# Patient Record
Sex: Female | Born: 1935 | Race: White | Hispanic: No | Smoking: Never smoker
Health system: Southern US, Community
[De-identification: ages and names within clinical notes are randomized; demographics above are authoritative.]

## PROBLEM LIST (undated history)

## (undated) DIAGNOSIS — R3915 Urgency of urination: Secondary | ICD-10-CM

## (undated) DIAGNOSIS — E079 Disorder of thyroid, unspecified: Secondary | ICD-10-CM

## (undated) DIAGNOSIS — M810 Age-related osteoporosis without current pathological fracture: Secondary | ICD-10-CM

## (undated) DIAGNOSIS — C801 Malignant (primary) neoplasm, unspecified: Secondary | ICD-10-CM

---

## 1995-11-29 HISTORY — PX: APPENDECTOMY: SHX54

## 1998-07-15 ENCOUNTER — Other Ambulatory Visit: Admission: RE | Admit: 1998-07-15 | Discharge: 1998-07-15 | Payer: Self-pay | Admitting: Obstetrics and Gynecology

## 1998-09-23 ENCOUNTER — Other Ambulatory Visit: Admission: RE | Admit: 1998-09-23 | Discharge: 1998-09-23 | Payer: Self-pay | Admitting: Obstetrics and Gynecology

## 1998-11-03 ENCOUNTER — Other Ambulatory Visit: Admission: RE | Admit: 1998-11-03 | Discharge: 1998-11-03 | Payer: Self-pay | Admitting: *Deleted

## 1998-11-28 DIAGNOSIS — C801 Malignant (primary) neoplasm, unspecified: Secondary | ICD-10-CM

## 1998-11-28 HISTORY — PX: TOTAL THYROIDECTOMY: SHX2547

## 1998-11-28 HISTORY — DX: Malignant (primary) neoplasm, unspecified: C80.1

## 1998-12-03 ENCOUNTER — Other Ambulatory Visit: Admission: RE | Admit: 1998-12-03 | Discharge: 1998-12-03 | Payer: Self-pay | Admitting: Endocrinology

## 1998-12-31 ENCOUNTER — Inpatient Hospital Stay (HOSPITAL_COMMUNITY): Admission: RE | Admit: 1998-12-31 | Discharge: 1999-01-02 | Payer: Self-pay | Admitting: Obstetrics and Gynecology

## 1999-03-01 ENCOUNTER — Ambulatory Visit (HOSPITAL_COMMUNITY): Admission: RE | Admit: 1999-03-01 | Discharge: 1999-03-01 | Payer: Self-pay | Admitting: Endocrinology

## 1999-03-05 ENCOUNTER — Encounter: Payer: Self-pay | Admitting: Endocrinology

## 1999-03-05 ENCOUNTER — Ambulatory Visit (HOSPITAL_COMMUNITY): Admission: RE | Admit: 1999-03-05 | Discharge: 1999-03-05 | Payer: Self-pay | Admitting: Endocrinology

## 1999-04-13 ENCOUNTER — Ambulatory Visit (HOSPITAL_COMMUNITY): Admission: RE | Admit: 1999-04-13 | Discharge: 1999-04-13 | Payer: Self-pay | Admitting: Endocrinology

## 1999-04-13 ENCOUNTER — Encounter: Payer: Self-pay | Admitting: Endocrinology

## 2001-11-07 ENCOUNTER — Other Ambulatory Visit: Admission: RE | Admit: 2001-11-07 | Discharge: 2001-11-07 | Payer: Self-pay | Admitting: Obstetrics and Gynecology

## 2002-11-14 ENCOUNTER — Other Ambulatory Visit: Admission: RE | Admit: 2002-11-14 | Discharge: 2002-11-14 | Payer: Self-pay | Admitting: Obstetrics and Gynecology

## 2003-12-04 ENCOUNTER — Other Ambulatory Visit: Admission: RE | Admit: 2003-12-04 | Discharge: 2003-12-04 | Payer: Self-pay | Admitting: Obstetrics and Gynecology

## 2004-05-11 ENCOUNTER — Ambulatory Visit (HOSPITAL_COMMUNITY): Admission: RE | Admit: 2004-05-11 | Discharge: 2004-05-11 | Payer: Self-pay | Admitting: Internal Medicine

## 2004-12-15 ENCOUNTER — Ambulatory Visit: Payer: Self-pay | Admitting: Internal Medicine

## 2005-01-27 ENCOUNTER — Ambulatory Visit: Payer: Self-pay | Admitting: Internal Medicine

## 2005-09-23 ENCOUNTER — Ambulatory Visit: Payer: Self-pay | Admitting: Internal Medicine

## 2006-01-16 ENCOUNTER — Other Ambulatory Visit: Admission: RE | Admit: 2006-01-16 | Discharge: 2006-01-16 | Payer: Self-pay | Admitting: Obstetrics and Gynecology

## 2006-08-24 ENCOUNTER — Ambulatory Visit: Payer: Self-pay | Admitting: Internal Medicine

## 2008-10-11 ENCOUNTER — Ambulatory Visit (HOSPITAL_COMMUNITY): Admission: RE | Admit: 2008-10-11 | Discharge: 2008-10-11 | Payer: Self-pay | Admitting: Orthopedic Surgery

## 2008-10-11 ENCOUNTER — Ambulatory Visit: Payer: Self-pay | Admitting: *Deleted

## 2008-10-11 ENCOUNTER — Encounter (INDEPENDENT_AMBULATORY_CARE_PROVIDER_SITE_OTHER): Payer: Self-pay | Admitting: Orthopedic Surgery

## 2009-07-21 ENCOUNTER — Ambulatory Visit: Payer: Self-pay | Admitting: Vascular Surgery

## 2009-12-30 DIAGNOSIS — E785 Hyperlipidemia, unspecified: Secondary | ICD-10-CM | POA: Diagnosis present

## 2009-12-30 DIAGNOSIS — C73 Malignant neoplasm of thyroid gland: Secondary | ICD-10-CM | POA: Insufficient documentation

## 2009-12-30 DIAGNOSIS — M810 Age-related osteoporosis without current pathological fracture: Secondary | ICD-10-CM | POA: Insufficient documentation

## 2009-12-30 DIAGNOSIS — E539 Vitamin B deficiency, unspecified: Secondary | ICD-10-CM | POA: Insufficient documentation

## 2010-01-19 DIAGNOSIS — E559 Vitamin D deficiency, unspecified: Secondary | ICD-10-CM | POA: Insufficient documentation

## 2010-07-26 DIAGNOSIS — G4700 Insomnia, unspecified: Secondary | ICD-10-CM | POA: Insufficient documentation

## 2011-04-12 NOTE — Procedures (Signed)
LOWER EXTREMITY VENOUS REFLUX EXAM   INDICATION:  Right lower extremity varicose vein with pain and swelling.   EXAM:  Using color-flow imaging and pulse Doppler spectral analysis, the  right common femoral vein, superficial femoral, popliteal, posterior  tibial, greater and lesser saphenous veins are evaluated.  There is  evidence suggesting deep venous insufficiency in the right lower  extremity.   The right  saphenofemoral junction is not competent.  The right  GSV is  not competent with the caliber as described below.   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   GSV Diameter (used if found to be incompetent only)                                            Right    Left  Proximal Greater Saphenous Vein           0.45. cm cm  Proximal-to-mid-thigh                     0.45 cm  cm  Mid thigh                                 0.48 cm  cm  Mid-distal thigh                          0.48 cm  cm  Distal thigh                              0.44 cm  cm  Knee                                      0.61 cm  cm   IMPRESSION:  1. Right  greater saphenous vein reflux is identified with the caliber      ranging from 0.61 cm to 0.66 cm knee to groin.  2. The right  greater saphenous vein is not aneurysmal.  3. The right/left greater saphenous vein is not tortuous.  4. The deep venous system is not competent.  5. 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  6. No evidence of deep venous thrombosis noted in the right leg.       ___________________________________________  Quita Skye. Hart Rochester, M.D.   MG/MEDQ  D:  07/21/2009  T:  07/21/2009  Job:  [id number]

## 2011-04-12 NOTE — Consult Note (Signed)
NEW PATIENT CONSULTATION   Jackie [name]  DOB:  Jul 20, 1932                                       07/21/2009  ZOXWR#:[id number]   The patient is a 77 year old female patient referred by Dr. Merla Riches  for painful varicosities in the right leg.  This lady has had prominent  varicosities for many years and has been having increasing  symptomatology in her right leg beginning in the hip and extending down  into the thigh and calf over the last several months.  She has been  treated for sciatica by Dr. Darrelyn Hillock with prednisone with some  improvement but continues to have a tight, throbbing, hot sensation in  the thigh and calf which worsens as the day progresses.  She has no  history of stasis ulcers, bleeding, thrombophlebitis or deep venous  thrombosis.  She does not wear elastic compression stockings nor elevate  the legs nor take pain medicine for this problem.   PAST MEDICAL HISTORY:  1. Hypertension.  2. Previous thyroid cancer treated by thyroidectomy.  3. Negative for diabetes, coronary artery disease, COPD,      hyperlipidemia or stroke.   PAST SURGICAL HISTORY:  1. Thyroidectomy.  2. Hysterectomy.  3. Bladder tack.   FAMILY HISTORY:  Positive for coronary artery disease and diabetes in a  brother.  Negative for stroke.   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  Does  not use tobacco or alcohol.   REVIEW OF SYSTEMS:  Please see health history form.   ALLERGY:  To erythromycin which causes nausea.   MEDICATIONS:  Please see health history form.   PHYSICAL EXAMINATION:  Vital signs:  Blood pressure is 130/80, heart  rate 70, respirations 14.  General:  She is a healthy-appearing female  in no apparent distress, alert and oriented x3.  Neck:  Is supple, 3+  carotid pulses palpable.  No bruits are audible.  Neurological:  Normal.  No palpable adenopathy in the neck.  Chest:  Clear to auscultation.  Cardiovascular:   Regular rhythm.  No murmurs.  Abdomen:  Soft, nontender  with no mass.  She has 3+ femoral, popliteal and posterior tibial pulses  bilaterally.  Right leg has severe varicosities beginning in the medial  thigh extending down into the medial calf and down into the ankle  medially and laterally with some thickening of the scan.  No edema is  noted.  No active ulcers are noted.  Left leg has some varicosities in  the lateral thigh which are much less severe extending down to the  knees.   Venous duplex exam reveals gross reflux of the right saphenofemoral  junction extending to the knee in the great saphenous vein with the deep  system being normal.   I think she is having painful varicosities from venous hypertension  secondary to valvular reflux in the right great saphenous vein.  We will  treat her with 3 months of long leg elastic compression (20 - 30 mm)  plus elevation of the legs as much as possible and ibuprofen on a daily  basis.  If she has had no improvement in 3 months I think she would  benefit from laser ablation of her right great saphenous vein and  multiple stab phlebectomies.  She will return in 3 months for her  followup.  Jackie [name], [name].D.  Electronically Signed   JDL/MEDQ  D:  07/21/2009  T:  07/22/2009  Job:  [id number]   cc:   Harrel Lemon. Merla Riches, [name].D.  Tera Mater. Evlyn Kanner, [name].D.

## 2011-04-15 NOTE — Assessment & Plan Note (Signed)
[facility]                               PULMONARY OFFICE NOTE   NAME:Borton, KARISSA MEENAN             MRN:          [id number]  DATE:01/27/2005                            DOB:          Mar 28, 1932    PROBLEM:  1. Allergic rhinitis.  2. Bronchiectasis.  3. Positive ANA.  4. Thyroid cancer surgery.   HISTORY:  One year followup for this never smoker who says she had 1 episode  of bronchitis during the past winter.  Dr. Evlyn Kanner did a chest x-ray in July  and mentioned a stable nodule.  We reviewed the film report here from  09/23/2005 which describes stable diffuse peribronchial thickening with  particular scarring in the region of the left lingula and right middle lobe.  There was no specific mention of a nodule.  She is going to check with Dr.  Evlyn Kanner.  She takes TUMS before bed but usually feels no reflux.  She did have  1 episode of bad heartburn 3 weeks ago and I discussed reflux precautions in  the role of reflux and lung disease.  She walks regularly and denies routine  productive cough, chest pain or fever.   MEDICATION:  1. Synthroid 125.  2. Vytorin 10/20.  3. Fosamax 70 mg.  4. Caltrate with vitamin D.   Drug intolerance to ERYTHROMYCIN.   OBJECTIVE:  Weight 148 pounds.  Blood pressure 128/82.  Pulse regular 64.  Room air saturation 98%.  I find no adenopathy. No neck vein distention or peripheral edema.  No  clubbing or cyanosis.  Her chest is quiet.  I specifically do not hear rales, rhonchi, or any  congestion today.  HEART:  Sounds are regular without murmur.   IMPRESSION:  There is some chronic bronchitis and we have felt clinically in  the past that she had bronchiectasis, but she seems stable now.  I am  concerned if she is having enough reflux to recognize and I explained how  this might aggravate her lung disease.   PLAN:  1. Reflux precautions.  2. I have asked her to see if we can compare Dr. Rinaldo Cloud chest x-ray  with      the one done here      last year.  3. Schedule return in 1 year at her request, earlier as needed.       Clinton D. Maple Hudson, MD, FCCP, FACP      CDY/MedQ  DD:  08/27/2006  DT:  08/28/2006  Job #:  [id number]   cc:   Tera Mater. Evlyn Kanner, M.D.

## 2014-03-09 ENCOUNTER — Emergency Department (HOSPITAL_BASED_OUTPATIENT_CLINIC_OR_DEPARTMENT_OTHER): Payer: Medicare Other

## 2014-03-09 ENCOUNTER — Encounter (HOSPITAL_BASED_OUTPATIENT_CLINIC_OR_DEPARTMENT_OTHER): Payer: Self-pay | Admitting: Emergency Medicine

## 2014-03-09 ENCOUNTER — Emergency Department (HOSPITAL_BASED_OUTPATIENT_CLINIC_OR_DEPARTMENT_OTHER)
Admission: EM | Admit: 2014-03-09 | Discharge: 2014-03-10 | Disposition: A | Discharge status: Home / Self Care | Payer: Medicare Other | Attending: Emergency Medicine | Admitting: Emergency Medicine

## 2014-03-09 DIAGNOSIS — Y92009 Unspecified place in unspecified non-institutional (private) residence as the place of occurrence of the external cause: Secondary | ICD-10-CM | POA: Insufficient documentation

## 2014-03-09 DIAGNOSIS — Z859 Personal history of malignant neoplasm, unspecified: Secondary | ICD-10-CM | POA: Insufficient documentation

## 2014-03-09 DIAGNOSIS — S22009A Unspecified fracture of unspecified thoracic vertebra, initial encounter for closed fracture: Secondary | ICD-10-CM | POA: Insufficient documentation

## 2014-03-09 DIAGNOSIS — Y93E5 Activity, floor mopping and cleaning: Secondary | ICD-10-CM | POA: Insufficient documentation

## 2014-03-09 DIAGNOSIS — E079 Disorder of thyroid, unspecified: Secondary | ICD-10-CM | POA: Insufficient documentation

## 2014-03-09 DIAGNOSIS — W010XXA Fall on same level from slipping, tripping and stumbling without subsequent striking against object, initial encounter: Secondary | ICD-10-CM | POA: Insufficient documentation

## 2014-03-09 DIAGNOSIS — G319 Degenerative disease of nervous system, unspecified: Secondary | ICD-10-CM | POA: Insufficient documentation

## 2014-03-09 DIAGNOSIS — Z79899 Other long term (current) drug therapy: Secondary | ICD-10-CM | POA: Insufficient documentation

## 2014-03-09 DIAGNOSIS — Z7982 Long term (current) use of aspirin: Secondary | ICD-10-CM | POA: Insufficient documentation

## 2014-03-09 HISTORY — DX: Disorder of thyroid, unspecified: E07.9

## 2014-03-09 HISTORY — DX: Malignant (primary) neoplasm, unspecified: C80.1

## 2014-03-09 NOTE — ED Notes (Addendum)
Pt reports she tripped and fell while sweeping floor pt c/o left shoulder and rib area pain

## 2014-03-09 NOTE — ED Provider Notes (Signed)
CSN: [id number]     Arrival date & time 03/09/14  2242 History  This chart was scribed for Mattea Seger Alfonso Patten, MD by Marcha Dutton, ED Scribe. This patient was seen in room MH04/MH04 and the patient's care was started at 12:13 AM.    Chief Complaint  Patient presents with  . Fall      Patient is a 80 y.o. female presenting with fall. The history is provided by the patient. No language interpreter was used.  Fall This is a new problem. The current episode started 3 to 5 hours ago. The problem occurs constantly. The problem has not changed since onset.Pertinent negatives include no chest pain, no abdominal pain, no headaches and no shortness of breath. Nothing aggravates the symptoms. Nothing relieves the symptoms. Treatments tried: ibuprofen. The treatment provided mild relief.   HPI Comments: Jackie [name] is a 80 y.o. female who presents to the Emergency Department complaining of left shoulder pain after a fall while she was cleaning her floor this evening 5 hours ago. She states she impacted her right  Back lateral to her spine at. Pt reports she took two advil to relieve her pain which is mildly improving.  Past Medical History  Diagnosis Date  . Thyroid disease   . Cancer    Past Surgical History  Procedure Laterality Date  . Total thyroidectomy     History reviewed. No pertinent family history. History  Substance Use Topics  . Smoking status: Never Smoker   . Smokeless tobacco: Not on file  . Alcohol Use: No   OB History   Grav Para Term Preterm Abortions TAB SAB Ect Mult Living                 Review of Systems  Respiratory: Negative for shortness of breath.   Cardiovascular: Negative for chest pain.  Gastrointestinal: Negative for abdominal pain.  Musculoskeletal: Positive for arthralgias (right shoulder).  Neurological: Negative for headaches.  All other systems reviewed and are negative.     Allergies  Zithromax  Home Medications   Current  Outpatient Rx  Name  Route  Sig  Dispense  Refill  . aspirin 81 MG tablet   Oral   Take 81 mg by mouth daily.         Marland Kitchen levothyroxine (SYNTHROID, LEVOTHROID) 112 MCG tablet   Oral   Take 112 mcg by mouth daily before breakfast.         . Multiple Vitamins-Minerals (MULTIVITAMIN WITH MINERALS) tablet   Oral   Take 1 tablet by mouth daily.         . rosuvastatin (CRESTOR) 5 MG tablet   Oral   Take 5 mg by mouth daily.          Triage Vitals: BP 193/86  Pulse 80  Temp(Src) 97.8 F (36.6 C) (Oral)  Resp 20  Ht 5' 3.25" (1.607 m)  Wt 125 lb (56.7 kg)  BMI 21.96 kg/m2  SpO2 98%  Physical Exam  Nursing note and vitals reviewed. Constitutional: She is oriented to person, place, and time. She appears well-developed and well-nourished. No distress.  HENT:  Head: Normocephalic and atraumatic. Head is without raccoon's eyes and without Battle's sign.  Right Ear: Hearing, tympanic membrane and external ear normal. No hemotympanum.  Left Ear: Hearing, tympanic membrane and external ear normal. No hemotympanum.  Mouth/Throat: Uvula is midline, oropharynx is clear and moist and mucous membranes are normal. Mucous membranes are not dry.  Eyes: Conjunctivae are  normal. Pupils are equal, round, and reactive to light. Right eye exhibits no discharge. Left eye exhibits no discharge. No scleral icterus.  Neck: Normal range of motion. Neck supple. No tracheal deviation present. No thyromegaly present.  Cardiovascular: Normal rate, regular rhythm and intact distal pulses.   Pulmonary/Chest: Effort normal and breath sounds normal. No stridor. No respiratory distress. She has no wheezes. She has no rales. She exhibits no crepitus.  Abdominal: Soft. Bowel sounds are normal. She exhibits no distension. There is no tenderness. There is no rebound and no guarding.  Musculoskeletal: Normal range of motion. She exhibits no edema and no tenderness.  Hematoma by the right scapula, no winging of the  scapula.  no step offs, no crepitance, or tenderness of the c t or lpsine.  shoulders are seated in the glenoids.  Intact sensation to all extremities.  Lymphadenopathy:    She has no cervical adenopathy.  Neurological: She is alert and oriented to person, place, and time. She has normal strength and normal reflexes. No cranial nerve deficit (no facial droop, extraocular movements intact, no slurred speech) or sensory deficit. She exhibits normal muscle tone. She displays no seizure activity. Coordination normal.  No pint tenderness of the CTor L spine.  5/5 x all 4 extremities  Skin: Skin is warm and dry. No rash noted.  Psychiatric: She has a normal mood and affect.    ED Course  Procedures (including critical care time)  DIAGNOSTIC STUDIES: Oxygen Saturation is 98% on RA, normal by my interpretation.    COORDINATION OF CARE: 12:18 AM- Pt advised of plan for treatment and pt agrees.    Labs Review Labs Reviewed - No data to display Imaging Review No results found.   EKG Interpretation None      MDM   Final diagnoses:  None   130 am case d/w Dr. Vertell Limber via phone who reviewed all films.  Please place in vista collar and refer to office to be seen early this week.  No additional films necessary at this time  Collar placed.  Neuro intact pre and post placement.  No driving.  No removing collar. No lifting, no bending.  Take pain medication return to the closest ED immediately for weakness numbness or any concerns. All to be seen by Dr. Vertell Limber.  > 15 minutes spent answering questions for patient and family.  Patient and children verbalize understanding of all instructions and agree to follow up  I personally performed the services described in this documentation, which was scribed in my presence. The recorded information has been reviewed and is accurate.    Carlisle Beers, MD 03/10/14 [id number]

## 2014-03-10 ENCOUNTER — Encounter (HOSPITAL_BASED_OUTPATIENT_CLINIC_OR_DEPARTMENT_OTHER): Payer: Self-pay | Admitting: Emergency Medicine

## 2014-03-10 MED ORDER — TRAMADOL HCL 50 MG PO TABS: 50.0000 mg | ORAL_TABLET | Freq: Four times a day (QID) | ORAL | Status: DC | PRN

## 2014-03-10 MED ORDER — ACETAMINOPHEN 500 MG PO TABS
1000.0000 mg | ORAL_TABLET | Freq: Once | ORAL | Status: AC
  Administered 2014-03-10: 1000 mg via ORAL
  Filled 2014-03-10: qty 2

## 2014-03-10 NOTE — Discharge Instructions (Signed)
Transverse Process Fracture A fracture of a bone is the same as a break in the bone. A fracture of a transverse process is a break of a part of one of the bones in the spine. This part extends out from the side of the main body of the bone (called the vertebral body). A transverse process is shaped like a wing. They extend from both the left and right sides of the vertebral body. Many of these injuries occur in the thoracic spine (the upper and middle parts of the back) and the lumbar spine (the low back area). In the elderly, these injuries can also occur in the lower neck area and are affected by age-related arthritis problems and osteoporosis (thinning of bone). It takes a lot of force to cause this type of fracture. Because other organs and so many other parts of the spine are close to the transverse processes, these fractures usually occur at the same time as injuries to:  Other bones.  Organs.  Possibly the spinal cord. Even if there is only a break to one transverse process, your caregiver will take steps to make sure that a nearby organ has not also been injured.  CAUSES  Most of these injuries occur as a result of a variety of accidents such as:  Falls.  Motor vehicle accidents.  Recreational activities.  A smaller number occur due to:  Industrial, Engineer, maintenance, and aviation accidents.  Gunshot wounds and direct blows to the back.  Parachuting incidents. SYMPTOMS  Patients with transverse process fractures have severe pain even if the actual break is small or limited and there is no injury to nearby bones, organs, or the spinal cord. More severe or complex injuries involving other bones and/or organs may include:  Deformity of the back bones.  Swelling/bruising over the injured area.  Limited ability to move the affected area.  There may be injury to a nearby:  Lung.  Kidney.  Spleen.  Liver. Injury to nearby nerves can cause partial or complete loss of  function of the bladder and/or bowels. More severe injuries can also cause:  Loss of sensation and/or strength in the arms/hands (if the break is in the lower part of the neck), legs, feet, and toes.  Spinal cord injury that leads to paralysis. DIAGNOSIS In most cases, a broken bone will be suspected by what happened just prior to the onset of back and/or neck pain. X-rays and special imaging (CT scan and MRI imaging) are used to confirm the diagnosis as well as finding out the type and severity of the break or breaks. These tests are important for guiding and planning treatment. There are times when special imaging cannot be done. MRI cannot be done if there is an implanted metallic device (such as a pacemaker). In these cases, other tests and imaging are done. Other tests may be done if your caregiver is concerned about injuries to internal organs near the break of the transverse process. For example, an ultrasound may be ordered to examine the liver, spleen, or kidneys. If there has been nerve damage near the break, additional tests may be ordered in order to find out:  Exactly how much damage has occurred.  To plan for what can be done to help. These include:  Tests of nerve function through muscles (nerve conduction studies and electromyography).  Tests of bladder function (urodynamics).  Tests that focus on defining specific nerve problems before surgery and what improvement has come about after surgery (evoked potentials). TREATMENT  If the injury is limited to a break of a transverse process with no other bone or organ injury, hospital care may not be necessary. Medication for pain control, special back bracing, and limitations in activity are done first followed by physical therapy later. Complex breaks, multiple fractures of the spine, or unstable injuries can damage the spinal cord and may require an operation to remove pressure from the nerves and/or spinal cord and to stabilize the  broken pieces of bone. Specialty care may be needed if there has been injury to an internal organ near a broken transverse process.Each individual set of injuries is unique, and your caregiver will review many things when planning the best approach that will give the highest likelihood of a good outcome.  HOME CARE INSTRUCTIONS There is pain and stiffness in the back for weeks after a transverse process fracture. Bed rest, pain medicine, and a slow return to activity are generally recommended. Neck and back braces may be helpful in reducing pain and increasing mobility. When your pain allows, simple walking will help to begin the process of returning to normal activities. Exercises to improve motion and to strengthen the back may also be useful after the initial pain goes away. This will be guided by your caregiver and the team (nurses, physical therapists, occupational therapists, etc.) involved with your ongoing care. For the elderly, treatment for osteoporosis may be essential to help reduce your risk of fractures in the future. Arrange for follow-up care as recommended to assure proper long-term care and prevention of further spine injury. It is important that you participate in your return to good health. The failure to follow up as recommended with your caregiver, orthopedic referral or other provider may result in the bone not healing properly with possible chronic disability or pain. SEEK MEDICAL CARE IF:  Pain is not effectively controlled with medication.  You feel unable to decrease pain medication over time as planned.  Activity level is not improving as planned and/or expected. SEEK IMMEDIATE MEDICAL CARE IF:  You have increasing pain, vomiting, or are unable to move around at all.  You have numbness, tingling, weakness, or paralysis of any part of your body.  You have loss of normal bowel or bladder control.  You have difficulty breathing, cough, fever, chest or abdominal pain. MAKE  SURE YOU:   Understand these instructions.  Will watch your condition.  Will get help right away if you are not doing well or get worse. Document Released: 03/01/2007 Document Revised: 02/06/2012 Document Reviewed: 10/30/2007 [facility] Patient Information 2014 [place], [place].

## 2014-03-10 NOTE — ED Notes (Signed)
Report given to B. Brooks, RN. 

## 2014-04-13 ENCOUNTER — Encounter (HOSPITAL_BASED_OUTPATIENT_CLINIC_OR_DEPARTMENT_OTHER): Payer: Self-pay | Admitting: Emergency Medicine

## 2014-04-13 ENCOUNTER — Emergency Department (HOSPITAL_BASED_OUTPATIENT_CLINIC_OR_DEPARTMENT_OTHER): Payer: Medicare Other

## 2014-04-13 ENCOUNTER — Emergency Department (HOSPITAL_BASED_OUTPATIENT_CLINIC_OR_DEPARTMENT_OTHER)
Admission: EM | Admit: 2014-04-13 | Discharge: 2014-04-13 | Disposition: A | Discharge status: Home / Self Care | Payer: Medicare Other | Attending: Emergency Medicine | Admitting: Emergency Medicine

## 2014-04-13 DIAGNOSIS — Z7982 Long term (current) use of aspirin: Secondary | ICD-10-CM | POA: Insufficient documentation

## 2014-04-13 DIAGNOSIS — Z859 Personal history of malignant neoplasm, unspecified: Secondary | ICD-10-CM | POA: Insufficient documentation

## 2014-04-13 DIAGNOSIS — E079 Disorder of thyroid, unspecified: Secondary | ICD-10-CM | POA: Insufficient documentation

## 2014-04-13 DIAGNOSIS — Z8739 Personal history of other diseases of the musculoskeletal system and connective tissue: Secondary | ICD-10-CM | POA: Insufficient documentation

## 2014-04-13 DIAGNOSIS — R1031 Right lower quadrant pain: Secondary | ICD-10-CM

## 2014-04-13 DIAGNOSIS — Z79899 Other long term (current) drug therapy: Secondary | ICD-10-CM | POA: Insufficient documentation

## 2014-04-13 DIAGNOSIS — R109 Unspecified abdominal pain: Secondary | ICD-10-CM | POA: Insufficient documentation

## 2014-04-13 NOTE — ED Notes (Signed)
Patient c/o right hip pain. States she was getting out of a chair Wednesday and thinks she pulled a muscle.

## 2014-04-13 NOTE — Discharge Instructions (Signed)
Hip Pain  The hips join the upper legs to the lower pelvis. The bones, cartilage, tendons, and muscles of the hip joint perform a lot of work each day holding your body weight and allowing you to move around.  Hip pain is a common symptom. It can range from a minor ache to severe pain on 1 or both hips. Pain may be felt on the inside of the hip joint near the groin, or the outside near the buttocks and upper thigh. There may be swelling or stiffness as well. It occurs more often when a person walks or performs activity. There are many reasons hip pain can develop.  CAUSES   It is important to work with your caregiver to identify the cause since many conditions can impact the bones, cartilage, muscles, and tendons of the hips. Causes for hip pain include:   Broken (fractured) bones.   Separation of the thighbone from the hip socket (dislocation).   Torn cartilage of the hip joint.   Swelling (inflammation) of a tendon (tendonitis), the sac within the hip joint (bursitis), or a joint.   A weakening in the abdominal wall (hernia), affecting the nerves to the hip.   Arthritis in the hip joint or lining of the hip joint.   Pinched nerves in the back, hip, or upper thigh.   A bulging disc in the spine (herniated disc).   Rarely, bone infection or cancer.  DIAGNOSIS   The location of your hip pain will help your caregiver understand what may be causing the pain. A diagnosis is based on your medical history, your symptoms, results from your physical exam, and results from diagnostic tests. Diagnostic tests may include X-Glynna Failla exams, a computerized magnetic scan (magnetic resonance imaging, MRI), or bone scan.  TREATMENT   Treatment will depend on the cause of your hip pain. Treatment may include:   Limiting activities and resting until symptoms improve.   Crutches or other walking supports (a cane or brace).   Ice, elevation, and compression.   Physical therapy or home exercises.   Shoe inserts or special  shoes.   Losing weight.   Medications to reduce pain.   Undergoing surgery.  HOME CARE INSTRUCTIONS    Only take over-the-counter or prescription medicines for pain, discomfort, or fever as directed by your caregiver.   Put ice on the injured area:   Put ice in a plastic bag.   Place a towel between your skin and the bag.   Leave the ice on for 15-20 minutes at a time, 03-04 times a day.   Keep your leg raised (elevated) when possible to lessen swelling.   Avoid activities that cause pain.   Follow specific exercises as directed by your caregiver.   Sleep with a pillow between your legs on your most comfortable side.   Record how often you have hip pain, the location of the pain, and what it feels like. This information may be helpful to you and your caregiver.   Ask your caregiver about returning to work or sports and whether you should drive.   Follow up with your caregiver for further exams, therapy, or testing as directed.  SEEK MEDICAL CARE IF:    Your pain or swelling continues or worsens after 1 week.   You are feeling unwell or have chills.   You have increasing difficulty with walking.   You have a loss of sensation or other new symptoms.   You have questions or concerns.  SEEK   IMMEDIATE MEDICAL CARE IF:    You cannot put weight on the affected hip.   You have fallen.   You have a sudden increase in pain and swelling in your hip.   You have a fever.  MAKE SURE YOU:    Understand these instructions.   Will watch your condition.   Will get help right away if you are not doing well or get worse.  Document Released: 05/04/2010 Document Revised: 02/06/2012 Document Reviewed: 05/04/2010  [employer] Patient Information 2014 [employer]

## 2014-04-14 ENCOUNTER — Other Ambulatory Visit: Payer: Self-pay | Admitting: *Deleted

## 2014-04-14 DIAGNOSIS — R1030 Lower abdominal pain, unspecified: Secondary | ICD-10-CM

## 2014-04-14 DIAGNOSIS — M25559 Pain in unspecified hip: Secondary | ICD-10-CM

## 2014-04-14 NOTE — ED Provider Notes (Signed)
CSN: [id number]     Arrival date & time 04/13/14  1124 History   First MD Initiated Contact with Patient 04/13/14 1131     Chief Complaint  Patient presents with  . Hip Pain     (Consider location/radiation/quality/duration/timing/severity/associated sxs/prior Treatment) HPI 80 y.o old female with right groin pain that began two days ago with getting out of a recliner.  She did not fall or step hard on it.  She has pain which increases with weight bearing ,but she has a walker and has been able to walk with this.  She has spoken with her physician's on call provider and has been using nsaids and acetaminophen with some relief.  She wishes to be assessed for fracture as she has some history of osteopenia with a previous thoracic lumbar fracture.  Past Medical History  Diagnosis Date  . Thyroid disease   . Cancer    Past Surgical History  Procedure Laterality Date  . Total thyroidectomy     No family history on file. History  Substance Use Topics  . Smoking status: Never Smoker   . Smokeless tobacco: Not on file  . Alcohol Use: No   OB History   Grav Para Term Preterm Abortions TAB SAB Ect Mult Living                 Review of Systems  All other systems reviewed and are negative.     Allergies  Zithromax  Home Medications   Prior to Admission medications   Medication Sig Start Date End Date Taking? Authorizing Provider  aspirin 81 MG tablet Take 81 mg by mouth daily.    Historical Provider, MD  levothyroxine (SYNTHROID, LEVOTHROID) 112 MCG tablet Take 112 mcg by mouth daily before breakfast.    Historical Provider, MD  Multiple Vitamins-Minerals (MULTIVITAMIN WITH MINERALS) tablet Take 1 tablet by mouth daily.    Historical Provider, MD  rosuvastatin (CRESTOR) 5 MG tablet Take 5 mg by mouth daily.    Historical Provider, MD  traMADol (ULTRAM) 50 MG tablet Take 1 tablet (50 mg total) by mouth every 6 (six) hours as needed. 03/10/14   April K Palumbo-Rasch, MD   BP  177/87  Pulse 64  Temp(Src) 97.7 F (36.5 C) (Oral)  Resp 18  SpO2 99% Physical Exam  Nursing note and vitals reviewed. Constitutional: She is oriented to person, place, and time. She appears well-developed and well-nourished.  HENT:  Head: Normocephalic and atraumatic.  Right Ear: Tympanic membrane and external ear normal.  Left Ear: Tympanic membrane and external ear normal.  Nose: Nose normal. Right sinus exhibits no maxillary sinus tenderness and no frontal sinus tenderness. Left sinus exhibits no maxillary sinus tenderness and no frontal sinus tenderness.  Eyes: Conjunctivae and EOM are normal. Pupils are equal, round, and reactive to light. Right eye exhibits no nystagmus. Left eye exhibits no nystagmus.  Neck: Normal range of motion. Neck supple.  Cardiovascular: Normal rate, regular rhythm, normal heart sounds and intact distal pulses.   Pulmonary/Chest: Effort normal and breath sounds normal. No respiratory distress. She exhibits no tenderness.  Abdominal: Soft. Bowel sounds are normal. She exhibits no distension and no mass. There is no tenderness.  Musculoskeletal: Normal range of motion. She exhibits no edema and no tenderness.       Legs: No lateral hip ttp, hip actively ranged without pain.   Neurological: She is alert and oriented to person, place, and time. She has normal strength and normal reflexes. No sensory  deficit. She displays a negative Romberg sign. GCS eye subscore is 4. GCS verbal subscore is 5. GCS motor subscore is 6.  Reflex Scores:      Tricep reflexes are 2+ on the right side and 2+ on the left side.      Bicep reflexes are 2+ on the right side and 2+ on the left side.      Brachioradialis reflexes are 2+ on the right side and 2+ on the left side.      Patellar reflexes are 2+ on the right side and 2+ on the left side.      Achilles reflexes are 2+ on the right side and 2+ on the left side. Speech is normal without dysarthria, dysphasia, or  aphasia. Muscle strength is 5/5 in bilateral shoulders, elbow flexor and extensors, wrist flexor and extensors, and intrinsic hand muscles. 5/5 bilateral lower extremity hip flexors, extensors, knee flexors and extensors, and ankle dorsi and plantar flexors.    Skin: Skin is warm and dry. No rash noted.  Psychiatric: She has a normal mood and affect. Her behavior is normal. Judgment and thought content normal.    ED Course  Procedures (including critical care time) Labs Review Labs Reviewed - No data to display  Imaging Review Dg Hip Complete Right  04/13/2014   CLINICAL DATA:  Right hip pain  EXAM: RIGHT HIP - COMPLETE 2+ VIEW  COMPARISON:  None.  FINDINGS: No acute fracture.  No dislocation.  Unremarkable soft tissues.  IMPRESSION: No acute bony pathology.   Electronically Signed   By: Maryclare Bean M.D.   On: 04/13/2014 12:20     EKG Interpretation None      MDM   Final diagnoses:  Right groin pain   Patient without evidence of acute fracture.  I have discussed the possibility of a missed fracture given osteopenia and need for close follow up and possible mri.  She is walking and bearing weight , although painful, and is discharge home to use home walker/ wheelchair, continue conservative treatment, and follow up with her pmd.  She and daughter voice understanding of plan and return precautions.     Shaune Pollack, MD 04/14/14 1120

## 2014-04-16 ENCOUNTER — Ambulatory Visit
Admission: RE | Admit: 2014-04-16 | Discharge: 2014-04-16 | Disposition: A | Discharge status: Home / Self Care | Payer: Medicare Other | Source: Ambulatory Visit | Attending: Endocrinology | Admitting: Endocrinology

## 2014-04-16 DIAGNOSIS — R1030 Lower abdominal pain, unspecified: Secondary | ICD-10-CM

## 2014-04-16 DIAGNOSIS — M25559 Pain in unspecified hip: Secondary | ICD-10-CM

## 2014-04-16 MED ORDER — IOHEXOL 300 MG/ML  SOLN
100.0000 mL | Freq: Once | INTRAMUSCULAR | Status: AC | PRN
  Administered 2014-04-16: 100 mL via INTRAVENOUS

## 2014-05-14 DIAGNOSIS — R7301 Impaired fasting glucose: Secondary | ICD-10-CM | POA: Insufficient documentation

## 2014-09-23 ENCOUNTER — Encounter (HOSPITAL_COMMUNITY): Payer: Medicare Other

## 2014-09-25 ENCOUNTER — Ambulatory Visit (HOSPITAL_COMMUNITY): Payer: Medicare Other

## 2014-10-11 ENCOUNTER — Emergency Department (HOSPITAL_COMMUNITY): Payer: Medicare Other

## 2014-10-11 ENCOUNTER — Inpatient Hospital Stay (HOSPITAL_COMMUNITY)
Admission: EM | Admit: 2014-10-11 | Discharge: 2014-10-15 | DRG: 470 | Disposition: A | Discharge status: Skilled Nursing Facility | Payer: Medicare Other | Attending: Internal Medicine | Admitting: Internal Medicine

## 2014-10-11 ENCOUNTER — Encounter (HOSPITAL_COMMUNITY): Payer: Self-pay | Admitting: Nurse Practitioner

## 2014-10-11 DIAGNOSIS — E890 Postprocedural hypothyroidism: Secondary | ICD-10-CM | POA: Diagnosis present

## 2014-10-11 DIAGNOSIS — S72002A Fracture of unspecified part of neck of left femur, initial encounter for closed fracture: Secondary | ICD-10-CM | POA: Diagnosis present

## 2014-10-11 DIAGNOSIS — D62 Acute posthemorrhagic anemia: Secondary | ICD-10-CM | POA: Diagnosis not present

## 2014-10-11 DIAGNOSIS — Z7982 Long term (current) use of aspirin: Secondary | ICD-10-CM | POA: Diagnosis not present

## 2014-10-11 DIAGNOSIS — Z791 Long term (current) use of non-steroidal anti-inflammatories (NSAID): Secondary | ICD-10-CM | POA: Diagnosis not present

## 2014-10-11 DIAGNOSIS — Z85850 Personal history of malignant neoplasm of thyroid: Secondary | ICD-10-CM | POA: Diagnosis not present

## 2014-10-11 DIAGNOSIS — K225 Diverticulum of esophagus, acquired: Secondary | ICD-10-CM | POA: Diagnosis present

## 2014-10-11 DIAGNOSIS — Y92019 Unspecified place in single-family (private) house as the place of occurrence of the external cause: Secondary | ICD-10-CM

## 2014-10-11 DIAGNOSIS — Z79899 Other long term (current) drug therapy: Secondary | ICD-10-CM | POA: Diagnosis not present

## 2014-10-11 DIAGNOSIS — W010XXA Fall on same level from slipping, tripping and stumbling without subsequent striking against object, initial encounter: Secondary | ICD-10-CM | POA: Diagnosis present

## 2014-10-11 DIAGNOSIS — R1310 Dysphagia, unspecified: Secondary | ICD-10-CM

## 2014-10-11 DIAGNOSIS — R1319 Other dysphagia: Secondary | ICD-10-CM | POA: Diagnosis present

## 2014-10-11 DIAGNOSIS — S72009A Fracture of unspecified part of neck of unspecified femur, initial encounter for closed fracture: Secondary | ICD-10-CM | POA: Diagnosis present

## 2014-10-11 DIAGNOSIS — M25552 Pain in left hip: Secondary | ICD-10-CM | POA: Diagnosis present

## 2014-10-11 DIAGNOSIS — Y836 Removal of other organ (partial) (total) as the cause of abnormal reaction of the patient, or of later complication, without mention of misadventure at the time of the procedure: Secondary | ICD-10-CM | POA: Diagnosis present

## 2014-10-11 LAB — CBC WITH DIFFERENTIAL/PLATELET
Basophils Absolute: 0 10*3/uL (ref 0.0–0.1)
Basophils Relative: 0 % (ref 0–1)
Eosinophils Absolute: 0 10*3/uL (ref 0.0–0.7)
Eosinophils Relative: 0 % (ref 0–5)
HCT: 38 % (ref 36.0–46.0)
Hemoglobin: 12.4 g/dL (ref 12.0–15.0)
Lymphocytes Relative: 3 % — ABNORMAL LOW (ref 12–46)
Lymphs Abs: 0.3 10*3/uL — ABNORMAL LOW (ref 0.7–4.0)
MCH: 29.5 pg (ref 26.0–34.0)
MCHC: 32.6 g/dL (ref 30.0–36.0)
MCV: 90.3 fL (ref 78.0–100.0)
Monocytes Absolute: 0.7 10*3/uL (ref 0.1–1.0)
Monocytes Relative: 5 % (ref 3–12)
Neutro Abs: 11.4 10*3/uL — ABNORMAL HIGH (ref 1.7–7.7)
Neutrophils Relative %: 92 % — ABNORMAL HIGH (ref 43–77)
Platelets: 274 10*3/uL (ref 150–400)
RBC: 4.21 MIL/uL (ref 3.87–5.11)
RDW: 14.1 % (ref 11.5–15.5)
WBC: 12.4 10*3/uL — ABNORMAL HIGH (ref 4.0–10.5)

## 2014-10-11 LAB — BASIC METABOLIC PANEL
Anion gap: 14 (ref 5–15)
BUN: 21 mg/dL (ref 6–23)
CO2: 22 mEq/L (ref 19–32)
Calcium: 9.5 mg/dL (ref 8.4–10.5)
Chloride: 96 mEq/L (ref 96–112)
Creatinine, Ser: 0.69 mg/dL (ref 0.50–1.10)
GFR calc Af Amer: >90 mL/min (ref >90)
GFR calc non Af Amer: 80 mL/min — ABNORMAL LOW (ref >90)
Glucose, Bld: 160 mg/dL — ABNORMAL HIGH (ref 70–99)
Potassium: 3.9 mEq/L (ref 3.7–5.3)
Sodium: 132 mEq/L — ABNORMAL LOW (ref 137–147)

## 2014-10-11 LAB — PROTIME-INR
INR: 1.04 (ref 0.00–1.49)
Prothrombin Time: 13.7 seconds (ref 11.6–15.2)

## 2014-10-11 LAB — TSH: TSH: 0.063 u[IU]/mL — ABNORMAL LOW (ref 0.350–4.500)

## 2014-10-11 LAB — CBC
HCT: 34.3 % — ABNORMAL LOW (ref 36.0–46.0)
Hemoglobin: 11.5 g/dL — ABNORMAL LOW (ref 12.0–15.0)
MCH: 29.8 pg (ref 26.0–34.0)
MCHC: 33.5 g/dL (ref 30.0–36.0)
MCV: 88.9 fL (ref 78.0–100.0)
Platelets: 250 10*3/uL (ref 150–400)
RBC: 3.86 MIL/uL — ABNORMAL LOW (ref 3.87–5.11)
RDW: 14 % (ref 11.5–15.5)
WBC: 9.5 10*3/uL (ref 4.0–10.5)

## 2014-10-11 LAB — CREATININE, SERUM
Creatinine, Ser: 0.65 mg/dL (ref 0.50–1.10)
GFR calc Af Amer: >90 mL/min (ref >90)
GFR calc non Af Amer: 82 mL/min — ABNORMAL LOW (ref >90)

## 2014-10-11 LAB — TYPE AND SCREEN
ABO/RH(D): A POS
Antibody Screen: NEGATIVE

## 2014-10-11 LAB — TROPONIN I
Troponin I: <0.3 ng/mL (ref <0.30)
Troponin I: <0.3 ng/mL (ref <0.30)

## 2014-10-11 LAB — ABO/RH: ABO/RH(D): A POS

## 2014-10-11 MED ORDER — SODIUM CHLORIDE 0.9 % IJ SOLN
3.0000 mL | INTRAMUSCULAR | Status: DC | PRN
  Filled 2014-10-11: qty 3

## 2014-10-11 MED ORDER — BISACODYL 10 MG RE SUPP: 10.0000 mg | Freq: Every day | RECTAL | Status: DC | PRN

## 2014-10-11 MED ORDER — POLYETHYLENE GLYCOL 3350 17 G PO PACK
17.0000 g | PACK | Freq: Every day | ORAL | Status: DC | PRN
  Administered 2014-10-15: 17 g via ORAL
  Filled 2014-10-11 (×3): qty 1

## 2014-10-11 MED ORDER — FENTANYL CITRATE 0.05 MG/ML IJ SOLN
50.0000 ug | INTRAMUSCULAR | Status: AC | PRN
  Administered 2014-10-11 (×2): 50 ug via INTRAVENOUS
  Filled 2014-10-11 (×2): qty 2

## 2014-10-11 MED ORDER — ONDANSETRON HCL 4 MG PO TABS
4.0000 mg | ORAL_TABLET | Freq: Four times a day (QID) | ORAL | Status: DC | PRN
  Filled 2014-10-11: qty 1

## 2014-10-11 MED ORDER — SODIUM CHLORIDE 0.9 % IJ SOLN
3.0000 mL | Freq: Two times a day (BID) | INTRAMUSCULAR | Status: DC
  Administered 2014-10-11 – 2014-10-13 (×3): 3 mL via INTRAVENOUS

## 2014-10-11 MED ORDER — ACETAMINOPHEN 325 MG PO TABS
650.0000 mg | ORAL_TABLET | Freq: Four times a day (QID) | ORAL | Status: DC | PRN
Start: 2014-10-11 — End: 2014-10-15
  Administered 2014-10-11 – 2014-10-14 (×5): 650 mg via ORAL
  Filled 2014-10-11 (×4): qty 2

## 2014-10-11 MED ORDER — ACETAMINOPHEN 650 MG RE SUPP: 650.0000 mg | Freq: Four times a day (QID) | RECTAL | Status: DC | PRN

## 2014-10-11 MED ORDER — SODIUM CHLORIDE 0.9 % IV SOLN
250.0000 mL | INTRAVENOUS | Status: DC | PRN
Start: 2014-10-11 — End: 2014-10-15

## 2014-10-11 MED ORDER — OXYCODONE HCL 5 MG PO TABS
5.0000 mg | ORAL_TABLET | ORAL | Status: DC | PRN
  Administered 2014-10-11 – 2014-10-15 (×9): 5 mg via ORAL
  Filled 2014-10-11 (×10): qty 1

## 2014-10-11 MED ORDER — ENOXAPARIN SODIUM 40 MG/0.4ML ~~LOC~~ SOLN
40.0000 mg | SUBCUTANEOUS | Status: DC
  Administered 2014-10-11 – 2014-10-12 (×2): 40 mg via SUBCUTANEOUS
  Filled 2014-10-11 (×3): qty 0.4

## 2014-10-11 MED ORDER — ONDANSETRON HCL 4 MG/2ML IJ SOLN
4.0000 mg | Freq: Four times a day (QID) | INTRAMUSCULAR | Status: DC | PRN
  Administered 2014-10-11 – 2014-10-12 (×2): 4 mg via INTRAVENOUS
  Filled 2014-10-11: qty 2

## 2014-10-11 MED ORDER — LEVALBUTEROL HCL 0.63 MG/3ML IN NEBU
0.6300 mg | INHALATION_SOLUTION | Freq: Four times a day (QID) | RESPIRATORY_TRACT | Status: DC | PRN
  Filled 2014-10-11: qty 3

## 2014-10-11 MED ORDER — SODIUM CHLORIDE 0.9 % IJ SOLN
3.0000 mL | Freq: Two times a day (BID) | INTRAMUSCULAR | Status: DC
  Administered 2014-10-11 – 2014-10-13 (×2): 3 mL via INTRAVENOUS

## 2014-10-11 NOTE — ED Notes (Signed)
Attempted report 

## 2014-10-11 NOTE — ED Notes (Signed)
Per EMS pt was at home using walker, walker got stuck and patient fell to the left side, pt got self up and tried to "tough it out" but pain became to bad. Pt denies LOC, dizziness or lightheadedness. Pt has left leg shortening, and left hip bruising and deformity palpated. Pt has had a total of 150 mcg of fentanyl IV. Pt SpO2 93%/RA and 100%/2L [place]

## 2014-10-11 NOTE — H&P (Addendum)
[facility] History and Physical  Jackie [name] YWV:[id number] DOB: 1932-07-02 DOA: 10/11/2014  Referring physician: PCP: Sheela Stack, MD   Chief Complaint: fall.  HPI:  80 year old female presents after falling at home this morning. She was using her walker and the walker got stuck when going from carpet to regular floor causing her to fall. She fell on her  left hip. She states she's been able to put minimal weight on it since but the pain is progressively worsened so she came in to the ER. She took 2 Tylenol without any relief. EMS gave her 150 g fentanyl with improvement of pain from a 10 to an 8. She complains of  Numbness in her left ankle since the fall. Patient is developed a bruise to her left lateral thigh. Denies hitting her head or LOC.She had pelvic and T spine fractures from a fall in May and has been home since       Review of Systems: negative for the following  Constitutional: Denies fever, chills, diaphoresis, appetite change and fatigue.  HEENT: Denies photophobia, eye pain, redness, hearing loss, ear pain, congestion, sore throat, rhinorrhea, sneezing, mouth sores, trouble swallowing, neck pain, neck stiffness and tinnitus.  Respiratory: Denies SOB, DOE, cough, chest tightness, and wheezing.  Cardiovascular: Denies chest pain, palpitations and leg swelling.  Gastrointestinal: Denies nausea, vomiting, abdominal pain, diarrhea, constipation, blood in stool and abdominal distention.  Genitourinary: Denies dysuria, urgency, frequency, hematuria, flank pain and difficulty urinating.  Musculoskeletal: Musculoskeletal: Positive for arthralgias, back pain, joint swelling, arthralgias  And painful; gait problem.  Skin: Denies pallor, rash and wound.  Neurological: Denies dizziness, seizures, syncope, weakness, light-headedness, numbness and headaches.  Hematological: Denies adenopathy. Easy bruising, personal or family bleeding history  Psychiatric/Behavioral:  Denies suicidal ideation, mood changes, confusion, nervousness, sleep disturbance and agitation       Past Medical History  Diagnosis Date  . Thyroid disease   . Cancer      Past Surgical History  Procedure Laterality Date  . Total thyroidectomy        Social History:  reports that she has never smoked. She does not have any smokeless tobacco history on file. She reports that she does not drink alcohol or use illicit drugs.    Allergies  Allergen Reactions  . Codeine Camsylate [Codeine] Other (See Comments)    Caused severe headaches, dizziness and confusion  . Zithromax [Azithromycin] Nausea And Vomiting    No family history on file.   Prior to Admission medications   Medication Sig Start Date End Date Taking? Authorizing Provider  acetaminophen (TYLENOL) 325 MG tablet Take 650 mg by mouth every 6 (six) hours as needed for moderate pain.   Yes Historical Provider, MD  aspirin 81 MG tablet Take 81 mg by mouth daily with breakfast.    Yes Historical Provider, MD  Cholecalciferol (VITAMIN D-3 PO) Take 1 tablet by mouth daily.   Yes Historical Provider, MD  ibuprofen (ADVIL,MOTRIN) 200 MG tablet Take 200 mg by mouth every 6 (six) hours as needed for moderate pain.   Yes Historical Provider, MD  levothyroxine (SYNTHROID, LEVOTHROID) 137 MCG tablet Take 137 mcg by mouth daily before breakfast.   Yes Historical Provider, MD  Multiple Minerals-Vitamins (CALCIUM & VIT D3 BONE HEALTH PO) Take 1 tablet by mouth daily.   Yes Historical Provider, MD  Multiple Vitamins-Minerals (MULTIVITAMIN WITH MINERALS) tablet Take 1 tablet by mouth daily.   Yes Historical Provider, MD     Physical Exam: Danley Danker  Vitals:   10/11/14 1730 10/11/14 1745 10/11/14 1800 10/11/14 1831  BP: 164/82 160/76 171/86   Pulse: 77 80 85 79  Temp:      TempSrc:      Resp: 15 13 28 15   Height:      Weight:      SpO2: 97% 97% 98% 97%     Constitutional: Vital signs reviewed. Patient is a well-developed and  well-nourished in no acute distress and cooperative with exam. Alert and oriented x3.  Head: Normocephalic and atraumatic  Ear: TM normal bilaterally  Mouth: no erythema or exudates, MMM  Eyes: PERRL, EOMI, conjunctivae normal, No scleral icterus.  Neck: Supple, Trachea midline normal ROM, No JVD, mass, thyromegaly, or carotid bruit present.  Cardiovascular: RRR, S1 normal, S2 normal, no MRG, pulses symmetric and intact bilaterally  Pulmonary/Chest: CTAB, no wheezes, rales, or rhonchi  Abdominal: Soft. Non-tender, non-distended, bowel sounds are normal, no masses, organomegaly, or guarding present.  GU: no CVA tenderness Musculoskeletal:She exhibits decreased range of motion, tenderness, bony tenderness and swelling. Ext: no edema and no cyanosis, pulses palpable bilaterally (DP and PT)  Hematology: no cervical, inginal, or axillary adenopathy.  Neurological: A&O x3, Strenght is normal and symmetric bilaterally, cranial nerve II-XII are grossly intact, no focal motor deficit, sensory intact to light touch bilaterally.  Skin: Warm, dry and intact. No rash, cyanosis, or clubbing.  Psychiatric: Normal mood and affect. speech and behavior is normal. Judgment and thought content normal. Cognition and memory are normal.       Labs on Admission:    Basic Metabolic Panel:  Recent Labs Lab 10/11/14 1559  NA 132*  K 3.9  CL 96  CO2 22  GLUCOSE 160*  BUN 21  CREATININE 0.69  CALCIUM 9.5   Liver Function Tests: No results for input(s): AST, ALT, ALKPHOS, BILITOT, PROT, ALBUMIN in the last 168 hours. No results for input(s): LIPASE, AMYLASE in the last 168 hours. No results for input(s): AMMONIA in the last 168 hours. CBC:  Recent Labs Lab 10/11/14 1559  WBC 12.4*  NEUTROABS 11.4*  HGB 12.4  HCT 38.0  MCV 90.3  PLT 274   Cardiac Enzymes: No results for input(s): CKTOTAL, CKMB, CKMBINDEX, TROPONINI in the last 168 hours.  BNP (last 3 results) No results for input(s): PROBNP  in the last 8760 hours.    CBG: No results for input(s): GLUCAP in the last 168 hours.  Radiological Exams on Admission: Dg Hip Complete Left  10/11/2014   CLINICAL DATA:  Walking to the bathroom and fell when the wheel of her walker got caught on the carpet. Pain in the left hip.  EXAM: LEFT HIP - COMPLETE 2+ VIEW  COMPARISON:  06/28/2009.  CT pelvis 04/16/2014  FINDINGS: There is an acute femoral neck fracture on the left, displaced almost 2 cm. No intertrochanteric component. There is sclerosis of the sacrum related to healing sacral fractures. There is widening and fragmentation of the symphysis pubis region related to subacute symphyseal region fractures with apparent nonunion and fragmentation.  IMPRESSION: Acute left femoral neck fracture displaced 2 cm.  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  Healing sacral fractures with sclerotic change.   Electronically Signed   By: Nelson Chimes M.D.   On: 10/11/2014 17:19    EKG: Independently reviewed.   Assessment/Plan Active Problems:   Femoral neck fracture   Hip fracture   Hip Fracture  Per Dr Regenia Skeeter , Dr Lorin Mercy to see the patient Mechanical  fall No CV risk factors in med hx  Patient denies hx of CHF, a fib , CAD  Low to moderate risk for surgery given age Family agrees to proceed preop 2 d ECHO   Hx of thryroid cancer  Will check TSH      Code Status:   full Family Communication: bedside Disposition Plan: admit   Time spent: 46 mins   [facility] Pager [phone]  If 7PM-7AM, please contact night-coverage [email or web address] Password [facility] 10/11/2014, 6:52 PM

## 2014-10-11 NOTE — ED Notes (Signed)
Admitting at bedside. Requesting to insert foley

## 2014-10-11 NOTE — Consult Note (Signed)
Reason for Consult:  Left Displaced femoral neck fracture Referring Physician: Allyson Sabal MD  Jackie [name] is an 80 y.o. female.  HPI: fall at home with Gilford Rile when walker caught on edge of rug. Left closed displaced femoral neck fracture.   Past Medical History  Diagnosis Date  . Thyroid disease   . Cancer     Past Surgical History  Procedure Laterality Date  . Total thyroidectomy      No family history on file.  Social History:  reports that she has never smoked. She does not have any smokeless tobacco history on file. She reports that she does not drink alcohol or use illicit drugs.  Allergies:  Allergies  Allergen Reactions  . Codeine Camsylate [Codeine] Other (See Comments)    Caused severe headaches, dizziness and confusion  . Zithromax [Azithromycin] Nausea And Vomiting    Medications: I have reviewed the patient's current medications.  Results for orders placed or performed during the hospital encounter of 10/11/14 (from the past 48 hour(s))  Basic metabolic panel     Status: Abnormal   Collection Time: 10/11/14  3:59 PM  Result Value Ref Range   Sodium 132 (L) 137 - 147 mEq/L   Potassium 3.9 3.7 - 5.3 mEq/L   Chloride 96 96 - 112 mEq/L   CO2 22 19 - 32 mEq/L   Glucose, Bld 160 (H) 70 - 99 mg/dL   BUN 21 6 - 23 mg/dL   Creatinine, Ser 0.69 0.50 - 1.10 mg/dL   Calcium 9.5 8.4 - 10.5 mg/dL   GFR calc non Af Amer 80 (L) >90 mL/min   GFR calc Af Amer >90 >90 mL/min    Comment: (NOTE) The eGFR has been calculated using the CKD EPI equation. This calculation has not been validated in all clinical situations. eGFR's persistently <90 mL/min signify possible Chronic Kidney Disease.    Anion gap 14 5 - 15  CBC WITH DIFFERENTIAL     Status: Abnormal   Collection Time: 10/11/14  3:59 PM  Result Value Ref Range   WBC 12.4 (H) 4.0 - 10.5 K/uL   RBC 4.21 3.87 - 5.11 MIL/uL   Hemoglobin 12.4 12.0 - 15.0 g/dL   HCT 38.0 36.0 - 46.0 %   MCV 90.3 78.0 - 100.0 fL   MCH  29.5 26.0 - 34.0 pg   MCHC 32.6 30.0 - 36.0 g/dL   RDW 14.1 11.5 - 15.5 %   Platelets 274 150 - 400 K/uL   Neutrophils Relative % 92 (H) 43 - 77 %   Neutro Abs 11.4 (H) 1.7 - 7.7 K/uL   Lymphocytes Relative 3 (L) 12 - 46 %   Lymphs Abs 0.3 (L) 0.7 - 4.0 K/uL   Monocytes Relative 5 3 - 12 %   Monocytes Absolute 0.7 0.1 - 1.0 K/uL   Eosinophils Relative 0 0 - 5 %   Eosinophils Absolute 0.0 0.0 - 0.7 K/uL   Basophils Relative 0 0 - 1 %   Basophils Absolute 0.0 0.0 - 0.1 K/uL  Protime-INR     Status: None   Collection Time: 10/11/14  3:59 PM  Result Value Ref Range   Prothrombin Time 13.7 11.6 - 15.2 seconds   INR 1.04 0.00 - 1.49  Type and screen     Status: None   Collection Time: 10/11/14  3:59 PM  Result Value Ref Range   ABO/RH(D) A POS    Antibody Screen NEG    Sample Expiration 10/14/2014  ABO/Rh     Status: None   Collection Time: 10/11/14  3:59 PM  Result Value Ref Range   ABO/RH(D) A POS   CBC     Status: Abnormal   Collection Time: 10/11/14  7:29 PM  Result Value Ref Range   WBC 9.5 4.0 - 10.5 K/uL   RBC 3.86 (L) 3.87 - 5.11 MIL/uL   Hemoglobin 11.5 (L) 12.0 - 15.0 g/dL   HCT 34.3 (L) 36.0 - 46.0 %   MCV 88.9 78.0 - 100.0 fL   MCH 29.8 26.0 - 34.0 pg   MCHC 33.5 30.0 - 36.0 g/dL   RDW 14.0 11.5 - 15.5 %   Platelets 250 150 - 400 K/uL  Creatinine, serum     Status: Abnormal   Collection Time: 10/11/14  7:29 PM  Result Value Ref Range   Creatinine, Ser 0.65 0.50 - 1.10 mg/dL   GFR calc non Af Amer 82 (L) >90 mL/min   GFR calc Af Amer >90 >90 mL/min    Comment: (NOTE) The eGFR has been calculated using the CKD EPI equation. This calculation has not been validated in all clinical situations. eGFR's persistently <90 mL/min signify possible Chronic Kidney Disease.   Troponin I     Status: None   Collection Time: 10/11/14  7:29 PM  Result Value Ref Range   Troponin I <0.30 <0.30 ng/mL    Comment:        Due to the release kinetics of cTnI, a negative result  within the first hours of the onset of symptoms does not rule out myocardial infarction with certainty. If myocardial infarction is still suspected, repeat the test at appropriate intervals.   TSH     Status: Abnormal   Collection Time: 10/11/14  8:27 PM  Result Value Ref Range   TSH 0.063 (L) 0.350 - 4.500 uIU/mL  Troponin I     Status: None   Collection Time: 10/11/14  8:27 PM  Result Value Ref Range   Troponin I <0.30 <0.30 ng/mL    Comment:        Due to the release kinetics of cTnI, a negative result within the first hours of the onset of symptoms does not rule out myocardial infarction with certainty. If myocardial infarction is still suspected, repeat the test at appropriate intervals.     Dg Hip Complete Left  10/11/2014   CLINICAL DATA:  Walking to the bathroom and fell when the wheel of her walker got caught on the carpet. Pain in the left hip.  EXAM: LEFT HIP - COMPLETE 2+ VIEW  COMPARISON:  06/28/2009.  CT pelvis 04/16/2014  FINDINGS: There is an acute femoral neck fracture on the left, displaced almost 2 cm. No intertrochanteric component. There is sclerosis of the sacrum related to healing sacral fractures. There is widening and fragmentation of the symphysis pubis region related to subacute symphyseal region fractures with apparent nonunion and fragmentation.  IMPRESSION: Acute left femoral neck fracture displaced 2 cm.  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  Healing sacral fractures with sclerotic change.   Electronically Signed   By: Nelson Chimes M.D.   On: 10/11/2014 17:19    Review of Systems  Constitutional: Negative for weight loss.  Eyes: Negative for double vision.  Respiratory: Negative for hemoptysis.   Cardiovascular: Negative.   Gastrointestinal: Negative for nausea.  Musculoskeletal: Positive for falls.  Neurological: Positive for weakness. Negative for seizures and loss of consciousness.   Blood pressure 171/89,  pulse 86, temperature 98.3 F (36.8 C), temperature source Oral, resp. rate 18, height 5' 2"  (1.575 m), weight 60.102 kg (132 lb 8 oz), SpO2 90 %. Physical Exam  Constitutional: She is oriented to person, place, and time. She appears well-developed.  HENT:  Glasses.   Eyes: EOM are normal.  Neck: Normal range of motion.  Cardiovascular: Normal rate.   Respiratory: Effort normal.  GI: Soft.  Musculoskeletal:  Pulses normal left hip short and ER.   Neurological: She is alert and oriented to person, place, and time.  Skin: Skin is warm and dry.  Psychiatric: She has a normal mood and affect. Her behavior is normal.    Assessment/Plan: Displaced femoral neck fracture for hemiarthroplasty tomorrow.  NPO after MN, bucks traction  [place] Maiorana C 10/11/2014, 10:52 PM

## 2014-10-11 NOTE — ED Notes (Signed)
Transporting patient to new room assignment. 

## 2014-10-11 NOTE — ED Notes (Signed)
Pt is back from xray and on the monitor

## 2014-10-11 NOTE — ED Provider Notes (Signed)
CSN: [id number]     Arrival date & time 10/11/14  1539 History   First MD Initiated Contact with Patient 10/11/14 1541     Chief Complaint  Patient presents with  . Fall     (Consider location/radiation/quality/duration/timing/severity/associated sxs/prior Treatment) HPI  80 year old female presents after falling at home this morning. She was using her walker and the walker got stuck when going from carpet to regular floor causing her to fall. She injured her left hip. She states she's been able to put minimal weight on it since but the pain is progressively worsened so she came in to the ER. She took 2 Tylenol without any relief. EMS gave her 150 g fentanyl with improvement of pain from a 10 to an 8. She declines pain medicine at this time. No weakness or numbness. Patient is developed a bruise to her left lateral thigh. Denies hitting her head or neck.  Past Medical History  Diagnosis Date  . Thyroid disease   . Cancer    Past Surgical History  Procedure Laterality Date  . Total thyroidectomy     No family history on file. History  Substance Use Topics  . Smoking status: Never Smoker   . Smokeless tobacco: Not on file  . Alcohol Use: No   OB History    No data available     Review of Systems  Gastrointestinal: Negative for vomiting.  Musculoskeletal: Positive for arthralgias.  Skin: Positive for color change (bruising).  Neurological: Negative for weakness and headaches.  All other systems reviewed and are negative.     Allergies  Zithromax  Home Medications   Prior to Admission medications   Medication Sig Start Date End Date Taking? Authorizing Provider  aspirin 81 MG tablet Take 81 mg by mouth daily.    Historical Provider, MD  levothyroxine (SYNTHROID, LEVOTHROID) 112 MCG tablet Take 112 mcg by mouth daily before breakfast.    Historical Provider, MD  Multiple Vitamins-Minerals (MULTIVITAMIN WITH MINERALS) tablet Take 1 tablet by mouth daily.    Historical  Provider, MD  rosuvastatin (CRESTOR) 5 MG tablet Take 5 mg by mouth daily.    Historical Provider, MD  traMADol (ULTRAM) 50 MG tablet Take 1 tablet (50 mg total) by mouth every 6 (six) hours as needed. 03/10/14   April K Palumbo-Rasch, MD   SpO2 100% Physical Exam  Constitutional: She is oriented to person, place, and time. She appears well-developed and well-nourished.  HENT:  Head: Normocephalic and atraumatic.  Right Ear: External ear normal.  Left Ear: External ear normal.  Nose: Nose normal.  Eyes: Right eye exhibits no discharge. Left eye exhibits no discharge.  Cardiovascular: Normal rate.   Pulses:      Dorsalis pedis pulses are 2+ on the right side, and 2+ on the left side.  Pulmonary/Chest: Effort normal.  Abdominal: Soft. There is no tenderness.  Musculoskeletal:       Left hip: She exhibits decreased range of motion, tenderness, bony tenderness and swelling.       Legs: Neurological: She is alert and oriented to person, place, and time.  Skin: Skin is warm and dry.  Nursing note and vitals reviewed.   ED Course  Procedures (including critical care time) Labs Review Labs Reviewed  BASIC METABOLIC PANEL - Abnormal; Notable for the following:    Sodium 132 (*)    Glucose, Bld 160 (*)    GFR calc non Af Amer 80 (*)    All other components within normal limits  CBC WITH DIFFERENTIAL - Abnormal; Notable for the following:    WBC 12.4 (*)    Neutrophils Relative % 92 (*)    Neutro Abs 11.4 (*)    Lymphocytes Relative 3 (*)    Lymphs Abs 0.3 (*)    All other components within normal limits  CBC - Abnormal; Notable for the following:    RBC 3.86 (*)    Hemoglobin 11.5 (*)    HCT 34.3 (*)    All other components within normal limits  CREATININE, SERUM - Abnormal; Notable for the following:    GFR calc non Af Amer 82 (*)    All other components within normal limits  TSH - Abnormal; Notable for the following:    TSH 0.063 (*)    All other components within normal  limits  URINE CULTURE  PROTIME-INR  TROPONIN I  TROPONIN I  PROTIME-INR  URINALYSIS, ROUTINE W REFLEX MICROSCOPIC  TROPONIN I  HEMOGLOBIN A1C  COMPREHENSIVE METABOLIC PANEL  CBC  TYPE AND SCREEN  ABO/RH    Imaging Review Dg Hip Complete Left  10/11/2014   CLINICAL DATA:  Walking to the bathroom and fell when the wheel of her walker got caught on the carpet. Pain in the left hip.  EXAM: LEFT HIP - COMPLETE 2+ VIEW  COMPARISON:  06/28/2009.  CT pelvis 04/16/2014  FINDINGS: There is an acute femoral neck fracture on the left, displaced almost 2 cm. No intertrochanteric component. There is sclerosis of the sacrum related to healing sacral fractures. There is widening and fragmentation of the symphysis pubis region related to subacute symphyseal region fractures with apparent nonunion and fragmentation.  IMPRESSION: Acute left femoral neck fracture displaced 2 cm.  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  Healing sacral fractures with sclerotic change.   Electronically Signed   By: Nelson Chimes M.D.   On: 10/11/2014 17:19     EKG Interpretation None      MDM   Final diagnoses:  Femoral neck fracture, left, closed, initial encounter    Patient with a left femoral neck fracture after a mechanical fall. Patient is neurovascularly intact. Pain is controlled in the ED. Discussed with Dr. Lorin Mercy of orthopedics, he will consult on the patient and decide on when to take her to the operating room. Hospitalist has been consulted for admission.    Ephraim Hamburger, MD 10/11/14 [phone]

## 2014-10-11 NOTE — ED Notes (Signed)
Pt still at scans

## 2014-10-12 ENCOUNTER — Inpatient Hospital Stay (HOSPITAL_COMMUNITY): Payer: Medicare Other | Admitting: Anesthesiology

## 2014-10-12 ENCOUNTER — Encounter (HOSPITAL_COMMUNITY)
Admission: EM | Disposition: A | Discharge status: Skilled Nursing Facility | Payer: Self-pay | Source: Home / Self Care | Attending: Internal Medicine

## 2014-10-12 DIAGNOSIS — S72002A Fracture of unspecified part of neck of left femur, initial encounter for closed fracture: Principal | ICD-10-CM

## 2014-10-12 DIAGNOSIS — I369 Nonrheumatic tricuspid valve disorder, unspecified: Secondary | ICD-10-CM

## 2014-10-12 HISTORY — PX: HIP ARTHROPLASTY: SHX981

## 2014-10-12 LAB — COMPREHENSIVE METABOLIC PANEL
ALT: 13 U/L (ref 0–35)
AST: 22 U/L (ref 0–37)
Albumin: 3.2 g/dL — ABNORMAL LOW (ref 3.5–5.2)
Alkaline Phosphatase: 78 U/L (ref 39–117)
Anion gap: 11 (ref 5–15)
BUN: 14 mg/dL (ref 6–23)
CO2: 24 mEq/L (ref 19–32)
Calcium: 8.8 mg/dL (ref 8.4–10.5)
Chloride: 97 mEq/L (ref 96–112)
Creatinine, Ser: 0.65 mg/dL (ref 0.50–1.10)
GFR calc Af Amer: >90 mL/min (ref >90)
GFR calc non Af Amer: 82 mL/min — ABNORMAL LOW (ref >90)
Glucose, Bld: 112 mg/dL — ABNORMAL HIGH (ref 70–99)
Potassium: 3.9 mEq/L (ref 3.7–5.3)
Sodium: 132 mEq/L — ABNORMAL LOW (ref 137–147)
Total Bilirubin: 0.8 mg/dL (ref 0.3–1.2)
Total Protein: 6.4 g/dL (ref 6.0–8.3)

## 2014-10-12 LAB — CBC
HCT: 34.7 % — ABNORMAL LOW (ref 36.0–46.0)
HCT: 33.1 % — ABNORMAL LOW (ref 36.0–46.0)
Hemoglobin: 11.5 g/dL — ABNORMAL LOW (ref 12.0–15.0)
Hemoglobin: 11 g/dL — ABNORMAL LOW (ref 12.0–15.0)
MCH: 29.5 pg (ref 26.0–34.0)
MCH: 29.6 pg (ref 26.0–34.0)
MCHC: 33.1 g/dL (ref 30.0–36.0)
MCHC: 33.2 g/dL (ref 30.0–36.0)
MCV: 89 fL (ref 78.0–100.0)
MCV: 89 fL (ref 78.0–100.0)
Platelets: 233 10*3/uL (ref 150–400)
Platelets: 184 10*3/uL (ref 150–400)
RBC: 3.9 MIL/uL (ref 3.87–5.11)
RBC: 3.72 MIL/uL — ABNORMAL LOW (ref 3.87–5.11)
RDW: 14.2 % (ref 11.5–15.5)
RDW: 14.4 % (ref 11.5–15.5)
WBC: 7.4 10*3/uL (ref 4.0–10.5)
WBC: 9.4 10*3/uL (ref 4.0–10.5)

## 2014-10-12 LAB — URINALYSIS, ROUTINE W REFLEX MICROSCOPIC
Bilirubin Urine: NEGATIVE
Glucose, UA: NEGATIVE mg/dL
Hgb urine dipstick: NEGATIVE
Ketones, ur: NEGATIVE mg/dL
Leukocytes, UA: NEGATIVE
Nitrite: NEGATIVE
Protein, ur: NEGATIVE mg/dL
Specific Gravity, Urine: 1.02 (ref 1.005–1.030)
Urobilinogen, UA: 0.2 mg/dL (ref 0.0–1.0)
pH: 6.5 (ref 5.0–8.0)

## 2014-10-12 LAB — TROPONIN I: Troponin I: <0.3 ng/mL (ref <0.30)

## 2014-10-12 LAB — PROTIME-INR
INR: 1.14 (ref 0.00–1.49)
Prothrombin Time: 14.7 seconds (ref 11.6–15.2)

## 2014-10-12 LAB — HEMOGLOBIN A1C
Hgb A1c MFr Bld: 5.7 % — ABNORMAL HIGH (ref <5.7)
Mean Plasma Glucose: 117 mg/dL — ABNORMAL HIGH (ref <117)

## 2014-10-12 LAB — SURGICAL PCR SCREEN
MRSA, PCR: NEGATIVE
Staphylococcus aureus: POSITIVE — AB

## 2014-10-12 SURGERY — HEMIARTHROPLASTY, HIP, DIRECT ANTERIOR APPROACH, FOR FRACTURE
Anesthesia: Monitor Anesthesia Care | Site: Hip | Laterality: Left

## 2014-10-12 MED ORDER — EPHEDRINE SULFATE 50 MG/ML IJ SOLN
INTRAMUSCULAR | Status: AC
  Filled 2014-10-12: qty 1

## 2014-10-12 MED ORDER — MORPHINE SULFATE 2 MG/ML IJ SOLN
1.0000 mg | INTRAMUSCULAR | Status: DC | PRN
Start: 2014-10-12 — End: 2014-10-12
  Administered 2014-10-12: 1 mg via INTRAVENOUS
  Filled 2014-10-12: qty 1

## 2014-10-12 MED ORDER — MULTI-VITAMIN/MINERALS PO TABS: 1.0000 | ORAL_TABLET | Freq: Every day | ORAL | Status: DC

## 2014-10-12 MED ORDER — EPHEDRINE SULFATE 50 MG/ML IJ SOLN
INTRAMUSCULAR | Status: DC | PRN
  Administered 2014-10-12 (×2): 10 mg via INTRAVENOUS

## 2014-10-12 MED ORDER — ONDANSETRON HCL 4 MG/2ML IJ SOLN
INTRAMUSCULAR | Status: DC | PRN
  Administered 2014-10-12: 4 mg via INTRAVENOUS

## 2014-10-12 MED ORDER — CALCIUM & VIT D3 BONE HEALTH PO LIQD: Freq: Every day | ORAL | Status: DC

## 2014-10-12 MED ORDER — FENTANYL CITRATE 0.05 MG/ML IJ SOLN
INTRAMUSCULAR | Status: DC | PRN
  Administered 2014-10-12: 50 ug via INTRAVENOUS
  Administered 2014-10-12: 100 ug via INTRAVENOUS
  Administered 2014-10-12 (×2): 50 ug via INTRAVENOUS

## 2014-10-12 MED ORDER — VITAMIN D 1000 UNITS PO TABS
1000.0000 [IU] | ORAL_TABLET | Freq: Every day | ORAL | Status: DC
  Administered 2014-10-13 – 2014-10-15 (×3): 1000 [IU] via ORAL
  Filled 2014-10-12 (×3): qty 1

## 2014-10-12 MED ORDER — POLYETHYLENE GLYCOL 3350 17 G PO PACK: 17.0000 g | PACK | Freq: Every day | ORAL | Status: DC | PRN

## 2014-10-12 MED ORDER — OXYCODONE HCL 5 MG/5ML PO SOLN: 5.0000 mg | Freq: Once | ORAL | Status: AC | PRN

## 2014-10-12 MED ORDER — ACETAMINOPHEN 325 MG PO TABS
325.0000 mg | ORAL_TABLET | ORAL | Status: DC | PRN
  Administered 2014-10-12: 650 mg via ORAL

## 2014-10-12 MED ORDER — BISACODYL 10 MG RE SUPP: 10.0000 mg | Freq: Every day | RECTAL | Status: DC | PRN

## 2014-10-12 MED ORDER — LIDOCAINE HCL (CARDIAC) 20 MG/ML IV SOLN
INTRAVENOUS | Status: AC
  Filled 2014-10-12: qty 5

## 2014-10-12 MED ORDER — PROPOFOL 10 MG/ML IV BOLUS
INTRAVENOUS | Status: AC
  Filled 2014-10-12: qty 20

## 2014-10-12 MED ORDER — ACETAMINOPHEN 325 MG PO TABS: 650.0000 mg | ORAL_TABLET | Freq: Four times a day (QID) | ORAL | Status: DC | PRN

## 2014-10-12 MED ORDER — MORPHINE SULFATE 2 MG/ML IJ SOLN: 0.5000 mg | INTRAMUSCULAR | Status: DC | PRN

## 2014-10-12 MED ORDER — OXYCODONE HCL 5 MG PO TABS
ORAL_TABLET | ORAL | Status: AC
  Administered 2014-10-12: 5 mg via ORAL
  Filled 2014-10-12: qty 1

## 2014-10-12 MED ORDER — OXYCODONE HCL 5 MG PO TABS
5.0000 mg | ORAL_TABLET | Freq: Once | ORAL | Status: AC | PRN
  Administered 2014-10-12: 5 mg via ORAL

## 2014-10-12 MED ORDER — LACTATED RINGERS IV SOLN
INTRAVENOUS | Status: DC | PRN
  Administered 2014-10-12 (×2): via INTRAVENOUS

## 2014-10-12 MED ORDER — BUPIVACAINE HCL (PF) 0.25 % IJ SOLN
INTRAMUSCULAR | Status: DC | PRN
  Administered 2014-10-12: 12 mL

## 2014-10-12 MED ORDER — BUPIVACAINE HCL (PF) 0.25 % IJ SOLN
INTRAMUSCULAR | Status: AC
  Filled 2014-10-12: qty 30

## 2014-10-12 MED ORDER — PROPOFOL 10 MG/ML IV BOLUS
INTRAVENOUS | Status: DC | PRN
  Administered 2014-10-12: 30 mg via INTRAVENOUS
  Administered 2014-10-12: 20 mg via INTRAVENOUS
  Administered 2014-10-12: 90 mg via INTRAVENOUS
  Administered 2014-10-12 (×2): 30 mg via INTRAVENOUS

## 2014-10-12 MED ORDER — ACETAMINOPHEN 325 MG PO TABS
ORAL_TABLET | ORAL | Status: AC
Start: 2014-10-12 — End: 2014-10-12
  Administered 2014-10-12: 650 mg via ORAL
  Filled 2014-10-12: qty 2

## 2014-10-12 MED ORDER — DEXAMETHASONE SODIUM PHOSPHATE 4 MG/ML IJ SOLN
INTRAMUSCULAR | Status: AC
  Filled 2014-10-12: qty 2

## 2014-10-12 MED ORDER — HYDROMORPHONE HCL 1 MG/ML IJ SOLN
INTRAMUSCULAR | Status: AC
  Administered 2014-10-12: 0.25 mg via INTRAVENOUS
  Filled 2014-10-12: qty 1

## 2014-10-12 MED ORDER — LEVOTHYROXINE SODIUM 137 MCG PO TABS
137.0000 ug | ORAL_TABLET | Freq: Every day | ORAL | Status: DC
  Filled 2014-10-12: qty 1

## 2014-10-12 MED ORDER — SUCCINYLCHOLINE CHLORIDE 20 MG/ML IJ SOLN
INTRAMUSCULAR | Status: AC
  Filled 2014-10-12: qty 1

## 2014-10-12 MED ORDER — HYDROMORPHONE HCL 1 MG/ML IJ SOLN
0.2500 mg | INTRAMUSCULAR | Status: DC | PRN
  Administered 2014-10-12: 0.25 mg via INTRAVENOUS

## 2014-10-12 MED ORDER — SUCCINYLCHOLINE CHLORIDE 20 MG/ML IJ SOLN
INTRAMUSCULAR | Status: DC | PRN
  Administered 2014-10-12: 40 mg via INTRAVENOUS

## 2014-10-12 MED ORDER — SODIUM CHLORIDE 0.9 % IR SOLN
Status: DC | PRN
  Administered 2014-10-12: 1000 mL

## 2014-10-12 MED ORDER — CEFAZOLIN SODIUM-DEXTROSE 2-3 GM-% IV SOLR
2.0000 g | Freq: Once | INTRAVENOUS | Status: AC
  Administered 2014-10-12: 2 g via INTRAVENOUS
  Filled 2014-10-12: qty 50

## 2014-10-12 MED ORDER — HYDROCODONE-ACETAMINOPHEN 5-325 MG PO TABS
1.0000 | ORAL_TABLET | Freq: Four times a day (QID) | ORAL | Status: DC | PRN
  Administered 2014-10-12 – 2014-10-15 (×4): 2 via ORAL
  Filled 2014-10-12 (×4): qty 2

## 2014-10-12 MED ORDER — FENTANYL CITRATE 0.05 MG/ML IJ SOLN
INTRAMUSCULAR | Status: AC
  Filled 2014-10-12: qty 5

## 2014-10-12 MED ORDER — ONDANSETRON HCL 4 MG/2ML IJ SOLN
INTRAMUSCULAR | Status: AC
  Filled 2014-10-12: qty 2

## 2014-10-12 MED ORDER — ACETAMINOPHEN 160 MG/5ML PO SOLN
325.0000 mg | ORAL | Status: DC | PRN
  Filled 2014-10-12: qty 20.3

## 2014-10-12 MED ORDER — CALCIUM CARBONATE-VITAMIN D 500-200 MG-UNIT PO TABS
1.0000 | ORAL_TABLET | Freq: Every day | ORAL | Status: DC
  Administered 2014-10-13 – 2014-10-14 (×2): 1 via ORAL
  Filled 2014-10-12 (×4): qty 1

## 2014-10-12 MED ORDER — DOCUSATE SODIUM 100 MG PO CAPS
100.0000 mg | ORAL_CAPSULE | Freq: Two times a day (BID) | ORAL | Status: DC
  Administered 2014-10-12 – 2014-10-15 (×6): 100 mg via ORAL
  Filled 2014-10-12 (×7): qty 1

## 2014-10-12 MED ORDER — ADULT MULTIVITAMIN W/MINERALS CH
1.0000 | ORAL_TABLET | Freq: Every day | ORAL | Status: DC
  Administered 2014-10-14: 1 via ORAL
  Filled 2014-10-12 (×3): qty 1

## 2014-10-12 SURGICAL SUPPLY — 68 items
APL SKNCLS STERI-STRIP NONHPOA (GAUZE/BANDAGES/DRESSINGS) ×1
BENZOIN TINCTURE PRP APPL 2/3 (GAUZE/BANDAGES/DRESSINGS) ×2 IMPLANT
BLADE SAW SAG 73X25 THK (BLADE) ×1
BLADE SAW SGTL 73X25 THK (BLADE) ×1 IMPLANT
BLADE SURG ROTATE 9660 (MISCELLANEOUS) IMPLANT
BRUSH FEMORAL CANAL (MISCELLANEOUS) IMPLANT
CAPT HIP FX BIPOLAR/UNIPOLAR ×2 IMPLANT
COVER BACK TABLE 24X17X13 BIG (DRAPES) IMPLANT
DRAPE IMP U-DRAPE 54X76 (DRAPES) ×2 IMPLANT
DRAPE INCISE IOBAN 66X45 STRL (DRAPES) IMPLANT
DRAPE ORTHO SPLIT 77X108 STRL (DRAPES) ×4
DRAPE STERI IOBAN 125X83 (DRAPES) ×2 IMPLANT
DRAPE SURG ORHT 6 SPLT 77X108 (DRAPES) ×2 IMPLANT
DRAPE U-SHAPE 47X51 STRL (DRAPES) ×2 IMPLANT
DRSG MEPILEX BORDER 4X8 (GAUZE/BANDAGES/DRESSINGS) ×2 IMPLANT
DRSG PAD ABDOMINAL 8X10 ST (GAUZE/BANDAGES/DRESSINGS) ×2 IMPLANT
DURAPREP 26ML APPLICATOR (WOUND CARE) ×2 IMPLANT
ELECT CAUTERY BLADE 6.4 (BLADE) ×2 IMPLANT
ELECT REM PT RETURN 9FT ADLT (ELECTROSURGICAL) ×2
ELECTRODE REM PT RTRN 9FT ADLT (ELECTROSURGICAL) ×1 IMPLANT
EVACUATOR 1/8 PVC DRAIN (DRAIN) IMPLANT
FACESHIELD WRAPAROUND (MASK) ×4 IMPLANT
GAUZE SPONGE 4X4 12PLY STRL (GAUZE/BANDAGES/DRESSINGS) ×2 IMPLANT
GAUZE XEROFORM 1X8 LF (GAUZE/BANDAGES/DRESSINGS) ×2 IMPLANT
GAUZE XEROFORM 5X9 LF (GAUZE/BANDAGES/DRESSINGS) ×2 IMPLANT
GLOVE BIOGEL PI IND STRL 7.5 (GLOVE) ×1 IMPLANT
GLOVE BIOGEL PI IND STRL 8 (GLOVE) ×1 IMPLANT
GLOVE BIOGEL PI INDICATOR 7.5 (GLOVE) ×1
GLOVE BIOGEL PI INDICATOR 8 (GLOVE) ×1
GLOVE ECLIPSE 7.0 STRL STRAW (GLOVE) ×2 IMPLANT
GLOVE ORTHO TXT STRL SZ7.5 (GLOVE) ×2 IMPLANT
GLOVE SKINSENSE NS SZ6.5 (GLOVE) ×1
GLOVE SKINSENSE STRL SZ6.5 (GLOVE) ×1 IMPLANT
GOWN STRL REUS W/ TWL LRG LVL3 (GOWN DISPOSABLE) ×1 IMPLANT
GOWN STRL REUS W/ TWL XL LVL3 (GOWN DISPOSABLE) ×1 IMPLANT
GOWN STRL REUS W/TWL LRG LVL3 (GOWN DISPOSABLE) ×2
GOWN STRL REUS W/TWL XL LVL3 (GOWN DISPOSABLE) ×2
HANDPIECE INTERPULSE COAX TIP (DISPOSABLE)
IMMOBILIZER KNEE 22 UNIV (SOFTGOODS) IMPLANT
KIT BASIN OR (CUSTOM PROCEDURE TRAY) ×2 IMPLANT
KIT ROOM TURNOVER OR (KITS) ×2 IMPLANT
MANIFOLD NEPTUNE II (INSTRUMENTS) ×2 IMPLANT
NDL SUT 2 .5 CRC MAYO 1.732X (NEEDLE) ×1 IMPLANT
NEEDLE HYPO 25GX1X1/2 BEV (NEEDLE) ×2 IMPLANT
NEEDLE MAYO TAPER (NEEDLE) ×2
NS IRRIG 1000ML POUR BTL (IV SOLUTION) ×2 IMPLANT
PACK TOTAL JOINT (CUSTOM PROCEDURE TRAY) ×2 IMPLANT
PACK UNIVERSAL I (CUSTOM PROCEDURE TRAY) ×2 IMPLANT
PAD ARMBOARD 7.5X6 YLW CONV (MISCELLANEOUS) ×4 IMPLANT
PIN STEINMAN 3/16 (PIN) ×2 IMPLANT
SET HNDPC FAN SPRY TIP SCT (DISPOSABLE) IMPLANT
SPONGE GAUZE 4X4 12PLY STER LF (GAUZE/BANDAGES/DRESSINGS) ×2 IMPLANT
SPONGE LAP 4X18 X RAY DECT (DISPOSABLE) ×4 IMPLANT
STAPLER VISISTAT 35W (STAPLE) ×2 IMPLANT
STRIP CLOSURE SKIN 1/2X4 (GAUZE/BANDAGES/DRESSINGS) ×4 IMPLANT
SUCTION FRAZIER TIP 10 FR DISP (SUCTIONS) ×2 IMPLANT
SUT ETHIBOND NAB CT1 #1 30IN (SUTURE) ×4 IMPLANT
SUT TICRON (SUTURE) ×4 IMPLANT
SUT VIC AB 2-0 CT1 27 (SUTURE) ×2
SUT VIC AB 2-0 CT1 TAPERPNT 27 (SUTURE) ×1 IMPLANT
SUT VICRYL 0 TIES 12 18 (SUTURE) ×2 IMPLANT
SYR CONTROL 10ML LL (SYRINGE) ×2 IMPLANT
TAPE CLOTH SURG 4X10 WHT LF (GAUZE/BANDAGES/DRESSINGS) ×2 IMPLANT
TOWEL OR 17X24 6PK STRL BLUE (TOWEL DISPOSABLE) ×2 IMPLANT
TOWEL OR 17X26 10 PK STRL BLUE (TOWEL DISPOSABLE) ×2 IMPLANT
TOWER CARTRIDGE SMART MIX (DISPOSABLE) IMPLANT
TRAY FOLEY CATH 16FRSI W/METER (SET/KITS/TRAYS/PACK) IMPLANT
WATER STERILE IRR 1000ML POUR (IV SOLUTION) ×8 IMPLANT

## 2014-10-12 NOTE — Progress Notes (Signed)
  Echocardiogram 2D Echocardiogram has been performed.  Donata Clay 10/12/2014, 4:15 PM

## 2014-10-12 NOTE — Progress Notes (Signed)
Orthopedic Tech Progress Note Patient Details:  Jackie [name] Jan 15, 1932 [id number]  Patient ID: Jackie [name], female   DOB: [birth year], 80 y.o.   MRN: [id number] Pt unable to use trapeze bar patient helper  Hildred Priest 10/12/2014, 2:52 PM

## 2014-10-12 NOTE — Progress Notes (Signed)
Attempted to place foley several times during night shift with no success. Notified Dr. Lorin Mercy. Foley will be placed in OR.

## 2014-10-12 NOTE — Brief Op Note (Signed)
10/11/2014 - 10/12/2014  12:29 PM  PATIENT:  Jackie [name]  80 y.o. female  PRE-OPERATIVE DIAGNOSIS:  Left Hip Fracture  POST-OPERATIVE DIAGNOSIS:  Left Hip Fracture  PROCEDURE:  Left hip monopolar Hemiarthroplasty  SURGEON:  Surgeon(s) and Role:    * Marybelle Killings, MD - Primary  PHYSICIAN ASSISTANT:   ASSISTANTS: none   ANESTHESIA:   general  EBL:  Total I/O In: 1700 [I.V.:1700] Out: 350 [Urine:250; Blood:100]  BLOOD ADMINISTERED:none  DRAINS: none   LOCAL MEDICATIONS USED:  MARCAINE     SPECIMEN:  No Specimen  DISPOSITION OF SPECIMEN:  N/A  COUNTS:  YES  TOURNIQUET:  * No tourniquets in log *  DICTATION: .Other Dictation: Dictation Number [id number]  PLAN OF CARE: already inpt  PATIENT DISPOSITION:  PACU - hemodynamically stable.   Delay start of Pharmacological VTE agent (>24hrs) due to surgical blood loss or risk of bleeding: not applicable

## 2014-10-12 NOTE — Anesthesia Postprocedure Evaluation (Signed)
  Anesthesia Post-op Note  Patient: Jackie [name]  Procedure(s) Performed: Procedure(s): ARTHROPLASTY BIPOLAR HIP (Left)  Patient Location: PACU  Anesthesia Type:General  Level of Consciousness: awake  Airway and Oxygen Therapy: Patient Spontanous Breathing and Patient connected to nasal cannula oxygen  Post-op Pain: mild  Post-op Assessment: Post-op Vital signs reviewed, Patient's Cardiovascular Status Stable, Respiratory Function Stable, Patent Airway, No signs of Nausea or vomiting and Pain level controlled  Post-op Vital Signs: Reviewed and stable  Last Vitals:  Filed Vitals:   10/12/14 1445  BP: 143/57  Pulse:   Temp: 36.6 C  Resp: 18    Complications: No apparent anesthesia complications

## 2014-10-12 NOTE — Anesthesia Preprocedure Evaluation (Addendum)
Anesthesia Evaluation  Patient identified by MRN, date of birth, ID band Patient awake    Reviewed: Allergy & Precautions, H&P , NPO status , Patient's Chart, lab work & pertinent test results  History of Anesthesia Complications Negative for: history of anesthetic complications  Airway Mallampati: II  TM Distance: >3 FB Neck ROM: Full    Dental  (+) Teeth Intact, Dental Advisory Given   Pulmonary neg pulmonary ROS,  breath sounds clear to auscultation        Cardiovascular negative cardio ROS  Rhythm:Regular     Neuro/Psych negative neurological ROS  negative psych ROS   GI/Hepatic negative GI ROS, Neg liver ROS,   Endo/Other  Hypothyroidism   Renal/GU negative Renal ROS     Musculoskeletal   Abdominal   Peds  Hematology  (+) anemia ,   Anesthesia Other Findings   Reproductive/Obstetrics                            Anesthesia Physical Anesthesia Plan  ASA: III  Anesthesia Plan: General   Post-op Pain Management:    Induction: Intravenous  Airway Management Planned: Oral ETT  Additional Equipment: None  Intra-op Plan:   Post-operative Plan: Extubation in OR  Informed Consent: I have reviewed the patients History and Physical, chart, labs and discussed the procedure including the risks, benefits and alternatives for the proposed anesthesia with the patient or authorized representative who has indicated his/her understanding and acceptance.   Dental advisory given  Plan Discussed with: CRNA and Surgeon  Anesthesia Plan Comments:        Anesthesia Quick Evaluation

## 2014-10-12 NOTE — Progress Notes (Signed)
Patient Demographics  Jackie [name], is a 80 y.o. female, DOB - Feb 10, 1932, TKW:[id number]  Admit date - 10/11/2014   Admitting Physician Reyne Dumas, MD  Outpatient Primary MD for the patient is Sheela Stack, MD  LOS - 1   Chief Complaint  Patient presents with  . Fall      Admission history of present illness/brief narrative: 80 year old female presents after falling at home this morning. She was using her walker and the walker got stuck when going from carpet to regular floor causing her to fall. She fell on her left hip. She states she's been able to put minimal weight on it since but the pain is progressively worsened so she came in to the ER. She took 2 Tylenol without any relief. EMS gave her 150 g fentanyl with improvement of pain from a 10 to an 8. She complains of Numbness in her left ankle since the fall. Patient is developed a bruise to her left lateral thigh. Denies hitting her head or LOC.She had pelvic and T spine fractures from a fall in May and has been home since . Patient underwent left hip surgical repair 10/12/14 by Dr.Yates Subjective:   Jackie [name] today has, No headache, No chest pain, No abdominal pain - No Nausea, No new weakness tingling or numbness, No Cough - SOB. Complaint of left hip pain.  Assessment & Plan    Active Problems:   Femoral neck fracture   Hip fracture  Hip fracture -Status post surgical repair 10/12/14 -Continue with when necessary pain and nausea medicine  History of thyroid cancer with iatrogenic hypothyroidism -TSH level is extremely low, so we'll hold Synthroid supplement currently, and resume later at a lower dose  Code Status: full  Family Communication: family at bedside  Disposition Plan: remains inpatient, PT consult once stable   Procedures  Left hip surgical repair  10/12/14   Consults   orhtopedics   Medications  Scheduled Meds: . [START ON 10/13/2014] calcium-vitamin D  1 tablet Oral Q breakfast  . [START ON 10/13/2014] cholecalciferol  1,000 Units Oral Daily  . docusate sodium  100 mg Oral BID  . enoxaparin (LOVENOX) injection  40 mg Subcutaneous Q24H  . [START ON 10/13/2014] levothyroxine  137 mcg Oral QAC breakfast  . [START ON 10/13/2014] multivitamin with minerals  1 tablet Oral Daily  . sodium chloride  3 mL Intravenous Q12H  . sodium chloride  3 mL Intravenous Q12H   Continuous Infusions:  PRN Meds:.sodium chloride, [DISCONTINUED] acetaminophen **OR** acetaminophen (TYLENOL) oral liquid 160 mg/5 mL, acetaminophen **OR** acetaminophen, bisacodyl, HYDROcodone-acetaminophen, HYDROmorphone (DILAUDID) injection, levalbuterol, morphine injection, ondansetron **OR** ondansetron (ZOFRAN) IV, oxyCODONE, polyethylene glycol, sodium chloride  DVT Prophylaxis  Lovenox -  SCDs   Lab Results  Component Value Date   PLT 233 10/12/2014    Antibiotics   Anti-infectives    Start     Dose/Rate Route Frequency Ordered Stop   10/12/14 0600  ceFAZolin (ANCEF) IVPB 2 g/50 mL premix     2 g100 mL/hr over 30 Minutes Intravenous  Once 10/12/14 0044 10/12/14 1113          Objective:   Filed Vitals:   10/12/14 1345 10/12/14 1400 10/12/14 1415 10/12/14 1445  BP: 126/57 147/59  133/53 143/57  Pulse: 70 69 68   Temp:   98.7 F (37.1 C) 97.8 F (36.6 C)  TempSrc:    Oral  Resp: 14 13 13 18   Height:      Weight:      SpO2: 94% 96% 94% 99%    Wt Readings from Last 3 Encounters:  10/11/14 60.102 kg (132 lb 8 oz)  03/09/14 56.7 kg (125 lb)     Intake/Output Summary (Last 24 hours) at 10/12/14 1519 Last data filed at 10/12/14 1228  Gross per 24 hour  Intake   2060 ml  Output    350 ml  Net   1710 ml     Physical Exam  Awake Alert, Oriented X 3, No new F.N deficits, Normal affect [place].AT,PERRAL Supple Neck,No JVD, No cervical  lymphadenopathy appriciated.  Symmetrical Chest wall movement, Good air movement bilaterally, CTAB RRR,No Gallops,Rubs or new Murmurs, No Parasternal Heave +ve B.Sounds, Abd Soft, No tenderness, No organomegaly appriciated, No rebound - guarding or rigidity. No Cyanosis, Clubbing or edema, No new Rash or bruise  Good pulses bilaterally in lower extremities.   Data Review   Micro Results Recent Results (from the past 240 hour(s))  Surgical pcr screen     Status: Abnormal   Collection Time: 10/12/14  9:24 AM  Result Value Ref Range Status   MRSA, PCR NEGATIVE NEGATIVE Final   Staphylococcus aureus POSITIVE (A) NEGATIVE Final    Comment:        The Xpert SA Assay (FDA approved for NASAL specimens in patients over 46 years of age), is one component of a comprehensive surveillance program.  Test performance has been validated by [employer] for patients greater than or equal to 68 year old. It is not intended to diagnose infection nor to guide or monitor treatment.     Radiology Reports Dg Hip Complete Left  10/11/2014   CLINICAL DATA:  Walking to the bathroom and fell when the wheel of her walker got caught on the carpet. Pain in the left hip.  EXAM: LEFT HIP - COMPLETE 2+ VIEW  COMPARISON:  06/28/2009.  CT pelvis 04/16/2014  FINDINGS: There is an acute femoral neck fracture on the left, displaced almost 2 cm. No intertrochanteric component. There is sclerosis of the sacrum related to healing sacral fractures. There is widening and fragmentation of the symphysis pubis region related to subacute symphyseal region fractures with apparent nonunion and fragmentation.  IMPRESSION: Acute left femoral neck fracture displaced 2 cm.  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  Healing sacral fractures with sclerotic change.   Electronically Signed   By: Nelson Chimes M.D.   On: 10/11/2014 17:19    CBC  Recent Labs Lab 10/11/14 1559 10/11/14 1929  10/12/14 0610  WBC 12.4* 9.5 7.4  HGB 12.4 11.5* 11.5*  HCT 38.0 34.3* 34.7*  PLT 274 250 233  MCV 90.3 88.9 89.0  MCH 29.5 29.8 29.5  MCHC 32.6 33.5 33.1  RDW 14.1 14.0 14.2  LYMPHSABS 0.3*  --   --   MONOABS 0.7  --   --   EOSABS 0.0  --   --   BASOSABS 0.0  --   --     Chemistries   Recent Labs Lab 10/11/14 1559 10/11/14 1929 10/12/14 0610  NA 132*  --  132*  K 3.9  --  3.9  CL 96  --  97  CO2 22  --  24  GLUCOSE 160*  --  112*  BUN 21  --  14  CREATININE 0.69 0.65 0.65  CALCIUM 9.5  --  8.8  AST  --   --  22  ALT  --   --  13  ALKPHOS  --   --  78  BILITOT  --   --  0.8   ------------------------------------------------------------------------------------------------------------------ estimated creatinine clearance is 44.4 mL/min (by C-G formula based on Cr of 0.65). ------------------------------------------------------------------------------------------------------------------  Recent Labs  10/11/14 1929  HGBA1C 5.7*   ------------------------------------------------------------------------------------------------------------------ No results for input(s): CHOL, HDL, LDLCALC, TRIG, CHOLHDL, LDLDIRECT in the last 72 hours. ------------------------------------------------------------------------------------------------------------------  Recent Labs  10/11/14 2027  TSH 0.063*   ------------------------------------------------------------------------------------------------------------------ No results for input(s): VITAMINB12, FOLATE, FERRITIN, TIBC, IRON, RETICCTPCT in the last 72 hours.  Coagulation profile  Recent Labs Lab 10/11/14 1559 10/12/14 0610  INR 1.04 1.14    No results for input(s): DDIMER in the last 72 hours.  Cardiac Enzymes  Recent Labs Lab 10/11/14 1929 10/11/14 2027 10/12/14 0610  TROPONINI <0.30 <0.30 <0.30    ------------------------------------------------------------------------------------------------------------------ Invalid input(s): POCBNP     Time Spent in minutes  25 minutes   Daphnee Preiss M.D on 10/12/2014 at 3:19 PM  Between 7am to 7pm - Pager - [phone]  After 7pm go to [email or web address] - password [email or web address]  And look for the night coverage person covering for me after hours  [facility] Office  [phone]   **Disclaimer: This note may have been dictated with voice recognition software. Similar sounding words can inadvertently be transcribed and this note may contain transcription errors which may not have been corrected upon publication of note.**

## 2014-10-12 NOTE — Anesthesia Procedure Notes (Signed)
Procedure Name: Intubation Date/Time: 10/12/2014 11:15 AM Performed by: Marinda Elk A Pre-anesthesia Checklist: Patient identified, Timeout performed, Emergency Drugs available, Suction available and Patient being monitored Patient Re-evaluated:Patient Re-evaluated prior to inductionOxygen Delivery Method: Circle system utilized Preoxygenation: Pre-oxygenation with 100% oxygen Intubation Type: IV induction Ventilation: Mask ventilation without difficulty Laryngoscope Size: Mac and 3 Grade View: Grade I Tube type: Oral Tube size: 7.5 mm Number of attempts: 1 Airway Equipment and Method: Stylet Placement Confirmation: ETT inserted through vocal cords under direct vision,  breath sounds checked- equal and bilateral and positive ETCO2 Secured at: 21 cm Tube secured with: Tape Dental Injury: Teeth and Oropharynx as per pre-operative assessment

## 2014-10-12 NOTE — Transfer of Care (Signed)
Immediate Anesthesia Transfer of Care Note  Patient: Jackie [name]  Procedure(s) Performed: Procedure(s): ARTHROPLASTY BIPOLAR HIP (Left)  Patient Location: PACU  Anesthesia Type:General  Level of Consciousness: awake  Airway & Oxygen Therapy: Patient Spontanous Breathing and Patient connected to nasal cannula oxygen  Post-op Assessment: Report given to PACU RN and Post -op Vital signs reviewed and stable  Post vital signs: Reviewed and stable  Complications: No apparent anesthesia complications

## 2014-10-12 NOTE — Op Note (Signed)
Jackie [name], Jackie [name]               ACCOUNT NO.:  [id number]  MEDICAL RECORD NO.:  [id number]  LOCATION:  5N28C                        FACILITY:  [facility]  PHYSICIAN:  Hatcher Froning C. Lorin Mercy, M.D.    DATE OF BIRTH:  10-31-1932  DATE OF PROCEDURE:  10/12/2014 DATE OF DISCHARGE:                              OPERATIVE REPORT   PREOPERATIVE DIAGNOSIS:  Displaced femoral neck fracture left hip.  POSTOPERATIVE DIAGNOSIS:  Displaced femoral neck fracture left hip.  PROCEDURE:  Left monopolar press-fit hemiarthroplasty [place] Basic #4 stem +0 neck, 44 mm ball.  SURGEON:  Dusten Ellinwood C. Lorin Mercy, M.D.  ANESTHESIA:  General.  ESTIMATED BLOOD LOSS:  Less than 200 mL.  DRAINS:  None.  INDICATIONS:  An 80 year old female with history of falls with a pubic rami fracture in the past.  She may have had a stress fracture at the SI joint, insufficiency of fracture with sclerosis noted on x-rays, taken now lining and in comparison to previous films done in May.  She had a displaced femoral neck fracture and was brought in for operative fixation.  DESCRIPTION OF PROCEDURE:  After prepping and draping, the patient was placed in the lateral position.  Ancef prophylaxis, Foley catheter was placed.  Usual split sheets, drapes were applied.  Posterior approach was made after time-out procedure, piriformis was tagged.  Neck was cut 1 fingerbreadth above the lesser trochanter.  Sequential reaming broaching up to a #4 which gave a nice tight fit.  +0 trial head ball was measured at 44.  There was excellent suction fit.  Irrigation, no bone was left in the socket.  Permanent stem was inserted #4+ 0 ball 44 mm monopolar head.  Sciatic nerve was protected.  Bone hook was used to hook the neck and reduce the hip with identical leg lengths and good position and good stability.  Copious irrigation, piriformis repaired back to gluteus medius.  Tensor fascia and gluteus maximus repaired with running suture with 2-0 Vicryl and  subcutaneous tissue, skin staple closure and postop dressing.  Instrument count and needle count was correct.  The patient was transferred to recovery room in stable condition.    Ellese Julius C. Lorin Mercy, M.D.    MCY/MEDQ  D:  10/12/2014  T:  10/12/2014  Job:  [id number]

## 2014-10-13 ENCOUNTER — Encounter (HOSPITAL_COMMUNITY): Payer: Self-pay | Admitting: Orthopaedic Surgery

## 2014-10-13 LAB — BASIC METABOLIC PANEL
Anion gap: 11 (ref 5–15)
BUN: 13 mg/dL (ref 6–23)
CO2: 24 mEq/L (ref 19–32)
Calcium: 8.5 mg/dL (ref 8.4–10.5)
Chloride: 100 mEq/L (ref 96–112)
Creatinine, Ser: 0.73 mg/dL (ref 0.50–1.10)
GFR calc Af Amer: >90 mL/min (ref >90)
GFR calc non Af Amer: 79 mL/min — ABNORMAL LOW (ref >90)
Glucose, Bld: 88 mg/dL (ref 70–99)
Potassium: 4.4 mEq/L (ref 3.7–5.3)
Sodium: 135 mEq/L — ABNORMAL LOW (ref 137–147)

## 2014-10-13 LAB — CBC
HCT: 29.5 % — ABNORMAL LOW (ref 36.0–46.0)
Hemoglobin: 10 g/dL — ABNORMAL LOW (ref 12.0–15.0)
MCH: 30.6 pg (ref 26.0–34.0)
MCHC: 33.9 g/dL (ref 30.0–36.0)
MCV: 90.2 fL (ref 78.0–100.0)
Platelets: 203 10*3/uL (ref 150–400)
RBC: 3.27 MIL/uL — ABNORMAL LOW (ref 3.87–5.11)
RDW: 14.3 % (ref 11.5–15.5)
WBC: 6.8 10*3/uL (ref 4.0–10.5)

## 2014-10-13 MED ORDER — ASPIRIN EC 325 MG PO TBEC: 325.0000 mg | DELAYED_RELEASE_TABLET | Freq: Every day | ORAL | Status: DC

## 2014-10-13 MED ORDER — ENOXAPARIN SODIUM 30 MG/0.3ML ~~LOC~~ SOLN
30.0000 mg | Freq: Every day | SUBCUTANEOUS | Status: DC
  Administered 2014-10-13 – 2014-10-15 (×3): 30 mg via SUBCUTANEOUS
  Filled 2014-10-13 (×4): qty 0.3

## 2014-10-13 MED ORDER — ASPIRIN EC 81 MG PO TBEC
81.0000 mg | DELAYED_RELEASE_TABLET | Freq: Every day | ORAL | Status: DC
Start: 2014-10-14 — End: 2014-10-15
  Administered 2014-10-14 – 2014-10-15 (×2): 81 mg via ORAL
  Filled 2014-10-13 (×2): qty 1

## 2014-10-13 MED ORDER — LEVOTHYROXINE SODIUM 125 MCG PO TABS: 125.0000 ug | ORAL_TABLET | Freq: Every day | ORAL | Status: DC

## 2014-10-13 MED ORDER — MUPIROCIN 2 % EX OINT
1.0000 "application " | TOPICAL_OINTMENT | Freq: Two times a day (BID) | CUTANEOUS | Status: DC
  Administered 2014-10-13 – 2014-10-15 (×5): 1 via NASAL
  Filled 2014-10-13: qty 22

## 2014-10-13 MED ORDER — ASPIRIN EC 325 MG PO TBEC
325.0000 mg | DELAYED_RELEASE_TABLET | Freq: Every day | ORAL | Status: DC
  Administered 2014-10-13: 325 mg via ORAL
  Filled 2014-10-13: qty 1

## 2014-10-13 MED ORDER — LEVOTHYROXINE SODIUM 75 MCG PO TABS
112.5000 ug | ORAL_TABLET | Freq: Every day | ORAL | Status: DC
  Administered 2014-10-14 – 2014-10-15 (×2): 112.5 ug via ORAL
  Filled 2014-10-13 (×4): qty 1.5

## 2014-10-13 MED ORDER — HYDROCODONE-ACETAMINOPHEN 5-325 MG PO TABS: 1.0000 | ORAL_TABLET | ORAL | Status: DC | PRN

## 2014-10-13 MED ORDER — CHLORHEXIDINE GLUCONATE CLOTH 2 % EX PADS
6.0000 | MEDICATED_PAD | Freq: Every day | CUTANEOUS | Status: DC
  Administered 2014-10-13 – 2014-10-15 (×3): 6 via TOPICAL

## 2014-10-13 NOTE — Clinical Social Work Psychosocial (Signed)
Clinical Social Work Department BRIEF PSYCHOSOCIAL ASSESSMENT 10/13/2014  Patient:  Jackie [name], Jackie [name]     Account Number:  [id number]     Admit date:  10/11/2014  Clinical Social Worker:  Delrae Sawyers  Date/Time:  10/13/2014 12:48 PM  Referred by:  Physician  Date Referred:  10/13/2014 Referred for  SNF Placement   Other Referral:   none.   Interview type:  Patient Other interview type:   none.    PSYCHOSOCIAL DATA Living Status:  ALONE Admitted from facility:   Level of care:   Primary support name:  Jackie [name] Primary support relationship to patient:  CHILD, ADULT Degree of support available:   Strong support system. Pt's son lives in [place], [place]. Pt also has daughter, Jackie [name], who is a strong support system but lives in [place], [place].    CURRENT CONCERNS Current Concerns  Post-Acute Placement   Other Concerns:   none.    SOCIAL WORK ASSESSMENT / PLAN CSW received referral for possible SNF placement at time of discharge. CSW met with pt at bedside to discuss discharge disposition. Pt informed CSW pt would prefer placement at [facility] at time of discharge. Pt informed CSW pt has support from friends and her grandson in the [place], [place] area.    CSW to continue to follow and assist with discharge planning needs.   Assessment/plan status:  Psychosocial Support/Ongoing Assessment of Needs Other assessment/ plan:   none.   Information/referral to community resources:   [facility] bed offers. [facility] admissions liaison updated regarding pt's preference.    PATIENT'S/FAMILY'S RESPONSE TO PLAN OF CARE: Pt understanding and agreeable to CSW plan of care. Pt expressed concern for having to stay at SNF for a certain number of days (determined by pt's insurance). CSW requested [facility] admissions liaison discuss pt's concerns with pt. [facility] admissions liaison to meet with pt at bedside on either 11/16 evening or  11/17 morning to address concerns. No other concerns were noted.       Lubertha Sayres, [place] ([phone]) Licensed Clinical Social Worker Orthopedics [phone]) and Surgical [phone])

## 2014-10-13 NOTE — Evaluation (Signed)
Physical Therapy Evaluation Patient Details Name: Jackie [name] MRN: [id number] DOB: 08/01/1932 Today's Date: 10/13/2014   History of Present Illness  80 y/o female fell at home after bearing down on walker that was not locked and suffered L hip fracture.  Pt s/p L arthroplasty.  pt had pelvic and T spine fx in May.  Clinical Impression  Pt admitted with L hip fracture and is now s/p L  Bipolar hip arthroplasty. Pt currently with functional limitations due to the deficits listed below (see PT Problem List). Pt will benefit from skilled PT to increase their independence and safety with mobility to allow discharge to the venue listed below. Pt lives alone and can have family A only intermittently.  Pt would like to return home and family would like pt to go to short term SNF.  At this time recommend SNF based on living alone and hip precautions.  If pt progresses well enough to go home or if she refuses SNF, then will need HHPT.  Will continue to assess mobility.     Follow Up Recommendations SNF;Home health PT    Equipment Recommendations  None recommended by PT    Recommendations for Other Services       Precautions / Restrictions        Mobility  Bed Mobility Overal bed mobility: Needs Assistance;+ 2 for safety/equipment Bed Mobility: Supine to Sit     Supine to sit: Mod assist     General bed mobility comments: Difficulty getting hips turned, but with good effort.   Transfers Overall transfer level: Needs assistance Equipment used: Rolling walker (2 wheeled) Transfers: Sit to/from [facility] Sit to Stand: Min assist Stand pivot transfers: Mod assist       General transfer comment: cueing for hand placement  Ambulation/Gait Ambulation/Gait assistance: Min assist Ambulation Distance (Feet): 25 Feet Assistive device: Rolling walker (2 wheeled) Gait Pattern/deviations: Step-through pattern;Decreased stance time - left;Decreased step length -  right;Decreased step length - left     General Gait Details: Pt able to ambulate with step through pattern and needed cues for proper placement of RW.  Stairs            Wheelchair Mobility    Modified Rankin (Stroke Patients Only)       Balance Overall balance assessment: Needs assistance   Sitting balance-Leahy Scale: Good     Standing balance support: Bilateral upper extremity supported Standing balance-Leahy Scale: Poor                               Pertinent Vitals/Pain Pain Assessment: 0-10 Pain Score: 4     Home Living Family/patient expects to be discharged to:: Private residence Living Arrangements: Alone Available Help at Discharge: Available PRN/intermittently;Family Type of Home: House Home Access: Stairs to enter Entrance Stairs-Rails: Right Entrance Stairs-Number of Steps: 2 Home Layout: One level Home Equipment: Walker - 2 wheels;Cane - single point;Bedside commode;Grab bars - tub/shower      Prior Function Level of Independence: Independent with assistive device(s)         Comments: Amb with cane in community and at home cruises furniture     Hand Dominance        Extremity/Trunk Assessment   Upper Extremity Assessment: Defer to OT evaluation           Lower Extremity Assessment: LLE deficits/detail   LLE Deficits / Details: Able to tolerate some AAROM of L  hip  Cervical / Trunk Assessment: Normal  Communication      Cognition Arousal/Alertness: Awake/alert Behavior During Therapy: WFL for tasks assessed/performed Overall Cognitive Status: Within Functional Limits for tasks assessed                      General Comments General comments (skin integrity, edema, etc.): Pt is motivated to return to PLOF and daughter, from [place], present. Educated on hip precautions.    Exercises Total Joint Exercises Ankle Circles/Pumps: Both;Supine;10 reps Gluteal Sets: 5 reps;Supine Heel Slides: AAROM;Left;5  reps;Supine Hip ABduction/ADduction: AAROM;Left;5 reps;Supine      Assessment/Plan    PT Assessment Patient needs continued PT services  PT Diagnosis Difficulty walking;Acute pain   PT Problem List Decreased strength;Decreased range of motion;Decreased balance;Decreased mobility;Decreased activity tolerance  PT Treatment Interventions DME instruction;Gait training;Functional mobility training;Therapeutic activities;Therapeutic exercise;Balance training   PT Goals (Current goals can be found in the Care Plan section) Acute Rehab PT Goals Patient Stated Goal: Pt would like to return home and family would like pt to go to short term rehab before returning home. PT Goal Formulation: With patient/family Time For Goal Achievement: 10/20/14 Potential to Achieve Goals: Good    Frequency Min 5X/week   Barriers to discharge        Co-evaluation               End of Session Equipment Utilized During Treatment: Gait belt Activity Tolerance: Patient tolerated treatment well Patient left: in chair;with call bell/phone within reach;with family/visitor present Nurse Communication: Patient requests pain meds         Time: 7262-0355 PT Time Calculation (min) (ACUTE ONLY): 34 min   Charges:   PT Evaluation $Initial PT Evaluation Tier I: 1 Procedure PT Treatments $Gait Training: 8-22 mins $Therapeutic Exercise: 8-22 mins   PT G Codes:          Reesa Gotschall LUBECK 10/13/2014, 11:01 AM

## 2014-10-13 NOTE — Clinical Social Work Placement (Addendum)
Clinical Social Work Department CLINICAL SOCIAL WORK PLACEMENT NOTE 10/13/2014  Patient:  Jackie [name], Jackie [name]  Account Number:  [id number] Admit date:  10/11/2014  Clinical Social Worker:  Delrae Sawyers  Date/time:  10/13/2014 12:53 PM  Clinical Social Work is seeking post-discharge placement for this patient at the following level of care:   [place]   (*CSW will update this form in Epic as items are completed)   10/13/2014  Patient/family provided with [facility] Department of Clinical Social Work's list of facilities offering this level of care within the geographic area requested by the patient (or if unable, by the patient's family).  10/13/2014  Patient/family informed of their freedom to choose among providers that offer the needed level of care, that participate in Medicare, Medicaid or managed care program needed by the patient, have an available bed and are willing to accept the patient.  10/13/2014  Patient/family informed of [facility]' ownership interest in [facility], as well as of the fact that they are under no obligation to receive care at this facility.  PASARR submitted to EDS on 10/13/2014 PASARR number received on 10/13/2014  FL2 transmitted to all facilities in geographic area requested by pt/family on  10/13/2014 FL2 transmitted to all facilities within larger geographic area on   Patient informed that his/her managed care company has contracts with or will negotiate with  certain facilities, including the following:     Patient/family informed of bed offers received:  10/13/2014 Patient chooses bed at [facility] Physician recommends and patient chooses bed at    Patient to be transferred to  [facility] on  10/15/2014 Patient to be transferred to facility by [facility] Patient and family notified of transfer on 10/15/2014 Name of family member notified:  Pt, pt's daughter, and pt's son updated regarding discharge.  The  following physician request were entered in Epic:   Additional Comments:  [facility] [place] ([phone]) Licensed Clinical Social Worker Orthopedics [phone]) and Surgical [phone])

## 2014-10-13 NOTE — Progress Notes (Signed)
Subjective: 1 Day Post-Op Procedure(s) (LRB): ARTHROPLASTY BIPOLAR HIP (Left) Patient reports pain as mild.   She reports pain with swallowiing and thought calcium pill got stuck.  She has drank water and eaten toast without problems.  Likely sore from ET tube at surgery. . Objective: Vital signs in last 24 hours: Temp:  [97.7 F (36.5 C)-98.7 F (37.1 C)] 98.7 F (37.1 C) (11/16 0526) Pulse Rate:  [68-79] 75 (11/16 0526) Resp:  [13-18] 16 (11/16 0800) BP: (118-147)/(52-65) 124/53 mmHg (11/16 0526) SpO2:  [92 %-99 %] 95 % (11/16 0526)  Intake/Output from previous day: 11/15 0701 - 11/16 0700 In: 1940 [P.O.:240; I.V.:1700] Out: 890 [Urine:790; Blood:100] Intake/Output this shift:     Recent Labs  10/11/14 1559 10/11/14 1929 10/12/14 0610 10/12/14 1543 10/13/14 0506  HGB 12.4 11.5* 11.5* 11.0* 10.0*    Recent Labs  10/12/14 1543 10/13/14 0506  WBC 9.4 6.8  RBC 3.72* 3.27*  HCT 33.1* 29.5*  PLT 184 203    Recent Labs  10/12/14 0610 10/13/14 0506  NA 132* 135*  K 3.9 4.4  CL 97 100  CO2 24 24  BUN 14 13  CREATININE 0.65 0.73  GLUCOSE 112* 88  CALCIUM 8.8 8.5    Recent Labs  10/11/14 1559 10/12/14 0610  INR 1.04 1.14    Neurologically intact  Assessment/Plan: 1 Day Post-Op Procedure(s) (LRB): ARTHROPLASTY BIPOLAR HIP (Left) Up with therapy Likely SNF,  Possible Home Jackie [name] C 10/13/2014, 8:23 AM

## 2014-10-13 NOTE — Progress Notes (Signed)
Utilization review completed.  

## 2014-10-13 NOTE — Progress Notes (Signed)
Patient Demographics  Jackie [name], is a 80 y.o. female, DOB - Aug 11, 1932, [id number]  Admit date - 10/11/2014   Admitting Physician Reyne Dumas, MD  Outpatient Primary MD for the patient is Sheela Stack, MD  LOS - 2   Chief Complaint  Patient presents with  . Fall      Admission history of present illness/brief narrative: 80 year old female presents after falling at home this morning. She was using her walker and the walker got stuck when going from carpet to regular floor causing her to fall. She fell on her left hip. She states she's been able to put minimal weight on it since but the pain is progressively worsened so she came in to the ER. She took 2 Tylenol without any relief. EMS gave her 150 g fentanyl with improvement of pain from a 10 to an 8. She complains of Numbness in her left ankle since the fall. Patient is developed a bruise to her left lateral thigh. Denies hitting her head or LOC.She had pelvic and T spine fractures from a fall in May and has been home since . Patient underwent left hip surgical repair 10/12/14 by Dr.Yates Subjective:   Jackie [name] today has, No headache, No chest pain, No abdominal pain - No Nausea, No new weakness tingling or numbness, No Cough - SOB. Left hip pain much improved, did ambulate with physical therapy on hold today  Assessment & Plan    Active Problems:   Femoral neck fracture   Hip fracture  Hip fracture -Status post surgical repair 10/12/14 -Continue with when necessary pain and nausea medicine -PT/OT following  History of thyroid cancer with iatrogenic hypothyroidism -TSH level is low, discussed with her endocrinologist Dr. Forde Dandy, recommendation to resume back on a lower dose, will restart patient on 112 mcg oral daily.  Code Status: full  Family Communication: family at  bedside  Disposition Plan: remains inpatient, PT consult once stable   Procedures  Left hip surgical repair 10/12/14   Consults   orhtopedics   Medications  Scheduled Meds: . aspirin EC  325 mg Oral Daily  . calcium-vitamin D  1 tablet Oral Q breakfast  . Chlorhexidine Gluconate Cloth  6 each Topical Daily  . cholecalciferol  1,000 Units Oral Daily  . docusate sodium  100 mg Oral BID  . multivitamin with minerals  1 tablet Oral Daily  . mupirocin ointment  1 application Nasal BID  . sodium chloride  3 mL Intravenous Q12H  . sodium chloride  3 mL Intravenous Q12H   Continuous Infusions:  PRN Meds:.sodium chloride, [DISCONTINUED] acetaminophen **OR** acetaminophen (TYLENOL) oral liquid 160 mg/5 mL, acetaminophen **OR** acetaminophen, bisacodyl, HYDROcodone-acetaminophen, HYDROmorphone (DILAUDID) injection, levalbuterol, morphine injection, ondansetron **OR** ondansetron (ZOFRAN) IV, oxyCODONE, polyethylene glycol, sodium chloride  DVT Prophylaxis    SCDs ,Lovenox.  Lab Results  Component Value Date   PLT 203 10/13/2014    Antibiotics   Anti-infectives    Start     Dose/Rate Route Frequency Ordered Stop   10/12/14 0600  ceFAZolin (ANCEF) IVPB 2 g/50 mL premix     2 g100 mL/hr over 30 Minutes Intravenous  Once 10/12/14 0044 10/12/14 1113          Objective:   Filed Vitals:  10/13/14 0138 10/13/14 0400 10/13/14 0526 10/13/14 0800  BP: 127/59  124/53   Pulse: 70  75   Temp: 97.7 F (36.5 C)  98.7 F (37.1 C)   TempSrc: Oral  Oral   Resp: 18 18 18 16   Height:      Weight:      SpO2: 92% 92% 95%     Wt Readings from Last 3 Encounters:  10/11/14 60.102 kg (132 lb 8 oz)  03/09/14 56.7 kg (125 lb)     Intake/Output Summary (Last 24 hours) at 10/13/14 1206 Last data filed at 10/13/14 0800  Gross per 24 hour  Intake   1180 ml  Output    540 ml  Net    640 ml     Physical Exam  Awake Alert, Oriented X 3, No new F.N deficits, Normal  affect .AT,PERRAL Supple Neck,No JVD, No cervical lymphadenopathy appriciated.  Symmetrical Chest wall movement, Good air movement bilaterally, CTAB RRR,No Gallops,Rubs or new Murmurs, No Parasternal Heave +ve B.Sounds, Abd Soft, No tenderness, No organomegaly appriciated, No rebound - guarding or rigidity. No Cyanosis, Clubbing or edema, No new Rash or bruise  Good pulses bilaterally in lower extremities.   Data Review   Micro Results Recent Results (from the past 240 hour(s))  Surgical pcr screen     Status: Abnormal   Collection Time: 10/12/14  9:24 AM  Result Value Ref Range Status   MRSA, PCR NEGATIVE NEGATIVE Final   Staphylococcus aureus POSITIVE (A) NEGATIVE Final    Comment:        The Xpert SA Assay (FDA approved for NASAL specimens in patients over 45 years of age), is one component of a comprehensive surveillance program.  Test performance has been validated by [employer] for patients greater than or equal to 72 year old. It is not intended to diagnose infection nor to guide or monitor treatment.     Radiology Reports Dg Hip Complete Left  10/11/2014   CLINICAL DATA:  Walking to the bathroom and fell when the wheel of her walker got caught on the carpet. Pain in the left hip.  EXAM: LEFT HIP - COMPLETE 2+ VIEW  COMPARISON:  06/28/2009.  CT pelvis 04/16/2014  FINDINGS: There is an acute femoral neck fracture on the left, displaced almost 2 cm. No intertrochanteric component. There is sclerosis of the sacrum related to healing sacral fractures. There is widening and fragmentation of the symphysis pubis region related to subacute symphyseal region fractures with apparent nonunion and fragmentation.  IMPRESSION: Acute left femoral neck fracture displaced 2 cm.  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  Healing sacral fractures with sclerotic change.   Electronically Signed   By: Nelson Chimes M.D.   On: 10/11/2014 17:19     CBC  Recent Labs Lab 10/11/14 1559 10/11/14 1929 10/12/14 0610 10/12/14 1543 10/13/14 0506  WBC 12.4* 9.5 7.4 9.4 6.8  HGB 12.4 11.5* 11.5* 11.0* 10.0*  HCT 38.0 34.3* 34.7* 33.1* 29.5*  PLT 274 250 233 184 203  MCV 90.3 88.9 89.0 89.0 90.2  MCH 29.5 29.8 29.5 29.6 30.6  MCHC 32.6 33.5 33.1 33.2 33.9  RDW 14.1 14.0 14.2 14.4 14.3  LYMPHSABS 0.3*  --   --   --   --   MONOABS 0.7  --   --   --   --   EOSABS 0.0  --   --   --   --   BASOSABS 0.0  --   --   --   --  Chemistries   Recent Labs Lab 10/11/14 1559 10/11/14 1929 10/12/14 0610 10/13/14 0506  NA 132*  --  132* 135*  K 3.9  --  3.9 4.4  CL 96  --  97 100  CO2 22  --  24 24  GLUCOSE 160*  --  112* 88  BUN 21  --  14 13  CREATININE 0.69 0.65 0.65 0.73  CALCIUM 9.5  --  8.8 8.5  AST  --   --  22  --   ALT  --   --  13  --   ALKPHOS  --   --  78  --   BILITOT  --   --  0.8  --    ------------------------------------------------------------------------------------------------------------------ estimated creatinine clearance is 44.4 mL/min (by C-G formula based on Cr of 0.73). ------------------------------------------------------------------------------------------------------------------  Recent Labs  10/11/14 1929  HGBA1C 5.7*   ------------------------------------------------------------------------------------------------------------------ No results for input(s): CHOL, HDL, LDLCALC, TRIG, CHOLHDL, LDLDIRECT in the last 72 hours. ------------------------------------------------------------------------------------------------------------------  Recent Labs  10/11/14 2027  TSH 0.063*   ------------------------------------------------------------------------------------------------------------------ No results for input(s): VITAMINB12, FOLATE, FERRITIN, TIBC, IRON, RETICCTPCT in the last 72 hours.  Coagulation profile  Recent Labs Lab 10/11/14 1559 10/12/14 0610  INR 1.04 1.14    No  results for input(s): DDIMER in the last 72 hours.  Cardiac Enzymes  Recent Labs Lab 10/11/14 1929 10/11/14 2027 10/12/14 0610  TROPONINI <0.30 <0.30 <0.30   ------------------------------------------------------------------------------------------------------------------ Invalid input(s): POCBNP     Time Spent in minutes  25 minutes   ELGERGAWY, DAWOOD M.D on 10/13/2014 at 12:06 PM  Between 7am to 7pm - Pager - [phone]  After 7pm go to [email or web address] - password [email or web address]  And look for the night coverage person covering for me after hours  [facility] Office  [phone]   **Disclaimer: This note may have been dictated with voice recognition software. Similar sounding words can inadvertently be transcribed and this note may contain transcription errors which may not have been corrected upon publication of note.**

## 2014-10-13 NOTE — Discharge Instructions (Signed)
Keep hip incision dry for 7 days post op then may wet while bathing. If no wound drainage. Change dressing daily or as needed. Ice packs to hip as needed for pain and swelling. Therapy daily for ambulation and gait training weight bearing as tolerated with walker. Call if fever or chills or increased drainage. Go to ER if acutely short of breath or call for ambulance. Return for follow up in 2 weeks. In house walking for first 2 weeks.

## 2014-10-13 NOTE — Care Management Note (Addendum)
CARE MANAGEMENT NOTE 10/13/2014  Patient:  Jackie [name], Jackie [name]   Account Number:  [id number]  Date Initiated:  10/13/2014  Documentation initiated by:  Ricki Miller  Subjective/Objective Assessment:   80 yr old female admited with a left hip fracture. patient had a left hip hemiarthroplasty.     Action/Plan:   Patient will need shortterm rehab at [facility]. Social worker is aware.   Anticipated DC Date:  10/14/2014   Anticipated DC Plan:  SKILLED NURSING FACILITY  In-house referral  Clinical Social Worker      DC Planning Services  CM consult      [facility] Choice  NA   Choice offered to / List presented to:     DME arranged  NA        [place] arranged  NA      Status of service:  Completed, signed off Medicare Important Message given?   (If response is "NO", the following Medicare IM given date fields will be blank) Date Medicare IM given:   Medicare IM given by:   Date Additional Medicare IM given:   Additional Medicare IM given by:    Discharge Disposition:  [place]  Per UR Regulation:  Reviewed for med. necessity/level of care/duration of stay  If discussed at [place] of Stay Meetings, dates discussed:    Comments:

## 2014-10-14 ENCOUNTER — Inpatient Hospital Stay (HOSPITAL_COMMUNITY): Payer: Medicare Other

## 2014-10-14 LAB — CBC
HCT: 26.5 % — ABNORMAL LOW (ref 36.0–46.0)
Hemoglobin: 8.7 g/dL — ABNORMAL LOW (ref 12.0–15.0)
MCH: 28.9 pg (ref 26.0–34.0)
MCHC: 32.8 g/dL (ref 30.0–36.0)
MCV: 88 fL (ref 78.0–100.0)
Platelets: 209 10*3/uL (ref 150–400)
RBC: 3.01 MIL/uL — ABNORMAL LOW (ref 3.87–5.11)
RDW: 14.4 % (ref 11.5–15.5)
WBC: 6.6 10*3/uL (ref 4.0–10.5)

## 2014-10-14 LAB — BASIC METABOLIC PANEL
Anion gap: 10 (ref 5–15)
BUN: 11 mg/dL (ref 6–23)
CO2: 26 mEq/L (ref 19–32)
Calcium: 8.5 mg/dL (ref 8.4–10.5)
Chloride: 97 mEq/L (ref 96–112)
Creatinine, Ser: 0.67 mg/dL (ref 0.50–1.10)
GFR calc Af Amer: >90 mL/min (ref >90)
GFR calc non Af Amer: 81 mL/min — ABNORMAL LOW (ref >90)
Glucose, Bld: 110 mg/dL — ABNORMAL HIGH (ref 70–99)
Potassium: 4.3 mEq/L (ref 3.7–5.3)
Sodium: 133 mEq/L — ABNORMAL LOW (ref 137–147)

## 2014-10-14 LAB — URINE CULTURE: Colony Count: 50000

## 2014-10-14 MED ORDER — FERROUS SULFATE 220 (44 FE) MG/5ML PO ELIX
220.0000 mg | ORAL_SOLUTION | Freq: Three times a day (TID) | ORAL | Status: DC
  Administered 2014-10-14 – 2014-10-15 (×4): 220 mg via ORAL
  Filled 2014-10-14 (×6): qty 5

## 2014-10-14 NOTE — Progress Notes (Signed)
Physical Therapy Treatment Patient Details Name: Jackie [name] MRN: [id number] DOB: 16-May-1932 Today's Date: 10/14/2014    History of Present Illness 80 y/o female fell at home after bearing down on walker that was not locked and suffered L hip fracture.  Pt s/p L arthroplasty.  pt had pelvic and T spine fx in May.    PT Comments    Pt is agreeable to SNF now rather than home with HHPT.  Pt should continue to progress well with acute and SNF rehab to eventually return home.  Pt recalled 2/3 hip precautions with verbal cueing.  Follow Up Recommendations  SNF     Equipment Recommendations  None recommended by PT    Recommendations for Other Services       Precautions / Restrictions Precautions Precautions: Fall;Posterior Hip Precaution Comments: Reviewed precautions.  Recalled 2/3 forgetting no IR Restrictions Weight Bearing Restrictions: No LLE Weight Bearing: Weight bearing as tolerated    Mobility  Bed Mobility Overal bed mobility: Needs Assistance Bed Mobility: Supine to Sit     Supine to sit: Min assist;HOB elevated     General bed mobility comments: up in bathroom with PT upon arrival.  Transfers Overall transfer level: Needs assistance Equipment used: Rolling walker (2 wheeled) Transfers: Sit to/from Stand Sit to Stand: Min assist            Ambulation/Gait   Ambulation Distance (Feet): 75 Feet (x 2 reps) Assistive device: Rolling walker (2 wheeled) Gait Pattern/deviations: Step-through pattern;Decreased step length - right     General Gait Details: Keeps head down with gait and needs cues to look up.  Good cadence.   Stairs            Wheelchair Mobility    Modified Rankin (Stroke Patients Only)       Balance Overall balance assessment: Needs assistance   Sitting balance-Leahy Scale: Good       Standing balance-Leahy Scale: Poor                      Cognition Arousal/Alertness: Awake/alert Behavior During Therapy:  WFL for tasks assessed/performed Overall Cognitive Status: Within Functional Limits for tasks assessed                      Exercises Total Joint Exercises Ankle Circles/Pumps: AROM;Both;Seated Gluteal Sets: 10 reps;Strengthening Hip ABduction/ADduction: AAROM;Left;Seated;10 reps Long Arc Quad: Strengthening;Left;10 reps    General Comments        Pertinent Vitals/Pain Pain Assessment: 0-10 Pain Score: 4  Pain Location: L hip Pain Descriptors / Indicators: Aching Pain Intervention(s): Patient requesting pain meds-RN notified;Monitored during session    Home Living Family/patient expects to be discharged to:: Skilled nursing facility                    Prior Function            PT Goals (current goals can now be found in the care plan section) Acute Rehab PT Goals Patient Stated Goal: to rehab then home PT Goal Formulation: With patient/family Time For Goal Achievement: 10/20/14 Potential to Achieve Goals: Good Progress towards PT goals: Progressing toward goals    Frequency  Min 5X/week    PT Plan Discharge plan needs to be updated    Co-evaluation             End of Session Equipment Utilized During Treatment: Gait belt Activity Tolerance: Patient tolerated treatment well Patient left: in chair;with  call bell/phone within reach;with nursing/sitter in room;with family/visitor present     Time: 2263-3354 PT Time Calculation (min) (ACUTE ONLY): 18 min  Charges:  $Gait Training: 8-22 mins                    G Codes:      Amoura Ransier LUBECK 10/14/2014, 9:56 AM

## 2014-10-14 NOTE — Progress Notes (Signed)
Subjective: 2 Days Post-Op Procedure(s) (LRB): ARTHROPLASTY BIPOLAR HIP (Left) Patient reports pain as mild.    Objective: Vital signs in last 24 hours: Temp:  [97.8 F (36.6 C)-99.4 F (37.4 C)] 99.4 F (37.4 C) (11/17 0520) Pulse Rate:  [66-87] 87 (11/17 0520) Resp:  [16-18] 16 (11/17 0520) BP: (129-164)/(61-67) 164/67 mmHg (11/17 0520) SpO2:  [95 %-99 %] 97 % (11/17 0520)  Intake/Output from previous day: 11/16 0701 - 11/17 0700 In: 480 [P.O.:480] Out: 550 [Urine:550] Intake/Output this shift:     Recent Labs  10/11/14 1929 10/12/14 0610 10/12/14 1543 10/13/14 0506 10/14/14 0522  HGB 11.5* 11.5* 11.0* 10.0* 8.7*    Recent Labs  10/13/14 0506 10/14/14 0522  WBC 6.8 6.6  RBC 3.27* 3.01*  HCT 29.5* 26.5*  PLT 203 209    Recent Labs  10/13/14 0506 10/14/14 0522  NA 135* 133*  K 4.4 4.3  CL 100 97  CO2 24 26  BUN 13 11  CREATININE 0.73 0.67  GLUCOSE 88 110*  CALCIUM 8.5 8.5    Recent Labs  10/11/14 1559 10/12/14 0610  INR 1.04 1.14    Neurologically intact  Assessment/Plan: 2 Days Post-Op Procedure(s) (LRB): ARTHROPLASTY BIPOLAR HIP (Left) Up with therapy  Has voided in bed, sometimes she does not know when she was going to void.  Wants to go to [facility].   YATES,MARK C 10/14/2014, 8:04 AM

## 2014-10-14 NOTE — Plan of Care (Signed)
Problem: Phase I Progression Outcomes Goal: Pain controlled with appropriate interventions Outcome: Completed/Met Date Met:  10/14/14 Goal: OOB as tolerated unless otherwise ordered Outcome: Completed/Met Date Met:  10/14/14 Goal: Incision/dressings dry and intact Outcome: Completed/Met Date Met:  10/14/14 Goal: Sutures/staples intact Outcome: Not Applicable Date Met:  02/09/93 Goal: Tubes/drains patent Outcome: Not Applicable Date Met:  58/59/29 Goal: Initial discharge plan identified Outcome: Completed/Met Date Met:  10/14/14 Goal: Voiding-avoid urinary catheter unless indicated Outcome: Completed/Met Date Met:  10/14/14 Goal: Vital signs/hemodynamically stable Outcome: Completed/Met Date Met:  10/14/14 Goal: Other Phase I Outcomes/Goals Outcome: Not Applicable Date Met:  24/46/28

## 2014-10-14 NOTE — Plan of Care (Signed)
Problem: Phase I Progression Outcomes Goal: Pre op pain controlled with appropriate interventions Outcome: Progressing Goal: Pre op Medical MD consult, if indicated Outcome: Progressing Goal: Pre op labs/procedures/consults per MD order Outcome: Progressing Goal: Pre op Protime within normal limits Outcome: Progressing Goal: Pre op NPO per MD orders Outcome: Progressing Goal: Pre op-initial discharge plan identified Outcome: Progressing Goal: Pre op hemodynamically stable Outcome: Progressing

## 2014-10-14 NOTE — Evaluation (Signed)
Clinical/Bedside Swallow Evaluation Patient Details  Name: KAYLYNNE ANDRES MRN: [id number] Date of Birth: 07/09/1932  Today's Date: 10/14/2014 Time: 1130-1205 SLP Time Calculation (min) (ACUTE ONLY): 35 min  Past Medical History:  Past Medical History  Diagnosis Date  . Thyroid disease   . Cancer    Past Surgical History:  Past Surgical History  Procedure Laterality Date  . Total thyroidectomy    . Hip arthroplasty Left 10/12/2014    Procedure: ARTHROPLASTY BIPOLAR HIP;  Surgeon: Marybelle Killings, MD;  Location: [facility];  Service: Orthopedics;  Laterality: Left;   HPI:  80 y/o female fell at home after bearing down on walker that was not locked and suffered L hip fracture.  Patient with difficulty swallowing large pills, documented 11/16 and as a result order recieved for BSE.     Assessment / Plan / Recommendation Clinical Impression  Orders received; bedside swallow evaluation completed.  Oral motor exam revealed WFL range of motion, strength and sensation.  Pharyngeal phase of swallow had timely initiation with good hyolaryngeal elevation.  Trials of regular textures and thin liquids were unremarkable following oral expectoration of thick, yellow secretions.  She reports biggest hindrance being globus sensation with pills with eventual regurgitation as a result, SLP suspects symptoms are related to esophageal function.  Recommend to continue with current diet orders, MD please consider adding something to assist with mobilizing secretions as well as an objective esophageal assessment.  No further acute needs at this time, SLP signing off.         Aspiration Risk  Mild    Diet Recommendation Regular;Thin liquid   Liquid Administration via: Cup;Straw Medication Administration: Whole meds with puree Supervision: Patient able to self feed Postural Changes and/or Swallow Maneuvers: Seated upright 90 degrees;Upright 30-60 min after meal    Other  Recommendations Recommended Consults:  Consider esophageal assessment Oral Care Recommendations: Oral care BID   Follow Up Recommendations  None            Pertinent Vitals/Pain None     Swallow Study Prior Functional Status  Regular and thin with daughter reporting patient had difficulty swallowing pills prior to admission.      General HPI: 80 y/o female fell at home after bearing down on walker that was not locked and suffered L hip fracture.  Patient with difficulty swallowing large pills, documented 11/16 and as a result order recieved for BSE.   Type of Study: Bedside swallow evaluation Previous Swallow Assessment: none on record Diet Prior to this Study: Regular;Thin liquids Temperature Spikes Noted: No Respiratory Status: Nasal cannula History of Recent Intubation: Yes (for surgery ) Behavior/Cognition: Alert;Cooperative;Pleasant mood;Other (comment) (appeared and reported being anxious) Oral Cavity - Dentition: Adequate natural dentition Self-Feeding Abilities: Able to feed self Patient Positioning: Upright in bed Baseline Vocal Quality: Clear Volitional Cough: Strong Volitional Swallow: Able to elicit    Oral/Motor/Sensory Function Overall Oral Motor/Sensory Function: Appears within functional limits for tasks assessed   Ice Chips Ice chips: Not tested   Thin Liquid Thin Liquid: Within functional limits Presentation: Self Fed;Straw Other Comments: patient with initial cough response after sips but was able to orally expectorate lots of thick, yellow secreations and then no further cough response was observed    Nectar Thick Nectar Thick Liquid: Not tested   Honey Thick Honey Thick Liquid: Not tested   Puree Puree: Within functional limits Presentation: Self Fed   Solid   GO    Solid: Within functional limits Presentation: Self Fed  Gunnar Fusi, M.A., [place] 10/14/2014,2:13 PM

## 2014-10-14 NOTE — Evaluation (Signed)
Occupational Therapy Evaluation and Discharge Patient Details Name: Jackie [name] MRN: [id number] DOB: 21-Feb-1932 Today's Date: 10/14/2014    History of Present Illness 80 y/o female fell at home after bearing down on walker that was not locked and suffered L hip fracture.  Pt s/p L arthroplasty.  pt had pelvic and T spine fx in May.   Clinical Impression   This 80 yo female admitted and underwent above presents to acute OT with decreased balance, decreased mobility, increased pain, decreased memory for hip precautions, posterior hip precautions all affecting her ability to take care of herself at an independent level as she was PTA. She will benefit from continued OT at SNF, acute OT will sign off.    Follow Up Recommendations  SNF    Equipment Recommendations   (TBD at next venue)       Precautions / Restrictions Precautions Precautions: Fall;Posterior Hip Precaution Comments: Pt able to remember 2/3 hip precautions without cues, needed A for no bending past 90 degrees Restrictions Weight Bearing Restrictions: No LLE Weight Bearing: Weight bearing as tolerated      Mobility Bed Mobility Overal bed mobility: Needs Assistance Bed Mobility: Supine to Sit     Supine to sit: Min assist;HOB elevated        Transfers Overall transfer level: Needs assistance Equipment used: Rolling walker (2 wheeled) Transfers: Sit to/from Stand Sit to Stand: Min assist                   ADL Overall ADL's : Needs assistance/impaired Eating/Feeding: Independent;Sitting   Grooming: Wash/dry hands;Oral care;Min guard;Standing   Upper Body Bathing: Set up;Sitting   Lower Body Bathing: Moderate assistance (with min A sit<>stand)   Upper Body Dressing : Set up;Sitting   Lower Body Dressing: Maximal assistance (with Min A sit<>stand)   Toilet Transfer: Minimal assistance;Ambulation;RW;BSC (over toilet)   Toileting- Clothing Manipulation and Hygiene: Min guard (with min A  sit<>stand)                         Pertinent Vitals/Pain Pain Assessment: 0-10 Pain Score: 5  Pain Location: left hip Pain Descriptors / Indicators: Aching Pain Intervention(s): Monitored during session     Hand Dominance  right   Extremity/Trunk Assessment Upper Extremity Assessment Upper Extremity Assessment: Overall WFL for tasks assessed              Cognition Arousal/Alertness: Awake/alert Behavior During Therapy: WFL for tasks assessed/performed Overall Cognitive Status: Within Functional Limits for tasks assessed                                Home Living Family/patient expects to be discharged to:: Skilled nursing facility                                             OT Diagnosis: Generalized weakness;Acute pain   OT Problem List: Decreased strength;Decreased range of motion;Impaired balance (sitting and/or standing);Pain;Decreased knowledge of precautions;Decreased knowledge of use of DME or AE      OT Goals(Current goals can be found in the care plan section) Acute Rehab OT Goals Patient Stated Goal: to rehab then home  OT Frequency:                End of Session Equipment  Utilized During Treatment: Gait belt;Rolling walker Nurse Communication:  (no longer needs nausea meds, but would like them when she gets next pain meds; IV site is hurting her)  Activity Tolerance: Patient tolerated treatment well Patient left:  (walking with PT)   Time: 7680-8811 OT Time Calculation (min): 16 min Charges:  OT General Charges $OT Visit: 1 Procedure OT Evaluation $Initial OT Evaluation Tier I: 1 Procedure OT Treatments $Self Care/Home Management : 8-22 mins  Almon Register [phone] 10/14/2014, 9:14 AM

## 2014-10-14 NOTE — Progress Notes (Signed)
Patient Demographics  Jackie [name], is a 80 y.o. female, DOB - November 08, 1932, CWC:[id number]  Admit date - 10/11/2014   Admitting Physician Reyne Dumas, MD  Outpatient Primary MD for the patient is Sheela Stack, MD  LOS - 3   Chief Complaint  Patient presents with  . Fall      Admission history of present illness/brief narrative: 80 year old female presents after falling at home this morning. She was using her walker and the walker got stuck when going from carpet to regular floor causing her to fall. She fell on her left hip. She states she's been able to put minimal weight on it since but the pain is progressively worsened so she came in to the ER. She took 2 Tylenol without any relief. EMS gave her 150 g fentanyl with improvement of pain from a 10 to an 8. She complains of Numbness in her left ankle since the fall. Patient is developed a bruise to her left lateral thigh. Denies hitting her head or LOC.She had pelvic and T spine fractures from a fall in May and has been home since . Patient underwent left hip surgical repair 10/12/14 by Dr.Yates, she tolerated the surgery very well, she has been doing very well with physical therapy as well. Patient has been complaining of occasional dysphagia or swallowing large pills, so she evaluated by SLP, and scheduled for barium swallow study on 10/14/14. Subjective:   Jackie [name] today has, No headache, No chest pain, No abdominal pain - No Nausea, No new weakness tingling or numbness, No Cough - SOB. Left hip pain much improved, did ambulate with physical therapy today, she complains of occasional dysphagia 1 swallowing large pills.  Assessment & Plan    Active Problems:   Femoral neck fracture   Hip fracture  Hip fracture -Status post surgical repair 10/12/14 -Continue with when necessary pain and nausea  medicine -PT/OT following  History of thyroid cancer with iatrogenic hypothyroidism -TSH level is low, discussed with her endocrinologist Dr. Forde Dandy, recommendation to resume back on a lower dose, will restart patient on 112 mcg oral daily.  Dysphagia -SLP consulted. -We'll schedule for barium swallow for further evaluation today.  Anemia -Continue to monitor, will start on iron supplement.  Code Status: full  Family Communication: family at bedside  Disposition Plan: to SNF and 24 hours   Procedures  Left hip surgical repair 10/12/14   Consults   orhtopedics   Medications  Scheduled Meds: . aspirin EC  81 mg Oral Daily  . calcium-vitamin D  1 tablet Oral Q breakfast  . Chlorhexidine Gluconate Cloth  6 each Topical Daily  . cholecalciferol  1,000 Units Oral Daily  . docusate sodium  100 mg Oral BID  . enoxaparin (LOVENOX) injection  30 mg Subcutaneous Daily  . levothyroxine  112.5 mcg Oral QAC breakfast  . multivitamin with minerals  1 tablet Oral Daily  . mupirocin ointment  1 application Nasal BID  . sodium chloride  3 mL Intravenous Q12H  . sodium chloride  3 mL Intravenous Q12H   Continuous Infusions:  PRN Meds:.sodium chloride, [DISCONTINUED] acetaminophen **OR** acetaminophen (TYLENOL) oral liquid 160 mg/5 mL, acetaminophen **OR** acetaminophen, bisacodyl, HYDROcodone-acetaminophen, HYDROmorphone (DILAUDID) injection, levalbuterol, morphine injection, ondansetron **OR** ondansetron (ZOFRAN)  IV, oxyCODONE, polyethylene glycol, sodium chloride  DVT Prophylaxis    SCDs ,Lovenox.  Lab Results  Component Value Date   PLT 209 10/14/2014    Antibiotics   Anti-infectives    Start     Dose/Rate Route Frequency Ordered Stop   10/12/14 0600  ceFAZolin (ANCEF) IVPB 2 g/50 mL premix     2 g100 mL/hr over 30 Minutes Intravenous  Once 10/12/14 0044 10/12/14 1113          Objective:   Filed Vitals:   10/14/14 0000 10/14/14 0356 10/14/14 0520 10/14/14 1100    BP:   164/67 133/67  Pulse:   87 76  Temp:   99.4 F (37.4 C) 98.6 F (37 C)  TempSrc:      Resp: 16 16 16 18   Height:      Weight:      SpO2: 95% 95% 97% 98%    Wt Readings from Last 3 Encounters:  10/11/14 60.102 kg (132 lb 8 oz)  03/09/14 56.7 kg (125 lb)     Intake/Output Summary (Last 24 hours) at 10/14/14 1502 Last data filed at 10/14/14 1330  Gross per 24 hour  Intake    480 ml  Output      0 ml  Net    480 ml     Physical Exam  Awake Alert, Oriented X 3, No new F.N deficits, Normal affect [place].AT,PERRAL,mild lateral subconjunctival hemorrhage stable .Supple Neck,No JVD, No cervical lymphadenopathy appriciated.  Symmetrical Chest wall movement, Good air movement bilaterally, CTAB RRR,No Gallops,Rubs or new Murmurs, No Parasternal Heave +ve B.Sounds, Abd Soft, No tenderness, No organomegaly appriciated, No rebound - guarding or rigidity. No Cyanosis, Clubbing or edema, No new Rash or bruise  Good pulses bilaterally in lower extremities.   Data Review   Micro Results Recent Results (from the past 240 hour(s))  Urine culture     Status: None   Collection Time: 10/12/14 12:43 AM  Result Value Ref Range Status   Specimen Description URINE, RANDOM  Final   Special Requests NONE  Final   Culture  Setup Time   Final    10/12/2014 20:37 Performed at [facility]   Final    50,000 COLONIES/ML Performed at [facility]    Culture   Final    Multiple bacterial morphotypes present, none predominant. Suggest appropriate recollection if clinically indicated. Performed at [facility]    Report Status 10/14/2014 FINAL  Final  Surgical pcr screen     Status: Abnormal   Collection Time: 10/12/14  9:24 AM  Result Value Ref Range Status   MRSA, PCR NEGATIVE NEGATIVE Final   Staphylococcus aureus POSITIVE (A) NEGATIVE Final    Comment:        The Xpert SA Assay (FDA approved for NASAL specimens in patients over 21 years of  age), is one component of a comprehensive surveillance program.  Test performance has been validated by [employer] for patients greater than or equal to 23 year old. It is not intended to diagnose infection nor to guide or monitor treatment.     Radiology Reports No results found.  CBC  Recent Labs Lab 10/11/14 1559 10/11/14 1929 10/12/14 0610 10/12/14 1543 10/13/14 0506 10/14/14 0522  WBC 12.4* 9.5 7.4 9.4 6.8 6.6  HGB 12.4 11.5* 11.5* 11.0* 10.0* 8.7*  HCT 38.0 34.3* 34.7* 33.1* 29.5* 26.5*  PLT 274 250 233 184 203 209  MCV 90.3 88.9 89.0  89.0 90.2 88.0  MCH 29.5 29.8 29.5 29.6 30.6 28.9  MCHC 32.6 33.5 33.1 33.2 33.9 32.8  RDW 14.1 14.0 14.2 14.4 14.3 14.4  LYMPHSABS 0.3*  --   --   --   --   --   MONOABS 0.7  --   --   --   --   --   EOSABS 0.0  --   --   --   --   --   BASOSABS 0.0  --   --   --   --   --     Chemistries   Recent Labs Lab 10/11/14 1559 10/11/14 1929 10/12/14 0610 10/13/14 0506 10/14/14 0522  NA 132*  --  132* 135* 133*  K 3.9  --  3.9 4.4 4.3  CL 96  --  97 100 97  CO2 22  --  24 24 26   GLUCOSE 160*  --  112* 88 110*  BUN 21  --  14 13 11   CREATININE 0.69 0.65 0.65 0.73 0.67  CALCIUM 9.5  --  8.8 8.5 8.5  AST  --   --  22  --   --   ALT  --   --  13  --   --   ALKPHOS  --   --  78  --   --   BILITOT  --   --  0.8  --   --    ------------------------------------------------------------------------------------------------------------------ estimated creatinine clearance is 44.4 mL/min (by C-G formula based on Cr of 0.67). ------------------------------------------------------------------------------------------------------------------  Recent Labs  10/11/14 1929  HGBA1C 5.7*   ------------------------------------------------------------------------------------------------------------------ No results for input(s): CHOL, HDL, LDLCALC, TRIG, CHOLHDL, LDLDIRECT in the last 72  hours. ------------------------------------------------------------------------------------------------------------------  Recent Labs  10/11/14 2027  TSH 0.063*   ------------------------------------------------------------------------------------------------------------------ No results for input(s): VITAMINB12, FOLATE, FERRITIN, TIBC, IRON, RETICCTPCT in the last 72 hours.  Coagulation profile  Recent Labs Lab 10/11/14 1559 10/12/14 0610  INR 1.04 1.14    No results for input(s): DDIMER in the last 72 hours.  Cardiac Enzymes  Recent Labs Lab 10/11/14 1929 10/11/14 2027 10/12/14 0610  TROPONINI <0.30 <0.30 <0.30   ------------------------------------------------------------------------------------------------------------------ Invalid input(s): POCBNP     Time Spent in minutes  25 minutes   Ieasha Boerema M.D on 10/14/2014 at 3:02 PM  Between 7am to 7pm - Pager - [phone]  After 7pm go to [email or web address] - password [email or web address]  And look for the night coverage person covering for me after hours  [facility] Office  [phone]   **Disclaimer: This note may have been dictated with voice recognition software. Similar sounding words can inadvertently be transcribed and this note may contain transcription errors which may not have been corrected upon publication of note.**

## 2014-10-15 ENCOUNTER — Encounter: Payer: Self-pay | Admitting: Physician Assistant

## 2014-10-15 DIAGNOSIS — S72002D Fracture of unspecified part of neck of left femur, subsequent encounter for closed fracture with routine healing: Secondary | ICD-10-CM

## 2014-10-15 DIAGNOSIS — D62 Acute posthemorrhagic anemia: Secondary | ICD-10-CM

## 2014-10-15 DIAGNOSIS — E890 Postprocedural hypothyroidism: Secondary | ICD-10-CM

## 2014-10-15 DIAGNOSIS — R1314 Dysphagia, pharyngoesophageal phase: Secondary | ICD-10-CM

## 2014-10-15 LAB — BASIC METABOLIC PANEL
Anion gap: 10 (ref 5–15)
BUN: 9 mg/dL (ref 6–23)
CO2: 27 mEq/L (ref 19–32)
Calcium: 8.7 mg/dL (ref 8.4–10.5)
Chloride: 99 mEq/L (ref 96–112)
Creatinine, Ser: 0.67 mg/dL (ref 0.50–1.10)
GFR calc Af Amer: >90 mL/min (ref >90)
GFR calc non Af Amer: 81 mL/min — ABNORMAL LOW (ref >90)
Glucose, Bld: 103 mg/dL — ABNORMAL HIGH (ref 70–99)
Potassium: 4.1 mEq/L (ref 3.7–5.3)
Sodium: 136 mEq/L — ABNORMAL LOW (ref 137–147)

## 2014-10-15 LAB — CBC
HCT: 27.7 % — ABNORMAL LOW (ref 36.0–46.0)
Hemoglobin: 9.2 g/dL — ABNORMAL LOW (ref 12.0–15.0)
MCH: 30.2 pg (ref 26.0–34.0)
MCHC: 33.2 g/dL (ref 30.0–36.0)
MCV: 90.8 fL (ref 78.0–100.0)
Platelets: 257 10*3/uL (ref 150–400)
RBC: 3.05 MIL/uL — ABNORMAL LOW (ref 3.87–5.11)
RDW: 14.6 % (ref 11.5–15.5)
WBC: 5 10*3/uL (ref 4.0–10.5)

## 2014-10-15 MED ORDER — IBUPROFEN 200 MG PO TABS: 200.0000 mg | ORAL_TABLET | Freq: Four times a day (QID) | ORAL | Status: DC | PRN

## 2014-10-15 MED ORDER — POLYETHYLENE GLYCOL 3350 17 G PO PACK: 17.0000 g | PACK | Freq: Every day | ORAL | Status: DC | PRN

## 2014-10-15 MED ORDER — ACETAMINOPHEN 650 MG RE SUPP: 650.0000 mg | Freq: Four times a day (QID) | RECTAL | Status: DC | PRN

## 2014-10-15 MED ORDER — ACETAMINOPHEN 325 MG PO TABS: 650.0000 mg | ORAL_TABLET | Freq: Four times a day (QID) | ORAL | Status: DC | PRN

## 2014-10-15 MED ORDER — FERROUS SULFATE 220 (44 FE) MG/5ML PO ELIX: 220.0000 mg | ORAL_SOLUTION | Freq: Two times a day (BID) | ORAL | Status: DC

## 2014-10-15 MED ORDER — OMEPRAZOLE 20 MG PO CPDR: 20.0000 mg | DELAYED_RELEASE_CAPSULE | Freq: Every day | ORAL | Status: DC

## 2014-10-15 MED ORDER — DSS 100 MG PO CAPS: 100.0000 mg | ORAL_CAPSULE | Freq: Two times a day (BID) | ORAL | Status: DC

## 2014-10-15 MED ORDER — LEVOTHYROXINE SODIUM 75 MCG PO TABS: 112.5000 ug | ORAL_TABLET | Freq: Every day | ORAL | Status: DC

## 2014-10-15 MED ORDER — BISACODYL 10 MG RE SUPP: 10.0000 mg | Freq: Every day | RECTAL | Status: DC | PRN

## 2014-10-15 MED ORDER — ACETAMINOPHEN 325 MG PO TABS
650.0000 mg | ORAL_TABLET | Freq: Four times a day (QID) | ORAL | Status: DC | PRN
  Administered 2014-10-15: 650 mg via ORAL
  Filled 2014-10-15: qty 2

## 2014-10-15 NOTE — Plan of Care (Signed)
Problem: Discharge Progression Outcomes Goal: Barriers To Progression Addressed/Resolved Outcome: Completed/Met Date Met:  10/15/14 Goal: Discharge plan in place and appropriate Outcome: Completed/Met Date Met:  10/15/14 Goal: Pain controlled with appropriate interventions Outcome: Completed/Met Date Met:  10/15/14 Goal: Hemodynamically stable Outcome: Completed/Met Date Met:  64/38/37 Goal: Complications resolved/controlled Outcome: Completed/Met Date Met:  10/15/14 Goal: Tolerating diet Outcome: Completed/Met Date Met:  10/15/14 Goal: Activity appropriate for discharge plan Outcome: Completed/Met Date Met:  10/15/14 Goal: Tubes and drains discontinued if indicated Outcome: Not Applicable Date Met:  79/39/68 Goal: Staples/sutures removed Outcome: Not Applicable Date Met:  86/48/47 Goal: Steri-Strips applied Outcome: Not Applicable Date Met:  20/72/18 Goal: Other Discharge Outcomes/Goals Outcome: Not Applicable Date Met:  28/83/37

## 2014-10-15 NOTE — Plan of Care (Signed)
Problem: Phase II Progression Outcomes Goal: Sutures/staples intact Outcome: Completed/Met Date Met:  10/15/14 Goal: Return of bowel function (flatus, BM) IF ABDOMINAL SURGERY:  Outcome: Completed/Met Date Met:  10/15/14 Goal: Other Phase II Outcomes/Goals Outcome: Not Applicable Date Met:  62/82/41  Problem: Phase III Progression Outcomes Goal: Discharge plan remains appropriate-arrangements made Outcome: Completed/Met Date Met:  10/15/14 Goal: Demonstrates TCDB, IS independently Outcome: Completed/Met Date Met:  10/15/14 Goal: Other Phase III Outcomes/Goals Outcome: Not Applicable Date Met:  75/30/10

## 2014-10-15 NOTE — Clinical Social Work Note (Addendum)
Pt to be discharged to [facility]. Pt and pt's daughter updated at bedside.  [facility]: [phone] Transportation: EMS [phone]) scheduled for evening (5pm).  Lubertha Sayres, [place] [phone]) Licensed Clinical Social Worker Orthopedics [phone]) and Surgical [phone])

## 2014-10-15 NOTE — Progress Notes (Signed)
Subjective: 3 Days Post-Op monopolar arthroplasty Patient reports pain as mild.  Wants to be more active, but reports nausea and vomiting inhibiting her from it. Cant eat this am. No BM.   Objective: Vital signs in last 24 hours: Temp:  [98.2 F (36.8 C)-98.6 F (37 C)] 98.2 F (36.8 C) (11/18 0600) Pulse Rate:  [71-76] 71 (11/18 0600) Resp:  [16-18] 16 (11/18 0600) BP: (133-134)/(62-67) 134/62 mmHg (11/18 0600) SpO2:  [93 %-98 %] 93 % (11/18 0600)  Intake/Output from previous day: 11/17 0701 - 11/18 0700 In: 1420 [P.O.:1420] Out: 1 [Urine:1] Intake/Output this shift:     Recent Labs  10/12/14 1543 10/13/14 0506 10/14/14 0522 10/15/14 0620  HGB 11.0* 10.0* 8.7* 9.2*    Recent Labs  10/14/14 0522 10/15/14 0620  WBC 6.6 5.0  RBC 3.01* 3.05*  HCT 26.5* 27.7*  PLT 209 257    Recent Labs  10/14/14 0522 10/15/14 0620  NA 133* 136*  K 4.3 4.1  CL 97 99  CO2 26 27  BUN 11 9  CREATININE 0.67 0.67  GLUCOSE 110* 103*  CALCIUM 8.5 8.7   No results for input(s): LABPT, INR in the last 72 hours.  Neurovascular intact Incision: moderate drainage  Assessment/Plan: 3 Days Post-Op monopolar arthroplasty Up with therapy Discharge to SNF when medically stable Wound drainage is slowing. Needs daily drsg change until wound drainage stops.  Ice packs to wound.  VERNON,SHEILA M 10/15/2014, 8:44 AM

## 2014-10-15 NOTE — Plan of Care (Signed)
Problem: Phase II Progression Outcomes Goal: Pain controlled Outcome: Completed/Met Date Met:  10/15/14 Goal: Progress activity as tolerated unless otherwise ordered Outcome: Completed/Met Date Met:  10/15/14 Goal: Progressing with IS, TCDB Outcome: Completed/Met Date Met:  10/15/14 Goal: Vital signs stable Outcome: Completed/Met Date Met:  10/15/14 Goal: Surgical site without signs of infection Outcome: Completed/Met Date Met:  10/15/14 Goal: Dressings dry/intact Outcome: Completed/Met Date Met:  10/15/14 Goal: Sutures/staples intact Outcome: Not Met (add Reason) UTA Goal: Foley discontinued Outcome: Not Applicable Date Met:  94/17/40 Goal: Discharge plan established Outcome: Completed/Met Date Met:  10/15/14 Goal: Tolerating diet Outcome: Completed/Met Date Met:  10/15/14  Problem: Phase III Progression Outcomes Goal: Pain controlled on oral analgesia Outcome: Completed/Met Date Met:  10/15/14 Goal: Voiding independently Outcome: Completed/Met Date Met:  10/15/14 Goal: IV changed to normal saline lock Outcome: Completed/Met Date Met:  10/15/14 Goal: Nasogastric tube discontinued Outcome: Not Applicable Date Met:  81/44/81

## 2014-10-15 NOTE — Discharge Summary (Addendum)
Physician Discharge Summary  Jackie [name] JOA:[id number] DOB: Apr 11, 1932 DOA: 10/11/2014  PCP: Sheela Stack, MD  Admit date: 10/11/2014 Discharge date: 10/15/2014  Time spent: greater than 30 minutes  Recommendations for Outpatient Follow-up:  1. Dr. Rodell Perna, Orthopedics in 2 weeks. 2. PCP or MD at SNF in 5 days with repeat labs (CBC & BMP). 3. Recommend repeating TSH in 4-6 weeks. 4. Ms. Cecille Rubin Hvozdovic, [facility] on 10/28/2014 at 9:15 AM to evaluate swallowing issues.  Discharge Diagnoses:  Active Problems:   Femoral neck fracture   Hip fracture   Discharge Condition: Improved & Stable  Diet recommendation: dysphagia 3 diet and thin liquids.  Filed Weights   10/11/14 1552 10/11/14 2025  Weight: 56.7 kg (125 lb) 60.102 kg (132 lb 8 oz)    History of present illness:  80 year old female with history of thyroid cancer,hypothyroid post thyroidectomy,ambulates with the help of walker at home, presented with history of falling at home when her walker got stuck when going from carpet to regular floor. She landed on her left hip and noticed progressively worsening pain. Workup in the ED revealed left hip fracture.  Hospital Course:   1. Left hip fracture (displaced left femoral neck fracture), s/p hemiarthroplasty 10/12/14: Orthopedics was consulted and patient underwent left monopolar press-fit hemiarthroplasty on 10/12/14. Patient states that her pain has progressively improved. Discussed with Dr. Lorin Mercy who recommends-aspirin 325 MG daily for 4 weeks for postop DVT prophylaxis (following which she can revert to prior dose of 81 MG daily), full weightbearing on left lower extremity as tolerated, daily dressing change to surgical site while there is mild drainage and then as needed and follow-up with him in 2 weeks. Dr. Lorin Mercy has cleared her for discharge. Patient states that opioid medications cause her to feel depressed/down and requests that these medications be completely  discontinued and prefers to use acetaminophen and ibuprofen as needed for pain. 2. History of thyroid cancer, status post thyroidectomy, hypothyroidism: TSH suppressed at 0.063. Dr. Phillips Climes discussed with patient's PCP/endocrinologist recommended resuming Synthroid at lower than home dose. Recommend repeating TSH in 4-6 weeks and outpatient follow-up with her PCP. 3. Dysphagia/cervical web/Zenker's diverticulum: Patient had expressed complains of occasional difficulty swallowing large pills. Barium swallow was performed with findings as outlined below. Discussed with [facility] team on call who recommended outpatient follow-up and have arranged an appointment. They also recommended modified diet-dysphagia 3 diet and thin liquids. Speech therapy had seen him and recommended regular diet and thin liquids but this was prior to the barium swallow. This seems to be a chronic and nonacute problem which can be addressed outpatient. Will add low dose PPI while on full ASA. 4. Acute post hemorrhagic anemia: Secondary to surgery. Hemoglobin stable over the last 48 hours. Iron supplements started. Follow CBCs as outpatient.  Consultations:  Orthopedics  Speech therapy  Procedures:  On 10/12/14:Left monopolar press-fit hemiarthroplasty [place] Basic #4 stem +0 neck, 44 mm ball.    Discharge Exam:  Complaints:  appropriate for left hip pain which is progressively decreasing. States that narcotic pain medications cause her to feel depressed and down and requests that they be stopped. Denies nausea or vomiting. States that she ate a small piece of toast and drank some juice as this morning at breakfast and did not like her eggs.  Filed Vitals:   10/14/14 0520 10/14/14 1100 10/14/14 2103 10/15/14 0600  BP: 164/67 133/67 134/63 134/62  Pulse: 87 76 73 71  Temp: 99.4 F (37.4 C)  98.6 F (37 C) 98.5 F (36.9 C) 98.2 F (36.8 C)  TempSrc:      Resp: 16 18 16 16   Height:      Weight:      SpO2:  97% 98% 97% 93%    General exam: pleasant elderly female lying comfortably supine in bed. Appears slightly anxious but in no obvious distress. Respiratory system: Clear. No increased work of breathing. Cardiovascular system: S1 & S2 heard, RRR. No JVD, murmurs, gallops, clicks or pedal edema. Telemetry: Sinus rhythm. Gastrointestinal system: Abdomen is nondistended, soft and nontender. Normal bowel sounds heard. Central nervous system: Alert and oriented. No focal neurological deficits. Extremities: Symmetric 5 x 5 power. Left hip surgical site dressing, clean and dry.  Discharge Instructions      Discharge Instructions    Call MD for:  redness, tenderness, or signs of infection (pain, swelling, redness, odor or green/yellow discharge around incision site)    Complete by:  As directed      Call MD for:  severe uncontrolled pain    Complete by:  As directed      Call MD for:  temperature >100.4    Complete by:  As directed      Discharge instructions    Complete by:  As directed   DIET: Dysphagia 3 diet and thin liquids.     Discharge wound care:    Complete by:  As directed   Daily dry dressing change to left hip postop site until no drainage then change dressing as needed.     Full weight bearing    Complete by:  As directed   Laterality:  left  Extremity:  Lower     Increase activity slowly    Complete by:  As directed      Posterior total hip precautions    Complete by:  As directed             Medication List    STOP taking these medications        aspirin 81 MG tablet  Replaced by:  aspirin EC 325 MG tablet      TAKE these medications        acetaminophen 325 MG tablet  Commonly known as:  TYLENOL  Take 2 tablets (650 mg total) by mouth every 6 (six) hours as needed for mild pain, moderate pain, fever or headache.     aspirin EC 325 MG tablet  Take 1 tablet (325 mg total) by mouth daily.     bisacodyl 10 MG suppository  Commonly known as:  DULCOLAX   Place 1 suppository (10 mg total) rectally daily as needed for moderate constipation.     CALCIUM & VIT D3 BONE HEALTH PO  Take 1 tablet by mouth daily.     DSS 100 MG Caps  Take 100 mg by mouth 2 (two) times daily.     ferrous sulfate 220 (44 FE) MG/5ML solution  Take 5 mLs (220 mg total) by mouth 2 (two) times daily with a meal.     ibuprofen 200 MG tablet  Commonly known as:  ADVIL,MOTRIN  Take 1 tablet (200 mg total) by mouth every 6 (six) hours as needed for moderate pain (for pain not controlled with tylenol.).     levothyroxine 75 MCG tablet  Commonly known as:  SYNTHROID, LEVOTHROID  Take 1.5 tablets (112.5 mcg total) by mouth daily before breakfast.     multivitamin with minerals tablet  Take 1 tablet by mouth daily.  omeprazole 20 MG capsule  Commonly known as:  PRILOSEC  Take 1 capsule (20 mg total) by mouth daily.     polyethylene glycol packet  Commonly known as:  MIRALAX / GLYCOLAX  Take 17 g by mouth daily as needed for mild constipation.     VITAMIN D-3 PO  Take 1 tablet by mouth daily.       Follow-up Information    Follow up with Marybelle Killings, MD. Schedule an appointment as soon as possible for a visit in 2 weeks.   Specialty:  Orthopedic Surgery   Contact information:   [place] [address] [phone]       Follow up with Hvozdovic, Vita Barley, PA-C.   Specialty:  Physician Assistant   Why:  [place] office appt at 9:15 to discuss swallowing issues.  bring all your meds and insurance documents to this visit please.    Contact information:   [address] [place] [id number] [phone]       Follow up On 10/28/2014.       The results of significant diagnostics from this hospitalization (including imaging, microbiology, ancillary and laboratory) are listed below for reference.    Significant Diagnostic Studies: Dg Hip Complete Left  10/11/2014   CLINICAL DATA:  Walking to the bathroom and fell when the wheel of  her walker got caught on the carpet. Pain in the left hip.  EXAM: LEFT HIP - COMPLETE 2+ VIEW  COMPARISON:  06/28/2009.  CT pelvis 04/16/2014  FINDINGS: There is an acute femoral neck fracture on the left, displaced almost 2 cm. No intertrochanteric component. There is sclerosis of the sacrum related to healing sacral fractures. There is widening and fragmentation of the symphysis pubis region related to subacute symphyseal region fractures with apparent nonunion and fragmentation.  IMPRESSION: Acute left femoral neck fracture displaced 2 cm.  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  Healing sacral fractures with sclerotic change.   Electronically Signed   By: Nelson Chimes M.D.   On: 10/11/2014 17:19   Dg Esophagus  10/14/2014   CLINICAL DATA:  Pill dysphagia  EXAM: ESOPHOGRAM/BARIUM SWALLOW  TECHNIQUE: Single contrast examination was performed using  thin barium.  FLUOROSCOPY TIME:  2 min 7 seconds  COMPARISON:  None.  FINDINGS: There is a web within the cervical esophagus through which the pill was initially unable to pass. A small Zenker's diverticulum was identified.  The thoracic portions of the esophagus have a normal caliber without evidence for stricture or mass. The motility of the thoracic esophagus is unremarkable. No reflux identified.  IMPRESSION: 1. 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 2. Zenker's diverticulum. 3. Patent appearance of the thoracic esophagus.   Electronically Signed   By: Kerby Moors M.D.   On: 10/14/2014 17:48    Microbiology: Recent Results (from the past 240 hour(s))  Urine culture     Status: None   Collection Time: 10/12/14 12:43 AM  Result Value Ref Range Status   Specimen Description URINE, RANDOM  Final   Special Requests NONE  Final   Culture  Setup Time   Final    10/12/2014 20:37 Performed at [facility]   Final    50,000 COLONIES/ML Performed at [facility]    Culture   Final    Multiple bacterial morphotypes present, none predominant. Suggest appropriate recollection if clinically indicated. Performed at [facility]    Report Status 10/14/2014 FINAL  Final  Surgical pcr screen     Status: Abnormal   Collection Time: 10/12/14  9:24 AM  Result Value Ref Range Status   MRSA, PCR NEGATIVE NEGATIVE Final   Staphylococcus aureus POSITIVE (A) NEGATIVE Final    Comment:        The Xpert SA Assay (FDA approved for NASAL specimens in patients over 62 years of age), is one component of a comprehensive surveillance program.  Test performance has been validated by [employer] for patients greater than or equal to 18 year old. It is not intended to diagnose infection nor to guide or monitor treatment.      Labs: Basic Metabolic Panel:  Recent Labs Lab 10/11/14 1559 10/11/14 1929 10/12/14 0610 10/13/14 0506 10/14/14 0522 10/15/14 0620  NA 132*  --  132* 135* 133* 136*  K 3.9  --  3.9 4.4 4.3 4.1  CL 96  --  97 100 97 99  CO2 22  --  24 24 26 27   GLUCOSE 160*  --  112* 88 110* 103*  BUN 21  --  14 13 11 9   CREATININE 0.69 0.65 0.65 0.73 0.67 0.67  CALCIUM 9.5  --  8.8 8.5 8.5 8.7   Liver Function Tests:  Recent Labs Lab 10/12/14 0610  AST 22  ALT 13  ALKPHOS 78  BILITOT 0.8  PROT 6.4  ALBUMIN 3.2*   No results for input(s): LIPASE, AMYLASE in the last 168 hours. No results for input(s): AMMONIA in the last 168 hours. CBC:  Recent Labs Lab 10/11/14 1559  10/12/14 0610 10/12/14 1543 10/13/14 0506 10/14/14 0522 10/15/14 0620  WBC 12.4*  < > 7.4 9.4 6.8 6.6 5.0  NEUTROABS 11.4*  --   --   --   --   --   --   HGB 12.4  < > 11.5* 11.0* 10.0* 8.7* 9.2*  HCT 38.0  < > 34.7* 33.1* 29.5* 26.5* 27.7*  MCV 90.3  < > 89.0 89.0 90.2 88.0 90.8  PLT 274  < > 233 184 203 209 257  < > = values in this interval not displayed. Cardiac Enzymes:  Recent Labs Lab 10/11/14 1929 10/11/14 2027 10/12/14 0610   TROPONINI <0.30 <0.30 <0.30   BNP: BNP (last 3 results) No results for input(s): PROBNP in the last 8760 hours. CBG: No results for input(s): GLUCAP in the last 168 hours.   Additional labs: 1. TSH: 0.063 2. 2-D echo 10/12/14: Study Conclusions  - Left ventricle: The cavity size was normal. Systolic function was normal. The estimated ejection fraction was in the range of 60% to 65%. Wall motion was normal; there were no regional wall motion abnormalities. There was an increased relative contribution of atrial contraction to ventricular filling. Doppler parameters are consistent with abnormal left ventricular relaxation (grade 1 diastolic dysfunction). - Aortic valve: Poorly visualized. There was trivial regurgitation. - Mitral valve: Calcified annulus. - Right atrium: The atrium was moderately dilated. - Atrial septum: There was increased thickness of the septum, consistent with lipomatous hypertrophy. - Tricuspid valve: There was moderate regurgitation. - Pulmonic valve: There was trivial regurgitation. - Pulmonary arteries: PA peak pressure: 38 mm Hg (S).  Impressions:  - The right ventricular systolic pressure was increased consistent with mild pulmonary hypertension.  Discussed extensively with daughter Ms. Gordy Savers at bedside, updated care and answered all questions. She is agreeable to plan of care.  SignedVernell Leep, MD, Rosalita Chessman,  FHM. [facility] Pager [phone]  If 7PM-7AM, please contact night-coverage [email or web address] Password [email or web address] 10/15/2014, 12:41 PM

## 2014-10-15 NOTE — Plan of Care (Signed)
Problem: Phase III Progression Outcomes Goal: Activity at appropriate level-compared to baseline (UP IN CHAIR FOR HEMODIALYSIS)  Outcome: Completed/Met Date Met:  10/15/14

## 2014-10-15 NOTE — Progress Notes (Signed)
Physical Therapy Treatment Patient Details Name: Jackie [name] MRN: [id number] DOB: 1932/09/14 Today's Date: 10/15/2014    History of Present Illness 80 y/o female fell at home after bearing down on walker that was not locked and suffered L hip fracture.  Pt s/p L arthroplasty.  pt had pelvic and T spine fx in May.    PT Comments    Pt with increased pain today, but continues to make good progress. She cannot recall hip precautions without cueing. Continue to recommend SNF for rehab after acute care d/c.    Follow Up Recommendations  SNF     Equipment Recommendations  None recommended by PT    Recommendations for Other Services       Precautions / Restrictions Precautions Precautions: Fall;Posterior Hip Precaution Comments: Reviewed precautions and recalled 1/3 without cueing. Restrictions LLE Weight Bearing: Weight bearing as tolerated    Mobility  Bed Mobility Overal bed mobility: Needs Assistance Bed Mobility: Supine to Sit     Supine to sit: Min assist;HOB elevated        Transfers Overall transfer level: Needs assistance Equipment used: Rolling walker (2 wheeled) Transfers: Sit to/from Stand Sit to Stand: Min assist         General transfer comment: Cues for hand placement and to follow hip precautions with transitional movements.  Ambulation/Gait Ambulation/Gait assistance: Min guard Ambulation Distance (Feet): 100 Feet Assistive device: Rolling walker (2 wheeled) Gait Pattern/deviations: Step-through pattern Gait velocity: decreased   General Gait Details: Decreased speed with gait today due to increased pain.  Good technique and increased step through pattern.   Stairs            Wheelchair Mobility    Modified Rankin (Stroke Patients Only)       Balance     Sitting balance-Leahy Scale: Good     Standing balance support: During functional activity Standing balance-Leahy Scale: Poor                      Cognition  Arousal/Alertness: Awake/alert Behavior During Therapy: WFL for tasks assessed/performed Overall Cognitive Status: Within Functional Limits for tasks assessed                      Exercises Total Joint Exercises Ankle Circles/Pumps: AROM;Both;Seated Gluteal Sets: 10 reps;Strengthening Hip ABduction/ADduction: AROM;Left;10 reps;Seated Long Arc Quad: Strengthening;Both;10 reps;Seated    General Comments        Pertinent Vitals/Pain Pain Assessment: 0-10 Pain Score: 8  Pain Location: L hip Pain Descriptors / Indicators: Aching Pain Intervention(s): Patient requesting pain meds-RN notified;Repositioned    Home Living                      Prior Function            PT Goals (current goals can now be found in the care plan section) Acute Rehab PT Goals Patient Stated Goal: to rehab then home PT Goal Formulation: With patient/family Time For Goal Achievement: 10/20/14 Potential to Achieve Goals: Good Progress towards PT goals: Progressing toward goals    Frequency  Min 5X/week    PT Plan Current plan remains appropriate    Co-evaluation             End of Session   Activity Tolerance: Patient tolerated treatment well;Patient limited by pain Patient left: in chair;with call bell/phone within reach;with nursing/sitter in room;with family/visitor present     Time: 1517-6160 PT Time Calculation (min) (  ACUTE ONLY): 27 min  Charges:  $Gait Training: 8-22 mins $Therapeutic Exercise: 8-22 mins                    G Codes:      Jackie [name] 10/15/2014, 9:31 AM

## 2014-10-16 ENCOUNTER — Encounter: Payer: Self-pay | Admitting: Registered Nurse

## 2014-10-16 ENCOUNTER — Non-Acute Institutional Stay (SKILLED_NURSING_FACILITY): Payer: Medicare Other | Admitting: Registered Nurse

## 2014-10-16 DIAGNOSIS — R0989 Other specified symptoms and signs involving the circulatory and respiratory systems: Secondary | ICD-10-CM

## 2014-10-16 DIAGNOSIS — K5900 Constipation, unspecified: Secondary | ICD-10-CM

## 2014-10-16 DIAGNOSIS — H1131 Conjunctival hemorrhage, right eye: Secondary | ICD-10-CM

## 2014-10-16 DIAGNOSIS — E890 Postprocedural hypothyroidism: Secondary | ICD-10-CM

## 2014-10-16 DIAGNOSIS — R1310 Dysphagia, unspecified: Secondary | ICD-10-CM

## 2014-10-16 DIAGNOSIS — M6281 Muscle weakness (generalized): Secondary | ICD-10-CM

## 2014-10-16 DIAGNOSIS — K219 Gastro-esophageal reflux disease without esophagitis: Secondary | ICD-10-CM

## 2014-10-16 DIAGNOSIS — S72002D Fracture of unspecified part of neck of left femur, subsequent encounter for closed fracture with routine healing: Secondary | ICD-10-CM

## 2014-10-16 NOTE — Progress Notes (Signed)
Patient ID: Jackie [name], female   DOB: 1932-02-16, 80 y.o.   MRN: [id number]   Place of Service: [facility]  Allergies  Allergen Reactions  . Codeine Camsylate [Codeine] Other (See Comments)    Caused severe headaches, dizziness and confusion  . Zithromax [Azithromycin] Nausea And Vomiting    Code Status: Full Code  Goals of Care: Longevity/STR  Chief Complaint  Patient presents with  . Hospitalization Follow-up    HPI 80 y.o. female with PMH of thyroid cancer s/p hypothyroidism post thyroidectomy, GERD, and constipation is being seen for a post hospital follow-up. She is here for STR post hospital admission from 10/11/14 to 10/15/14 for left hip fx s/p left hemiarthroplasty after a fall at home when her walker got stuck when going from carpet to regular floor. Sitting in comfortably in chair. Family at bedside.   Review of Systems Constitutional: Negative for fever and chills, and fatigue. HENT: Negative for congestion, and sore throat Eyes: Negative for eye pain and visual disturbance. Positive for redness Cardiovascular: Negative for chest pain and palpitations.  Respiratory: Negative shortness of breath, and wheezing. Positive for cough Gastrointestinal: Negative for nausea and vomiting. Negative for abdominal pain, diarrhea and constipation.  Genitourinary: Negative for  dysuria Musculoskeletal: Positive for left leg pain  Neurological: Negative for dizziness and headache.   Skin: Negative for rash  Psychiatric: Negative for nervous/anxious, agitation, and depression.   Past Medical History  Diagnosis Date  . Thyroid disease   . Cancer     Past Surgical History  Procedure Laterality Date  . Total thyroidectomy    . Hip arthroplasty Left 10/12/2014    Procedure: ARTHROPLASTY BIPOLAR HIP;  Surgeon: Marybelle Killings, MD;  Location: [place];  Service: Orthopedics;  Laterality: Left;      Medication List       This list is accurate as of: 10/16/14  1:55  PM.  Always use your most recent med list.               acetaminophen 325 MG tablet  Commonly known as:  TYLENOL  Take 2 tablets (650 mg total) by mouth every 6 (six) hours as needed for mild pain, moderate pain, fever or headache.     aspirin EC 325 MG tablet  Take 1 tablet (325 mg total) by mouth daily.     bisacodyl 10 MG suppository  Commonly known as:  DULCOLAX  Place 1 suppository (10 mg total) rectally daily as needed for moderate constipation.     CALCIUM & VIT D3 BONE HEALTH PO  Take 1 tablet by mouth daily.     DSS 100 MG Caps  Take 100 mg by mouth 2 (two) times daily.     ferrous sulfate 220 (44 FE) MG/5ML solution  Take 5 mLs (220 mg total) by mouth 2 (two) times daily with a meal.     ibuprofen 200 MG tablet  Commonly known as:  ADVIL,MOTRIN  Take 1 tablet (200 mg total) by mouth every 6 (six) hours as needed for moderate pain (for pain not controlled with tylenol.).     levothyroxine 75 MCG tablet  Commonly known as:  SYNTHROID, LEVOTHROID  Take 1.5 tablets (112.5 mcg total) by mouth daily before breakfast.     multivitamin with minerals tablet  Take 1 tablet by mouth daily.     omeprazole 20 MG capsule  Commonly known as:  PRILOSEC  Take 1 capsule (20 mg total) by mouth daily.  polyethylene glycol packet  Commonly known as:  MIRALAX / GLYCOLAX  Take 17 g by mouth daily as needed for mild constipation.     VITAMIN D-3 PO  Take 1 tablet by mouth daily.        Physical Exam Filed Vitals:   10/16/14 1347  BP: 140/70  Pulse: 80  Temp: 98.6 F (37 C)  Resp: 20   Constitutional: WDWN elderly female in no acute distress. Very pleasant and conversant.   HEENT: Normocephalic and atraumatic. PERRL. EOM intact. No icterus. Right lateral conjunctival hemorrhage noted. Wears eyeglasses. No nasal discharge or sinus tenderness. Oral mucosa moist. Posterior pharynx clear of any exudate or lesions. Teeth and gingiva in good general condition.  Neck:  Supple and nontender. No lymphadenopathy, masses, or thyromegaly. No JVD or carotid bruits. Cardiac: Normal S1, S2. RRR without appreciable murmurs, rubs, or gallops. Distal pulses intact. Trace pitting edema of BLE  Lungs: No respiratory distress. Breath sounds coarse bilaterally without rales, rhonchi, or wheezes. Abdomen: Audible bowel sounds in all quadrants. Soft, nontender, nondistended.  Musculoskeletal: Normal range of motion. No joint erythema or tenderness. Spine and Back:  No tenderness over spines. No CVA tenderness.  Skin: Warm and dry. No rash noted. No erythema.  Neurological: Alert and oriented to person, place, and time. No focal deficits.  Psychiatric: Judgment and insight adequate. Appropriate mood and affect.   Labs Reviewed CBC Latest Ref Rng 10/15/2014 10/14/2014 10/13/2014  WBC 4.0 - 10.5 K/uL 5.0 6.6 6.8  Hemoglobin 12.0 - 15.0 g/dL 9.2(L) 8.7(L) 10.0(L)  Hematocrit 36.0 - 46.0 % 27.7(L) 26.5(L) 29.5(L)  Platelets 150 - 400 K/uL 257 209 203    CMP     Component Value Date/Time   NA 136* 10/15/2014 0620   K 4.1 10/15/2014 0620   CL 99 10/15/2014 0620   CO2 27 10/15/2014 0620   GLUCOSE 103* 10/15/2014 0620   BUN 9 10/15/2014 0620   CREATININE 0.67 10/15/2014 0620   CALCIUM 8.7 10/15/2014 0620   PROT 6.4 10/12/2014 0610   ALBUMIN 3.2* 10/12/2014 0610   AST 22 10/12/2014 0610   ALT 13 10/12/2014 0610   ALKPHOS 78 10/12/2014 0610   BILITOT 0.8 10/12/2014 0610   GFRNONAA 81* 10/15/2014 0620   GFRAA >90 10/15/2014 0620    Lab Results  Component Value Date   TSH 0.063* 10/11/2014    Diagnostic Studies Reviewed 10/11/14-DG Hip Complete Left Impression: acute left femoral fractures displaced 2cm. 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 Healing sacral fx with sclerotic changes  10/14/14-DG esophagus (ESOPHOGRAM/BARIUM SWALLOW) IMPRESSION: 1. 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 2.  Zenker's diverticulum. 3. Patent appearance of the thoracic esophagus.  10/12/14: 2-D Echo -Left ventricle: The cavity size was normal. Systolic function wasnormal. The estimated ejection fraction was in the range of 60% to 65%. Wall motion was normal; there were no regional wall motion abnormalities. There was an increased relative contribution of atrial contraction to ventricular filling. Doppler parameters are consistent with abnormal left ventricular relaxation (grade 1 diastolic dysfunction). - Aortic valve: Poorly visualized. There was trivial regurgitation. - Mitral valve: Calcified annulus. - Right atrium: The atrium was moderately dilated. - Atrial septum: There was increased thickness of the septum, consistent with lipomatous hypertrophy. - Tricuspid valve: There was moderate regurgitation. - Pulmonic valve: There was trivial regurgitation. - Pulmonary arteries: PA peak pressure: 38 mm Hg (S).  Impressions: The right ventricular systolic pressure was increased consistent with mild  pulmonary hypertension.  Assessment & Plan 1. Gastroesophageal reflux disease without esophagitis Stable. Continue omeprazole 20mg  daily and monitor.   2. Muscle weakness (generalized) Continue to work with PT/OT for gait/balance/strength training and ADLs. Continue fall risk precautions and monitor.   3. Postoperative hypothyroidism S/p thyroidectomy in the setting of thyroid cancer. Last tsh level 0.063. Will recheck thyroid panel in 4 weeks and adjust dosage accordingly.  Continue levothyroxine 141mcg daily for now and monitor  4. Constipation, unspecified constipation type Continue colace 100mg  twice daily, miralax 17g daily prn, and dulcolax 10mg  PR daily prn. Continue to monitor.   5. Dysphagia Chronic. Continued modified diet dysphagia 3 diet and thin liquids. Continue aspiration precautions. Has f/u appt with GI on 10/28/14 at 9:15 with Lori Hvozdovic to discuss swallowing issues.  Patient would like to have soft foods instead of pureed diet. Will have RD see patient. I don't think that should be an issue based on her barium swallow study.   6. Left displaced femoral neck fracture, closed, with routine healing, subsequent encounter Stable. S/p left monopolar press-fit hemiarthroplasty. Continue to work with PT/OT for gait/balance/strength training and ADLs. Continue tylenol 650mg  Q6H PRN and ibuprfen 200mg  Q6H PRN for pain. Continue DVT prophylaxis with asa 325mg  daily x 4 weeks. Continue full weight bearing as tolerated and daily dressing change daily prn. F/u with Dr. Lorin Mercy in about 2 weeks.   7. Right conjunctival hemorrhage  Asymptomatic. Reassure patient that this will resolve with time. Continue to monitor  8. Chest congestion Mucinex 600mg  BID x 5 days then BID PRN. Encourage increased fluid intake and using incentive spirometer. Continue to monitor   Labs Ordered: TSH in 4 weeks.  Family/Staff Communication Plan of care discuss with patient and nursing staff. Patient and nursing staff verbalize understanding and agree with plan of care. No additional questions or concerns reported.    Arthur Holms, MSN, AGNP-C [facility] [address] [place], [place] [address] [phone] [8am-5pm] After hours: [phone]

## 2014-10-17 ENCOUNTER — Non-Acute Institutional Stay (SKILLED_NURSING_FACILITY): Payer: Medicare Other | Admitting: Internal Medicine

## 2014-10-17 DIAGNOSIS — D509 Iron deficiency anemia, unspecified: Secondary | ICD-10-CM

## 2014-10-17 DIAGNOSIS — E039 Hypothyroidism, unspecified: Secondary | ICD-10-CM

## 2014-10-17 DIAGNOSIS — K5900 Constipation, unspecified: Secondary | ICD-10-CM

## 2014-10-17 DIAGNOSIS — K219 Gastro-esophageal reflux disease without esophagitis: Secondary | ICD-10-CM

## 2014-10-17 DIAGNOSIS — M62838 Other muscle spasm: Secondary | ICD-10-CM

## 2014-10-17 DIAGNOSIS — R1310 Dysphagia, unspecified: Secondary | ICD-10-CM

## 2014-10-17 DIAGNOSIS — S72002S Fracture of unspecified part of neck of left femur, sequela: Secondary | ICD-10-CM

## 2014-10-17 NOTE — Progress Notes (Signed)
Patient ID: Jackie [name], female   DOB: 1932/03/16, 80 y.o.   MRN: [id number]       PCP: Sheela Stack, MD  Code Status: full code  Allergies  Allergen Reactions  . Codeine Camsylate [Codeine] Other (See Comments)    Caused severe headaches, dizziness and confusion  . Zithromax [Azithromycin] Nausea And Vomiting    Chief Complaint  Patient presents with  . New Admit To SNF     HPI:  80 y/o pt is here for STR post hospital admission from 10/11/14 to 10/15/14 with left hip fracture s/p fall. She underwent left hemiarthroplasty.     She has PMH of thyroid cancer s/p thyroidectomy, GERD, constipation She is seen in her room today. She would like her diet upgraded from pureed to regular. Her pain is under control with current regimen. She has muscle spasms and constipation. She was taking miralax at home and was helpful. Last bowel movement this am  Review of Systems:  Constitutional: Negative for fever, chills HENT: Negative for congestion Respiratory: Negative for cough, sputum production, shortness of breath and wheezing.   Cardiovascular: Negative for chest pain, palpitations, orthopnea and leg swelling.  Gastrointestinal: Negative for heartburn, nausea, vomiting, abdominal pain Genitourinary: Negative for dysuria, urgency and flank pain.  Musculoskeletal: Negative for back pain, falls, joint pain  Skin: Negative for itching, rash.  Neurological: Negative for weakness,dizziness, focal weakness and headaches.  Psychiatric/Behavioral: Negative for depression  Past Medical History  Diagnosis Date  . Thyroid disease   . Cancer    Past Surgical History  Procedure Laterality Date  . Total thyroidectomy    . Hip arthroplasty Left 10/12/2014    Procedure: ARTHROPLASTY BIPOLAR HIP;  Surgeon: Marybelle Killings, MD;  Location: [place];  Service: Orthopedics;  Laterality: Left;   Social History:   reports that she has never smoked. She does not have any smokeless tobacco history  on file. She reports that she does not drink alcohol or use illicit drugs.  No family history on file.  Medications: Patient's Medications  New Prescriptions   No medications on file  Previous Medications   ACETAMINOPHEN (TYLENOL) 325 MG TABLET    Take 2 tablets (650 mg total) by mouth every 6 (six) hours as needed for mild pain, moderate pain, fever or headache.   ASPIRIN EC 325 MG TABLET    Take 1 tablet (325 mg total) by mouth daily.   BISACODYL (DULCOLAX) 10 MG SUPPOSITORY    Place 1 suppository (10 mg total) rectally daily as needed for moderate constipation.   CHOLECALCIFEROL (VITAMIN D-3 PO)    Take 1 tablet by mouth daily.   DOCUSATE SODIUM 100 MG CAPS    Take 100 mg by mouth 2 (two) times daily.   FERROUS SULFATE 220 (44 FE) MG/5ML SOLUTION    Take 5 mLs (220 mg total) by mouth 2 (two) times daily with a meal.   IBUPROFEN (ADVIL,MOTRIN) 200 MG TABLET    Take 1 tablet (200 mg total) by mouth every 6 (six) hours as needed for moderate pain (for pain not controlled with tylenol.).   LEVOTHYROXINE (SYNTHROID, LEVOTHROID) 75 MCG TABLET    Take 1.5 tablets (112.5 mcg total) by mouth daily before breakfast.   MULTIPLE MINERALS-VITAMINS (CALCIUM & VIT D3 BONE HEALTH PO)    Take 1 tablet by mouth daily.   MULTIPLE VITAMINS-MINERALS (MULTIVITAMIN WITH MINERALS) TABLET    Take 1 tablet by mouth daily.   OMEPRAZOLE (PRILOSEC) 20 MG CAPSULE  Take 1 capsule (20 mg total) by mouth daily.   POLYETHYLENE GLYCOL (MIRALAX / GLYCOLAX) PACKET    Take 17 g by mouth daily as needed for mild constipation.  Modified Medications   No medications on file  Discontinued Medications   No medications on file     Physical Exam:  Filed Vitals:   10/17/14 1514  BP: 150/69  Pulse: 78  Temp: 97.4 F (36.3 C)  Resp: 20  Weight: 132 lb (59.875 kg)    General- elderly female in no acute distress Head- atraumatic, normocephalic Eyes- PERRLA, EOMI, no pallor, no icterus, no discharge Neck- no cervical  lymphadenopathy Throat- moist mucus membrane Cardiovascular- normal s1,s2, no murmurs, distal pulses intact, trace left leg edema Respiratory- bilateral clear to auscultation, no wheeze, no rhonchi, no crackles, no use of accessory muscles Abdomen- bowel sounds present, soft, non tender Musculoskeletal- able to move all 4 extremities, left leg rom limited, using wheelchair and walker with assistance Neurological- no focal deficit Skin- warm and dry, dry dressing on left hip Psychiatry- alert and oriented to person, place and time, normal mood and affect    Labs reviewed: Basic Metabolic Panel:  Recent Labs  10/13/14 0506 10/14/14 0522 10/15/14 0620  NA 135* 133* 136*  K 4.4 4.3 4.1  CL 100 97 99  CO2 24 26 27   GLUCOSE 88 110* 103*  BUN 13 11 9   CREATININE 0.73 0.67 0.67  CALCIUM 8.5 8.5 8.7   Liver Function Tests:  Recent Labs  10/12/14 0610  AST 22  ALT 13  ALKPHOS 78  BILITOT 0.8  PROT 6.4  ALBUMIN 3.2*   No results for input(s): LIPASE, AMYLASE in the last 8760 hours. No results for input(s): AMMONIA in the last 8760 hours. CBC:  Recent Labs  10/11/14 1559  10/13/14 0506 10/14/14 0522 10/15/14 0620  WBC 12.4*  < > 6.8 6.6 5.0  NEUTROABS 11.4*  --   --   --   --   HGB 12.4  < > 10.0* 8.7* 9.2*  HCT 38.0  < > 29.5* 26.5* 27.7*  MCV 90.3  < > 90.2 88.0 90.8  PLT 274  < > 203 209 257  < > = values in this interval not displayed. Cardiac Enzymes:  Recent Labs  10/11/14 1929 10/11/14 2027 10/12/14 0610  TROPONINI <0.30 <0.30 <0.30     Assessment/Plan  Left hip fracture S/p repair. Will have her work with physical therapy and occupational therapy team to help with gait training and muscle strengthening exercises.fall precautions. Skin care. Encourage to be out of bed. WBAT. Has f/u with orthopedics. Pain under control with tylenol. Continue calcium and vit d supplement. Continue asa 325 daily for dvt prophylaxis. Check cbc and bmp  Muscle  spasm Add robaxin 500 mg bid prn and reassess  Dysphagia Seen by SLP, on mechanical soft diet with soft solid, ground meats and thin liquid for now. Aspiration precautions  gerd Continue prilosec daily  Constipation Change miralax to daily. Continue colace bid  Iron def anemia Monitor cbc, continue ferrous sulfate  Hypothyroidism Continue levothyroxine and monitor  Family/ staff Communication: reviewed care plan with patient and nursing supervisor   Goals of care: short term rehabilitation    Labs/tests ordered- cbc, bmp    Blanchie Serve, MD  [facility] [phone] (Monday-Friday 8 am - 5 pm) [phone] (afterhours)

## 2014-10-18 LAB — BASIC METABOLIC PANEL
BUN: 17 mg/dL (ref 4–21)
Creatinine: 0.6 mg/dL (ref 0.5–1.1)
Glucose: 103 mg/dL
Potassium: 4.4 mmol/L (ref 3.4–5.3)
Sodium: 129 mmol/L — AB (ref 137–147)

## 2014-10-18 LAB — CBC AND DIFFERENTIAL
HCT: 32 % — AB (ref 36–46)
Hemoglobin: 10.2 g/dL — AB (ref 12.0–16.0)
Platelets: 507 10*3/uL — AB (ref 150–399)
WBC: 8.4 10^3/mL

## 2014-10-20 ENCOUNTER — Non-Acute Institutional Stay (SKILLED_NURSING_FACILITY): Payer: Medicare Other | Admitting: Registered Nurse

## 2014-10-20 ENCOUNTER — Encounter: Payer: Self-pay | Admitting: Registered Nurse

## 2014-10-20 DIAGNOSIS — K5900 Constipation, unspecified: Secondary | ICD-10-CM

## 2014-10-20 DIAGNOSIS — R4589 Other symptoms and signs involving emotional state: Secondary | ICD-10-CM | POA: Insufficient documentation

## 2014-10-20 DIAGNOSIS — G4700 Insomnia, unspecified: Secondary | ICD-10-CM | POA: Insufficient documentation

## 2014-10-20 DIAGNOSIS — F329 Major depressive disorder, single episode, unspecified: Secondary | ICD-10-CM

## 2014-10-20 NOTE — Progress Notes (Signed)
Patient ID: RAND BOLLER, female   DOB: 1932/09/06, 80 y.o.   MRN: [id number]   Place of Service: [facility]  Allergies  Allergen Reactions  . Codeine Camsylate [Codeine] Other (See Comments)    Caused severe headaches, dizziness and confusion  . Zithromax [Azithromycin] Nausea And Vomiting    Code Status: Full Code  Goals of Care: Longevity/STR  Chief Complaint  Patient presents with  . Acute Visit    depression, constipation    HPI 80 y.o. female with PMH of Left hip fx s/p left hemiarthroplasty, thyroid cancer s/p hypothyroidism post thyroidectomy, GERD, and constipation is being seen for an acute visit at the request of nursing staff for the evaluation of constipation and depression. A KUB was ordered this morning, since then the patient has had 3 small BMs. She told me she felt much better now. Regarding her mood, she reported feeling depressed last night and this has been going intermittently over the past few months since her back surgery. She also reported problem with falling asleep at night. ROS otherwise unremarkable.   Review of Systems Constitutional: Negative for fever and chills Eyes: Negative for eye pain and visual disturbance Cardiovascular: Negative for chest pain and palpitations.  Respiratory: Negative shortness of breath, and wheezing. Gastrointestinal: Negative for nausea and vomiting. Negative for abdominal pain. Positive for constipation Neurological: Negative for dizziness and headache.   Skin: Negative for rash  Psychiatric: Negative for nervous/anxious. Positive for depression and insomnia  Past Medical History  Diagnosis Date  . Thyroid disease   . Cancer     Past Surgical History  Procedure Laterality Date  . Total thyroidectomy    . Hip arthroplasty Left 10/12/2014    Procedure: ARTHROPLASTY BIPOLAR HIP;  Surgeon: Marybelle Killings, MD;  Location: [place];  Service: Orthopedics;  Laterality: Left;      Medication List       This  list is accurate as of: 10/20/14  5:48 PM.  Always use your most recent med list.               acetaminophen 325 MG tablet  Commonly known as:  TYLENOL  Take 2 tablets (650 mg total) by mouth every 6 (six) hours as needed for mild pain, moderate pain, fever or headache.     aspirin EC 325 MG tablet  Take 1 tablet (325 mg total) by mouth daily.     bisacodyl 10 MG suppository  Commonly known as:  DULCOLAX  Place 1 suppository (10 mg total) rectally daily as needed for moderate constipation.     CALCIUM & VIT D3 BONE HEALTH PO  Take 1 tablet by mouth daily.     DSS 100 MG Caps  Take 100 mg by mouth 2 (two) times daily.     ferrous sulfate 220 (44 FE) MG/5ML solution  Take 5 mLs (220 mg total) by mouth 2 (two) times daily with a meal.     ibuprofen 200 MG tablet  Commonly known as:  ADVIL,MOTRIN  Take 1 tablet (200 mg total) by mouth every 6 (six) hours as needed for moderate pain (for pain not controlled with tylenol.).     levothyroxine 75 MCG tablet  Commonly known as:  SYNTHROID, LEVOTHROID  Take 1.5 tablets (112.5 mcg total) by mouth daily before breakfast.     methocarbamol 500 MG tablet  Commonly known as:  ROBAXIN  Take 500 mg by mouth every 12 (twelve) hours as needed for muscle spasms.  multivitamin with minerals tablet  Take 1 tablet by mouth daily.     omeprazole 20 MG capsule  Commonly known as:  PRILOSEC  Take 1 capsule (20 mg total) by mouth daily.     polyethylene glycol packet  Commonly known as:  MIRALAX / GLYCOLAX  Take 17 g by mouth daily as needed for mild constipation.     VITAMIN D-3 PO  Take 1 tablet by mouth daily.        Physical Exam Filed Vitals:   10/20/14 1732  BP: 144/81  Pulse: 72  Temp: 97.5 F (36.4 C)  Resp: 18   Constitutional: WDWN elderly female in no acute distress. Very pleasant and conversant.   HEENT: Normocephalic and atraumatic. PERRL. EOM intact. No icterus. Wears eyeglasses. Oral mucosa moist. Posterior  pharynx clear of any exudate or lesions. Teeth and gingiva in good general condition.  Neck: Supple and nontender. No lymphadenopathy, masses, or thyromegaly. No JVD or carotid bruits. Cardiac: Normal S1, S2. RRR without appreciable murmurs, rubs, or gallops. Distal pulses intact. Trace pitting edema of BLE  Lungs: No respiratory distress. Breath sounds clear bilaterally without rales, rhonchi, or wheezes. Abdomen: Audible bowel sounds in all quadrants. Soft, nontender, nondistended.  Musculoskeletal: Able to move all extremities. No joint erythema or tenderness. Neurological: Alert and oriented to person, place, and time. No focal deficits.  Psychiatric: Judgment and insight adequate. Appropriate mood and affect.   Labs Reviewed CBC Latest Ref Rng 10/18/2014 10/15/2014 10/14/2014  WBC - 8.4 5.0 6.6  Hemoglobin 12.0 - 16.0 g/dL 10.2(A) 9.2(L) 8.7(L)  Hematocrit 36 - 46 % 32(A) 27.7(L) 26.5(L)  Platelets 150 - 399 K/L 507(A) 257 209    CMP     Component Value Date/Time   NA 129* 10/18/2014   NA 136* 10/15/2014 0620   K 4.4 10/18/2014   CL 99 10/15/2014 0620   CO2 27 10/15/2014 0620   GLUCOSE 103* 10/15/2014 0620   BUN 17 10/18/2014   BUN 9 10/15/2014 0620   CREATININE 0.6 10/18/2014   CREATININE 0.67 10/15/2014 0620   CALCIUM 8.7 10/15/2014 0620   PROT 6.4 10/12/2014 0610   ALBUMIN 3.2* 10/12/2014 0610   AST 22 10/12/2014 0610   ALT 13 10/12/2014 0610   ALKPHOS 78 10/12/2014 0610   BILITOT 0.8 10/12/2014 0610   GFRNONAA 81* 10/15/2014 0620   GFRAA >90 10/15/2014 0620    Lab Results  Component Value Date   TSH 0.063* 10/11/2014    Diagnostic Studies Reviewed 10/11/14-DG Hip Complete Left Impression: acute left femoral fractures displaced 2cm. 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 Healing sacral fx with sclerotic changes  10/14/14-DG esophagus (ESOPHOGRAM/BARIUM SWALLOW) IMPRESSION: 1. Cervical web which inhibits passage of pill  into the thoracic esophagus. 2. Zenker's diverticulum. 3. Patent appearance of the thoracic esophagus.  10/12/14: 2-D Echo -Left ventricle: The cavity size was normal. Systolic function wasnormal. The estimated ejection fraction was in the range of 60% to 65%. Wall motion was normal; there were no regional wall motion abnormalities. There was an increased relative contribution of atrial contraction to ventricular filling. Doppler parameters are consistent with abnormal left ventricular relaxation (grade 1 diastolic dysfunction). - Aortic valve: Poorly visualized. There was trivial regurgitation. - Mitral valve: Calcified annulus. - Right atrium: The atrium was moderately dilated. - Atrial septum: There was increased thickness of the septum, consistent with lipomatous hypertrophy. - Tricuspid valve: There was moderate regurgitation. - Pulmonic valve: There was trivial regurgitation. - Pulmonary arteries: PA  peak pressure: 38 mm Hg (S).  Impressions: The right ventricular systolic pressure was increased consistent with mild pulmonary hypertension.  Assessment & Plan  1. Constipation, unspecified constipation type Pending KUB. BM x 3 today. ABD exam normal. Continue colace 100mg  BID, miralax 17g daily, and dulcolax 10mg  PR daily PRN. Continue to monitor.   2. Depressed mood Patient declined medications at this time. Will continue to monitor for change in mood.   3. Insomnia Start melatonin 1mg  daily at bedtime and monitor.    Family/Staff Communication Plan of care discuss with patient and nursing staff. Patient and nursing staff verbalize understanding and agree with plan of care. No additional questions or concerns reported.    Arthur Holms, MSN, AGNP-C [facility] [address] [place],  [address] [phone] [8am-5pm] After hours: [phone]

## 2014-10-28 ENCOUNTER — Ambulatory Visit: Payer: Medicare Other | Admitting: Physician Assistant

## 2014-10-30 ENCOUNTER — Non-Acute Institutional Stay (SKILLED_NURSING_FACILITY): Payer: Medicare Other | Admitting: Registered Nurse

## 2014-10-30 ENCOUNTER — Encounter: Payer: Self-pay | Admitting: Registered Nurse

## 2014-10-30 DIAGNOSIS — E890 Postprocedural hypothyroidism: Secondary | ICD-10-CM

## 2014-10-30 DIAGNOSIS — Z9660 Presence of unspecified orthopedic joint implant: Secondary | ICD-10-CM

## 2014-10-30 DIAGNOSIS — R1310 Dysphagia, unspecified: Secondary | ICD-10-CM

## 2014-10-30 DIAGNOSIS — Z96649 Presence of unspecified artificial hip joint: Secondary | ICD-10-CM

## 2014-10-30 DIAGNOSIS — K219 Gastro-esophageal reflux disease without esophagitis: Secondary | ICD-10-CM

## 2014-10-30 DIAGNOSIS — K5900 Constipation, unspecified: Secondary | ICD-10-CM

## 2014-10-30 DIAGNOSIS — G4700 Insomnia, unspecified: Secondary | ICD-10-CM

## 2014-10-30 NOTE — Progress Notes (Signed)
Patient ID: Jackie [name], female   DOB: 10-25-1932, 80 y.o.   MRN: [id number]   Place of Service: [facility]  Allergies  Allergen Reactions  . Codeine Camsylate [Codeine] Other (See Comments)    Caused severe headaches, dizziness and confusion  . Zithromax [Azithromycin] Nausea And Vomiting    Code Status: Full Code  Goals of Care: Longevity/STR  Chief Complaint  Patient presents with  . Discharge Note    HPI 80 y.o. female with PMH of thyroid cancer s/p hypothyroidism post thyroidectomy, GERD, dysphagia,, and constipation is being seen for a discharge visit. Patient was here for short-term rehabilitation post hospital admission from 10/11/14 to 10/15/14 for s/p left hip replacement. Patient has worked with therapy team and is ready to be discharged home with [facility] PT/OT/ST and DME  Review of Systems Constitutional: Negative for fever, chills, and fatigue. HENT: Negative for ear pain, congestion, and sore throat Eyes: Negative for eye pain, eye discharge, and visual disturbance  Cardiovascular: Negative for chest pain, palpitations, and leg swelling Respiratory: Negative cough, shortness of breath, and wheezing.  Gastrointestinal: Negative for nausea and vomiting. Positive for constipation but is doing much better Genitourinary: Negative for  dysuria, frequency, urgency, and hematuria Endocrine: Negative for polydipsia, polyphagia, and polyuria Musculoskeletal: Positive for back pain   Neurological: Negative for dizziness and headache.   Skin: Negative for rash. Psychiatric: Negative for depression.   Past Medical History  Diagnosis Date  . Thyroid disease   . Cancer     Past Surgical History  Procedure Laterality Date  . Total thyroidectomy    . Hip arthroplasty Left 10/12/2014    Procedure: ARTHROPLASTY BIPOLAR HIP;  Surgeon: Marybelle Killings, MD;  Location: [place];  Service: Orthopedics;  Laterality: Left;    History   Social History  . Marital Status:  Widowed    Spouse Name: N/A    Number of Children: N/A  . Years of Education: N/A   Occupational History  . Not on file.   Social History Main Topics  . Smoking status: Never Smoker   . Smokeless tobacco: Not on file  . Alcohol Use: No  . Drug Use: No  . Sexual Activity: Not on file   Other Topics Concern  . Not on file   Social History Narrative       Medication List       This list is accurate as of: 10/30/14 10:38 AM.  Always use your most recent med list.               acetaminophen 325 MG tablet  Commonly known as:  TYLENOL  Take 2 tablets (650 mg total) by mouth every 6 (six) hours as needed for mild pain, moderate pain, fever or headache.     aspirin EC 325 MG tablet  Take 1 tablet (325 mg total) by mouth daily.     bisacodyl 10 MG suppository  Commonly known as:  DULCOLAX  Place 1 suppository (10 mg total) rectally daily as needed for moderate constipation.     CALCIUM & VIT D3 BONE HEALTH PO  Take 1 tablet by mouth daily.     DSS 100 MG Caps  Take 100 mg by mouth 2 (two) times daily.     ibuprofen 200 MG tablet  Commonly known as:  ADVIL,MOTRIN  Take 1 tablet (200 mg total) by mouth every 6 (six) hours as needed for moderate pain (for pain not controlled with tylenol.).  levothyroxine 75 MCG tablet  Commonly known as:  SYNTHROID, LEVOTHROID  Take 1.5 tablets (112.5 mcg total) by mouth daily before breakfast.     methocarbamol 500 MG tablet  Commonly known as:  ROBAXIN  Take 500 mg by mouth every 12 (twelve) hours as needed for muscle spasms.     multivitamin with minerals tablet  Take 1 tablet by mouth daily.     omeprazole 20 MG capsule  Commonly known as:  PRILOSEC  Take 1 capsule (20 mg total) by mouth daily.     polyethylene glycol packet  Commonly known as:  MIRALAX / GLYCOLAX  Take 17 g by mouth daily as needed for mild constipation.     VITAMIN D-3 PO  Take 1 tablet by mouth daily.        Physical Exam Filed Vitals:    10/30/14 0932  BP: 130/61  Pulse: 73  Temp: 97.8 F (36.6 C)  Resp: 20   Constitutional: WDWN adult/elderly female in no acute distress.  HEENT: Normocephalic and atraumatic. PERRL. EOM intact. No icterus. External auditory canals patent, auricles without lesions. No nasal discharge or sinus tenderness. Oral mucosa moist. Posterior pharynx clear of any exudate or lesions.  Neck: Supple and nontender. No lymphadenopathy, masses, or thyromegaly. No JVD or carotid bruits. Cardiac: Normal S1, S2. RRR without appreciable murmurs, rubs, or gallops. Distal pulses intact. No dependent edema.  Lungs: No respiratory distress. Breath sounds clear bilaterally without rales, rhonchi, or wheezes. Abdomen: Audible bowel sounds in all quadrants. Soft, nontender, nondistended. No palpable mass.  Musculoskeletal: able to move all extremities. No joint erythema or tenderness. Skin: Warm and dry. No rash noted. No erythema. Surgical incision w/o signs of infection Neurological: Alert and oriented to person, place, and time.  Psychiatric: Judgment and insight adequate. Appropriate mood and affect.   Labs Reviewed  CBC Latest Ref Rng 10/18/2014 10/15/2014 10/14/2014  WBC - 8.4 5.0 6.6  Hemoglobin 12.0 - 16.0 g/dL 10.2(A) 9.2(L) 8.7(L)  Hematocrit 36 - 46 % 32(A) 27.7(L) 26.5(L)  Platelets 150 - 399 K/L 507(A) 257 209    CMP Latest Ref Rng 10/18/2014 10/15/2014 10/14/2014  Glucose 70 - 99 mg/dL - 103(H) 110(H)  BUN 4 - 21 mg/dL 17 9 11   Creatinine 0.5 - 1.1 mg/dL 0.6 0.67 0.67  Sodium 137 - 147 mmol/L 129(A) 136(L) 133(L)  Potassium 3.4 - 5.3 mmol/L 4.4 4.1 4.3  Chloride 96 - 112 mEq/L - 99 97  CO2 19 - 32 mEq/L - 27 26  Calcium 8.4 - 10.5 mg/dL - 8.7 8.5  Total Protein 6.0 - 8.3 g/dL - - -  Total Bilirubin 0.3 - 1.2 mg/dL - - -  Alkaline Phos 39 - 117 U/L - - -  AST 0 - 37 U/L - - -  ALT 0 - 35 U/L - - -   Lab Results  Component Value Date   TSH 0.063* 10/11/2014    Assessment & Plan 1.  S/P hip replacement Continue to work with [facility] PT/OT for gait/strength/balance training. Pain is well controlled on OTC regimen. Continue tylenol 650mg  every six hours as needed and ibuprofen 200mg  every six hours as needed with robaxin 500mg  every 12 hours as needed for muscle spasms  2. Constipation, unspecified constipation type Continue miralax daily  3. Postoperative hypothyroidism Continue levothyroxine 19mcg daily. Please have PCP check TSH level and adjust dosage as needed.   4. Insomnia Continue melatonin 1mg  daily. Can increase to 3mg  if needed.   5. Gastroesophageal  reflux disease without esophagitis Continue omeprazole 20mg  daily.  6. Dysphagia Tolerated regular/thin liquid well. HH ST was recommended. F/u with GI.   Home health services: PT/OT/ST DME required: [facility], 3-1 PCP follow-up: 11/17/14 @ 2:30 pm with Dr. [name] 30-day supply of prescription medications provided.   Family/Staff Communication Plan of care discussed with patient and nursing staff. Patient and nursing staff verbalized understanding and agree with plan of care. No additional questions or concerns reported.    Arthur Holms, MSN, AGNP-C [facility] [address] [place], [place] [address] [phone] [8am-5pm] After hours: [phone]

## 2014-11-17 DIAGNOSIS — D500 Iron deficiency anemia secondary to blood loss (chronic): Secondary | ICD-10-CM | POA: Insufficient documentation

## 2014-11-17 DIAGNOSIS — E871 Hypo-osmolality and hyponatremia: Secondary | ICD-10-CM | POA: Insufficient documentation

## 2015-02-12 DIAGNOSIS — R32 Unspecified urinary incontinence: Secondary | ICD-10-CM | POA: Insufficient documentation

## 2015-02-13 ENCOUNTER — Ambulatory Visit (INDEPENDENT_AMBULATORY_CARE_PROVIDER_SITE_OTHER): Payer: Medicare Other | Admitting: Podiatrist

## 2015-02-13 ENCOUNTER — Encounter: Payer: Self-pay | Admitting: Podiatrist

## 2015-02-13 VITALS — BP 145/77 | HR 74 | Resp 12

## 2015-02-13 DIAGNOSIS — Q828 Other specified congenital malformations of skin: Secondary | ICD-10-CM | POA: Diagnosis not present

## 2015-02-13 DIAGNOSIS — M2580 Other specified joint disorders, unspecified joint: Secondary | ICD-10-CM

## 2015-02-13 DIAGNOSIS — M216X9 Other acquired deformities of unspecified foot: Secondary | ICD-10-CM

## 2015-02-13 NOTE — Progress Notes (Signed)
   Subjective:    Patient ID: Jackie [name], female    DOB: 02/25/1932, 81 y.o.   MRN: [id number]  HPI  PT STATED B/L FOOT HAVE HARD SKIN AND PAINFUL ESPECIALLY LT FOOT FOR 6 WEEKS. FEET ARE GETTING WORSE ESPECIALLY WHEN PUTTING PRESSURE ON IT. TRIED NO TREATMENT.(AP)  Patient relates she recently had a hip surgery on the left hip in that she's been doing a lot of walking for rehabilitation. She is unable to bend down to care for her left foot due to the inability to bend because of the new hip. She has significant pain submetatarsal one of the left foot and wonders if she might have a broken bone.  Review of Systems  Musculoskeletal: Positive for gait problem.  Skin: Positive for color change.  All other systems reviewed and are negative.      Objective:   Physical Exam  GENERAL APPEARANCE: Alert, conversant. Appropriately groomed. No acute distress.  VASCULAR: Pedal pulses palpable at 2/4 DP and PT bilateral.  Capillary refill time is immediate to all digits,  Proximal to distal cooling it warm to warm.  Digital hair growth is present bilateral  NEUROLOGIC: sensation is intact epicritically and protectively to 5.07 monofilament at 5/5 sites bilateral.  Light touch is intact bilateral, vibratory sensation intact bilateral, achilles tendon reflex is intact bilateral.  MUSCULOSKELETAL: acceptable muscle strength, tone and stability bilateral. Prominent first metatarsal head is seen left greater than right with pain along the fibular sesamoid left greater than right.  Slight swelling is noted in this area. Hallux is dorsiflexed in contracture causing more direct weightbearing on the sesamoids left greater than right  DERMATOLOGIC: Fat pad atrophy is noted on the plantar aspect of the left greater than right foot. Hyperkeratotic lesion is located directly over the fibular sesamoid left and right. Patient's digital toenails are elongated, thickened and appear clinically mycotic left hallux being  the most symptomatic.      Assessment & Plan:  Sesamoiditis, prominent metatarsals, dorsiflexed contracture of the hallux left, calluses 2  Plan: Recommended an injection of steroid to try and calm down the inflammation and improve pain on the left foot and this was carried out around the fibular sesamoid today with dexamethasone and Marcaine mixture without complication. Also debrided down the calluses on both feet and applied padding to her shoes to offload the area. If this is not beneficial she will call otherwise she'll be seen back as needed for follow-up.

## 2015-02-13 NOTE — Patient Instructions (Signed)
Sesamoiditis Sesamoid bones are bones that are completely enclosed by a tendon. The most recognizable sesamoid bone is the kneecap (patella). Your body also has sesamoid bones in the hands and feet. Sesamoid bones of the feet are more commonly injured than those of the hand. Sesamoid bones in the feet may be injured because of the force placed on them while standing, walking, running, or jumping. Sesamoid injuries include:   Inflammation of the sesamoid (sesamoiditis).  Fracture.  Stress fracture. The sesamoid bone on the base of the big toe is especially susceptible. SYMPTOMS   Pain with weight bearing on the foot, such as with standing, walking, running, jumping, or dancing.  Pain with trying to lift the big toe.  Tenderness and swelling under the base of the big toe. CAUSES  A sesamoid injury is typically caused by acute trauma or overuse trauma to the foot. This may include jumping and landing on the ball of the foot or jumping or dancing on the balls of the feet. Other causes include:  Interrupted blood supply (avascular necrosis).  Infection. RISK INCREASES WITH:  Sports that require jumping from a great height or repeated jumping or standing on the balls of the feet. These include:  Basketball.  Ballet.  Jogging.  Long-distance running.  Shoes that are too small or have very high heels.  Large or poorly shaped sesamoid bone.  Bunions. PREVENTION  Warm up and stretch properly before activity.  Maintain appropriate conditioning:  Ankle and leg flexibility.  Muscle strength and endurance.  Learn and use proper technique and have a coach correct improper technique.  Wear taping, protective strapping, bracing, or padding.  Wear shoes that are the proper size and ensure correct fit. PROGNOSIS If detected early and treated properly, sesamoid injuries are usually curable within 4 to 6 months.  RELATED COMPLICATIONS   Prolonged healing time if not appropriately  treated or if not given enough time to heal.  Fracture does not heal (nonunion).  Prolonged disability.  Frequent recurrence of symptoms. Appropriately addressing the problem with rehabilitation decreases frequency of recurrence and optimizes healing time.  Arthritis of the joint between the sesamoid and the rest of the big toe.  Complications of surgery, including infection, bleeding, injury to nerves, continued pain, bunion or reverse bunion formation, toe weakness, and toe hyperextension. TREATMENT Treatment initially involves the use of ice and medication to reduce pain and inflammation. It may be recommended for you to modify your activities, so they do not cause an increase in the severity of symptoms. Depending on the severity of the injury, you may be required to use crutches in order to keep weight off of the injury. Padding, bracing, or taping the area may help reduce pain. Casting of the leg and foot, a walking boot, or a stiff-soled shoe (with or without an arch support) may also be helpful. For cases of chronic sesamoid symptoms, the use of physical therapy may be recommended. On occasion, corticosteroid injections are given to reduce inflammation. It is uncommon, but possible, for surgery to be necessary to remove the sesamoid bone. MEDICATION   If pain medication is necessary, nonsteroidal anti-inflammatory medications such as aspirin and ibuprofen or other minor pain relievers such as acetaminophen are often recommended.  Do not take pain medication for 7 days before surgery.  Prescription pain relievers are usually only prescribed after surgery. Use only as directed and only as much as you need.  Corticosteroid injections may be given to reduce inflammation. However, these injections may  only be given a certain number of times. HEAT AND COLD Cold treatment (icing) relieves pain and reduces inflammation. Cold treatment should be applied for 10 to 15 minutes every 2 to 3 hours  for inflammation and pain and immediately after any activity that aggravates your symptoms. Use ice packs or massage the area with a piece of ice (ice massage). SEEK MEDICAL CARE IF:   Symptoms get worse or do not improve in 6 weeks despite treatment.  Any signs of infection develop, including fever, headaches, muscular aches and weakness, fatigue, redness, warmth, or increased swelling or pain.  Any of the following occur after surgery:  You experience pain, numbness, or coldness in the foot and ankle.  Blue, gray, or dark color appears in the toenails.  Signs of infection develop, including fever, increased pain, swelling, redness, drainage, or bleeding in the surgical area.  New, unexplained symptoms develop (drugs used in treatment may produce side effects including bleeding, stomach upset, and allergic reactions). Document Released: 11/14/2005 Document Revised: 02/06/2012 Document Reviewed: 02/26/2009 [facility] Patient Information 2015 [place], [place]. This information is not intended to replace advice given to you by your health care provider. Make sure you discuss any questions you have with your health care provider.

## 2015-06-25 DIAGNOSIS — E890 Postprocedural hypothyroidism: Secondary | ICD-10-CM | POA: Diagnosis present

## 2015-06-25 DIAGNOSIS — K225 Diverticulum of esophagus, acquired: Secondary | ICD-10-CM | POA: Insufficient documentation

## 2015-07-15 DIAGNOSIS — H8110 Benign paroxysmal vertigo, unspecified ear: Secondary | ICD-10-CM | POA: Insufficient documentation

## 2016-02-09 ENCOUNTER — Encounter (HOSPITAL_COMMUNITY): Payer: Self-pay

## 2016-02-09 ENCOUNTER — Ambulatory Visit (HOSPITAL_COMMUNITY)
Admission: RE | Admit: 2016-02-09 | Discharge: 2016-02-09 | Disposition: A | Discharge status: Home / Self Care | Payer: Medicare Other | Source: Ambulatory Visit | Attending: Endocrinology | Admitting: Endocrinology

## 2016-02-09 DIAGNOSIS — M810 Age-related osteoporosis without current pathological fracture: Secondary | ICD-10-CM | POA: Diagnosis not present

## 2016-02-09 HISTORY — DX: Age-related osteoporosis without current pathological fracture: M81.0

## 2016-02-09 MED ORDER — DENOSUMAB 60 MG/ML ~~LOC~~ SOLN
60.0000 mg | Freq: Once | SUBCUTANEOUS | Status: AC
  Administered 2016-02-09: 60 mg via SUBCUTANEOUS
  Filled 2016-02-09: qty 1

## 2016-02-09 NOTE — Discharge Instructions (Signed)
PROLIA °Denosumab injection °What is this medicine? °DENOSUMAB (den oh sue mab) slows bone breakdown. Prolia is used to treat osteoporosis in women after menopause and in men. Xgeva is used to prevent bone fractures and other bone problems caused by cancer bone metastases. Xgeva is also used to treat giant cell tumor of the bone. °This medicine may be used for other purposes; ask your health care provider or pharmacist if you have questions. °What should I tell my health care provider before I take this medicine? °They need to know if you have any of these conditions: °-dental disease °-eczema °-infection or history of infections °-kidney disease or on dialysis °-low blood calcium or vitamin D °-malabsorption syndrome °-scheduled to have surgery or tooth extraction °-taking medicine that contains denosumab °-thyroid or parathyroid disease °-an unusual reaction to denosumab, other medicines, foods, dyes, or preservatives °-pregnant or trying to get pregnant °-breast-feeding °How should I use this medicine? °This medicine is for injection under the skin. It is given by a health care professional in a hospital or clinic setting. °If you are getting Prolia, a special MedGuide will be given to you by the pharmacist with each prescription and refill. Be sure to read this information carefully each time. °For Prolia, talk to your pediatrician regarding the use of this medicine in children. Special care may be needed. For Xgeva, talk to your pediatrician regarding the use of this medicine in children. While this drug may be prescribed for children as young as 13 years for selected conditions, precautions do apply. °Overdosage: If you think you have taken too much of this medicine contact a poison control center or emergency room at once. °NOTE: This medicine is only for you. Do not share this medicine with others. °What if I miss a dose? °It is important not to miss your dose. Call your doctor or health care professional if  you are unable to keep an appointment. °What may interact with this medicine? °Do not take this medicine with any of the following medications: °-other medicines containing denosumab °This medicine may also interact with the following medications: °-medicines that suppress the immune system °-medicines that treat cancer °-steroid medicines like prednisone or cortisone °This list may not describe all possible interactions. Give your health care provider a list of all the medicines, herbs, non-prescription drugs, or dietary supplements you use. Also tell them if you smoke, drink alcohol, or use illegal drugs. Some items may interact with your medicine. °What should I watch for while using this medicine? °Visit your doctor or health care professional for regular checks on your progress. Your doctor or health care professional may order blood tests and other tests to see how you are doing. °Call your doctor or health care professional if you get a cold or other infection while receiving this medicine. Do not treat yourself. This medicine may decrease your body's ability to fight infection. °You should make sure you get enough calcium and vitamin D while you are taking this medicine, unless your doctor tells you not to. Discuss the foods you eat and the vitamins you take with your health care professional. °See your dentist regularly. Brush and floss your teeth as directed. Before you have any dental work done, tell your dentist you are receiving this medicine. °Do not become pregnant while taking this medicine or for 5 months after stopping it. Women should inform their doctor if they wish to become pregnant or think they might be pregnant. There is a potential for serious side   effects to an unborn child. Talk to your health care professional or pharmacist for more information. °What side effects may I notice from receiving this medicine? °Side effects that you should report to your doctor or health care professional as  soon as possible: °-allergic reactions like skin rash, itching or hives, swelling of the face, lips, or tongue °-breathing problems °-chest pain °-fast, irregular heartbeat °-feeling faint or lightheaded, falls °-fever, chills, or any other sign of infection °-muscle spasms, tightening, or twitches °-numbness or tingling °-skin blisters or bumps, or is dry, peels, or red °-slow healing or unexplained pain in the mouth or jaw °-unusual bleeding or bruising °Side effects that usually do not require medical attention (Report these to your doctor or health care professional if they continue or are bothersome.): °-muscle pain °-stomach upset, gas °This list may not describe all possible side effects. Call your doctor for medical advice about side effects. You may report side effects to FDA at 1-800-FDA-1088. °Where should I keep my medicine? °This medicine is only given in a clinic, doctor's office, or other health care setting and will not be stored at home. °NOTE: This sheet is a summary. It may not cover all possible information. If you have questions about this medicine, talk to your doctor, pharmacist, or health care provider. °  °© 2016, Elsevier/Gold Standard. (2012-05-14 12:37:47) °Osteoporosis °Osteoporosis is the thinning and loss of density in the bones. Osteoporosis makes the bones more brittle, fragile, and likely to break (fracture). Over time, osteoporosis can cause the bones to become so weak that they fracture after a simple fall. The bones most likely to fracture are the bones in the hip, wrist, and spine. °CAUSES  °The exact cause is not known. °RISK FACTORS °Anyone can develop osteoporosis. You may be at greater risk if you have a family history of the condition or have poor nutrition. You may also have a higher risk if you are:  °· Female.   °· 80 years old or older. °· A smoker. °· Not physically active.   °· White or Asian. °· Slender. °SIGNS AND SYMPTOMS  °A fracture might be the first sign of the  disease, especially if it results from a fall or injury that would not usually cause a bone to break. Other signs and symptoms include:  °· Low back and neck pain. °· Stooped posture. °· Height loss. °DIAGNOSIS  °To make a diagnosis, your health care provider may: °· Take a medical history. °· Perform a physical exam. °· Order tests, such as: °¨ A bone mineral density test. °¨ A dual-energy X-ray absorptiometry test. °TREATMENT  °The goal of osteoporosis treatment is to strengthen your bones to reduce your risk of a fracture. Treatment may involve: °· Making lifestyle changes, such as: °¨ Eating a diet rich in calcium. °¨ Doing weight-bearing and muscle-strengthening exercises. °¨ Stopping tobacco use. °¨ Limiting alcohol intake. °· Taking medicine to slow the process of bone loss or to increase bone density. °· Monitoring your levels of calcium and vitamin D. °HOME CARE INSTRUCTIONS °· Include calcium and vitamin D in your diet. Calcium is important for bone health, and vitamin D helps the body absorb calcium. °· Perform weight-bearing and muscle-strengthening exercises as directed by your health care provider. °· Do not use any tobacco products, including cigarettes, chewing tobacco, and electronic cigarettes. If you need help quitting, ask your health care provider. °· Limit your alcohol intake. °· Take medicines only as directed by your health care provider. °· Keep all   follow-up visits as directed by your health care provider. This is important. °· Take precautions at home to lower your risk of falling, such as: °¨ Keeping rooms well lit and clutter free. °¨ Installing safety rails on stairs. °¨ Using rubber mats in the bathroom and other areas that are often wet or slippery. °SEEK IMMEDIATE MEDICAL CARE IF:  °You fall or injure yourself.  °  °This information is not intended to replace advice given to you by your health care provider. Make sure you discuss any questions you have with your health care  provider. °  °Document Released: 08/24/2005 Document Revised: 12/05/2014 Document Reviewed: 04/24/2014 °Elsevier Interactive Patient Education ©2016 Elsevier Inc. ° ° °

## 2016-02-09 NOTE — Progress Notes (Signed)
Uneventful 1st injection of Prolia. Pt discharged ambulatory

## 2016-04-27 ENCOUNTER — Ambulatory Visit (INDEPENDENT_AMBULATORY_CARE_PROVIDER_SITE_OTHER): Payer: Medicare Other | Admitting: Podiatry

## 2016-04-27 ENCOUNTER — Encounter: Payer: Self-pay | Admitting: Podiatry

## 2016-04-27 DIAGNOSIS — Q828 Other specified congenital malformations of skin: Secondary | ICD-10-CM | POA: Diagnosis not present

## 2016-04-27 DIAGNOSIS — L609 Nail disorder, unspecified: Secondary | ICD-10-CM | POA: Diagnosis not present

## 2016-04-27 DIAGNOSIS — L608 Other nail disorders: Secondary | ICD-10-CM

## 2016-04-27 DIAGNOSIS — M216X9 Other acquired deformities of unspecified foot: Secondary | ICD-10-CM

## 2016-04-27 NOTE — Patient Instructions (Signed)
Return as needed for trimming of toenails and the plantar skin callus on the ball of the right foot

## 2016-04-28 NOTE — Progress Notes (Signed)
Patient ID: Jackie [name], female   DOB: 26-Nov-1932, 82 y.o.   MRN: [id number]  Subjective: This patient presents today requesting toenails trimmed and complaining of a painful plantar callus on the right foot She has some similar complaints on the visit of 02/13/2015  Objective: Orientated 3 DP and PT pulses 2/4 bilaterally Capillary reflex immediate bilaterally The toenails are incurvated and elongated 6-10 Nucleated plantar keratoses third MPJ right Atrophic fad pad MPJ bilaterally Hallux interphalangeus left Hammertoes 2-4 l, bilaterally  Assessment: Incurvated toenails 6-10 Atrophic fad pad MPJ bilaterally Porokeratosis 1  Plan: Debridement toenails 6-10 mechanically and electrically without a bleeding Debride plantar callus right without a bleeding  rfelt pad around plantar MPJ right  Reappoint at patient's request

## 2016-04-29 DIAGNOSIS — N281 Cyst of kidney, acquired: Secondary | ICD-10-CM | POA: Insufficient documentation

## 2016-08-16 ENCOUNTER — Other Ambulatory Visit (HOSPITAL_COMMUNITY): Payer: Self-pay | Admitting: Endocrinology

## 2016-08-16 ENCOUNTER — Ambulatory Visit (HOSPITAL_COMMUNITY)
Admission: RE | Admit: 2016-08-16 | Discharge: 2016-08-16 | Disposition: A | Discharge status: Home / Self Care | Payer: Medicare Other | Source: Ambulatory Visit | Attending: Endocrinology | Admitting: Endocrinology

## 2016-08-16 ENCOUNTER — Encounter (HOSPITAL_COMMUNITY): Payer: Self-pay

## 2016-08-16 DIAGNOSIS — M810 Age-related osteoporosis without current pathological fracture: Secondary | ICD-10-CM | POA: Insufficient documentation

## 2016-08-16 HISTORY — DX: Urgency of urination: R39.15

## 2016-08-16 MED ORDER — DENOSUMAB 60 MG/ML ~~LOC~~ SOLN
60.0000 mg | Freq: Once | SUBCUTANEOUS | Status: AC
  Administered 2016-08-16: 60 mg via SUBCUTANEOUS
  Filled 2016-08-16: qty 1

## 2016-08-16 NOTE — Discharge Instructions (Signed)
Denosumab injection  What is this medicine?  DENOSUMAB (den oh sue mab) slows bone breakdown. Prolia is used to treat osteoporosis in women after menopause and in men. Xgeva is used to prevent bone fractures and other bone problems caused by cancer bone metastases. Xgeva is also used to treat giant cell tumor of the bone.  This medicine may be used for other purposes; ask your health care provider or pharmacist if you have questions.  What should I tell my health care provider before I take this medicine?  They need to know if you have any of these conditions:  -dental disease  -eczema  -infection or history of infections  -kidney disease or on dialysis  -low blood calcium or vitamin D  -malabsorption syndrome  -scheduled to have surgery or tooth extraction  -taking medicine that contains denosumab  -thyroid or parathyroid disease  -an unusual reaction to denosumab, other medicines, foods, dyes, or preservatives  -pregnant or trying to get pregnant  -breast-feeding  How should I use this medicine?  This medicine is for injection under the skin. It is given by a health care professional in a hospital or clinic setting.  If you are getting Prolia, a special MedGuide will be given to you by the pharmacist with each prescription and refill. Be sure to read this information carefully each time.  For Prolia, talk to your pediatrician regarding the use of this medicine in children. Special care may be needed. For Xgeva, talk to your pediatrician regarding the use of this medicine in children. While this drug may be prescribed for children as young as 13 years for selected conditions, precautions do apply.  Overdosage: If you think you have taken too much of this medicine contact a poison control center or emergency room at once.  NOTE: This medicine is only for you. Do not share this medicine with others.  What if I miss a dose?  It is important not to miss your dose. Call your doctor or health care professional if you are  unable to keep an appointment.  What may interact with this medicine?  Do not take this medicine with any of the following medications:  -other medicines containing denosumab  This medicine may also interact with the following medications:  -medicines that suppress the immune system  -medicines that treat cancer  -steroid medicines like prednisone or cortisone  This list may not describe all possible interactions. Give your health care provider a list of all the medicines, herbs, non-prescription drugs, or dietary supplements you use. Also tell them if you smoke, drink alcohol, or use illegal drugs. Some items may interact with your medicine.  What should I watch for while using this medicine?  Visit your doctor or health care professional for regular checks on your progress. Your doctor or health care professional may order blood tests and other tests to see how you are doing.  Call your doctor or health care professional if you get a cold or other infection while receiving this medicine. Do not treat yourself. This medicine may decrease your body's ability to fight infection.  You should make sure you get enough calcium and vitamin D while you are taking this medicine, unless your doctor tells you not to. Discuss the foods you eat and the vitamins you take with your health care professional.  See your dentist regularly. Brush and floss your teeth as directed. Before you have any dental work done, tell your dentist you are receiving this medicine.  Do   not become pregnant while taking this medicine or for 5 months after stopping it. Women should inform their doctor if they wish to become pregnant or think they might be pregnant. There is a potential for serious side effects to an unborn child. Talk to your health care professional or pharmacist for more information.  What side effects may I notice from receiving this medicine?  Side effects that you should report to your doctor or health care professional as soon as  possible:  -allergic reactions like skin rash, itching or hives, swelling of the face, lips, or tongue  -breathing problems  -chest pain  -fast, irregular heartbeat  -feeling faint or lightheaded, falls  -fever, chills, or any other sign of infection  -muscle spasms, tightening, or twitches  -numbness or tingling  -skin blisters or bumps, or is dry, peels, or red  -slow healing or unexplained pain in the mouth or jaw  -unusual bleeding or bruising  Side effects that usually do not require medical attention (Report these to your doctor or health care professional if they continue or are bothersome.):  -muscle pain  -stomach upset, gas  This list may not describe all possible side effects. Call your doctor for medical advice about side effects. You may report side effects to FDA at 1-800-FDA-1088.  Where should I keep my medicine?  This medicine is only given in a clinic, doctor's office, or other health care setting and will not be stored at home.  NOTE: This sheet is a summary. It may not cover all possible information. If you have questions about this medicine, talk to your doctor, pharmacist, or health care provider.      2016, [employer]. (2012-05-14 12:37:47)

## 2017-02-14 ENCOUNTER — Ambulatory Visit (HOSPITAL_COMMUNITY): Payer: Medicare Other

## 2017-02-15 ENCOUNTER — Ambulatory Visit (HOSPITAL_COMMUNITY)
Admission: RE | Admit: 2017-02-15 | Discharge: 2017-02-15 | Disposition: A | Discharge status: Home / Self Care | Payer: Medicare Other | Source: Ambulatory Visit | Attending: Endocrinology | Admitting: Endocrinology

## 2017-02-24 ENCOUNTER — Encounter (HOSPITAL_COMMUNITY): Payer: Self-pay

## 2017-02-24 ENCOUNTER — Ambulatory Visit (HOSPITAL_COMMUNITY)
Admission: RE | Admit: 2017-02-24 | Discharge: 2017-02-24 | Disposition: A | Discharge status: Home / Self Care | Payer: Medicare Other | Source: Ambulatory Visit | Attending: Endocrinology | Admitting: Endocrinology

## 2017-02-24 DIAGNOSIS — M810 Age-related osteoporosis without current pathological fracture: Secondary | ICD-10-CM | POA: Diagnosis present

## 2017-02-24 MED ORDER — DENOSUMAB 60 MG/ML ~~LOC~~ SOLN
60.0000 mg | Freq: Once | SUBCUTANEOUS | Status: AC
  Administered 2017-02-24: 60 mg via SUBCUTANEOUS
  Filled 2017-02-24: qty 1

## 2017-02-24 NOTE — Discharge Instructions (Signed)
Denosumab injection °What is this medicine? °DENOSUMAB (den oh sue mab) slows bone breakdown. Prolia is used to treat osteoporosis in women after menopause and in men. Xgeva is used to treat a high calcium level due to cancer and to prevent bone fractures and other bone problems caused by multiple myeloma or cancer bone metastases. Xgeva is also used to treat giant cell tumor of the bone. °This medicine may be used for other purposes; ask your health care provider or pharmacist if you have questions. °COMMON BRAND NAME(S): Prolia, XGEVA °What should I tell my health care provider before I take this medicine? °They need to know if you have any of these conditions: °-dental disease °-having surgery or tooth extraction °-infection °-kidney disease °-low levels of calcium or Vitamin D in the blood °-malnutrition °-on hemodialysis °-skin conditions or sensitivity °-thyroid or parathyroid disease °-an unusual reaction to denosumab, other medicines, foods, dyes, or preservatives °-pregnant or trying to get pregnant °-breast-feeding °How should I use this medicine? °This medicine is for injection under the skin. It is given by a health care professional in a hospital or clinic setting. °If you are getting Prolia, a special MedGuide will be given to you by the pharmacist with each prescription and refill. Be sure to read this information carefully each time. °For Prolia, talk to your pediatrician regarding the use of this medicine in children. Special care may be needed. For Xgeva, talk to your pediatrician regarding the use of this medicine in children. While this drug may be prescribed for children as young as 13 years for selected conditions, precautions do apply. °Overdosage: If you think you have taken too much of this medicine contact a poison control center or emergency room at once. °NOTE: This medicine is only for you. Do not share this medicine with others. °What if I miss a dose? °It is important not to miss your  dose. Call your doctor or health care professional if you are unable to keep an appointment. °What may interact with this medicine? °Do not take this medicine with any of the following medications: °-other medicines containing denosumab °This medicine may also interact with the following medications: °-medicines that lower your chance of fighting infection °-steroid medicines like prednisone or cortisone °This list may not describe all possible interactions. Give your health care provider a list of all the medicines, herbs, non-prescription drugs, or dietary supplements you use. Also tell them if you smoke, drink alcohol, or use illegal drugs. Some items may interact with your medicine. °What should I watch for while using this medicine? °Visit your doctor or health care professional for regular checks on your progress. Your doctor or health care professional may order blood tests and other tests to see how you are doing. °Call your doctor or health care professional for advice if you get a fever, chills or sore throat, or other symptoms of a cold or flu. Do not treat yourself. This drug may decrease your body's ability to fight infection. Try to avoid being around people who are sick. °You should make sure you get enough calcium and vitamin D while you are taking this medicine, unless your doctor tells you not to. Discuss the foods you eat and the vitamins you take with your health care professional. °See your dentist regularly. Brush and floss your teeth as directed. Before you have any dental work done, tell your dentist you are receiving this medicine. °Do not become pregnant while taking this medicine or for 5 months after stopping   it. Talk with your doctor or health care professional about your birth control options while taking this medicine. Women should inform their doctor if they wish to become pregnant or think they might be pregnant. There is a potential for serious side effects to an unborn child. Talk  to your health care professional or pharmacist for more information. What side effects may I notice from receiving this medicine? Side effects that you should report to your doctor or health care professional as soon as possible: -allergic reactions like skin rash, itching or hives, swelling of the face, lips, or tongue -bone pain -breathing problems -dizziness -jaw pain, especially after dental work -redness, blistering, peeling of the skin -signs and symptoms of infection like fever or chills; cough; sore throat; pain or trouble passing urine -signs of low calcium like fast heartbeat, muscle cramps or muscle pain; pain, tingling, numbness in the hands or feet; seizures -unusual bleeding or bruising -unusually weak or tired Side effects that usually do not require medical attention (report to your doctor or health care professional if they continue or are bothersome): -constipation -diarrhea -headache -joint pain -loss of appetite -muscle pain -runny nose -tiredness -upset stomach This list may not describe all possible side effects. Call your doctor for medical advice about side effects. You may report side effects to FDA at 1-800-FDA-1088. Where should I keep my medicine? This medicine is only given in a clinic, doctor's office, or other health care setting and will not be stored at home. NOTE: This sheet is a summary. It may not cover all possible information. If you have questions about this medicine, talk to your doctor, pharmacist, or health care provider.  2018 [employer] (2016-12-06 19:17:21)

## 2017-08-29 ENCOUNTER — Ambulatory Visit (HOSPITAL_COMMUNITY): Payer: Medicare Other

## 2017-09-06 ENCOUNTER — Encounter (HOSPITAL_COMMUNITY): Payer: Self-pay

## 2017-09-06 ENCOUNTER — Ambulatory Visit (HOSPITAL_COMMUNITY)
Admission: RE | Admit: 2017-09-06 | Discharge: 2017-09-06 | Disposition: A | Discharge status: Home / Self Care | Payer: Medicare Other | Source: Ambulatory Visit | Attending: Endocrinology | Admitting: Endocrinology

## 2017-09-06 DIAGNOSIS — M810 Age-related osteoporosis without current pathological fracture: Secondary | ICD-10-CM | POA: Diagnosis present

## 2017-09-06 MED ORDER — DENOSUMAB 60 MG/ML ~~LOC~~ SOLN
60.0000 mg | Freq: Once | SUBCUTANEOUS | Status: AC
  Administered 2017-09-06: 60 mg via SUBCUTANEOUS
  Filled 2017-09-06: qty 1

## 2017-09-06 NOTE — Discharge Instructions (Signed)
Denosumab injection °What is this medicine? °DENOSUMAB (den oh sue mab) slows bone breakdown. Prolia is used to treat osteoporosis in women after menopause and in men. Xgeva is used to treat a high calcium level due to cancer and to prevent bone fractures and other bone problems caused by multiple myeloma or cancer bone metastases. Xgeva is also used to treat giant cell tumor of the bone. °This medicine may be used for other purposes; ask your health care provider or pharmacist if you have questions. °COMMON BRAND NAME(S): Prolia, XGEVA °What should I tell my health care provider before I take this medicine? °They need to know if you have any of these conditions: °-dental disease °-having surgery or tooth extraction °-infection °-kidney disease °-low levels of calcium or Vitamin D in the blood °-malnutrition °-on hemodialysis °-skin conditions or sensitivity °-thyroid or parathyroid disease °-an unusual reaction to denosumab, other medicines, foods, dyes, or preservatives °-pregnant or trying to get pregnant °-breast-feeding °How should I use this medicine? °This medicine is for injection under the skin. It is given by a health care professional in a hospital or clinic setting. °If you are getting Prolia, a special MedGuide will be given to you by the pharmacist with each prescription and refill. Be sure to read this information carefully each time. °For Prolia, talk to your pediatrician regarding the use of this medicine in children. Special care may be needed. For Xgeva, talk to your pediatrician regarding the use of this medicine in children. While this drug may be prescribed for children as young as 13 years for selected conditions, precautions do apply. °Overdosage: If you think you have taken too much of this medicine contact a poison control center or emergency room at once. °NOTE: This medicine is only for you. Do not share this medicine with others. °What if I miss a dose? °It is important not to miss your  dose. Call your doctor or health care professional if you are unable to keep an appointment. °What may interact with this medicine? °Do not take this medicine with any of the following medications: °-other medicines containing denosumab °This medicine may also interact with the following medications: °-medicines that lower your chance of fighting infection °-steroid medicines like prednisone or cortisone °This list may not describe all possible interactions. Give your health care provider a list of all the medicines, herbs, non-prescription drugs, or dietary supplements you use. Also tell them if you smoke, drink alcohol, or use illegal drugs. Some items may interact with your medicine. °What should I watch for while using this medicine? °Visit your doctor or health care professional for regular checks on your progress. Your doctor or health care professional may order blood tests and other tests to see how you are doing. °Call your doctor or health care professional for advice if you get a fever, chills or sore throat, or other symptoms of a cold or flu. Do not treat yourself. This drug may decrease your body's ability to fight infection. Try to avoid being around people who are sick. °You should make sure you get enough calcium and vitamin D while you are taking this medicine, unless your doctor tells you not to. Discuss the foods you eat and the vitamins you take with your health care professional. °See your dentist regularly. Brush and floss your teeth as directed. Before you have any dental work done, tell your dentist you are receiving this medicine. °Do not become pregnant while taking this medicine or for 5 months after stopping   it. Talk with your doctor or health care professional about your birth control options while taking this medicine. Women should inform their doctor if they wish to become pregnant or think they might be pregnant. There is a potential for serious side effects to an unborn child. Talk  to your health care professional or pharmacist for more information. What side effects may I notice from receiving this medicine? Side effects that you should report to your doctor or health care professional as soon as possible: -allergic reactions like skin rash, itching or hives, swelling of the face, lips, or tongue -bone pain -breathing problems -dizziness -jaw pain, especially after dental work -redness, blistering, peeling of the skin -signs and symptoms of infection like fever or chills; cough; sore throat; pain or trouble passing urine -signs of low calcium like fast heartbeat, muscle cramps or muscle pain; pain, tingling, numbness in the hands or feet; seizures -unusual bleeding or bruising -unusually weak or tired Side effects that usually do not require medical attention (report to your doctor or health care professional if they continue or are bothersome): -constipation -diarrhea -headache -joint pain -loss of appetite -muscle pain -runny nose -tiredness -upset stomach This list may not describe all possible side effects. Call your doctor for medical advice about side effects. You may report side effects to FDA at 1-800-FDA-1088. Where should I keep my medicine? This medicine is only given in a clinic, doctor's office, or other health care setting and will not be stored at home. NOTE: This sheet is a summary. It may not cover all possible information. If you have questions about this medicine, talk to your doctor, pharmacist, or health care provider.  2018 [employer] (2016-12-06 19:17:21)

## 2018-03-08 ENCOUNTER — Encounter (HOSPITAL_COMMUNITY): Payer: Self-pay

## 2018-03-08 ENCOUNTER — Ambulatory Visit (HOSPITAL_COMMUNITY)
Admission: RE | Admit: 2018-03-08 | Discharge: 2018-03-08 | Disposition: A | Discharge status: Home / Self Care | Payer: Medicare Other | Source: Ambulatory Visit | Attending: Endocrinology | Admitting: Endocrinology

## 2018-03-08 DIAGNOSIS — M810 Age-related osteoporosis without current pathological fracture: Secondary | ICD-10-CM | POA: Insufficient documentation

## 2018-03-08 MED ORDER — DENOSUMAB 60 MG/ML ~~LOC~~ SOLN
60.0000 mg | Freq: Once | SUBCUTANEOUS | Status: AC
  Administered 2018-03-08: 60 mg via SUBCUTANEOUS
  Filled 2018-03-08: qty 1

## 2018-03-08 NOTE — Discharge Instructions (Signed)
911 for emergencies  Call MD for any problems or questions    Prolia Denosumab injection What is this medicine? DENOSUMAB (den oh sue mab) slows bone breakdown. Prolia is used to treat osteoporosis in women after menopause and in men. Delton See is used to treat a high calcium level due to cancer and to prevent bone fractures and other bone problems caused by multiple myeloma or cancer bone metastases. Delton See is also used to treat giant cell tumor of the bone. This medicine may be used for other purposes; ask your health care provider or pharmacist if you have questions. COMMON BRAND NAME(S): Prolia, XGEVA What should I tell my health care provider before I take this medicine? They need to know if you have any of these conditions: -dental disease -having surgery or tooth extraction -infection -kidney disease -low levels of calcium or Vitamin D in the blood -malnutrition -on hemodialysis -skin conditions or sensitivity -thyroid or parathyroid disease -an unusual reaction to denosumab, other medicines, foods, dyes, or preservatives -pregnant or trying to get pregnant -breast-feeding How should I use this medicine? This medicine is for injection under the skin. It is given by a health care professional in a hospital or clinic setting. If you are getting Prolia, a special MedGuide will be given to you by the pharmacist with each prescription and refill. Be sure to read this information carefully each time. For Prolia, talk to your pediatrician regarding the use of this medicine in children. Special care may be needed. For Delton See, talk to your pediatrician regarding the use of this medicine in children. While this drug may be prescribed for children as young as 13 years for selected conditions, precautions do apply. Overdosage: If you think you have taken too much of this medicine contact a poison control center or emergency room at once. NOTE: This medicine is only for you. Do not share this  medicine with others. What if I miss a dose? It is important not to miss your dose. Call your doctor or health care professional if you are unable to keep an appointment. What may interact with this medicine? Do not take this medicine with any of the following medications: -other medicines containing denosumab This medicine may also interact with the following medications: -medicines that lower your chance of fighting infection -steroid medicines like prednisone or cortisone This list may not describe all possible interactions. Give your health care provider a list of all the medicines, herbs, non-prescription drugs, or dietary supplements you use. Also tell them if you smoke, drink alcohol, or use illegal drugs. Some items may interact with your medicine. What should I watch for while using this medicine? Visit your doctor or health care professional for regular checks on your progress. Your doctor or health care professional may order blood tests and other tests to see how you are doing. Call your doctor or health care professional for advice if you get a fever, chills or sore throat, or other symptoms of a cold or flu. Do not treat yourself. This drug may decrease your body's ability to fight infection. Try to avoid being around people who are sick. You should make sure you get enough calcium and vitamin D while you are taking this medicine, unless your doctor tells you not to. Discuss the foods you eat and the vitamins you take with your health care professional. See your dentist regularly. Brush and floss your teeth as directed. Before you have any dental work done, tell your dentist you are receiving this  medicine. Do not become pregnant while taking this medicine or for 5 months after stopping it. Talk with your doctor or health care professional about your birth control options while taking this medicine. Women should inform their doctor if they wish to become pregnant or think they might be  pregnant. There is a potential for serious side effects to an unborn child. Talk to your health care professional or pharmacist for more information. What side effects may I notice from receiving this medicine? Side effects that you should report to your doctor or health care professional as soon as possible: -allergic reactions like skin rash, itching or hives, swelling of the face, lips, or tongue -bone pain -breathing problems -dizziness -jaw pain, especially after dental work -redness, blistering, peeling of the skin -signs and symptoms of infection like fever or chills; cough; sore throat; pain or trouble passing urine -signs of low calcium like fast heartbeat, muscle cramps or muscle pain; pain, tingling, numbness in the hands or feet; seizures -unusual bleeding or bruising -unusually weak or tired Side effects that usually do not require medical attention (report to your doctor or health care professional if they continue or are bothersome): -constipation -diarrhea -headache -joint pain -loss of appetite -muscle pain -runny nose -tiredness -upset stomach This list may not describe all possible side effects. Call your doctor for medical advice about side effects. You may report side effects to FDA at 1-800-FDA-1088. Where should I keep my medicine? This medicine is only given in a clinic, doctor's office, or other health care setting and will not be stored at home. NOTE: This sheet is a summary. It may not cover all possible information. If you have questions about this medicine, talk to your doctor, pharmacist, or health care provider.  2018 [employer] (2016-12-06 19:17:21)

## 2018-07-03 DIAGNOSIS — I10 Essential (primary) hypertension: Secondary | ICD-10-CM | POA: Diagnosis present

## 2018-09-10 ENCOUNTER — Ambulatory Visit (HOSPITAL_COMMUNITY)
Admission: RE | Admit: 2018-09-10 | Discharge: 2018-09-10 | Disposition: A | Discharge status: Home / Self Care | Payer: Medicare Other | Source: Ambulatory Visit | Attending: Endocrinology | Admitting: Endocrinology

## 2018-09-10 ENCOUNTER — Encounter (HOSPITAL_COMMUNITY): Payer: Self-pay

## 2018-09-10 DIAGNOSIS — M810 Age-related osteoporosis without current pathological fracture: Secondary | ICD-10-CM | POA: Insufficient documentation

## 2018-09-10 MED ORDER — DENOSUMAB 60 MG/ML ~~LOC~~ SOSY
60.0000 mg | PREFILLED_SYRINGE | Freq: Once | SUBCUTANEOUS | Status: AC
  Administered 2018-09-10: 60 mg via SUBCUTANEOUS
  Filled 2018-09-10: qty 1

## 2018-09-10 NOTE — Discharge Instructions (Signed)
Denosumab injection / Prolia What is this medicine? DENOSUMAB (den oh sue mab) slows bone breakdown. Prolia is used to treat osteoporosis in women after menopause and in men. Delton See is used to treat a high calcium level due to cancer and to prevent bone fractures and other bone problems caused by multiple myeloma or cancer bone metastases. Delton See is also used to treat giant cell tumor of the bone. This medicine may be used for other purposes; ask your health care provider or pharmacist if you have questions. COMMON BRAND NAME(S): Prolia, XGEVA What should I tell my health care provider before I take this medicine? They need to know if you have any of these conditions: -dental disease -having surgery or tooth extraction -infection -kidney disease -low levels of calcium or Vitamin D in the blood -malnutrition -on hemodialysis -skin conditions or sensitivity -thyroid or parathyroid disease -an unusual reaction to denosumab, other medicines, foods, dyes, or preservatives -pregnant or trying to get pregnant -breast-feeding How should I use this medicine? This medicine is for injection under the skin. It is given by a health care professional in a hospital or clinic setting. If you are getting Prolia, a special MedGuide will be given to you by the pharmacist with each prescription and refill. Be sure to read this information carefully each time. For Prolia, talk to your pediatrician regarding the use of this medicine in children. Special care may be needed. For Delton See, talk to your pediatrician regarding the use of this medicine in children. While this drug may be prescribed for children as young as 13 years for selected conditions, precautions do apply. Overdosage: If you think you have taken too much of this medicine contact a poison control center or emergency room at once. NOTE: This medicine is only for you. Do not share this medicine with others. What if I miss a dose? It is important not to  miss your dose. Call your doctor or health care professional if you are unable to keep an appointment. What may interact with this medicine? Do not take this medicine with any of the following medications: -other medicines containing denosumab This medicine may also interact with the following medications: -medicines that lower your chance of fighting infection -steroid medicines like prednisone or cortisone This list may not describe all possible interactions. Give your health care provider a list of all the medicines, herbs, non-prescription drugs, or dietary supplements you use. Also tell them if you smoke, drink alcohol, or use illegal drugs. Some items may interact with your medicine. What should I watch for while using this medicine? Visit your doctor or health care professional for regular checks on your progress. Your doctor or health care professional may order blood tests and other tests to see how you are doing. Call your doctor or health care professional for advice if you get a fever, chills or sore throat, or other symptoms of a cold or flu. Do not treat yourself. This drug may decrease your body's ability to fight infection. Try to avoid being around people who are sick. You should make sure you get enough calcium and vitamin D while you are taking this medicine, unless your doctor tells you not to. Discuss the foods you eat and the vitamins you take with your health care professional. See your dentist regularly. Brush and floss your teeth as directed. Before you have any dental work done, tell your dentist you are receiving this medicine. Do not become pregnant while taking this medicine or for 5 months  after stopping it. Talk with your doctor or health care professional about your birth control options while taking this medicine. Women should inform their doctor if they wish to become pregnant or think they might be pregnant. There is a potential for serious side effects to an unborn  child. Talk to your health care professional or pharmacist for more information. What side effects may I notice from receiving this medicine? Side effects that you should report to your doctor or health care professional as soon as possible: -allergic reactions like skin rash, itching or hives, swelling of the face, lips, or tongue -bone pain -breathing problems -dizziness -jaw pain, especially after dental work -redness, blistering, peeling of the skin -signs and symptoms of infection like fever or chills; cough; sore throat; pain or trouble passing urine -signs of low calcium like fast heartbeat, muscle cramps or muscle pain; pain, tingling, numbness in the hands or feet; seizures -unusual bleeding or bruising -unusually weak or tired Side effects that usually do not require medical attention (report to your doctor or health care professional if they continue or are bothersome): -constipation -diarrhea -headache -joint pain -loss of appetite -muscle pain -runny nose -tiredness -upset stomach This list may not describe all possible side effects. Call your doctor for medical advice about side effects. You may report side effects to FDA at 1-800-FDA-1088. Where should I keep my medicine? This medicine is only given in a clinic, doctor's office, or other health care setting and will not be stored at home. NOTE: This sheet is a summary. It may not cover all possible information. If you have questions about this medicine, talk to your doctor, pharmacist, or health care provider.  2018 [employer] (2016-12-06 19:17:21)

## 2019-03-13 ENCOUNTER — Ambulatory Visit (HOSPITAL_COMMUNITY): Payer: Medicare Other

## 2019-04-15 ENCOUNTER — Encounter (HOSPITAL_COMMUNITY): Payer: Self-pay

## 2019-04-15 ENCOUNTER — Other Ambulatory Visit: Payer: Self-pay

## 2019-04-15 ENCOUNTER — Encounter (HOSPITAL_COMMUNITY): Payer: Medicare Other

## 2019-04-15 ENCOUNTER — Ambulatory Visit (HOSPITAL_COMMUNITY): Payer: Medicare Other

## 2019-04-15 ENCOUNTER — Ambulatory Visit (HOSPITAL_COMMUNITY)
Admission: RE | Admit: 2019-04-15 | Discharge: 2019-04-15 | Disposition: A | Discharge status: Home / Self Care | Payer: Medicare Other | Source: Ambulatory Visit | Attending: Endocrinology | Admitting: Endocrinology

## 2019-04-15 DIAGNOSIS — M810 Age-related osteoporosis without current pathological fracture: Secondary | ICD-10-CM | POA: Insufficient documentation

## 2019-04-15 MED ORDER — DENOSUMAB 60 MG/ML ~~LOC~~ SOSY
60.0000 mg | PREFILLED_SYRINGE | Freq: Once | SUBCUTANEOUS | Status: AC
  Administered 2019-04-15: 60 mg via SUBCUTANEOUS
  Filled 2019-04-15: qty 1

## 2019-04-15 NOTE — Discharge Instructions (Signed)
Denosumab injection °What is this medicine? °DENOSUMAB (den oh sue mab) slows bone breakdown. Prolia is used to treat osteoporosis in women after menopause and in men, and in people who are taking corticosteroids for 6 months or more. Xgeva is used to treat a high calcium level due to cancer and to prevent bone fractures and other bone problems caused by multiple myeloma or cancer bone metastases. Xgeva is also used to treat giant cell tumor of the bone. °This medicine may be used for other purposes; ask your health care provider or pharmacist if you have questions. °COMMON BRAND NAME(S): Prolia, XGEVA °What should I tell my health care provider before I take this medicine? °They need to know if you have any of these conditions: °-dental disease °-having surgery or tooth extraction °-infection °-kidney disease °-low levels of calcium or Vitamin D in the blood °-malnutrition °-on hemodialysis °-skin conditions or sensitivity °-thyroid or parathyroid disease °-an unusual reaction to denosumab, other medicines, foods, dyes, or preservatives °-pregnant or trying to get pregnant °-breast-feeding °How should I use this medicine? °This medicine is for injection under the skin. It is given by a health care professional in a hospital or clinic setting. °A special MedGuide will be given to you before each treatment. Be sure to read this information carefully each time. °For Prolia, talk to your pediatrician regarding the use of this medicine in children. Special care may be needed. For Xgeva, talk to your pediatrician regarding the use of this medicine in children. While this drug may be prescribed for children as young as 13 years for selected conditions, precautions do apply. °Overdosage: If you think you have taken too much of this medicine contact a poison control center or emergency room at once. °NOTE: This medicine is only for you. Do not share this medicine with others. °What if I miss a dose? °It is important not to  miss your dose. Call your doctor or health care professional if you are unable to keep an appointment. °What may interact with this medicine? °Do not take this medicine with any of the following medications: °-other medicines containing denosumab °This medicine may also interact with the following medications: °-medicines that lower your chance of fighting infection °-steroid medicines like prednisone or cortisone °This list may not describe all possible interactions. Give your health care provider a list of all the medicines, herbs, non-prescription drugs, or dietary supplements you use. Also tell them if you smoke, drink alcohol, or use illegal drugs. Some items may interact with your medicine. °What should I watch for while using this medicine? °Visit your doctor or health care professional for regular checks on your progress. Your doctor or health care professional may order blood tests and other tests to see how you are doing. °Call your doctor or health care professional for advice if you get a fever, chills or sore throat, or other symptoms of a cold or flu. Do not treat yourself. This drug may decrease your body's ability to fight infection. Try to avoid being around people who are sick. °You should make sure you get enough calcium and vitamin D while you are taking this medicine, unless your doctor tells you not to. Discuss the foods you eat and the vitamins you take with your health care professional. °See your dentist regularly. Brush and floss your teeth as directed. Before you have any dental work done, tell your dentist you are receiving this medicine. °Do not become pregnant while taking this medicine or for 5 months   after stopping it. Talk with your doctor or health care professional about your birth control options while taking this medicine. Women should inform their doctor if they wish to become pregnant or think they might be pregnant. There is a potential for serious side effects to an unborn  child. Talk to your health care professional or pharmacist for more information. °What side effects may I notice from receiving this medicine? °Side effects that you should report to your doctor or health care professional as soon as possible: °-allergic reactions like skin rash, itching or hives, swelling of the face, lips, or tongue °-bone pain °-breathing problems °-dizziness °-jaw pain, especially after dental work °-redness, blistering, peeling of the skin °-signs and symptoms of infection like fever or chills; cough; sore throat; pain or trouble passing urine °-signs of low calcium like fast heartbeat, muscle cramps or muscle pain; pain, tingling, numbness in the hands or feet; seizures °-unusual bleeding or bruising °-unusually weak or tired °Side effects that usually do not require medical attention (report to your doctor or health care professional if they continue or are bothersome): °-constipation °-diarrhea °-headache °-joint pain °-loss of appetite °-muscle pain °-runny nose °-tiredness °-upset stomach °This list may not describe all possible side effects. Call your doctor for medical advice about side effects. You may report side effects to FDA at 1-800-FDA-1088. °Where should I keep my medicine? °This medicine is only given in a clinic, doctor's office, or other health care setting and will not be stored at home. °NOTE: This sheet is a summary. It may not cover all possible information. If you have questions about this medicine, talk to your doctor, pharmacist, or health care provider. °© 2019 [employer] (2018-03-23 16:10:44) ° °

## 2019-09-20 ENCOUNTER — Other Ambulatory Visit (HOSPITAL_COMMUNITY): Payer: Self-pay | Admitting: *Deleted

## 2019-09-20 ENCOUNTER — Ambulatory Visit: Payer: Medicare Other | Admitting: Podiatry

## 2019-09-23 ENCOUNTER — Encounter (HOSPITAL_COMMUNITY): Payer: Medicare Other

## 2019-09-23 ENCOUNTER — Other Ambulatory Visit: Payer: Self-pay

## 2019-09-23 ENCOUNTER — Ambulatory Visit (HOSPITAL_COMMUNITY)
Admission: RE | Admit: 2019-09-23 | Discharge: 2019-09-23 | Disposition: A | Discharge status: Home / Self Care | Payer: Medicare Other | Source: Ambulatory Visit | Attending: Endocrinology | Admitting: Endocrinology

## 2019-09-23 DIAGNOSIS — M810 Age-related osteoporosis without current pathological fracture: Secondary | ICD-10-CM | POA: Diagnosis present

## 2019-09-23 MED ORDER — DENOSUMAB 60 MG/ML ~~LOC~~ SOSY
PREFILLED_SYRINGE | SUBCUTANEOUS | Status: AC
  Filled 2019-09-23: qty 1

## 2019-09-23 MED ORDER — DENOSUMAB 60 MG/ML ~~LOC~~ SOSY
60.0000 mg | PREFILLED_SYRINGE | Freq: Once | SUBCUTANEOUS | Status: AC
  Administered 2019-09-23: 60 mg via SUBCUTANEOUS

## 2019-10-14 ENCOUNTER — Ambulatory Visit (HOSPITAL_COMMUNITY): Payer: Medicare Other

## 2019-10-30 DIAGNOSIS — R269 Unspecified abnormalities of gait and mobility: Secondary | ICD-10-CM | POA: Insufficient documentation

## 2019-11-12 DIAGNOSIS — I872 Venous insufficiency (chronic) (peripheral): Secondary | ICD-10-CM | POA: Insufficient documentation

## 2019-12-05 ENCOUNTER — Ambulatory Visit: Payer: Medicare Other | Admitting: Podiatry

## 2019-12-05 ENCOUNTER — Encounter: Payer: Self-pay | Admitting: Podiatry

## 2019-12-05 ENCOUNTER — Telehealth: Payer: Self-pay | Admitting: *Deleted

## 2019-12-05 ENCOUNTER — Other Ambulatory Visit: Payer: Self-pay

## 2019-12-05 DIAGNOSIS — I872 Venous insufficiency (chronic) (peripheral): Secondary | ICD-10-CM

## 2019-12-05 DIAGNOSIS — B351 Tinea unguium: Secondary | ICD-10-CM

## 2019-12-05 DIAGNOSIS — M79674 Pain in right toe(s): Secondary | ICD-10-CM

## 2019-12-05 DIAGNOSIS — M79675 Pain in left toe(s): Secondary | ICD-10-CM

## 2019-12-05 DIAGNOSIS — R609 Edema, unspecified: Secondary | ICD-10-CM | POA: Diagnosis not present

## 2019-12-05 NOTE — Telephone Encounter (Signed)
-----   Message from Trula Slade, DPM sent at 12/05/2019  3:50 PM EST ----- Can you please order a venous reflux study? Thanks

## 2019-12-09 NOTE — Telephone Encounter (Signed)
done

## 2019-12-09 NOTE — Telephone Encounter (Signed)
Faxed required form, demographic to [facility].

## 2019-12-09 NOTE — Progress Notes (Signed)
Subjective:   Patient ID: Jackie [name], female   DOB: 86 y.o.   MRN: [id number]   HPI 86 year old female presents the office today for concerns of thick, elongated toenails that she cannot trim her self also for swelling to her left side.  She states that she is followed up with Dr. Forde Dandy for this.  She states that after she had a hip replacement on her left side is when she noticed the swelling to her left leg and this has been chronic for the last couple of years.  She was recommended to wear compression socks but she was not able to wear them.  She has noticed dark discoloration associated with the swelling.  No open sores.   Review of Systems  All other systems reviewed and are negative.  Past Medical History:  Diagnosis Date  . Cancer ([place]) 2000   thyroid cancer  . Osteoporosis   . Thyroid disease   . Urgency of urination     Past Surgical History:  Procedure Laterality Date  . APPENDECTOMY  1997  . HIP ARTHROPLASTY Left 10/12/2014   Procedure: ARTHROPLASTY BIPOLAR HIP;  Surgeon: Marybelle Killings, MD;  Location: [facility];  Service: Orthopedics;  Laterality: Left;  . TOTAL THYROIDECTOMY  2000     Current Outpatient Medications:  .  acetaminophen (TYLENOL) 325 MG tablet, Take 2 tablets (650 mg total) by mouth every 6 (six) hours as needed for mild pain, moderate pain, fever or headache., Disp: , Rfl:  .  amLODipine (NORVASC) 2.5 MG tablet, Take 1.25 mg by mouth daily., Disp: , Rfl:  .  aspirin 81 MG tablet, Take 81 mg by mouth daily., Disp: , Rfl:  .  bisacodyl (DULCOLAX) 10 MG suppository, Place 1 suppository (10 mg total) rectally daily as needed for moderate constipation., Disp: , Rfl:  .  Cholecalciferol (VITAMIN D-3 PO), Take 1 tablet by mouth daily., Disp: , Rfl:  .  ibuprofen (ADVIL,MOTRIN) 200 MG tablet, Take 1 tablet (200 mg total) by mouth every 6 (six) hours as needed for moderate pain (for pain not controlled with tylenol.)., Disp: , Rfl:  .  loratadine (CLARITIN) 10 MG  tablet, Take 10 mg by mouth daily. Takes 5mg  daily, Disp: , Rfl:  .  Multiple Minerals-Vitamins (CALCIUM & VIT D3 BONE HEALTH PO), Take 1 tablet by mouth daily., Disp: , Rfl:  .  Multiple Vitamins-Minerals (MULTIVITAMIN WITH MINERALS) tablet, Take 1 tablet by mouth daily., Disp: , Rfl:  .  nitrofurantoin, macrocrystal-monohydrate, (MACROBID) 100 MG capsule, TAKE 1 CAPSULE BY MOUTH FOR 7 DAYS, Disp: , Rfl:  .  polyethylene glycol (MIRALAX / GLYCOLAX) packet, Take 17 g by mouth daily as needed for mild constipation. (Patient taking differently: Take 17 g by mouth daily. ), Disp: , Rfl:  .  rosuvastatin (CRESTOR) 10 MG tablet, Take 10 mg by mouth daily. , Disp: , Rfl:  .  SYNTHROID 112 MCG tablet, Take 112 mcg by mouth daily., Disp: , Rfl:  .  vitamin B-12 (CYANOCOBALAMIN) 1000 MCG tablet, Take 1,000 mcg by mouth daily., Disp: , Rfl:  .  vitamin E 1000 UNIT capsule, Take 1,000 Units by mouth daily., Disp: , Rfl:   Allergies  Allergen Reactions  . Codeine Camsylate [Codeine] Other (See Comments)    Caused severe headaches, dizziness and confusion  . Zithromax [Azithromycin] Nausea And Vomiting         Objective:  Physical Exam  General: AAO x3, NAD  Dermatological: Nails are hypertrophic, dystrophic, brittle,  discolored, elongated 10. No surrounding redness or drainage. Tenderness nails 1-5 bilaterally. No open lesions or pre-ulcerative lesions are identified today.  On the left leg associated the swelling is brown discoloration with slight red discoloration with there is no increase in warmth.  This appears to be more from venous insufficiency in the left side.  This is been a chronic issue.  Vascular: Dorsalis Pedis artery and Posterior Tibial artery pedal pulses are 2/4 bilateral with immedate capillary fill time.  Swelling of left leg which is chronic without any calf pain with compression.  Neruologic: Grossly intact via light touch bilateral. Vibratory intact via tuning fork bilateral.    Musculoskeletal: No gross boney pedal deformities bilateral. No pain, crepitus, or limitation noted with foot and ankle range of motion bilateral. Muscular strength 5/5 in all groups tested bilateral.     Assessment:   86 year old female with symptomatic onychomycosis, venous insufficiency left side     Plan:  -Treatment options discussed including all alternatives, risks, and complications -Etiology of symptoms were discussed -She is previous had a venous duplex which was negative.  This is been ongoing issue.  I would order venous reflux study.  I did measure her today for compression wraps as opposed to a stocking.  Continue to recommend elevation. -Debrided the nails x10 without any complications or bleeding    Trula Slade DPM

## 2019-12-17 ENCOUNTER — Telehealth (HOSPITAL_COMMUNITY): Payer: Self-pay

## 2019-12-17 NOTE — Telephone Encounter (Signed)

## 2019-12-18 ENCOUNTER — Inpatient Hospital Stay (HOSPITAL_COMMUNITY): Admission: RE | Admit: 2019-12-18 | Payer: Medicare Other | Source: Ambulatory Visit

## 2019-12-19 ENCOUNTER — Encounter (HOSPITAL_COMMUNITY): Payer: Medicare Other

## 2019-12-22 ENCOUNTER — Ambulatory Visit: Payer: Medicare Other | Attending: Internal Medicine

## 2019-12-22 DIAGNOSIS — Z23 Encounter for immunization: Secondary | ICD-10-CM | POA: Insufficient documentation

## 2019-12-22 NOTE — Progress Notes (Signed)
   Covid-19 Vaccination Clinic  Name:  Jackie [name]    MRN: [id number] DOB: 1932/03/28  12/22/2019  [name] was observed post Covid-19 immunization for 15 minutes without incidence. She was provided with Vaccine Information Sheet and instruction to access the V-Safe system.   [name] was instructed to call 911 with any severe reactions post vaccine: Marland Kitchen Difficulty breathing  . Swelling of your face and throat  . A fast heartbeat  . A bad rash all over your body  . Dizziness and weakness    Immunizations Administered    Name Date Dose VIS Date Route   Pfizer COVID-19 Vaccine 12/22/2019 12:35 PM 0.3 mL 11/08/2019 Intramuscular   Manufacturer: [place]   Lot: [id number]   [place]: [id number]

## 2020-01-14 ENCOUNTER — Ambulatory Visit: Payer: Self-pay

## 2020-01-17 ENCOUNTER — Ambulatory Visit: Payer: Self-pay

## 2020-01-19 ENCOUNTER — Ambulatory Visit: Payer: Medicare Other | Attending: Internal Medicine

## 2020-01-19 DIAGNOSIS — Z23 Encounter for immunization: Secondary | ICD-10-CM | POA: Insufficient documentation

## 2020-01-19 NOTE — Progress Notes (Signed)
   Covid-19 Vaccination Clinic  Name:  Jackie [name]    MRN: [id number] DOB: Jan 29, 1932  01/19/2020  [name] was observed post Covid-19 immunization for 15 minutes without incidence. She was provided with Vaccine Information Sheet and instruction to access the V-Safe system.   [name] was instructed to call 911 with any severe reactions post vaccine: Marland Kitchen Difficulty breathing  . Swelling of your face and throat  . A fast heartbeat  . A bad rash all over your body  . Dizziness and weakness    Immunizations Administered    Name Date Dose VIS Date Route   Pfizer COVID-19 Vaccine 01/19/2020  1:08 PM 0.3 mL 11/08/2019 Intramuscular   Manufacturer: [place]   Lot: [id number]   [place]: [id number]

## 2020-02-28 ENCOUNTER — Other Ambulatory Visit (HOSPITAL_COMMUNITY): Payer: Self-pay | Admitting: *Deleted

## 2020-03-02 ENCOUNTER — Ambulatory Visit (HOSPITAL_COMMUNITY)
Admission: RE | Admit: 2020-03-02 | Discharge: 2020-03-02 | Disposition: A | Discharge status: Home / Self Care | Payer: Medicare Other | Source: Ambulatory Visit | Attending: Endocrinology | Admitting: Endocrinology

## 2020-03-02 ENCOUNTER — Other Ambulatory Visit: Payer: Self-pay

## 2020-03-02 DIAGNOSIS — M810 Age-related osteoporosis without current pathological fracture: Secondary | ICD-10-CM | POA: Insufficient documentation

## 2020-03-02 MED ORDER — DENOSUMAB 60 MG/ML ~~LOC~~ SOSY
PREFILLED_SYRINGE | SUBCUTANEOUS | Status: AC
  Administered 2020-03-02: 60 mg via SUBCUTANEOUS
  Filled 2020-03-02: qty 1

## 2020-03-02 MED ORDER — DENOSUMAB 60 MG/ML ~~LOC~~ SOSY: 60.0000 mg | PREFILLED_SYRINGE | Freq: Once | SUBCUTANEOUS | Status: AC

## 2020-03-10 ENCOUNTER — Ambulatory Visit: Payer: Medicare Other | Admitting: Podiatry

## 2020-03-10 ENCOUNTER — Encounter: Payer: Self-pay | Admitting: Podiatry

## 2020-03-10 ENCOUNTER — Other Ambulatory Visit: Payer: Self-pay

## 2020-03-10 VITALS — Temp 96.1°F

## 2020-03-10 DIAGNOSIS — M79675 Pain in left toe(s): Secondary | ICD-10-CM

## 2020-03-10 DIAGNOSIS — M79674 Pain in right toe(s): Secondary | ICD-10-CM

## 2020-03-10 DIAGNOSIS — B351 Tinea unguium: Secondary | ICD-10-CM | POA: Diagnosis not present

## 2020-03-10 DIAGNOSIS — I872 Venous insufficiency (chronic) (peripheral): Secondary | ICD-10-CM | POA: Diagnosis not present

## 2020-03-10 NOTE — Progress Notes (Signed)
This patient returns to my office for at risk foot care.  This patient requires this care by a professional since this patient will be at risk due to having venous stasis  Legs  B/L.  This patient is unable to cut nails herself since the patient cannot reach her nails.These nails are painful walking and wearing shoes.  This patient presents for at risk foot care today.  General Appearance  Alert, conversant and in no acute stress.  Vascular  Dorsalis pedis and posterior tibial  pulses are palpable  bilaterally.  Capillary return is within normal limits  bilaterally. Temperature is within normal limits  bilaterally.  Neurologic  Senn-Weinstein monofilament wire test within normal limits  bilaterally. Muscle power within normal limits bilaterally.  Nails Thick disfigured discolored nails with subungual debris  from hallux to fifth toes bilaterally. No evidence of bacterial infection or drainage bilaterally.  Orthopedic  No limitations of motion  feet .  No crepitus or effusions noted.  No bony pathology or digital deformities noted.  Skin  normotropic skin with no porokeratosis noted bilaterally.  No signs of infections or ulcers noted.     Onychomycosis  Pain in right toes  Pain in left toes  Consent was obtained for treatment procedures.   Mechanical debridement of nails 1-5  bilaterally performed with a nail nipper.  Filed with dremel without incident.    Return office visit  3 months                    Told patient to return for periodic foot care and evaluation due to potential at risk complications.   Jewelene Mairena DPM  

## 2020-03-12 DIAGNOSIS — M20011 Mallet finger of right finger(s): Secondary | ICD-10-CM | POA: Insufficient documentation

## 2020-05-13 DIAGNOSIS — H4020X0 Unspecified primary angle-closure glaucoma, stage unspecified: Secondary | ICD-10-CM | POA: Insufficient documentation

## 2020-06-09 ENCOUNTER — Encounter: Payer: Self-pay | Admitting: Podiatry

## 2020-06-09 ENCOUNTER — Other Ambulatory Visit: Payer: Self-pay

## 2020-06-09 ENCOUNTER — Ambulatory Visit: Payer: Medicare Other | Admitting: Podiatry

## 2020-06-09 DIAGNOSIS — I872 Venous insufficiency (chronic) (peripheral): Secondary | ICD-10-CM | POA: Diagnosis not present

## 2020-06-09 DIAGNOSIS — M79675 Pain in left toe(s): Secondary | ICD-10-CM

## 2020-06-09 DIAGNOSIS — B351 Tinea unguium: Secondary | ICD-10-CM | POA: Diagnosis not present

## 2020-06-09 DIAGNOSIS — M79674 Pain in right toe(s): Secondary | ICD-10-CM

## 2020-06-09 NOTE — Progress Notes (Signed)
This patient returns to my office for at risk foot care.  This patient requires this care by a professional since this patient will be at risk due to having venous stasis  Legs  B/L.  This patient is unable to cut nails herself since the patient cannot reach her nails.These nails are painful walking and wearing shoes.  This patient presents for at risk foot care today.  General Appearance  Alert, conversant and in no acute stress.  Vascular  Dorsalis pedis and posterior tibial  pulses are palpable  bilaterally.  Capillary return is within normal limits  bilaterally. Temperature is within normal limits  bilaterally.  Neurologic  Senn-Weinstein monofilament wire test within normal limits  bilaterally. Muscle power within normal limits bilaterally.  Nails Thick disfigured discolored nails with subungual debris  from hallux to fifth toes bilaterally. No evidence of bacterial infection or drainage bilaterally.  Orthopedic  No limitations of motion  feet .  No crepitus or effusions noted.  No bony pathology or digital deformities noted.  Skin  normotropic skin with no porokeratosis noted bilaterally.  No signs of infections or ulcers noted.     Onychomycosis  Pain in right toes  Pain in left toes  Consent was obtained for treatment procedures.   Mechanical debridement of nails 1-5  bilaterally performed with a nail nipper.  Filed with dremel without incident.    Return office visit  3 months                    Told patient to return for periodic foot care and evaluation due to potential at risk complications.   Gardiner Barefoot DPM

## 2020-06-19 DIAGNOSIS — I87332 Chronic venous hypertension (idiopathic) with ulcer and inflammation of left lower extremity: Secondary | ICD-10-CM | POA: Insufficient documentation

## 2020-07-17 ENCOUNTER — Encounter (HOSPITAL_BASED_OUTPATIENT_CLINIC_OR_DEPARTMENT_OTHER): Payer: Medicare Other | Attending: Internal Medicine | Admitting: Internal Medicine

## 2020-07-17 ENCOUNTER — Other Ambulatory Visit: Payer: Self-pay

## 2020-07-17 DIAGNOSIS — I10 Essential (primary) hypertension: Secondary | ICD-10-CM | POA: Diagnosis not present

## 2020-07-17 DIAGNOSIS — I87332 Chronic venous hypertension (idiopathic) with ulcer and inflammation of left lower extremity: Secondary | ICD-10-CM | POA: Insufficient documentation

## 2020-07-17 DIAGNOSIS — L97321 Non-pressure chronic ulcer of left ankle limited to breakdown of skin: Secondary | ICD-10-CM | POA: Diagnosis not present

## 2020-07-24 ENCOUNTER — Encounter (HOSPITAL_BASED_OUTPATIENT_CLINIC_OR_DEPARTMENT_OTHER): Payer: Medicare Other | Admitting: Internal Medicine

## 2020-07-24 DIAGNOSIS — I87332 Chronic venous hypertension (idiopathic) with ulcer and inflammation of left lower extremity: Secondary | ICD-10-CM | POA: Diagnosis not present

## 2020-07-25 NOTE — Progress Notes (Signed)
Jackie [name], [name]. ([id number]) Visit Report for 07/17/2020 Allergy List Details Patient Name: Date of Service: Jackie [name] CE M. 07/17/2020 2:45 PM Medical Record Number: [id number] Patient Account Number: [id number] Date of Birth/Sex: Treating RN: 1932-07-02 (86 y.o. [name] Primary Care Sandford Diop: [name] Other Clinician: Referring Raoul Ciano: Treating Aaniya Sterba/Extender: [name] in Treatment: 0 Allergies Active Allergies codeine Zithromax Allergy Notes Electronic Signature(s) Signed: 07/24/2020 5:50:16 PM By: Carlene Coria RN Entered By: Carlene Coria on 07/17/2020 15:07:09 -------------------------------------------------------------------------------- Arrival Information Details Patient Name: Date of Service: Jackie [name], [name] 07/17/2020 2:45 PM Medical Record Number: [id number] Patient Account Number: [id number] Date of Birth/Sex: Treating RN: [birth year] (86 y.o. [name] Primary Care Brionne Mertz: [name] Other Clinician: Referring Tavionna Grout: Treating Cornell Bourbon/Extender: [name] in Treatment: 0 Visit Information Patient Arrived: Ambulatory Arrival Time: 14:56 Accompanied By: self Transfer Assistance: None Patient Identification Verified: Yes Secondary Verification Process Completed: Yes Patient Requires Transmission-Based Precautions: No Patient Has Alerts: No Electronic Signature(s) Signed: 07/24/2020 5:50:16 PM By: Carlene Coria RN Entered By: Carlene Coria on 07/17/2020 15:04:19 -------------------------------------------------------------------------------- Clinic Level of Care Assessment Details Patient Name: Date of Service: Jackie [name] CE M. 07/17/2020 2:45 PM Medical Record Number: [id number] Patient Account Number: [id number] Date of Birth/Sex: Treating RN: Sep 05, 1932 (86 y.o. [name] Primary Care Amaryah Mallen: [name] Other Clinician: Referring [name]: Treating  Maricela Kawahara/Extender: [name] in Treatment: 0 Clinic Level of Care Assessment Items TOOL 1 Quantity Score X- 1 0 Use when EandM and Procedure is performed on INITIAL visit ASSESSMENTS - Nursing Assessment / Reassessment X- 1 20 General Physical Exam (combine w/ comprehensive assessment (listed just below) when performed on new pt. evals) X- 1 25 Comprehensive Assessment (HX, ROS, Risk Assessments, Wounds Hx, etc.) ASSESSMENTS - Wound and Skin Assessment / Reassessment []  - 0 Dermatologic / Skin Assessment (not related to wound area) ASSESSMENTS - Ostomy and/or Continence Assessment and Care []  - 0 Incontinence Assessment and Management []  - 0 Ostomy Care Assessment and Management (repouching, etc.) PROCESS - Coordination of Care X - Simple Patient / Family Education for ongoing care 1 15 []  - 0 Complex (extensive) Patient / Family Education for ongoing care X- 1 10 Staff obtains Programmer, systems, Records, T Results / Process Orders est []  - 0 Staff telephones HHA, Nursing Homes / Clarify orders / etc []  - 0 Routine Transfer to another Facility (non-emergent condition) []  - 0 Routine Hospital Admission (non-emergent condition) X- 1 15 New Admissions / Biomedical engineer / Ordering NPWT Apligraf, etc. , []  - 0 Emergency Hospital Admission (emergent condition) PROCESS - Special Needs []  - 0 Pediatric / Minor Patient Management []  - 0 Isolation Patient Management []  - 0 Hearing / Language / Visual special needs []  - 0 Assessment of Community assistance (transportation, D/C planning, etc.) []  - 0 Additional assistance / Altered mentation []  - 0 Support Surface(s) Assessment (bed, cushion, seat, etc.) INTERVENTIONS - Miscellaneous []  - 0 External ear exam []  - 0 Patient Transfer (multiple staff / Civil Service fast streamer / Similar devices) []  - 0 Simple Staple / Suture removal (25 or less) []  - 0 Complex Staple / Suture removal (26 or more) []  -  0 Hypo/Hyperglycemic Management (do not check if billed separately) X- 1 15 Ankle / Brachial Index (ABI) - do not check if billed separately Has the patient been seen at the hospital within the last three years: Yes Total Score: 100 Level Of Care:  New/Established - Level 3 Electronic Signature(s) Signed: 07/17/2020 4:56:28 PM By: Kela Millin Entered By: Kela Millin on 07/17/2020 16:13:43 -------------------------------------------------------------------------------- Compression Therapy Details Patient Name: Date of Service: Jackie [name], [name] 07/17/2020 2:45 PM Medical Record Number: [id number] Patient Account Number: [id number] Date of Birth/Sex: Treating RN: 1932/01/14 (86 y.o. [name] Primary Care Jalissa [name]: [name] Other Clinician: Referring [name]: Treating [name]/Extender: [name] in Treatment: 0 Compression Therapy Performed for Wound Assessment: Wound #1 Left,Medial Lower Leg Performed By: Clinician Baruch Gouty, RN Compression Type: Three Layer Post Procedure Diagnosis Same as Pre-procedure Electronic Signature(s) Signed: 07/17/2020 4:56:28 PM By: Kela Millin Entered By: Kela Millin on 07/17/2020 16:13:58 -------------------------------------------------------------------------------- Encounter Discharge Information Details Patient Name: Date of Service: Jackie [name], [name] 07/17/2020 2:45 PM Medical Record Number: [id number] Patient Account Number: [id number] Date of Birth/Sex: Treating RN: June 19, 1932 (86 y.o. [name] Primary Care [name]: [name] Other Clinician: Referring Bridie Colquhoun: Treating Jaylynn Siefert/Extender: [name] in Treatment: 0 Encounter Discharge Information Items Discharge Condition: Stable Ambulatory Status: Ambulatory Discharge Destination: Home Transportation: Private Auto Accompanied By: self Schedule Follow-up Appointment:  Yes Clinical Summary of Care: Patient Declined Electronic Signature(s) Signed: 07/24/2020 5:50:16 PM By: Carlene Coria RN Entered By: Carlene Coria on 07/17/2020 16:28:58 -------------------------------------------------------------------------------- Lower Extremity Assessment Details Patient Name: Date of Service: Jackie [name], [name] 07/17/2020 2:45 PM Medical Record Number: [id number] Patient Account Number: [id number] Date of Birth/Sex: Treating RN: 08/16/1932 (86 y.o. [name] Primary Care Daichi Moris: [name] Other Clinician: Referring Tyera Hansley: Treating Nekayla Heider/Extender: [name] in Treatment: 0 Edema Assessment Assessed: [name]: No] [Right: No] E[Left: dema] [Right: :] Calf Left: Right: Point of Measurement: 38 cm From Medial Instep 37 cm cm Ankle Left: Right: Point of Measurement: 10 cm From Medial Instep 21 cm cm Vascular Assessment Blood Pressure: Brachial: [Left:160] Ankle: [Left:Dorsalis Pedis: 180 1.13] Electronic Signature(s) Signed: 07/24/2020 5:50:16 PM By: Carlene Coria RN Entered By: Carlene Coria on 07/17/2020 15:34:32 -------------------------------------------------------------------------------- Multi Wound Chart Details Patient Name: Date of Service: Jackie [name], [name] 07/17/2020 2:45 PM Medical Record Number: [id number] Patient Account Number: [id number] Date of Birth/Sex: Treating RN: Apr 22, 1932 (86 y.o. [name] Primary Care Jaece Ducharme: [name] Other Clinician: Referring Sheyla Zaffino: Treating Deeksha Cotrell/Extender: [name] in Treatment: 0 Vital Signs Height(in): 61 Pulse(bpm): 62 Weight(lbs): 130 Blood Pressure(mmHg): 160/63 Body Mass Index(BMI): 25 Temperature(F): 98.7 Respiratory Rate(breaths/min): 18 Photos: [1:No Photos Left, Medial Lower Leg] [N/A:N/A N/A] Wound Location: [1:Gradually Appeared] [N/A:N/A] Wounding Event: [1:Venous Leg Ulcer]  [N/A:N/A] Primary Etiology: [1:Hypertension] [N/A:N/A] Comorbid History: [1:05/28/2020] [N/A:N/A] Date Acquired: [1:0] [N/A:N/A] Weeks of Treatment: [1:Open] [N/A:N/A] Wound Status: [1:8x4.5x0.1] [N/A:N/A] Measurements L x W x D (cm) [1:28.274] [N/A:N/A] A (cm) : rea [1:2.827] [N/A:N/A] Volume (cm) : [1:Full Thickness Without Exposed] [N/A:N/A] Classification: [1:Support Structures Medium] [N/A:N/A] Exudate Amount: [1:Serosanguineous] [N/A:N/A] Exudate Type: [1:red, brown] [N/A:N/A] Exudate Color: [1:Medium (34-66%)] [N/A:N/A] Granulation Amount: [1:Pink] [N/A:N/A] Granulation Quality: [1:Medium (34-66%)] [N/A:N/A] Necrotic Amount: [1:Fat Layer (Subcutaneous Tissue): Yes N/A] Exposed Structures: [1:Fascia: No Tendon: No Muscle: No Joint: No Bone: No None] [N/A:N/A] Epithelialization: [1:Compression Therapy] [N/A:N/A] Procedures Performed: Treatment Notes Wound #1 (Left, Medial Lower Leg) [1:1. Cleanse With Wound Cleanser Soap and water 3. Primary Dressing Applied Hydrofera Blue 4. Secondary Dressing Dry Gauze 6. Support Layer Applied 3 layer compression wrap] Notes netting Electronic Signature(s) Signed: 07/17/2020 4:56:28 PM By: Kela Millin Signed: 07/20/2020 7:15:26 AM By: Linton Ham MD Entered  By: Linton Ham on 07/17/2020 16:51:19 -------------------------------------------------------------------------------- Multi-Disciplinary Care Plan Details Patient Name: Date of Service: Jackie [name] CE M. 07/17/2020 2:45 PM Medical Record Number: [id number] Patient Account Number: [id number] Date of Birth/Sex: Treating RN: Jul 19, 1932 (86 y.o. [name] Primary Care Lupita Rosales: [name] Other Clinician: Referring Everlene Cunning: Treating Chiquitta Matty/Extender: [name] in Treatment: 0 Active Inactive Venous Leg Ulcer Nursing Diagnoses: Knowledge deficit related to disease process and management Goals: Patient/caregiver will verbalize  understanding of disease process and disease management Date Initiated: 07/17/2020 Target Resolution Date: 08/21/2020 Goal Status: Active Interventions: Provide education on venous insufficiency Notes: Wound/Skin Impairment Nursing Diagnoses: Impaired tissue integrity Goals: Ulcer/skin breakdown will have a volume reduction of 30% by week 4 Date Initiated: 07/17/2020 Target Resolution Date: 08/21/2020 Goal Status: Active Interventions: Provide education on ulcer and skin care Notes: Electronic Signature(s) Signed: 07/17/2020 4:56:28 PM By: Kela Millin Entered By: Kela Millin on 07/17/2020 16:09:51 -------------------------------------------------------------------------------- Pain Assessment Details Patient Name: Date of Service: Jackie [name], [name] 07/17/2020 2:45 PM Medical Record Number: [id number] Patient Account Number: [id number] Date of Birth/Sex: Treating RN: 10/31/1932 (86 y.o. [name] Primary Care Wyatte Dames: [name] Other Clinician: Referring Travon Crochet: Treating Daneli Butkiewicz/Extender: [name] in Treatment: 0 Active Problems Location of Pain Severity and Description of Pain Patient Has Paino No Site Locations Pain Management and Medication Current Pain Management: Electronic Signature(s) Signed: 07/24/2020 5:50:16 PM By: Carlene Coria RN Entered By: Carlene Coria on 07/17/2020 15:40:47 -------------------------------------------------------------------------------- Patient/Caregiver Education Details Patient Name: Date of Service: Jackie [name], [place]. 8/20/2021andnbsp2:45 PM Medical Record Number: [id number] Patient Account Number: [id number] Date of Birth/Gender: Treating RN: 1931/11/29 (86 y.o. [name] Primary Care Physician: [name] Other Clinician: Referring Physician: Treating Physician/Extender: [name] in Treatment: 0 Education Assessment Education  Provided To: Patient Education Topics Provided Venous: Handouts: Controlling Swelling with Compression Stockings Methods: Explain/Verbal Responses: State content correctly Wound/Skin Impairment: Handouts: Caring for Your Ulcer Methods: Explain/Verbal Responses: State content correctly Electronic Signature(s) Signed: 07/17/2020 4:56:28 PM By: Kela Millin Entered By: Kela Millin on 07/17/2020 16:10:06 -------------------------------------------------------------------------------- Wound Assessment Details Patient Name: Date of Service: Jackie [name], [name] 07/17/2020 2:45 PM Medical Record Number: [id number] Patient Account Number: [id number] Date of Birth/Sex: Treating RN: 07/29/1932 (86 y.o. [name] Primary Care Kimberlyn Quiocho: [name] Other Clinician: Referring Azhane Eckart: Treating Farrell Pantaleo/Extender: [name] in Treatment: 0 Wound Status Wound Number: 1 Primary Etiology: Venous Leg Ulcer Wound Location: Left, Medial Lower Leg Wound Status: Open Wounding Event: Gradually Appeared Comorbid History: Hypertension Date Acquired: 05/28/2020 Weeks Of Treatment: 0 Clustered Wound: No Photos Photo Uploaded By: Mikeal Hawthorne on 07/20/2020 14:54:09 Wound Measurements Length: (cm) 8 Width: (cm) 4.5 Depth: (cm) 0.1 Area: (cm) 28.274 Volume: (cm) 2.827 % Reduction in Area: % Reduction in Volume: Epithelialization: None Tunneling: No Undermining: No Wound Description Classification: Full Thickness Without Exposed Support Structu Exudate Amount: Medium Exudate Type: Serosanguineous Exudate Color: red, brown res Foul Odor After Cleansing: No Slough/Fibrino Yes Wound Bed Granulation Amount: Medium (34-66%) Exposed Structure Granulation Quality: Pink Fascia Exposed: No Necrotic Amount: Medium (34-66%) Fat Layer (Subcutaneous Tissue) Exposed: Yes Necrotic Quality: Adherent Slough Tendon Exposed: No Muscle Exposed: No Joint  Exposed: No Bone Exposed: No Electronic Signature(s) Signed: 07/24/2020 5:50:16 PM By: Carlene Coria RN Entered By: Carlene Coria on 07/17/2020 15:39:15 -------------------------------------------------------------------------------- Vitals Details Patient Name: Date of Service: Jackie [name], [place]. 07/17/2020 2:45 PM Medical  Record Number: [id number] Patient Account Number: [id number] Date of Birth/Sex: Treating RN: October 25, 1932 (86 y.o. [name] Primary Care Jaine Estabrooks: [name] Other Clinician: Referring Dontai Pember: Treating Sharnelle Cappelli/Extender: [name] in Treatment: 0 Vital Signs Time Taken: 15:05 Temperature (F): 98.7 Height (in): 61 Pulse (bpm): 69 Source: Stated Respiratory Rate (breaths/min): 18 Weight (lbs): 130 Blood Pressure (mmHg): 160/63 Source: Stated Reference Range: 80 - 120 mg / dl Body Mass Index (BMI): 24.6 Electronic Signature(s) Signed: 07/24/2020 5:50:16 PM By: Carlene Coria RN Entered By: Carlene Coria on 07/17/2020 15:06:25

## 2020-07-25 NOTE — Progress Notes (Signed)
Jackie, [name] [place]. ([id number]) Visit Report for 07/17/2020 Abuse/Suicide Risk Screen Details Patient Name: Date of Service: Jackie [name] CE M. 07/17/2020 2:45 PM Medical Record Number: [id number] Patient Account Number: [id number] Date of Birth/Sex: Treating RN: [birth year] (86 y.o. Orvan [name] Primary Care Perez Dirico: Jackie [name] Other Clinician: Referring Sharnette Kitamura: Treating Peni Rupard/Extender: [name] in Treatment: 0 Abuse/Suicide Risk Screen Items Answer ABUSE RISK SCREEN: Has anyone close to you tried to hurt or harm you recentlyo No Do you feel uncomfortable with anyone in your familyo No Has anyone forced you do things that you didnt want to doo No Electronic Signature(s) Signed: 07/24/2020 5:50:16 PM By: Carlene Coria RN Entered By: Carlene Coria on 07/17/2020 15:13:38 -------------------------------------------------------------------------------- Activities of Daily Living Details Patient Name: Date of Service: Jackie [name] CE M. 07/17/2020 2:45 PM Medical Record Number: [id number] Patient Account Number: [id number] Date of Birth/Sex: Treating RN: 1931-12-10 (86 y.o. Orvan [name] Primary Care Zenon Leaf: Jackie [name] Other Clinician: Referring Sanita Estrada: Treating Hosey Burmester/Extender: [name] in Treatment: 0 Activities of Daily Living Items Answer Activities of Daily Living (Please select one for each item) Drive Automobile Completely Able T Medications ake Completely Able Use T elephone Completely Able Care for Appearance Completely Able Use T oilet Completely Able Bath / Shower Completely Able Dress Self Completely Able Feed Self Completely Able Walk Completely Able Get In / Out Bed Completely Able Housework Completely Able Prepare Meals Completely [place] for Self Completely Able Electronic Signature(s) Signed: 07/24/2020 5:50:16 PM By: Carlene Coria RN Entered By: Carlene Coria on 07/17/2020 15:14:05 -------------------------------------------------------------------------------- Education Screening Details Patient Name: Date of Service: Jackie [name], [name] 07/17/2020 2:45 PM Medical Record Number: [id number] Patient Account Number: [id number] Date of Birth/Sex: Treating RN: 1932/08/25 (86 y.o. Orvan [name] Primary Care Roxan Yamamoto: Jackie [name] Other Clinician: Referring Minha Fulco: Treating Chazlyn Cude/Extender: [name] in Treatment: 0 Primary Learner Assessed: Patient Learning Preferences/Education Level/Primary Language Learning Preference: Explanation Highest Education Level: High School Preferred Language: English Cognitive Barrier Language Barrier: No Translator Needed: No Memory Deficit: No Emotional Barrier: No Cultural/Religious Beliefs Affecting Medical Care: No Physical Barrier Impaired Vision: Yes Glasses Impaired Hearing: No Decreased Hand dexterity: No Knowledge/Comprehension Knowledge Level: Medium Comprehension Level: High Ability to understand written instructions: High Ability to understand verbal instructions: High Motivation Anxiety Level: Anxious Cooperation: Cooperative Education Importance: Acknowledges Need Interest in Health Problems: Asks Questions Perception: Coherent Willingness to Engage in Self-Management High Activities: Readiness to Engage in Self-Management High Activities: Electronic Signature(s) Signed: 07/24/2020 5:50:16 PM By: Carlene Coria RN Entered By: Carlene Coria on 07/17/2020 15:14:47 -------------------------------------------------------------------------------- Fall Risk Assessment Details Patient Name: Date of Service: Jackie [name], [name] 07/17/2020 2:45 PM Medical Record Number: [id number] Patient Account Number: [id number] Date of Birth/Sex: Treating RN: [birth year] (86 y.o. Orvan [name] Primary Care Elyana Grabski: Jackie [name] Other Clinician: Referring  Pallas Wahlert: Treating Lucrecia Mcphearson/Extender: [name] in Treatment: 0 Fall Risk Assessment Items Have you had 2 or more falls in the last 12 monthso 0 No Have you had any fall that resulted in injury in the last 12 monthso 0 No FALLS RISK SCREEN History of falling - immediate or within 3 months 0 No Secondary diagnosis (Do you have 2 or more medical diagnoseso) 0 No Ambulatory aid None/bed rest/wheelchair/nurse 0 No Crutches/cane/walker 0 No Furniture 0 No Intravenous therapy Access/Saline/Heparin Lock 0 No Gait/Transferring Normal/ bed rest/ wheelchair 0 No Weak (  short steps with or without shuffle, stooped but able to lift head while walking, may seek 0 No support from furniture) Impaired (short steps with shuffle, may have difficulty arising from chair, head down, impaired 0 No balance) Mental Status Oriented to own ability 0 No Electronic Signature(s) Signed: 07/24/2020 5:50:16 PM By: Carlene Coria RN Entered By: Carlene Coria on 07/17/2020 15:16:16 -------------------------------------------------------------------------------- Foot Assessment Details Patient Name: Date of Service: Jackie [name], [name] 07/17/2020 2:45 PM Medical Record Number: [id number] Patient Account Number: [id number] Date of Birth/Sex: Treating RN: 03/27/1932 (86 y.o. Orvan [name] Primary Care Trennon Torbeck: Jackie [name] Other Clinician: Referring Lovena Kluck: Treating Suzanna Zahn/Extender: [name] in Treatment: 0 Foot Assessment Items Site Locations + = Sensation present, - = Sensation absent, C = Callus, U = Ulcer R = Redness, W = Warmth, M = Maceration, PU = Pre-ulcerative lesion F = Fissure, S = Swelling, D = Dryness Assessment Right: Left: Other Deformity: No No Prior Foot Ulcer: No No Prior Amputation: No No Charcot Joint: No No Ambulatory Status: Ambulatory Without Help Gait: Steady Electronic Signature(s) Signed: 07/24/2020 5:50:16 PM By:  Carlene Coria RN Entered By: Carlene Coria on 07/17/2020 15:25:34 -------------------------------------------------------------------------------- Nutrition Risk Screening Details Patient Name: Date of Service: Jackie [name] CE M. 07/17/2020 2:45 PM Medical Record Number: [id number] Patient Account Number: [id number] Date of Birth/Sex: Treating RN: Apr 07, 1932 (86 y.o. Orvan [name] Primary Care Mckale Haffey: Jackie [name] Other Clinician: Referring Chevonne Bostrom: Treating Hannibal Skalla/Extender: [name] in Treatment: 0 Height (in): 61 Weight (lbs): 130 Body Mass Index (BMI): 24.6 Nutrition Risk Screening Items Score Screening NUTRITION RISK SCREEN: I have an illness or condition that made me change the kind and/or amount of food I eat 0 No I eat fewer than two meals per day 0 No I eat few fruits and vegetables, or milk products 0 No I have three or more drinks of beer, liquor or wine almost every day 0 No I have tooth or mouth problems that make it hard for me to eat 0 No I don't always have enough money to buy the food I need 0 No I eat alone most of the time 0 No I take three or more different prescribed or over-the-counter drugs a day 1 Yes Without wanting to, I have lost or gained 10 pounds in the last six months 0 No I am not always physically able to shop, cook and/or feed myself 0 No Nutrition Protocols Good Risk Protocol 0 No interventions needed Moderate Risk Protocol High Risk Proctocol Risk Level: Good Risk Score: 1 Electronic Signature(s) Signed: 07/24/2020 5:50:16 PM By: Carlene Coria RN Entered By: Carlene Coria on 07/17/2020 15:16:43

## 2020-07-25 NOTE — Progress Notes (Signed)
MORRISSA, SHEIN [place]. ([id number]) Visit Report for 07/17/2020 Chief Complaint Document Details Patient Name: Date of Service: Jackie [name] CE M. 07/17/2020 2:45 PM Medical Record Number: [id number] Patient Account Number: [id number] Date of Birth/Sex: Treating RN: 10-20-1932 (86 y.o. [name] Primary Care Provider: [name] Other Clinician: Referring Provider: Treating Provider/Extender: Milford Cage, Alexis Goodell in Treatment: 0 Information Obtained from: Patient Chief Complaint 07/17/2020; patient is here for review of a wound on her left medial ankle and lower leg Electronic Signature(s) Signed: 07/20/2020 7:15:26 AM By: Linton Ham MD Entered By: Linton Ham on 07/17/2020 16:52:09 -------------------------------------------------------------------------------- HPI Details Patient Name: Date of Service: Jackie [name], Jackie CE M. 07/17/2020 2:45 PM Medical Record Number: [id number] Patient Account Number: [id number] Date of Birth/Sex: Treating RN: [birth year] (86 y.o. [name] Primary Care Provider: [name] Other Clinician: Referring Provider: Treating Provider/Extender: [name] in Treatment: 0 History of Present Illness HPI Description: ADMISSION 07/17/2020 This is an 86 year old woman who is referred by her nurse practitioner at [facility] who has been doing treatment for the wound on the right medial lower leg and ankle. Apparently 1 point a substantial wound however it is largely epithelialized now. Patient states its been there for about 5 or 6 weeks. On the background she has changes in the skin in this area I think the go back several years to when she had a left hip fracture. Nevertheless she does not have a wound history that I could elicit. As far as I know that they had been using TCA, Mepitel under an Unna boot they have been changing this weekly at [facility] [employer] [facility] [employer]. She  also received a course of doxycycline. Past medical history very little that I can find in [place] [facility]. She has a history of venous insufficiency bilaterally and a left hip fracture hypertension and a history of thyroidectomy for thyroid cancer. ABI in our clinic was 1.13 on the left Electronic Signature(s) Signed: 07/20/2020 7:15:26 AM By: Linton Ham MD Entered By: Linton Ham on 07/17/2020 16:55:06 -------------------------------------------------------------------------------- Physical Exam Details Patient Name: Date of Service: Jackie [name], Jackie CE M. 07/17/2020 2:45 PM Medical Record Number: [id number] Patient Account Number: [id number] Date of Birth/Sex: Treating RN: October 20, 1932 (86 y.o. [name] Primary Care Provider: [name] Other Clinician: Referring Provider: Treating Provider/Extender: [name] in Treatment: 0 Constitutional Patient is hypertensive.. Pulse regular and within target range for patient.Marland Kitchen Respirations regular, non-labored and within target range.. Temperature is normal and within the target range for the patient.Marland Kitchen Appears in no distress. Cardiovascular Pedal pulses are palpable on the left. Minimal edema. Integumentary (Hair, Skin) Skin changes of chronic venous insufficiency on the right leg with hemosiderin deposition. On the left I think there is underlying chronic stasis dermatitis in this area.. Notes Wound exam; left medial lower leg. Under illumination most of this is epithelialized. However there are still open areas. Some of these areas have some small punctate hemorrhages which almost looks like compression might have been too tight. On the background superiorly there is some evidence of venous inflammation here I suspect this is most of the problem. There is no evidence of surrounding infection. Electronic Signature(s) Signed: 07/20/2020 7:15:26 AM By: Linton Ham MD Entered By: Linton Ham  on 07/17/2020 16:58:59 -------------------------------------------------------------------------------- Physician Orders Details Patient Name: Date of Service: Jackie [name], [place]. 07/17/2020 2:45 PM Medical Record Number: [id number] Patient Account Number: [id number] Date of Birth/Sex: Treating  RN: Dec 12, 1931 (86 y.o. [name] Primary Care Provider: [name] Other Clinician: Referring Provider: Treating Provider/Extender: [name] in Treatment: 0 Verbal / Phone Orders: No Diagnosis Coding Follow-up Appointments Return Appointment in 1 week. Dressing Change Frequency Do not change entire dressing for one week. Skin Barriers/Peri-Wound Care TCA Cream or Ointment - on wound bed as well Wound Cleansing May shower with protection. - cast protector Primary Wound Dressing Wound #1 Left,Medial Lower Leg Hydrofera Blue - classic Secondary Dressing ABD pad Edema Control 3 Layer Compression System - Left Lower Extremity Avoid standing for long periods of time Elevate legs to the level of the heart or above for 30 minutes daily and/or when sitting, a frequency of: Exercise regularly Electronic Signature(s) Signed: 07/17/2020 4:56:28 PM By: Kela Millin Signed: 07/20/2020 7:15:26 AM By: Linton Ham MD Entered By: Kela Millin on 07/17/2020 16:13:05 -------------------------------------------------------------------------------- Problem List Details Patient Name: Date of Service: Jackie [name], Jackie CE M. 07/17/2020 2:45 PM Medical Record Number: [id number] Patient Account Number: [id number] Date of Birth/Sex: Treating RN: [birth year] (86 y.o. [name] Primary Care Provider: [name] Other Clinician: Referring Provider: Treating Provider/Extender: [name] in Treatment: 0 Active Problems ICD-10 Encounter Code Description Active Date MDM Diagnosis I87.332 Chronic venous hypertension  (idiopathic) with ulcer and inflammation of left 07/17/2020 No Yes lower extremity L97.321 Non-pressure chronic ulcer of left ankle limited to breakdown of skin 07/17/2020 No Yes Inactive Problems Resolved Problems Electronic Signature(s) Signed: 07/20/2020 7:15:26 AM By: Linton Ham MD Entered By: Linton Ham on 07/17/2020 16:50:53 -------------------------------------------------------------------------------- Progress Note Details Patient Name: Date of Service: Jackie [name], Jackie CE M. 07/17/2020 2:45 PM Medical Record Number: [id number] Patient Account Number: [id number] Date of Birth/Sex: Treating RN: 05-Oct-1932 (86 y.o. [name] Primary Care Provider: [name] Other Clinician: Referring Provider: Treating Provider/Extender: Milford Cage, Alexis Goodell in Treatment: 0 Subjective Chief Complaint Information obtained from Patient 07/17/2020; patient is here for review of a wound on her left medial ankle and lower leg History of Present Illness (HPI) ADMISSION 07/17/2020 This is an 86 year old woman who is referred by her nurse practitioner at [facility] who has been doing treatment for the wound on the right medial lower leg and ankle. Apparently 1 point a substantial wound however it is largely epithelialized now. Patient states its been there for about 5 or 6 weeks. On the background she has changes in the skin in this area I think the go back several years to when she had a left hip fracture. Nevertheless she does not have a wound history that I could elicit. As far as I know that they had been using TCA, Mepitel under an Unna boot they have been changing this weekly at [facility] [employer] [facility] [employer]. She also received a course of doxycycline. Past medical history very little that I can find in [place] link. She has a history of venous insufficiency bilaterally and a left hip fracture hypertension and a history of thyroidectomy for  thyroid cancer. ABI in our clinic was 1.13 on the left Patient History Information obtained from Patient. Allergies codeine, Zithromax Family History Cancer - Siblings, Diabetes - Siblings, Heart Disease - Father, Hypertension - Mother,Father, Lung Disease - Siblings, Stroke - Mother, No family history of Hereditary Spherocytosis, Kidney Disease, Seizures, Thyroid Problems, Tuberculosis. Social History Never smoker, Marital Status - Widowed, Alcohol Use - Never, Drug Use - No History, Caffeine Use - Rarely. Medical History Eyes Denies history of  Cataracts, Glaucoma, Optic Neuritis Ear/Nose/Mouth/Throat Denies history of Chronic sinus problems/congestion, Middle ear problems Hematologic/Lymphatic Denies history of Anemia, Hemophilia, Human Immunodeficiency Virus, Lymphedema, Sickle Cell Disease Respiratory Denies history of Aspiration, Asthma, Chronic Obstructive Pulmonary Disease (COPD), Pneumothorax, Sleep Apnea, Tuberculosis Cardiovascular Patient has history of Hypertension Denies history of Angina, Arrhythmia, Congestive Heart Failure, Coronary Artery Disease, Deep Vein Thrombosis, Hypotension, Myocardial Infarction, Peripheral Arterial Disease, Peripheral Venous Disease, Phlebitis, Vasculitis Gastrointestinal Denies history of Cirrhosis , Colitis, Crohnoos, Hepatitis A, Hepatitis B, Hepatitis C Endocrine Denies history of Type I Diabetes, Type II Diabetes Genitourinary Denies history of End Stage Renal Disease Immunological Denies history of Lupus Erythematosus, Raynaudoos, Scleroderma Integumentary (Skin) Denies history of History of Burn Musculoskeletal Denies history of Gout, Rheumatoid Arthritis, Osteoarthritis, Osteomyelitis Neurologic Denies history of Dementia, Neuropathy, Quadriplegia, Paraplegia, Seizure Disorder Oncologic Denies history of Received Chemotherapy, Received Radiation Psychiatric Denies history of Anorexia/bulimia, Confinement Anxiety Review of  Systems (ROS) Constitutional Symptoms (General Health) Denies complaints or symptoms of Fatigue, Fever, Chills, Marked Weight Change. Eyes Complains or has symptoms of Glasses / Contacts. Denies complaints or symptoms of Dry Eyes, Vision Changes. Ear/Nose/Mouth/Throat Denies complaints or symptoms of Chronic sinus problems or rhinitis. Respiratory Denies complaints or symptoms of Chronic or frequent coughs, Shortness of Breath. Cardiovascular Denies complaints or symptoms of Chest pain. Gastrointestinal Denies complaints or symptoms of Frequent diarrhea, Nausea, Vomiting. Endocrine Denies complaints or symptoms of Heat/cold intolerance. Genitourinary Denies complaints or symptoms of Frequent urination. Integumentary (Skin) Complains or has symptoms of Wounds. Musculoskeletal Denies complaints or symptoms of Muscle Pain, Muscle Weakness. Neurologic Denies complaints or symptoms of Numbness/parasthesias. Psychiatric Denies complaints or symptoms of Claustrophobia, Suicidal. Objective Constitutional Patient is hypertensive.. Pulse regular and within target range for patient.Marland Kitchen Respirations regular, non-labored and within target range.. Temperature is normal and within the target range for the patient.Marland Kitchen Appears in no distress. Vitals Time Taken: 3:05 PM, Height: 61 in, Source: Stated, Weight: 130 lbs, Source: Stated, BMI: 24.6, Temperature: 98.7 F, Pulse: 69 bpm, Respiratory Rate: 18 breaths/min, Blood Pressure: 160/63 mmHg. Cardiovascular Pedal pulses are palpable on the left. Minimal edema. General Notes: Wound exam; left medial lower leg. Under illumination most of this is epithelialized. However there are still open areas. Some of these areas have some small punctate hemorrhages which almost looks like compression might have been too tight. On the background superiorly there is some evidence of venous inflammation here I suspect this is most of the problem. There is no evidence  of surrounding infection. Integumentary (Hair, Skin) Skin changes of chronic venous insufficiency on the right leg with hemosiderin deposition. On the left I think there is underlying chronic stasis dermatitis in this area.. Wound #1 status is Open. Original cause of wound was Gradually Appeared. The wound is located on the Left,Medial Lower Leg. The wound measures 8cm length x 4.5cm width x 0.1cm depth; 28.274cm^2 area and 2.827cm^3 volume. There is Fat Layer (Subcutaneous Tissue) Exposed exposed. There is no tunneling or undermining noted. There is a medium amount of serosanguineous drainage noted. There is medium (34-66%) pink granulation within the wound bed. There is a medium (34-66%) amount of necrotic tissue within the wound bed including Adherent Slough. Assessment Active Problems ICD-10 Chronic venous hypertension (idiopathic) with ulcer and inflammation of left lower extremity Non-pressure chronic ulcer of left ankle limited to breakdown of skin Procedures Wound #1 Pre-procedure diagnosis of Wound #1 is a Venous Leg Ulcer located on the Left,Medial Lower Leg . There was a Three Layer Compression Therapy Procedure  by Baruch Gouty, RN. Post procedure Diagnosis Wound #1: Same as Pre-Procedure Plan Follow-up Appointments: Return Appointment in 1 week. Dressing Change Frequency: Do not change entire dressing for one week. Skin Barriers/Peri-Wound Care: TCA Cream or Ointment - on wound bed as well Wound Cleansing: May shower with protection. - cast protector Primary Wound Dressing: Wound #1 Left,Medial Lower Leg: Hydrofera Blue - classic Secondary Dressing: ABD pad Edema Control: 3 Layer Compression System - Left Lower Extremity Avoid standing for long periods of time Elevate legs to the level of the heart or above for 30 minutes daily and/or when sitting, a frequency of: Exercise regularly 1. TCA over the entire wound 2. Hydrofera Blue/ABDs under 3 layer compression 3.  I think the area is still suffering from problems with inflammation which the patient describes if you listen to what she was saying before the wound actually open. 4. This is a patient that if she has recurrent wounds might require venous reflux studies specifically looking at the greater saphenous vein on the left side 5. I suspect this will heal over fairly quickly. I am not sure what she has in terms of compression stockings however. I spent 30 minutes in review of this patient's past medical history, face-to-face evaluation and preparation of this record Electronic Signature(s) Signed: 07/20/2020 7:15:26 AM By: Linton Ham MD Entered By: Linton Ham on 07/17/2020 17:00:32 -------------------------------------------------------------------------------- HxROS Details Patient Name: Date of Service: Jackie [name], Jackie CE M. 07/17/2020 2:45 PM Medical Record Number: [id number] Patient Account Number: [id number] Date of Birth/Sex: Treating RN: 11/08/1932 (86 y.o. [name] Primary Care Provider: [name] Other Clinician: Referring Provider: Treating Provider/Extender: [name] in Treatment: 0 Information Obtained From Patient Constitutional Symptoms (General Health) Complaints and Symptoms: Negative for: Fatigue; Fever; Chills; Marked Weight Change Eyes Complaints and Symptoms: Positive for: Glasses / Contacts Negative for: Dry Eyes; Vision Changes Medical History: Negative for: Cataracts; Glaucoma; Optic Neuritis Ear/Nose/Mouth/Throat Complaints and Symptoms: Negative for: Chronic sinus problems or rhinitis Medical History: Negative for: Chronic sinus problems/congestion; Middle ear problems Respiratory Complaints and Symptoms: Negative for: Chronic or frequent coughs; Shortness of Breath Medical History: Negative for: Aspiration; Asthma; Chronic Obstructive Pulmonary Disease (COPD); Pneumothorax; Sleep Apnea;  Tuberculosis Cardiovascular Complaints and Symptoms: Negative for: Chest pain Medical History: Positive for: Hypertension Negative for: Angina; Arrhythmia; Congestive Heart Failure; Coronary Artery Disease; Deep Vein Thrombosis; Hypotension; Myocardial Infarction; Peripheral Arterial Disease; Peripheral Venous Disease; Phlebitis; Vasculitis Gastrointestinal Complaints and Symptoms: Negative for: Frequent diarrhea; Nausea; Vomiting Medical History: Negative for: Cirrhosis ; Colitis; Crohns; Hepatitis A; Hepatitis B; Hepatitis C Endocrine Complaints and Symptoms: Negative for: Heat/cold intolerance Medical History: Negative for: Type I Diabetes; Type II Diabetes Genitourinary Complaints and Symptoms: Negative for: Frequent urination Medical History: Negative for: End Stage Renal Disease Integumentary (Skin) Complaints and Symptoms: Positive for: Wounds Medical History: Negative for: History of Burn Musculoskeletal Complaints and Symptoms: Negative for: Muscle Pain; Muscle Weakness Medical History: Negative for: Gout; Rheumatoid Arthritis; Osteoarthritis; Osteomyelitis Neurologic Complaints and Symptoms: Negative for: Numbness/parasthesias Medical History: Negative for: Dementia; Neuropathy; Quadriplegia; Paraplegia; Seizure Disorder Psychiatric Complaints and Symptoms: Negative for: Claustrophobia; Suicidal Medical History: Negative for: Anorexia/bulimia; Confinement Anxiety Hematologic/Lymphatic Medical History: Negative for: Anemia; Hemophilia; Human Immunodeficiency Virus; Lymphedema; Sickle Cell Disease Immunological Medical History: Negative for: Lupus Erythematosus; Raynauds; Scleroderma Oncologic Medical History: Negative for: Received Chemotherapy; Received Radiation Immunizations Pneumococcal Vaccine: Received Pneumococcal Vaccination: No Implantable Devices None Family and Social History Cancer: Yes - Siblings; Diabetes: Yes - Siblings; Heart Disease:  Yes - Father; Hereditary Spherocytosis: No; Hypertension: Yes - Mother,Father; Kidney Disease: No; Lung Disease: Yes - Siblings; Seizures: No; Stroke: Yes - Mother; Thyroid Problems: No; Tuberculosis: No; Never smoker; Marital Status - Widowed; Alcohol Use: Never; Drug Use: No History; Caffeine Use: Rarely; Financial Concerns: No; Food, Clothing or Shelter Needs: No; Support System Lacking: No; Transportation Concerns: No Electronic Signature(s) Signed: 07/20/2020 7:15:26 AM By: Linton Ham MD Signed: 07/24/2020 5:50:16 PM By: Carlene Coria RN Entered By: Carlene Coria on 07/17/2020 15:13:29 -------------------------------------------------------------------------------- SuperBill Details Patient Name: Date of Service: Jackie [name], Jackie CE M. 07/17/2020 Medical Record Number: [id number] Patient Account Number: [id number] Date of Birth/Sex: Treating RN: Mar 30, 1932 (86 y.o. [name] Primary Care Provider: [name] Other Clinician: Referring Provider: Treating Provider/Extender: [name] in Treatment: 0 Diagnosis Coding ICD-10 Codes Code Description [phone] Chronic venous hypertension (idiopathic) with ulcer and inflammation of left lower extremity L97.321 Non-pressure chronic ulcer of left ankle limited to breakdown of skin Facility Procedures CPT4 Code: [id number] Description: [facility] VISIT-LEV 3 EST PT Modifier: 25 Quantity: 1 CPT4 Code: [id number] Description: (Facility Use Only) [id number] - [place] [facility] LWR LT LEG Modifier: Quantity: 1 Physician Procedures : CPT4 Code Description Modifier [id number] WC PHYS LEVEL 3 NEW PT ICD-10 Diagnosis Description I87.332 Chronic venous hypertension (idiopathic) with ulcer and inflammation of left lower extremity L97.321 Non-pressure chronic ulcer of left ankle limited to  breakdown of skin Quantity: 1 Electronic Signature(s) Signed: 07/20/2020 7:15:26 AM By: Linton Ham MD Previous  Signature: 07/17/2020 4:56:28 PM Version By: Kela Millin Entered By: Linton Ham on 07/17/2020 17:00:56

## 2020-07-26 NOTE — Progress Notes (Signed)
BERDIE, MALTER Beat[place]. ([id number]) Visit Report for 07/24/2020 HPI Details Patient Name: Date of Service: Jackie [name] CE M. 07/24/2020 2:15 PM Medical Record Number: [id number] Patient Account Number: [id number] Date of Birth/Sex: Treating RN: 09-04-1932 (86 y.o. [name] Primary Care Provider: [name] Other Clinician: Referring Provider: Treating Provider/Extender: [name] in Treatment: 1 History of Present Illness HPI Description: ADMISSION 07/17/2020 This is an 86 year old woman who is referred by her nurse practitioner at [facility] who has been doing treatment for the wound on the right medial lower leg and ankle. Apparently 1 point a substantial wound however it is largely epithelialized now. Patient states its been there for about 5 or 6 weeks. On the background she has changes in the skin in this area I think the go back several years to when she had a left hip fracture. Nevertheless she does not have a wound history that I could elicit. As far as I know that they had been using TCA, Mepitel under an Unna boot they have been changing this weekly at [employer]. She also received a course of doxycycline. Past medical history very little that I can find in [place] link. She has a history of venous insufficiency bilaterally and a left hip fracture hypertension and a history of thyroidectomy for thyroid cancer. ABI in our clinic was 1.13 on the left 8/27; the patient is substantial initial wound by her description on the left medial lower leg and ankle is completely epithelialized. She has chronic venous insufficiency, dermatosclerosis and probably lymphedema. She does not have stockings and states that she would have trouble getting over the toe stockings on in any fashion because of hip problems except Electronic Signature(s) Signed: 07/26/2020 7:39:18 AM By: Linton Ham MD Entered By: Linton Ham  on 07/26/2020 06:09:14 -------------------------------------------------------------------------------- Physical Exam Details Patient Name: Date of Service: [name], [place]. 07/24/2020 2:15 PM Medical Record Number: [id number] Patient Account Number: [id number] Date of Birth/Sex: Treating RN: [birth year] (86 y.o. [name] Primary Care Provider: [name] Other Clinician: Referring Provider: Treating Provider/Extender: [name] in Treatment: 1 Constitutional Patient is hypertensive.. Pulse regular and within target range for patient.Marland Kitchen Respirations regular, non-labored and within target range.. Temperature is normal and within the target range for the patient.Marland Kitchen Appears in no distress. Respiratory work of breathing is normal. Cardiovascular pedal pulses are palpable. Integumentary (Hair, Skin) substantial area of the left lower leg and ankle medially significantly damaged skin. Scaly fibrosis but nontender. Notes wound exam; left medial lower leg and ankle. Under illumination there is only tiny areas of this reasonably substantial area that are not fully epithelialized. There is absolutely no elasticity in the area suggesting she has dermatosclerosis. Some edema in the upper lower leg suggest lymphedema. Electronic Signature(s) Signed: 07/26/2020 7:39:18 AM By: Linton Ham MD Entered By: Linton Ham on 07/26/2020 06:13:00 -------------------------------------------------------------------------------- Physician Orders Details Patient Name: Date of Service: [name], [place]. 07/24/2020 2:15 PM Medical Record Number: [id number] Patient Account Number: [id number] Date of Birth/Sex: Treating RN: 1931-12-27 (86 y.o. [name] Primary Care Provider: [name] Other Clinician: Referring Provider: Treating Provider/Extender: [name] in Treatment: 1 Verbal / Phone Orders: No Diagnosis  Coding ICD-10 Coding Code Description [phone] Chronic venous hypertension (idiopathic) with ulcer and inflammation of left lower extremity L97.321 Non-pressure chronic ulcer of left ankle limited to breakdown of skin Follow-up Appointments Wound #1 Left,Medial Lower Leg  ppointment in 1 week. - can do another day is no openings on Friday Return A Dressing Change Frequency Do not change entire dressing for one week. Skin Barriers/Peri-Wound Care TCA Cream or Ointment Edema Control 3 Layer Compression System - Left Lower Extremity Other: - Will order Juxtalites for both legs Electronic Signature(s) Signed: 07/24/2020 5:56:33 PM By: Kela Millin Signed: 07/26/2020 7:39:18 AM By: Linton Ham MD Entered By: Kela Millin on 07/24/2020 15:48:20 -------------------------------------------------------------------------------- Problem List Details Patient Name: Date of Service: [name], [name] 07/24/2020 2:15 PM Medical Record Number: [id number] Patient Account Number: [id number] Date of Birth/Sex: Treating RN: [birth year] (86 y.o. 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 07/24/2020 2:15 PM Medical Record Number: [id number] Patient Account Number: [id number] Date of Birth/Sex: Treating RN: 1932-01-26 (86 y.o. [name] Primary Care Provider: [name] Other Clinician: Referring Provider: Treating Provider/Extender: [name] in Treatment: 1 Subjective History of Present Illness (HPI) ADMISSION 07/17/2020 This is an 86 year old woman who is referred by her nurse practitioner at [facility] who has been doing treatment for the wound on the right medial lower leg and ankle. Apparently 1 point a substantial wound however it is largely epithelialized now. Patient states its been there for about 5 or 6 weeks. On the background she has changes in the skin in this area I think the go back several years to when she had a left hip fracture. Nevertheless she does not have a wound history that I could elicit. As far as I know that they had been using TCA, Mepitel under an Unna boot they have been changing this weekly at [employer] [facility] [employer]. She also received a course of doxycycline. Past medical history very little that I can find in [place] link. She has a history of venous insufficiency bilaterally and a left hip fracture hypertension and a history of thyroidectomy for thyroid cancer. ABI in our clinic was 1.13 on the left 8/27; the patient is substantial initial wound by her description on the left medial lower leg and ankle is completely epithelialized. She has chronic venous insufficiency, dermatosclerosis and probably lymphedema. She does not have stockings and states that she would have trouble getting over the toe stockings on in any fashion because of hip problems except Objective Constitutional Patient is hypertensive.. Pulse regular and within target range for patient.Marland Kitchen Respirations regular, non-labored and within target range.. Temperature is normal and within the target range for the  patient.Marland Kitchen Appears in no distress. Vitals Time Taken: 3:12 PM, Height: 61 in, Weight: 130 lbs, BMI: 24.6, Temperature: 98.1 F, Pulse: 66 bpm, Respiratory Rate: 18 breaths/min, Blood Pressure: 152/74 mmHg. Respiratory work of breathing is normal. Cardiovascular pedal pulses are palpable. General Notes: wound exam; left medial lower leg and ankle. Under illumination there is only tiny areas of this reasonably substantial area that are not fully epithelialized. There is absolutely no  elasticity in the area suggesting she has dermatosclerosis. Some edema in the upper lower leg suggest lymphedema. Integumentary (Hair, Skin) substantial area of the left lower leg and ankle medially significantly damaged skin. Scaly fibrosis but nontender. Wound #1 status is Open. Original cause of wound was Gradually Appeared. The wound is located on the Left,Medial Lower Leg. The wound measures 0.1cm length x 0.1cm width x 0.1cm depth; 0.008cm^2 area and 0.001cm^3 volume. There is no tunneling or undermining noted. There is a small amount of serous drainage noted. The wound margin is indistinct and nonvisible. There is small (1-33%) pink granulation within the wound bed. There is no necrotic tissue within the wound bed. Assessment Active Problems ICD-10 Chronic venous hypertension (idiopathic) with ulcer and inflammation of left lower extremity Non-pressure chronic ulcer of left ankle limited to breakdown of skin Procedures Wound #1 Pre-procedure diagnosis of Wound #1 is a Venous Leg Ulcer located on the Left,Medial Lower Leg . There was a Three Layer Compression Therapy Procedure by Baruch Gouty, RN. Post procedure Diagnosis Wound #1: Same as Pre-Procedure Plan Follow-up Appointments: Wound #1 Left,Medial Lower Leg: Return Appointment in 1 week. - can do another day is no openings on Friday Dressing Change Frequency: Do not change entire dressing for one week. Skin Barriers/Peri-Wound Care: TCA Cream  or Ointment Edema Control: 3 Layer Compression System - Left Lower Extremity Other: - Will order Juxtalites for both legs #1 TCA 3 layer compression should close this out completely for next week #2 we have ordered her external compression garmentsbilaterally and we will see her back next week to go over this with her Electronic Signature(s) Signed: 07/26/2020 7:39:18 AM By: Linton Ham MD Entered By: Linton Ham on 07/26/2020 06:15:11 -------------------------------------------------------------------------------- SuperBill Details Patient Name: Date of Service: [name], [name] 07/24/2020 Medical Record Number: [id number] Patient Account Number: [id number] Date of Birth/Sex: Treating RN: November 29, 1931 (86 y.o. [name] Primary Care Provider: [name] Other Clinician: Referring Provider: Treating Provider/Extender: [name] in Treatment: 1 Diagnosis Coding ICD-10 Codes Code Description [phone] Chronic venous hypertension (idiopathic) with ulcer and inflammation of left lower extremity L97.321 Non-pressure chronic ulcer of left ankle limited to breakdown of skin Facility Procedures CPT4 Code: [id number] Description: (Facility Use Only) [phone] - [place] LWR LT LEG Modifier: Quantity: 1 Physician Procedures : CPT4 Code Description Modifier [id number] - WC PHYS LEVEL 3 - EST PT ICD-10 Diagnosis Description I87.332 Chronic venous hypertension (idiopathic) with ulcer and inflammation of left lower extremity L97.321 Non-pressure chronic ulcer of left ankle  limited to breakdown of skin Quantity: 1 Electronic Signature(s) Signed: 07/26/2020 7:39:18 AM By: Linton Ham MD Previous Signature: 07/24/2020 5:56:33 PM Version By: Kela Millin Entered By: Linton Ham on 07/26/2020 06:16:38

## 2020-07-29 NOTE — Progress Notes (Signed)
CARMIE, LANPHER [place]. ([id number]) Visit Report for 07/24/2020 Arrival Information Details Patient Name: Date of Service: Jackie [name] CE M. 07/24/2020 2:15 PM Medical Record Number: [id number] Patient Account Number: [id number] Date of Birth/Sex: Treating RN: May 18, 1932 (86 y.o. Jackie [name] Primary Care Hasna Stefanik: [name] Other Clinician: Referring Zea Kostka: Treating Jakeira Seeman/Extender: [name] in Treatment: 1 Visit Information History Since Last Visit Added or deleted any medications: No Patient Arrived: Jackie [name] Any new allergies or adverse reactions: No Arrival Time: 15:12 Had a fall or experienced change in No Accompanied By: self activities of daily living that may affect Transfer Assistance: None risk of falls: Patient Identification Verified: Yes Signs or symptoms of abuse/neglect since last visito No Secondary Verification Process Completed: Yes Hospitalized since last visit: No Patient Requires Transmission-Based Precautions: No Implantable device outside of the clinic excluding No Patient Has Alerts: No cellular tissue based products placed in the center since last visit: Has Dressing in Place as Prescribed: Yes Pain Present Now: No Electronic Signature(s) Signed: 07/29/2020 11:00:36 AM By: Sandre Kitty Entered By: Sandre Kitty on 07/24/2020 15:12:23 -------------------------------------------------------------------------------- Compression Therapy Details Patient Name: Date of Service: Jackie [name], [place]. 07/24/2020 2:15 PM Medical Record Number: [id number] Patient Account Number: [id number] Date of Birth/Sex: Treating RN: 1932/07/25 (86 y.o. Jackie [name] Primary Care Bodi Palmeri: [name] Other Clinician: Referring Aubrey Voong: Treating Cohl Behrens/Extender: [name] in Treatment: 1 Compression Therapy Performed for Wound Assessment: Wound #1 Left,Medial Lower Leg Performed By: Clinician  Baruch Gouty, RN Compression Type: Three Layer Post Procedure Diagnosis Same as Pre-procedure Electronic Signature(s) Signed: 07/24/2020 5:56:33 PM By: Kela Millin Entered By: Kela Millin on 07/24/2020 15:50:40 -------------------------------------------------------------------------------- Encounter Discharge Information Details Patient Name: Date of Service: Jackie [name], [name] 07/24/2020 2:15 PM Medical Record Number: [id number] Patient Account Number: [id number] Date of Birth/Sex: Treating RN: Aug 15, 1932 (86 y.o. Jackie [name] Primary Care Austyn Perriello: [name] Other Clinician: Referring Jeniya Flannigan: Treating Kiffany Schelling/Extender: [name] in Treatment: 1 Encounter Discharge Information Items Discharge Condition: Stable Ambulatory Status: Cane Discharge Destination: Home Transportation: Private Auto Accompanied By: alone Schedule Follow-up Appointment: Yes Clinical Summary of Care: Patient Declined Electronic Signature(s) Signed: 07/24/2020 6:01:16 PM By: Levan Hurst RN, BSN Entered By: Levan Hurst on 07/24/2020 16:40:33 -------------------------------------------------------------------------------- Lower Extremity Assessment Details Patient Name: Date of Service: Jackie [name], [place]. 07/24/2020 2:15 PM Medical Record Number: [id number] Patient Account Number: [id number] Date of Birth/Sex: Treating RN: 09/27/1932 (86 y.o. Jackie [name] Primary Care Dalayla Aldredge: [name] Other Clinician: Referring Jaylea Plourde: Treating Erich Kochan/Extender: [name] in Treatment: 1 Edema Assessment Assessed: Jackie [name]: No] [name]: No] Edema: [Left: Ye] [Right: s] Calf Left: Right: Point of Measurement: 38 cm From Medial Instep 35 cm 35.5 cm Ankle Left: Right: Point of Measurement: 13 cm From Medial Instep 20.5 cm 21 cm Vascular Assessment Pulses: Dorsalis Pedis Palpable: [Left:Yes] Electronic  Signature(s) Signed: 07/24/2020 5:56:33 PM By: Kela Millin Signed: 07/24/2020 6:01:16 PM By: Levan Hurst RN, BSN Entered By: Kela Millin on 07/24/2020 15:50:09 -------------------------------------------------------------------------------- Multi Wound Chart Details Patient Name: Date of Service: Jackie [name], [place]. 07/24/2020 2:15 PM Medical Record Number: [id number] Patient Account Number: [id number] Date of Birth/Sex: Treating RN: Jul 04, 1932 (86 y.o. Jackie [name] Primary Care Farhana Fellows: [name] Other Clinician: Referring Simonne Boulos: Treating Vi Whitesel/Extender: [name] in Treatment: 1 Vital Signs Height(in): 61 Pulse(bpm): 66 Weight(lbs): 130 Blood Pressure(mmHg): 152/74 Body Mass Index(BMI): 25 Temperature(F): 98.1  Respiratory Rate(breaths/min): 18 Photos: [1:No Photos Left, Medial Lower Leg] [N/A:N/A N/A] Wound Location: [1:Gradually Appeared] [N/A:N/A] Wounding Event: [1:Venous Leg Ulcer] [N/A:N/A] Primary Etiology: [1:Hypertension] [N/A:N/A] Comorbid History: [1:05/28/2020] [N/A:N/A] Date Acquired: [1:1] [N/A:N/A] Weeks of Treatment: [1:Open] [N/A:N/A] Wound Status: [1:0.1x0.1x0.1] [N/A:N/A] Measurements L x W x D (cm) [1:0.008] [N/A:N/A] A (cm) : rea [1:0.001] [N/A:N/A] Volume (cm) : [1:100.00%] [N/A:N/A] % Reduction in A rea: [1:100.00%] [N/A:N/A] % Reduction in Volume: [1:Full Thickness Without Exposed] [N/A:N/A] Classification: [1:Support Structures Small] [N/A:N/A] Exudate Amount: [1:Serous] [N/A:N/A] Exudate Type: [1:amber] [N/A:N/A] Exudate Color: [1:Indistinct, nonvisible] [N/A:N/A] Wound Margin: [1:Small (1-33%)] [N/A:N/A] Granulation Amount: [1:Pink] [N/A:N/A] Granulation Quality: [1:None Present (0%)] [N/A:N/A] Necrotic Amount: [1:Fascia: No] [N/A:N/A] Exposed Structures: [1:Fat Layer (Subcutaneous Tissue): No Tendon: No Muscle: No Joint: No Bone: No Large (67-100%)] [N/A:N/A] Epithelialization:  [1:Compression Therapy] [N/A:N/A] Treatment Notes Wound #1 (Left, Medial Lower Leg) 1. Cleanse With Soap and water 2. Periwound Care TCA Cream 6. Support Layer Applied 3 layer compression wrap Notes netting Electronic Signature(s) Signed: 07/26/2020 7:39:18 AM By: Linton Ham MD Signed: 07/27/2020 4:26:23 PM By: Kela Millin Entered By: Linton Ham on 07/26/2020 06:06:44 -------------------------------------------------------------------------------- Multi-Disciplinary Care Plan Details Patient Name: Date of Service: Jackie [name], [name] 07/24/2020 2:15 PM Medical Record Number: [id number] Patient Account Number: [id number] Date of Birth/Sex: Treating RN: [birth year] (86 y.o. Jackie [name] Primary Care Maryela Tapper: [name] Other Clinician: Referring Zarea Diesing: Treating Corie Vavra/Extender: [name] in Treatment: 1 Active Inactive Venous Leg Ulcer Nursing Diagnoses: Knowledge deficit related to disease process and management Goals: Patient/caregiver will verbalize understanding of disease process and disease management Date Initiated: 07/17/2020 Target Resolution Date: 08/21/2020 Goal Status: Active Interventions: Provide education on venous insufficiency Notes: Wound/Skin Impairment Nursing Diagnoses: Impaired tissue integrity Goals: Ulcer/skin breakdown will have a volume reduction of 30% by week 4 Date Initiated: 07/17/2020 Target Resolution Date: 08/21/2020 Goal Status: Active Interventions: Provide education on ulcer and skin care Notes: Electronic Signature(s) Signed: 07/24/2020 5:56:33 PM By: Kela Millin Entered By: Kela Millin on 07/24/2020 15:14:55 -------------------------------------------------------------------------------- Pain Assessment Details Patient Name: Date of Service: Jackie [name], [place] M. 07/24/2020 2:15 PM Medical Record Number: [id number] Patient Account Number: [id number] Date of  Birth/Sex: Treating RN: 11/29/1931 (86 y.o. Jackie [name] Primary Care Milla Wahlberg: [name] Other Clinician: Referring Hosam Mcfetridge: Treating Daley Mooradian/Extender: [name] in Treatment: 1 Active Problems Location of Pain Severity and Description of Pain Patient Has Paino No Site Locations Pain Management and Medication Current Pain Management: Electronic Signature(s) Signed: 07/24/2020 5:56:33 PM By: Kela Millin Signed: 07/29/2020 11:00:36 AM By: Sandre Kitty Entered By: Sandre Kitty on 07/24/2020 15:12:46 -------------------------------------------------------------------------------- Patient/Caregiver Education Details Patient Name: Date of Service: Jackie [name], [place]. 8/27/2021andnbsp2:15 PM Medical Record Number: [id number] Patient Account Number: [id number] Date of Birth/Gender: Treating RN: [birth year] (86 y.o. Jackie [name] Primary Care Physician: [name] Other Clinician: Referring Physician: Treating Physician/Extender: [name] in Treatment: 1 Education Assessment Education Provided To: Patient Education Topics Provided Venous: Handouts: Controlling Swelling with Multilayered Compression Wraps Methods: Explain/Verbal Responses: State content correctly Wound/Skin Impairment: Handouts: Caring for Your Ulcer Methods: Explain/Verbal Responses: State content correctly Electronic Signature(s) Signed: 07/24/2020 5:56:33 PM By: Kela Millin Entered By: Kela Millin on 07/24/2020 15:15:24 -------------------------------------------------------------------------------- Wound Assessment Details Patient Name: Date of Service: Jackie [name], [place]. 07/24/2020 2:15 PM Medical Record Number: [id number] Patient Account Number: [id number] Date of Birth/Sex: Treating RN: [birth year] (86 y.o. Jackie [name] Primary Care Tagen Brethauer: [place],  Annie Main Other Clinician: Referring  Kerianna Rawlinson: Treating Colisha Redler/Extender: [name] in Treatment: 1 Wound Status Wound Number: 1 Primary Etiology: Venous Leg Ulcer Wound Location: Left, Medial Lower Leg Wound Status: Open Wounding Event: Gradually Appeared Comorbid History: Hypertension Date Acquired: 05/28/2020 Weeks Of Treatment: 1 Clustered Wound: No Photos Photo Uploaded By: Mikeal Hawthorne on 07/28/2020 11:22:06 Wound Measurements Length: (cm) 0.1 Width: (cm) 0.1 Depth: (cm) 0.1 Area: (cm) 0.008 Volume: (cm) 0.001 % Reduction in Area: 100% % Reduction in Volume: 100% Epithelialization: Large (67-100%) Tunneling: No Undermining: No Wound Description Classification: Full Thickness Without Exposed Support Struct Wound Margin: Indistinct, nonvisible Exudate Amount: Small Exudate Type: Serous Exudate Color: amber ures Foul Odor After Cleansing: No Slough/Fibrino No Wound Bed Granulation Amount: Small (1-33%) Exposed Structure Granulation Quality: Pink Fascia Exposed: No Necrotic Amount: None Present (0%) Fat Layer (Subcutaneous Tissue) Exposed: No Tendon Exposed: No Muscle Exposed: No Joint Exposed: No Bone Exposed: No Treatment Notes Wound #1 (Left, Medial Lower Leg) 1. Cleanse With Soap and water 2. Periwound Care TCA Cream 6. Support Layer Applied 3 layer compression wrap Notes netting Electronic Signature(s) Signed: 07/24/2020 5:56:33 PM By: Kela Millin Entered By: Kela Millin on 07/24/2020 15:47:16 -------------------------------------------------------------------------------- Vitals Details Patient Name: Date of Service: Jackie [name], [name] 07/24/2020 2:15 PM Medical Record Number: [id number] Patient Account Number: [id number] Date of Birth/Sex: Treating RN: [birth year] (86 y.o. Jackie [name] Primary Care Darrion Wyszynski: [name] Other Clinician: Referring Antonia Jicha: Treating Finleigh Cheong/Extender: [name] in  Treatment: 1 Vital Signs Time Taken: 15:12 Temperature (F): 98.1 Height (in): 61 Pulse (bpm): 66 Weight (lbs): 130 Respiratory Rate (breaths/min): 18 Body Mass Index (BMI): 24.6 Blood Pressure (mmHg): 152/74 Reference Range: 80 - 120 mg / dl Electronic Signature(s) Signed: 07/29/2020 11:00:36 AM By: Sandre Kitty Entered By: Sandre Kitty on 07/24/2020 15:12:38

## 2020-07-31 ENCOUNTER — Encounter (HOSPITAL_BASED_OUTPATIENT_CLINIC_OR_DEPARTMENT_OTHER): Payer: Medicare Other | Attending: Internal Medicine | Admitting: Internal Medicine

## 2020-07-31 DIAGNOSIS — L97321 Non-pressure chronic ulcer of left ankle limited to breakdown of skin: Secondary | ICD-10-CM | POA: Diagnosis present

## 2020-07-31 DIAGNOSIS — I87332 Chronic venous hypertension (idiopathic) with ulcer and inflammation of left lower extremity: Secondary | ICD-10-CM | POA: Insufficient documentation

## 2020-07-31 DIAGNOSIS — Z85850 Personal history of malignant neoplasm of thyroid: Secondary | ICD-10-CM | POA: Insufficient documentation

## 2020-08-04 NOTE — Progress Notes (Signed)
Jackie [name]. ([id number]) Visit Report for 07/31/2020 HPI Details Patient Name: Date of Service: Jackie [name] CE M. 07/31/2020 9:45 A M Medical Record Number: [id number] Patient Account Number: [id number] Date of Birth/Sex: Treating RN: Sep 30, 1932 (86 y.o. [name] Primary Care Provider: [name] Other Clinician: Referring Provider: Treating Provider/Extender: [name] in Treatment: 2 History of Present Illness HPI Description: ADMISSION 07/17/2020 This is an 86 year old woman who is referred by her nurse practitioner at [facility] who has been doing treatment for the wound on the right medial lower leg and ankle. Apparently 1 point a substantial wound however it is largely epithelialized now. Patient states its been there for about 5 or 6 weeks. On the background she has changes in the skin in this area I think the go back several years to when she had a left hip fracture. Nevertheless she does not have a wound history that I could elicit. As far as I know that they had been using TCA, Mepitel under an Unna boot they have been changing this weekly at [employer]. She also received a course of doxycycline. Past medical history very little that I can find in [place] link. She has a history of venous insufficiency bilaterally and a left hip fracture hypertension and a history of thyroidectomy for thyroid cancer. ABI in our clinic was 1.13 on the left 8/27; the patient is substantial initial wound by her description on the left medial lower leg and ankle is completely epithelialized. She has chronic venous insufficiency, dermatosclerosis and probably lymphedema. She does not have stockings and states that she would have trouble getting over the toe stockings on in any fashion because of hip problems except 9/3; this is a patient with chronic venous insufficiency with a particularly difficult area on her left  medial lower leg and ankle. My assessment of this is that she has chronic venous insufficiency and chronic venous hypertension. And at least on the left medial ankle stasis dermatitis. The area still looks angry and inflamed. Nevertheless it is epithelialized. I do not believe there is active infection. We have ordered her bilateral juxta lite stockings. These should be easier for her to put on but need to be adjusted to 30/40 compression. If this is not successful in maintaining this area I think she is going to need formal reflux studies to see if she might be a candidate for ablation of the greater saphenous vein Electronic Signature(s) Signed: 08/04/2020 7:41:19 AM By: Linton Ham MD Entered By: Linton Ham on 07/31/2020 11:56:01 -------------------------------------------------------------------------------- Physical Exam Details Patient Name: Date of Service: Jackie [name], Jackie CE M. 07/31/2020 9:45 A M Medical Record Number: [id number] Patient Account Number: [id number] Date of Birth/Sex: Treating RN: 10-10-1932 (86 y.o. [name] Primary Care Provider: [name] Other Clinician: Referring Provider: Treating Provider/Extender: [name] in Treatment: 2 Constitutional Patient is hypertensive.. Pulse regular and within target range for patient.Marland Kitchen Respirations regular, non-labored and within target range.. Temperature is normal and within the target range for the patient.Marland Kitchen Appears in no distress. Notes Wound exam; the left medial lower leg and ankle. Under illumination there is still a large area of this that is inflamed but it is fully epithelialized. There is no open area here. There is lesser degree of damage on the right medial lower leg and ankle. She does not have a lot of edema but certainly evidence of stasis dermatitis. Electronic Signature(s) Signed: 08/04/2020 7:41:19 AM  By: Linton Ham MD Entered By: Linton Ham on 07/31/2020  11:56:50 -------------------------------------------------------------------------------- Physician Orders Details Patient Name: Date of Service: Jackie [name], Jackie CE M. 07/31/2020 9:45 A M Medical Record Number: [id number] Patient Account Number: [id number] Date of Birth/Sex: Treating RN: June 28, 1932 (86 y.o. [name] Primary Care Provider: [name] Other Clinician: Referring Provider: Treating Provider/Extender: [name] in Treatment: 2 Verbal / Phone Orders: No Diagnosis Coding Discharge From [facility] Services Discharge from [place] - call if wound re-opens Skin Barriers/Peri-Wound Care Moisturizing lotion - at night before bed Edema Control Support Garment 30-40 mm/Hg pressure to: - Juxtalites Bilaterally, apply in the morning and remove at night before bed Electronic Signature(s) Signed: 07/31/2020 3:13:35 PM By: Kela Millin Signed: 08/04/2020 7:41:19 AM By: Linton Ham MD Entered By: Kela Millin on 07/31/2020 11:03:09 -------------------------------------------------------------------------------- Problem List Details Patient Name: Date of Service: Jackie [name], Jackie CE M. 07/31/2020 9:45 A M Medical Record Number: [id number] Patient Account Number: [id number] Date of Birth/Sex: Treating RN: May 23, 1932 (86 y.o. 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 07/31/2020 9:45 A M Medical Record Number: [id number] Patient Account Number: [id number] Date of Birth/Sex: Treating RN: 09/07/1932 (86 y.o. [name] Primary Care Provider: [name] Other Clinician: Referring Provider: Treating Provider/Extender: [name] in Treatment: 2 Subjective History of Present Illness (HPI) ADMISSION 07/17/2020 This is an 86 year old woman who is referred by her nurse practitioner at [facility] who has been doing treatment for the wound on the right medial lower leg and ankle. Apparently 1 point a substantial wound however it is largely epithelialized now. Patient states its been there for about 5 or 6 weeks. On the background she has changes in the skin in this area I think the go back several years to when she had a left hip fracture. Nevertheless she does not have a wound history that I could elicit. As far as I know that they had been using TCA, Mepitel under an Unna boot they have been changing this weekly at [facility] [employer]. She also received a course of doxycycline. Past medical history very little that I can find in [place] link. She has a history of venous insufficiency bilaterally and a left hip fracture hypertension and a history of thyroidectomy for thyroid cancer. ABI in our clinic was 1.13 on the left 8/27; the patient is substantial initial wound by her description on the left medial lower leg and ankle is completely epithelialized. She has chronic venous insufficiency, dermatosclerosis and probably lymphedema. She does not have stockings and states that she would have trouble getting over the toe stockings on in any fashion because of hip problems except 9/3; this is a patient with chronic venous  insufficiency with a particularly difficult area on her left medial lower leg and ankle. My assessment of this is that she has chronic venous insufficiency and chronic venous hypertension. And at least on the  left medial ankle stasis dermatitis. The area still looks angry and inflamed. Nevertheless it is epithelialized. I do not believe there is active infection. We have ordered her bilateral juxta lite stockings. These should be easier for her to put on but need to be adjusted to 30/40 compression. If this is not successful in maintaining this area I think she is going to need formal reflux studies to see if she might be a candidate for ablation of the greater saphenous vein Objective Constitutional Patient is hypertensive.. Pulse regular and within target range for patient.Marland Kitchen Respirations regular, non-labored and within target range.. Temperature is normal and within the target range for the patient.Marland Kitchen Appears in no distress. Vitals Time Taken: 10:12 AM, Height: 61 in, Weight: 130 lbs, BMI: 24.6, Temperature: 98.4 F, Pulse: 66 bpm, Respiratory Rate: 18 breaths/min, Blood Pressure: 156/80 mmHg. General Notes: Wound exam; the left medial lower leg and ankle. Under illumination there is still a large area of this that is inflamed but it is fully epithelialized. There is no open area here. There is lesser degree of damage on the right medial lower leg and ankle. She does not have a lot of edema but certainly evidence of stasis dermatitis. Integumentary (Hair, Skin) Wound #1 status is Healed - Epithelialized. Original cause of wound was Gradually Appeared. The wound is located on the Left,Medial Lower Leg. The wound measures 0cm length x 0cm width x 0cm depth; 0cm^2 area and 0cm^3 volume. There is no tunneling or undermining noted. There is a none present amount of drainage noted. The wound margin is indistinct and nonvisible. There is no granulation within the wound bed. There is no necrotic tissue  within the wound bed. Assessment Active Problems ICD-10 Chronic venous hypertension (idiopathic) with ulcer and inflammation of left lower extremity Non-pressure chronic ulcer of left ankle limited to breakdown of skin Plan Discharge From [facility] Services: Discharge from [place] - call if wound re-opens Skin Barriers/Peri-Wound Care: Moisturizing lotion - at night before bed Edema Control: Support Garment 30-40 mm/Hg pressure to: - Juxtalites Bilaterally, apply in the morning and remove at night before bed 1. 30/40 equivalent compression of her juxta lite stockings 2. If this does not maintain skin integrity in this area she is going to need venous reflux studies specifically looking at the greater saphenous vein and the possibility of ablation Electronic Signature(s) Signed: 08/04/2020 7:41:19 AM By: Linton Ham MD Entered By: Linton Ham on 07/31/2020 11:57:37 -------------------------------------------------------------------------------- SuperBill Details Patient Name: Date of Service: Jackie [name], Jackie CE M. 07/31/2020 Medical Record Number: [id number] Patient Account Number: [id number] Date of Birth/Sex: Treating RN: 1932-10-04 (86 y.o. [name] Primary Care Provider: [name] Other Clinician: Referring Provider: Treating Provider/Extender: [name] in Treatment: 2 Diagnosis Coding ICD-10 Codes Code Description [phone] Chronic venous hypertension (idiopathic) with ulcer and inflammation of left lower extremity L97.321 Non-pressure chronic ulcer of left ankle limited to breakdown of skin Facility Procedures CPT4 Code: [id number] Description: 99213 - WOUND CARE VISIT-LEV 3 EST PT Modifier: Quantity: 1 Physician Procedures : CPT4 Code Description Modifier [id number] 99213 - WC PHYS LEVEL 3 - EST PT ICD-10 Diagnosis Description I87.332 Chronic venous hypertension (idiopathic) with ulcer and inflammation of left lower extremity  L97.321 Non-pressure chronic ulcer of left ankle  limited to breakdown of skin Quantity: 1 Electronic Signature(s) Signed: 08/04/2020 7:41:19 AM By: Linton Ham MD Entered By: Linton Ham on 07/31/2020 11:58:15

## 2020-08-04 NOTE — Progress Notes (Signed)
NIKOLETTA, VARMA [place]. ([id number]) Visit Report for 07/31/2020 Arrival Information Details Patient Name: Date of Service: [name] CE M. 07/31/2020 9:45 A M Medical Record Number: [id number] Patient Account Number: [id number] Date of Birth/Sex: Treating RN: 1932/08/15 (86 y.o. Hollie Salk, Larene [place] Primary Care Finbar Nippert: [name] Other Clinician: Referring Shemeca Lukasik: Treating Naketa Daddario/Extender: [name] in Treatment: 2 Visit Information History Since Last Visit Added or deleted any medications: No Patient Arrived: [name] Any new allergies or adverse reactions: No Arrival Time: 10:11 Had a fall or experienced change in No Accompanied By: self activities of daily living that may affect Transfer Assistance: None risk of falls: Patient Identification Verified: Yes Signs or symptoms of abuse/neglect since last visito No Secondary Verification Process Completed: Yes Hospitalized since last visit: No Patient Requires Transmission-Based Precautions: No Implantable device outside of the clinic excluding No Patient Has Alerts: No cellular tissue based products placed in the center since last visit: Has Dressing in Place as Prescribed: Yes Pain Present Now: No Electronic Signature(s) Signed: 08/04/2020 12:03:37 PM By: Sandre Kitty Entered By: Sandre Kitty on 07/31/2020 10:12:43 -------------------------------------------------------------------------------- Clinic Level of Care Assessment Details Patient Name: Date of Service: [name] CE M. 07/31/2020 9:45 A M Medical Record Number: [id number] Patient Account Number: [id number] Date of Birth/Sex: Treating RN: Mar 29, 1932 (86 y.o. [name] Primary Care Murdock Jellison: [name] Other Clinician: Referring Yuval Rubens: Treating Roseline Ebarb/Extender: [name] in Treatment: 2 Clinic Level of Care Assessment Items TOOL 4 Quantity Score X- 1 0 Use when only an EandM is  performed on FOLLOW-UP visit ASSESSMENTS - Nursing Assessment / Reassessment X- 1 10 Reassessment of Co-morbidities (includes updates in patient status) X- 1 5 Reassessment of Adherence to Treatment Plan ASSESSMENTS - Wound and Skin A ssessment / Reassessment X - Simple Wound Assessment / Reassessment - one wound 1 5 []  - 0 Complex Wound Assessment / Reassessment - multiple wounds []  - 0 Dermatologic / Skin Assessment (not related to wound area) ASSESSMENTS - Focused Assessment X- 1 5 Circumferential Edema Measurements - multi extremities []  - 0 Nutritional Assessment / Counseling / Intervention []  - 0 Lower Extremity Assessment (monofilament, tuning fork, pulses) []  - 0 Peripheral Arterial Disease Assessment (using hand held doppler) ASSESSMENTS - Ostomy and/or Continence Assessment and Care []  - 0 Incontinence Assessment and Management []  - 0 Ostomy Care Assessment and Management (repouching, etc.) PROCESS - Coordination of Care X - Simple Patient / Family Education for ongoing care 1 15 []  - 0 Complex (extensive) Patient / Family Education for ongoing care X- 1 10 Staff obtains Programmer, systems, Records, T Results / Process Orders est []  - 0 Staff telephones HHA, Nursing Homes / Clarify orders / etc []  - 0 Routine Transfer to another Facility (non-emergent condition) []  - 0 Routine Hospital Admission (non-emergent condition) []  - 0 New Admissions / Biomedical engineer / Ordering NPWT Apligraf, etc. , []  - 0 Emergency Hospital Admission (emergent condition) X- 1 10 Simple Discharge Coordination []  - 0 Complex (extensive) Discharge Coordination PROCESS - Special Needs []  - 0 Pediatric / Minor Patient Management []  - 0 Isolation Patient Management []  - 0 Hearing / Language / Visual special needs []  - 0 Assessment of Community assistance (transportation, D/C planning, etc.) []  - 0 Additional assistance / Altered mentation []  - 0 Support Surface(s) Assessment  (bed, cushion, seat, etc.) INTERVENTIONS - Wound Cleansing / Measurement X - Simple Wound Cleansing - one wound 1 5 []  - 0 Complex Wound  Cleansing - multiple wounds X- 1 5 Wound Imaging (photographs - any number of wounds) []  - 0 Wound Tracing (instead of photographs) X- 1 5 Simple Wound Measurement - one wound []  - 0 Complex Wound Measurement - multiple wounds INTERVENTIONS - Wound Dressings []  - 0 Small Wound Dressing one or multiple wounds []  - 0 Medium Wound Dressing one or multiple wounds []  - 0 Large Wound Dressing one or multiple wounds []  - 0 Application of Medications - topical []  - 0 Application of Medications - injection INTERVENTIONS - Miscellaneous []  - 0 External ear exam []  - 0 Specimen Collection (cultures, biopsies, blood, body fluids, etc.) []  - 0 Specimen(s) / Culture(s) sent or taken to Lab for analysis []  - 0 Patient Transfer (multiple staff / Civil Service fast streamer / Similar devices) []  - 0 Simple Staple / Suture removal (25 or less) []  - 0 Complex Staple / Suture removal (26 or more) []  - 0 Hypo / Hyperglycemic Management (close monitor of Blood Glucose) []  - 0 Ankle / Brachial Index (ABI) - do not check if billed separately X- 1 5 Vital Signs Has the patient been seen at the hospital within the last three years: Yes Total Score: 80 Level Of Care: New/Established - Level 3 Electronic Signature(s) Signed: 07/31/2020 3:13:35 PM By: Kela Millin Entered By: Kela Millin on 07/31/2020 11:01:15 -------------------------------------------------------------------------------- Encounter Discharge Information Details Patient Name: Date of Service: [name], [name] 07/31/2020 9:45 A M Medical Record Number: [id number] Patient Account Number: [id number] Date of Birth/Sex: Treating RN: Feb 04, 1932 (86 y.o. [name] Primary Care Breah Joa: [name] Other Clinician: Referring Kehaulani Fruin: Treating Deandre Stansel/Extender: [name] in Treatment: 2 Encounter Discharge Information Items Discharge Condition: Stable Ambulatory Status: Cane Discharge Destination: Home Transportation: Private Auto Accompanied By: self Schedule Follow-up Appointment: Yes Clinical Summary of Care: Patient Declined Notes pt able to demonstrate proper application of juxtalite. Questions answered. Electronic Signature(s) Signed: 07/31/2020 2:45:56 PM By: Baruch Gouty RN, BSN Entered By: Baruch Gouty on 07/31/2020 11:34:46 -------------------------------------------------------------------------------- Lower Extremity Assessment Details Patient Name: Date of Service: [name], [name] 07/31/2020 9:45 A M Medical Record Number: [id number] Patient Account Number: [id number] Date of Birth/Sex: Treating RN: 11/02/1932 (86 y.o. [name] Primary Care Jin Shockley: [name] Other Clinician: Referring Donaven Criswell: Treating Katleen Carraway/Extender: [name] in Treatment: 2 Edema Assessment Assessed: [name]: No] [Right: No] Edema: [Left: Ye] [Right: s] Calf Left: Right: Point of Measurement: 38 cm From Medial Instep 33 cm cm Ankle Left: Right: Point of Measurement: 13 cm From Medial Instep 20.8 cm cm Vascular Assessment Pulses: Dorsalis Pedis Palpable: [Left:Yes] Electronic Signature(s) Signed: 07/31/2020 2:45:56 PM By: Baruch Gouty RN, BSN Entered By: Baruch Gouty on 07/31/2020 10:26:32 -------------------------------------------------------------------------------- Multi Wound Chart Details Patient Name: Date of Service: [name], [name] 07/31/2020 9:45 A M Medical Record Number: [id number] Patient Account Number: [id number] Date of Birth/Sex: Treating RN: November 13, 1932 (86 y.o. [name] Primary Care Clarity Ciszek: [name] Other Clinician: Referring Segundo Makela: Treating Geovanny Sartin/Extender: [name] in Treatment: 2 Vital Signs Height(in):  61 Pulse(bpm): 3 Weight(lbs): 130 Blood Pressure(mmHg): 156/80 Body Mass Index(BMI): 25 Temperature(F): 98.4 Respiratory Rate(breaths/min): 18 Photos: [1:No Photos Left, Medial Lower Leg] [N/A:N/A N/A] Wound Location: [1:Gradually Appeared] [N/A:N/A] Wounding Event: [1:Venous Leg Ulcer] [N/A:N/A] Primary Etiology: [1:Hypertension] [N/A:N/A] Comorbid History: [1:05/28/2020] [N/A:N/A] Date Acquired: [1:2] [N/A:N/A] Weeks of Treatment: [1:Healed - Epithelialized] [N/A:N/A] Wound Status: [1:0x0x0] [N/A:N/A] Measurements L x W x D (cm) [  1:0] [N/A:N/A] A (cm) : rea [1:0] [N/A:N/A] Volume (cm) : [1:100.00%] [N/A:N/A] % Reduction in A rea: [1:100.00%] [N/A:N/A] % Reduction in Volume: [1:Full Thickness Without Exposed] [N/A:N/A] Classification: [1:Support Structures None Present] [N/A:N/A] Exudate Amount: [1:Indistinct, nonvisible] [N/A:N/A] Wound Margin: [1:None Present (0%)] [N/A:N/A] Granulation Amount: [1:None Present (0%)] [N/A:N/A] Necrotic Amount: [1:Fascia: No] [N/A:N/A] Exposed Structures: [1:Fat Layer (Subcutaneous Tissue): No Tendon: No Muscle: No Joint: No Bone: No Large (67-100%)] [N/A:N/A] Treatment Notes Electronic Signature(s) Signed: 07/31/2020 3:13:35 PM By: Kela Millin Signed: 08/04/2020 7:41:19 AM By: Linton Ham MD Entered By: Linton Ham on 07/31/2020 11:54:01 -------------------------------------------------------------------------------- Multi-Disciplinary Care Plan Details Patient Name: Date of Service: [name], [name] 07/31/2020 9:45 A M Medical Record Number: [id number] Patient Account Number: [id number] Date of Birth/Sex: Treating RN: 10/16/1932 (86 y.o. [name] Primary Care Aaliyah Cancro: [name] Other Clinician: Referring Estela Vinal: Treating Tylin Force/Extender: [name] in Treatment: 2 Active Inactive Electronic Signature(s) Signed: 07/31/2020 3:13:35 PM By: Kela Millin Entered By:  Kela Millin on 07/31/2020 11:03:36 -------------------------------------------------------------------------------- Pain Assessment Details Patient Name: Date of Service: [name], [name] 07/31/2020 9:45 A M Medical Record Number: [id number] Patient Account Number: [id number] Date of Birth/Sex: Treating RN: 05/10/1932 (86 y.o. [name] Primary Care Amery Vandenbos: [name] Other Clinician: Referring Romel Dumond: Treating Dyasia Firestine/Extender: [name] in Treatment: 2 Active Problems Location of Pain Severity and Description of Pain Patient Has Paino No Site Locations Pain Management and Medication Current Pain Management: Electronic Signature(s) Signed: 07/31/2020 3:13:35 PM By: Kela Millin Signed: 08/04/2020 12:03:37 PM By: Sandre Kitty Entered By: Sandre Kitty on 07/31/2020 10:13:04 -------------------------------------------------------------------------------- Wound Assessment Details Patient Name: Date of Service: [name], [name] 07/31/2020 9:45 A M Medical Record Number: [id number] Patient Account Number: [id number] Date of Birth/Sex: Treating RN: 15-Feb-1932 (86 y.o. [name] Primary Care Reyanne Hussar: [name] Other Clinician: Referring Istvan Behar: Treating Eulala Newcombe/Extender: [name] in Treatment: 2 Wound Status Wound Number: 1 Primary Etiology: Venous Leg Ulcer Wound Location: Left, Medial Lower Leg Wound Status: Healed - Epithelialized Wounding Event: Gradually Appeared Comorbid History: Hypertension Date Acquired: 05/28/2020 Weeks Of Treatment: 2 Clustered Wound: No Wound Measurements Length: (cm) Width: (cm) Depth: (cm) Area: (cm) Volume: (cm) 0 % Reduction in Area: 100% 0 % Reduction in Volume: 100% 0 Epithelialization: Large (67-100%) 0 Tunneling: No 0 Undermining: No Wound Description Classification: Full Thickness Without Exposed Support  Structures Wound Margin: Indistinct, nonvisible Exudate Amount: None Present Foul Odor After Cleansing: No Slough/Fibrino No Wound Bed Granulation Amount: None Present (0%) Exposed Structure Necrotic Amount: None Present (0%) Fascia Exposed: No Fat Layer (Subcutaneous Tissue) Exposed: No Tendon Exposed: No Muscle Exposed: No Joint Exposed: No Bone Exposed: No Electronic Signature(s) Signed: 07/31/2020 2:45:56 PM By: Baruch Gouty RN, BSN Signed: 07/31/2020 3:13:35 PM By: Kela Millin Entered By: Baruch Gouty on 07/31/2020 10:27:08 -------------------------------------------------------------------------------- [place] Details Patient Name: Date of Service: [name], [name] 07/31/2020 9:45 A M Medical Record Number: [id number] Patient Account Number: [id number] Date of Birth/Sex: Treating RN: Oct 21, 1932 (86 y.o. [name] Primary Care Blima Jaimes: [name] Other Clinician: Referring Nia Nathaniel: Treating Matsuko Kretz/Extender: [name] in Treatment: 2 Vital Signs Time Taken: 10:12 Temperature (F): 98.4 Height (in): 61 Pulse (bpm): 66 Weight (lbs): 130 Respiratory Rate (breaths/min): 18 Body Mass Index (BMI): 24.6 Blood Pressure (mmHg): 156/80 Reference Range: 80 - 120 mg / dl Electronic Signature(s) Signed: 08/04/2020 12:03:37 PM By: Sandre Kitty Entered By:  Sandre Kitty on 07/31/2020 10:12:58

## 2020-08-05 ENCOUNTER — Other Ambulatory Visit: Payer: Self-pay | Admitting: *Deleted

## 2020-08-05 ENCOUNTER — Encounter (HOSPITAL_BASED_OUTPATIENT_CLINIC_OR_DEPARTMENT_OTHER): Payer: Medicare Other | Admitting: Physician Assistant

## 2020-08-05 ENCOUNTER — Other Ambulatory Visit: Payer: Self-pay | Admitting: Physician Assistant

## 2020-08-05 DIAGNOSIS — I87332 Chronic venous hypertension (idiopathic) with ulcer and inflammation of left lower extremity: Secondary | ICD-10-CM | POA: Diagnosis not present

## 2020-08-05 DIAGNOSIS — L97929 Non-pressure chronic ulcer of unspecified part of left lower leg with unspecified severity: Secondary | ICD-10-CM

## 2020-08-05 DIAGNOSIS — I83029 Varicose veins of left lower extremity with ulcer of unspecified site: Secondary | ICD-10-CM

## 2020-08-05 DIAGNOSIS — R609 Edema, unspecified: Secondary | ICD-10-CM

## 2020-08-05 NOTE — Progress Notes (Addendum)
Jackie, [name] [place]. ([id number]) Visit Report for 08/05/2020 Chief Complaint Document Details Patient Name: Date of Service: Jackie [name] CE M. 08/05/2020 8:15 A M Medical Record Number: [id number] Patient Account Number: [id number] Date of Birth/Sex: Treating RN: 02-Apr-1932 (86 y.o. Jackie [name] Primary Care Provider: Reynold [name] Other Clinician: Referring Provider: Treating Provider/Extender: Jackie [name], Jackie [name] in Treatment: 0 Information Obtained from: Patient Chief Complaint 07/17/2020; patient is here for review of a wound on her left medial ankle and lower leg Electronic Signature(s) Signed: 08/05/2020 8:36:55 AM By: Jackie Keeler PA-C Entered By: Jackie [name] on 08/05/2020 08:36:55 -------------------------------------------------------------------------------- HPI Details Patient Name: Date of Service: Jackie [name], [place] M. 08/05/2020 8:15 A M Medical Record Number: [id number] Patient Account Number: [id number] Date of Birth/Sex: Treating RN: 09-29-1932 (86 y.o. Jackie [name] Primary Care Provider: Reynold [name] Other Clinician: Referring Provider: Treating Provider/Extender: Jackie [name] in Treatment: 0 History of Present Illness HPI Description: ADMISSION 07/17/2020 This is an 86 year old woman who is referred by her nurse practitioner at [facility] who has been doing treatment for the wound on the right medial lower leg and ankle. Apparently 1 point a substantial wound however it is largely epithelialized now. Patient states its been there for about 5 or 6 weeks. On the background she has changes in the skin in this area I think the go back several years to when she had a left hip fracture. Nevertheless she does not have a wound history that I could elicit. As far as I know that they had been using TCA, Mepitel under an Unna boot they have been changing this weekly at [employer] [facility] [employer]. She also  received a course of doxycycline. Past medical history very little that I can find in [place] link. She has a history of venous insufficiency bilaterally and a left hip fracture hypertension and a history of thyroidectomy for thyroid cancer. ABI in our clinic was 1.13 on the left 8/27; the patient is substantial initial wound by her description on the left medial lower leg and ankle is completely epithelialized. She has chronic venous insufficiency, dermatosclerosis and probably lymphedema. She does not have stockings and states that she would have trouble getting over the toe stockings on in any fashion because of hip problems except 9/3; this is a patient with chronic venous insufficiency with a particularly difficult area on her left medial lower leg and ankle. My assessment of this is that she has chronic venous insufficiency and chronic venous hypertension. And at least on the left medial ankle stasis dermatitis. The area still looks angry and inflamed. Nevertheless it is epithelialized. I do not believe there is active infection. We have ordered her bilateral juxta lite stockings. These should be easier for her to put on but need to be adjusted to 30/40 compression. If this is not successful in maintaining this area I think she is going to need formal reflux studies to see if she might be a candidate for ablation of the greater saphenous vein 08/05/2020 upon evaluation today patient appears to be doing poorly in regard to her lower extremity. Unfortunately this has reopened after being healed on Friday. She tells me she was wearing her compression wrap but that even when she woke up in the morning on Saturday morning she was leaking. Fortunately there is no signs of active infection at this time systemically though there is some question of a thing locally. She does have  juxta light compression bilaterally. Electronic Signature(s) Signed: 08/05/2020 9:13:43 AM By: Jackie Keeler  PA-C Entered By: Jackie [name] on 08/05/2020 09:13:43 -------------------------------------------------------------------------------- Physical Exam Details Patient Name: Date of Service: Jackie [name], [place] M. 08/05/2020 8:15 A M Medical Record Number: [id number] Patient Account Number: [id number] Date of Birth/Sex: Treating RN: [birth year] (86 y.o. Jackie [name] Primary Care Provider: Reynold [name] Other Clinician: Referring Provider: Treating Provider/Extender: Jackie [name] in Treatment: 0 Constitutional Well-nourished and well-hydrated in no acute distress. Respiratory normal breathing without difficulty. Psychiatric this patient is able to make decisions and demonstrates good insight into disease process. Alert and Oriented x 3. pleasant and cooperative. Notes Upon inspection patient's wounds currently on the left medial lower extremity appear to be reopened at this point. She seems to be tolerating the Velcro wraps but she did not have either on upon evaluation today even on the heel right leg. Obviously I think this is something that she really should be wearing at all times and I discussed that with her in great detail today. There is some erythema around the wound on the left leg which I am going to probably prescribe an antibiotic for her in regard to to see if we can calm this down somewhat. Just to ensure there is no infection. Electronic Signature(s) Signed: 08/05/2020 9:14:22 AM By: Jackie Keeler PA-C Entered By: Jackie [name] on 08/05/2020 09:14:22 -------------------------------------------------------------------------------- Physician Orders Details Patient Name: Date of Service: Jackie [name], [place] M. 08/05/2020 8:15 A M Medical Record Number: [id number] Patient Account Number: [id number] Date of Birth/Sex: Treating RN: Dec 21, 1931 (86 y.o. Jackie [name] Primary Care Provider: Reynold [name] Other Clinician: Referring Provider: Treating  Provider/Extender: Jackie [name] in Treatment: 0 Verbal / Phone Orders: No Diagnosis Coding ICD-10 Coding Code Description [phone] Chronic venous hypertension (idiopathic) with ulcer and inflammation of left lower extremity L97.321 Non-pressure chronic ulcer of left ankle limited to breakdown of skin Follow-up Appointments Return Appointment in 1 week. Dressing Change Frequency Do not change entire dressing for one week. Skin Barriers/Peri-Wound Care Moisturizing lotion - lotion to leg TCA Cream or Ointment - on wound bed as well Wound Cleansing May shower with protection. - cast protector Primary Wound Dressing Wound #1R Left,Medial Lower Leg Calcium Alginate with Silver Secondary Dressing Wound #1R Left,Medial Lower Leg ABD pad Edema Control 3 Layer Compression System - Left Lower Extremity Avoid standing for long periods of time Elevate legs to the level of the heart or above for 30 minutes daily and/or when sitting, a frequency of: Exercise regularly Support Garment 20-30 mm/Hg pressure to: - juxtalite compression garment to right leg daily Services and Therapies Venous Studies with reflux -Bilateral - recurring venous ulcer left medial lower leg, bilateral lower extremity edema - (ICD10 L97.321 - Non-pressure chronic ulcer of left ankle limited to breakdown of skin) Patient Medications llergies: codeine, Zithromax A Notifications Medication Indication Start End 08/05/2020 Bactrim DS DOSE 1 - oral 800 mg-160 mg tablet - 1 tablet oral taken 2 times per day for 14 days Electronic Signature(s) Signed: 08/05/2020 9:17:52 AM By: Jackie Keeler PA-C Entered By: Jackie [name] on 08/05/2020 09:17:51 Prescription 08/05/2020 -------------------------------------------------------------------------------- Jackie [name], [name]. Jackie [name] [place] Patient Name: Provider: 10-10-1932 [id number] Date of Birth: NPI#: [id number] Sex: DEA #: [phone] Phone  #: License #: [place] Patient Address: [place] [address] [place], [place] [address] [address]  [place], [place] [address] [phone] Allergies codeine; Zithromax Provider's Orders Venous Studies with reflux -Bilateral - ICD10: L97.321 - recurring venous ulcer left medial lower leg, bilateral lower extremity edema Hand Signature: Date(s): Electronic Signature(s) Signed: 08/05/2020 5:46:37 PM By: Jackie Keeler PA-C Entered By: Jackie [name] on 08/05/2020 09:17:53 -------------------------------------------------------------------------------- Problem List Details Patient Name: Date of Service: Jackie [name], [place] M. 08/05/2020 8:15 A M Medical Record Number: [id number] Patient Account Number: [id number] Date of Birth/Sex: Treating RN: 05/06/1932 (86 y.o. Jackie [name] Primary Care Provider: Reynold [name] Other Clinician: Referring Provider: Treating Provider/Extender: Jackie [name] in Treatment: 0 Active Problems ICD-10 Encounter Code Description Active Date MDM Diagnosis I87.332 Chronic venous hypertension (idiopathic) with ulcer and inflammation of left 07/17/2020 No Yes lower extremity L97.321 Non-pressure chronic ulcer of left ankle limited to breakdown of skin 07/17/2020 No Yes Inactive Problems Resolved Problems Electronic Signature(s) Signed: 08/05/2020 8:35:55 AM By: Jackie Keeler PA-C Entered By: Jackie [name] on 08/05/2020 08:35:55 -------------------------------------------------------------------------------- Progress Note Details Patient Name: Date of Service: Jackie [name], [place] M. 08/05/2020 8:15 A M Medical Record Number: [id number] Patient Account Number: [id number] Date of Birth/Sex: Treating RN: [birth year] (86 y.o. Jackie [name] Primary Care Provider: Reynold [name] Other Clinician: Referring Provider: Treating Provider/Extender: Jackie [name] in  Treatment: 0 Subjective Chief Complaint Information obtained from Patient 07/17/2020; patient is here for review of a wound on her left medial ankle and lower leg History of Present Illness (HPI) ADMISSION 07/17/2020 This is an 86 year old woman who is referred by her nurse practitioner at [facility] who has been doing treatment for the wound on the right medial lower leg and ankle. Apparently 1 point a substantial wound however it is largely epithelialized now. Patient states its been there for about 5 or 6 weeks. On the background she has changes in the skin in this area I think the go back several years to when she had a left hip fracture. Nevertheless she does not have a wound history that I could elicit. As far as I know that they had been using TCA, Mepitel under an Unna boot they have been changing this weekly at [facility]. She also received a course of doxycycline. Past medical history very little that I can find in [place] link. She has a history of venous insufficiency bilaterally and a left hip fracture hypertension and a history of thyroidectomy for thyroid cancer. ABI in our clinic was 1.13 on the left 8/27; the patient is substantial initial wound by her description on the left medial lower leg and ankle is completely epithelialized. She has chronic venous insufficiency, dermatosclerosis and probably lymphedema. She does not have stockings and states that she would have trouble getting over the toe stockings on in any fashion because of hip problems except 9/3; this is a patient with chronic venous insufficiency with a particularly difficult area on her left medial lower leg and ankle. My assessment of this is that she has chronic venous insufficiency and chronic venous hypertension. And at least on the left medial ankle stasis dermatitis. The area still looks angry and inflamed. Nevertheless it is epithelialized. I do not believe there is  active infection. We have ordered her bilateral juxta lite stockings. These should be easier for her to put on but need to be adjusted to 30/40 compression. If this is not successful in maintaining this area I think she is going to need  formal reflux studies to see if she might be a candidate for ablation of the greater saphenous vein 08/05/2020 upon evaluation today patient appears to be doing poorly in regard to her lower extremity. Unfortunately this has reopened after being healed on Friday. She tells me she was wearing her compression wrap but that even when she woke up in the morning on Saturday morning she was leaking. Fortunately there is no signs of active infection at this time systemically though there is some question of a thing locally. She does have juxta light compression bilaterally. Objective Constitutional Well-nourished and well-hydrated in no acute distress. Vitals Time Taken: 8:34 AM, Height: 61 in, Weight: 130 lbs, BMI: 24.6, Temperature: 98.0 F, Pulse: 69 bpm, Respiratory Rate: 16 breaths/min, Blood Pressure: 182/78 mmHg. Respiratory normal breathing without difficulty. Psychiatric this patient is able to make decisions and demonstrates good insight into disease process. Alert and Oriented x 3. pleasant and cooperative. General Notes: Upon inspection patient's wounds currently on the left medial lower extremity appear to be reopened at this point. She seems to be tolerating the Velcro wraps but she did not have either on upon evaluation today even on the heel right leg. Obviously I think this is something that she really should be wearing at all times and I discussed that with her in great detail today. There is some erythema around the wound on the left leg which I am going to probably prescribe an antibiotic for her in regard to to see if we can calm this down somewhat. Just to ensure there is no infection. Integumentary (Hair, Skin) Wound #1R status is Open. Original  cause of wound was Gradually Appeared. The wound is located on the Left,Medial Lower Leg. The wound measures 6.1cm length x 3cm width x 0.1cm depth; 14.373cm^2 area and 1.437cm^3 volume. There is Fat Layer (Subcutaneous Tissue) exposed. There is no tunneling or undermining noted. There is a medium amount of serous drainage noted. The wound margin is indistinct and nonvisible. There is large (67-100%) pink, pale granulation within the wound bed. There is no necrotic tissue within the wound bed. Assessment Active Problems ICD-10 Chronic venous hypertension (idiopathic) with ulcer and inflammation of left lower extremity Non-pressure chronic ulcer of left ankle limited to breakdown of skin Procedures Wound #1R Pre-procedure diagnosis of Wound #1R is a Venous Leg Ulcer located on the Left,Medial Lower Leg . There was a Three Layer Compression Therapy Procedure by Carlene Coria, RN. Post procedure Diagnosis Wound #1R: Same as Pre-Procedure Plan Follow-up Appointments: Return Appointment in 1 week. Dressing Change Frequency: Do not change entire dressing for one week. Skin Barriers/Peri-Wound Care: Moisturizing lotion - lotion to leg TCA Cream or Ointment - on wound bed as well Wound Cleansing: May shower with protection. - cast protector Primary Wound Dressing: Wound #1R Left,Medial Lower Leg: Calcium Alginate with Silver Secondary Dressing: Wound #1R Left,Medial Lower Leg: ABD pad Edema Control: 3 Layer Compression System - Left Lower Extremity Avoid standing for long periods of time Elevate legs to the level of the heart or above for 30 minutes daily and/or when sitting, a frequency of: Exercise regularly Support Garment 20-30 mm/Hg pressure to: - juxtalite compression garment to right leg daily Services and Therapies ordered were: Venous Studies with reflux -Bilateral - recurring venous ulcer left medial lower leg, bilateral lower extremity edema The following medication(s) was  prescribed: Bactrim DS oral 800 mg-160 mg tablet 1 1 tablet oral taken 2 times per day for 14 days starting 08/05/2020 1. I  would recommend at this time that we go ahead and continue or rather reinitiate a 3 layer compression wrap to get the edema under control which should hopefully slow down the drainage. 2. I am also can recommend that we use a silver alginate dressing I think this is probably the best option for her. 3. I am also can recommend that the patient should elevate her legs much as possible when she is sitting although I do not have any restrictions on her being able to move around and do what she needs to do at home. She just needs to make sure to elevate when sitting and not stand for long periods of time in a single place. 4. I am also can I go ahead and see about making referral to a provider for venous studies and potential intervention depending on what they can find on the studies. We will see patient back for reevaluation in 1 week here in the clinic. If anything worsens or changes patient will contact our office for additional recommendations. Electronic Signature(s) Signed: 08/05/2020 9:18:48 AM By: Jackie Keeler PA-C Previous Signature: 08/05/2020 9:15:34 AM Version By: Jackie Keeler PA-C Entered By: Jackie [name] on 08/05/2020 09:18:47 -------------------------------------------------------------------------------- SuperBill Details Patient Name: Date of Service: Jackie [name], [place]. 08/05/2020 Medical Record Number: [id number] Patient Account Number: [id number] Date of Birth/Sex: Treating RN: 1931/12/20 (86 y.o. Jackie [name] Primary Care Provider: Reynold [name] Other Clinician: Referring Provider: Treating Provider/Extender: Jackie [name] in Treatment: 0 Diagnosis Coding ICD-10 Codes Code Description [phone] Chronic venous hypertension (idiopathic) with ulcer and inflammation of left lower extremity L97.321 Non-pressure chronic ulcer  of left ankle limited to breakdown of skin Facility Procedures CPT4 Code: [id number] Description: [place] VISIT-LEV 3 EST PT Modifier: 25 Quantity: 1 CPT4 Code: [id number] Description: (Facility Use Only) [id number] - [facility] LWR LT LEG Modifier: Quantity: 1 Physician Procedures Electronic Signature(s) Signed: 08/05/2020 9:16:50 AM By: Jackie Keeler PA-C Entered By: Jackie [name] on 08/05/2020 09:16:49

## 2020-08-06 NOTE — Progress Notes (Signed)
DEVORY, MCKINZIE Scofield. ([id number]) Visit Report for 08/05/2020 Arrival Information Details Patient Name: Date of Service: [name] CE M. 08/05/2020 8:15 A M Medical Record Number: [id number] Patient Account Number: [id number] Date of Birth/Sex: Treating RN: 1932/08/09 (86 y.o. [name] Primary Care Lydiah Pong: [name] Other Clinician: Referring Damione Robideau: Treating Berry Gallacher/Extender: Terence Lux in Treatment: 0 Visit Information History Since Last Visit Added or deleted any medications: No Patient Arrived: [name] Any new allergies or adverse reactions: No Arrival Time: 08:32 Had a fall or experienced change in No Accompanied By: alone activities of daily living that may affect Transfer Assistance: None risk of falls: Patient Identification Verified: Yes Signs or symptoms of abuse/neglect since last visito No Secondary Verification Process Completed: Yes Hospitalized since last visit: No Implantable device outside of the clinic excluding No cellular tissue based products placed in the center since last visit: Pain Present Now: No Electronic Signature(s) Signed: 08/06/2020 5:37:15 PM By: Levan Hurst RN, BSN Entered By: Levan Hurst on 08/05/2020 08:33:30 -------------------------------------------------------------------------------- Clinic Level of Care Assessment Details Patient Name: Date of Service: [name], [place]. 08/05/2020 8:15 A M Medical Record Number: [id number] Patient Account Number: [id number] Date of Birth/Sex: Treating RN: [birth year] (86 y.o. [name] Primary Care Amaurie Wandel: [name] Other Clinician: Referring Paitynn Mikus: Treating Timotheus Salm/Extender: Terence Lux in Treatment: 0 Clinic Level of Care Assessment Items TOOL 4 Quantity Score _0  - 0 Use when only an EandM is performed on FOLLOW-UP visit ASSESSMENTS - Nursing Assessment / Reassessment X- 1 10 Reassessment of Co-morbidities  (includes updates in patient status) X- 1 5 Reassessment of Adherence to Treatment Plan ASSESSMENTS - Wound and Skin A ssessment / Reassessment X - Simple Wound Assessment / Reassessment - one wound 1 5 _1  - 0 Complex Wound Assessment / Reassessment - multiple wounds _2  - 0 Dermatologic / Skin Assessment (not related to wound area) ASSESSMENTS - Focused Assessment X- 1 5 Circumferential Edema Measurements - multi extremities _3  - 0 Nutritional Assessment / Counseling / Intervention X- 1 5 Lower Extremity Assessment (monofilament, tuning fork, pulses) _4  - 0 Peripheral Arterial Disease Assessment (using hand held doppler) ASSESSMENTS - Ostomy and/or Continence Assessment and Care _5  - 0 Incontinence Assessment and Management _6  - 0 Ostomy Care Assessment and Management (repouching, etc.) PROCESS - Coordination of Care X - Simple Patient / Family Education for ongoing care 1 15 _7  - 0 Complex (extensive) Patient / Family Education for ongoing care X- 1 10 Staff obtains Programmer, systems, Records, T Results / Process Orders est _8  - 0 Staff telephones HHA, Nursing Homes / Clarify orders / etc _9  - 0 Routine Transfer to another Facility (non-emergent condition) _10  - 0 Routine Hospital Admission (non-emergent condition) _11  - 0 New Admissions / Biomedical engineer / Ordering NPWT Apligraf, etc. , _12  - 0 Emergency Hospital Admission (emergent condition) X- 1 10 Simple Discharge Coordination _13  - 0 Complex (extensive) Discharge Coordination PROCESS - Special Needs _14  - 0 Pediatric / Minor Patient Management _15  - 0 Isolation Patient Management _16  - 0 Hearing / Language / Visual special needs _17  - 0 Assessment of Community assistance (transportation, D/C planning, etc.) _18  - 0 Additional assistance / Altered mentation _19  - 0 Support Surface(s) Assessment (bed, cushion, seat, etc.) INTERVENTIONS - Wound Cleansing / Measurement X - Simple Wound Cleansing - one wound 1 5 _20   - 0 Complex Wound Cleansing - multiple wounds X- 1 5 Wound Imaging (photographs - any  number of wounds) _0  - 0 Wound Tracing (instead of photographs) X- 1 5 Simple Wound Measurement - one wound _1  - 0 Complex Wound Measurement - multiple wounds INTERVENTIONS - Wound Dressings X - Small Wound Dressing one or multiple wounds 1 10 _2  - 0 Medium Wound Dressing one or multiple wounds _3  - 0 Large Wound Dressing one or multiple wounds X- 1 5 Application of Medications - topical <WEXHBZJIRCVELFYB>_0<\/FBPZWCHENIDPOEUM>_3  - 0 Application of Medications - injection INTERVENTIONS - Miscellaneous _5  - 0 External ear exam _6  - 0 Specimen Collection (cultures, biopsies, blood, body fluids, etc.) _7  - 0 Specimen(s) / Culture(s) sent or taken to Lab for analysis _8  - 0 Patient Transfer (multiple staff / Civil Service fast streamer / Similar devices) _9  - 0 Simple Staple / Suture removal (25 or less) _10  - 0 Complex Staple / Suture removal (26 or more) _11  - 0 Hypo / Hyperglycemic Management (close monitor of Blood Glucose) _12  - 0 Ankle / Brachial Index (ABI) - do not check if billed separately X- 1 5 Vital Signs Has the patient been seen at the hospital within the last three years: Yes Total Score: 100 Level Of Care: New/Established - Level 3 Electronic Signature(s) Signed: 08/05/2020 5:28:47 PM By: Baruch Gouty RN, BSN Entered By: Baruch Gouty on 08/05/2020 09:07:33 -------------------------------------------------------------------------------- Compression Therapy Details Patient Name: Date of Service: [name], [place] M. 08/05/2020 8:15 A M Medical Record Number: [id number] Patient Account Number: [id number] Date of Birth/Sex: Treating RN: 1932-01-24 (86 y.o. [name] Primary Care Laylaa Guevarra: [name] Other Clinician: Referring Gissel Keilman: Treating Lafe Clerk/Extender: Terence Lux in Treatment: 0 Compression Therapy Performed for Wound Assessment: Wound #1R Left,Medial Lower Leg Performed By:  Clinician Carlene Coria, RN Compression Type: Three Layer Post Procedure Diagnosis Same as Pre-procedure Electronic Signature(s) Signed: 08/05/2020 5:28:47 PM By: Baruch Gouty RN, BSN Entered By: Baruch Gouty on 08/05/2020 09:07:52 -------------------------------------------------------------------------------- Encounter Discharge Information Details Patient Name: Date of Service: [name], [place] M. 08/05/2020 8:15 A M Medical Record Number: [id number] Patient Account Number: [id number] Date of Birth/Sex: Treating RN: [birth year] (86 y.o. [name] Primary Care Shandee Jergens: [name] Other Clinician: Referring Adelie Croswell: Treating Danyon Mcginness/Extender: Terence Lux in Treatment: 0 Encounter Discharge Information Items Discharge Condition: Stable Ambulatory Status: Ambulatory Discharge Destination: Home Transportation: Private Auto Accompanied By: self Schedule Follow-up Appointment: Yes Clinical Summary of Care: Patient Declined Electronic Signature(s) Signed: 08/05/2020 5:14:09 PM By: Carlene Coria RN Entered By: Carlene Coria on 08/05/2020 09:32:30 -------------------------------------------------------------------------------- Lower Extremity Assessment Details Patient Name: Date of Service: [name], [place] M. 08/05/2020 8:15 A M Medical Record Number: [id number] Patient Account Number: [id number] Date of Birth/Sex: Treating RN: 15-Mar-1932 (86 y.o. [name] Primary Care Kartik Fernando: [name] Other Clinician: Referring Carlethia Mesquita: Treating Allesha Aronoff/Extender: Terence Lux in Treatment: 0 Edema Assessment Assessed: [Left: No] [Right: No] Edema: [Left: Ye] [Right: s] Calf Left: Right: Point of Measurement: 38 cm From Medial Instep 36 cm cm Ankle Left: Right: Point of Measurement: 13 cm From Medial Instep 22.5 cm cm Vascular Assessment Pulses: Dorsalis Pedis Palpable: [Left:Yes] Electronic  Signature(s) Signed: 08/06/2020 5:37:15 PM By: Levan Hurst RN, BSN Entered By: Levan Hurst on 08/05/2020 08:35:22 -------------------------------------------------------------------------------- [place] Details Patient Name: Date of Service: [name], [place] M. 08/05/2020 8:15 A M Medical Record Number: [id number] Patient Account Number: [id number] Date of Birth/Sex: Treating RN: February 14, 1932 (86 y.o. [name] Primary Care Kadarius Cuffe: [name]  Other Clinician: Referring Brae Gartman: Treating Story Vanvranken/Extender: Terence Lux in Treatment: 0 Active Inactive Venous Leg Ulcer Nursing Diagnoses: Knowledge deficit related to disease process and management Goals: Patient will maintain optimal edema control Date Initiated: 08/05/2020 Target Resolution Date: 09/02/2020 Goal Status: Active Patient/caregiver will verbalize understanding of disease process and disease management Date Initiated: 07/17/2020 Date Inactivated: 07/31/2020 Target Resolution Date: 08/21/2020 Goal Status: Met Interventions: Compression as ordered Provide education on venous insufficiency Notes: Wound/Skin Impairment Nursing Diagnoses: Impaired tissue integrity Knowledge deficit related to ulceration/compromised skin integrity Goals: Patient/caregiver will verbalize understanding of skin care regimen Date Initiated: 08/05/2020 Target Resolution Date: 09/02/2020 Goal Status: Active Ulcer/skin breakdown will have a volume reduction of 30% by week 4 Date Initiated: 07/17/2020 Target Resolution Date: 09/02/2020 Goal Status: Active Interventions: Provide education on ulcer and skin care Notes: Electronic Signature(s) Signed: 08/05/2020 5:28:47 PM By: Baruch Gouty RN, BSN Entered By: Baruch Gouty on 08/05/2020 09:05:05 -------------------------------------------------------------------------------- Pain Assessment Details Patient Name: Date of Service: [name], [place] M. 08/05/2020 8:15 A M Medical Record Number: [id number] Patient Account Number: [id number] Date of Birth/Sex: Treating RN: 1932-05-08 (86 y.o. [name] Primary Care Simi Briel: [name] Other Clinician: Referring Mahamed Zalewski: Treating Saraann Enneking/Extender: Terence Lux in Treatment: 0 Active Problems Location of Pain Severity and Description of Pain Patient Has Paino No Site Locations Pain Management and Medication Current Pain Management: Electronic Signature(s) Signed: 08/06/2020 5:37:15 PM By: Levan Hurst RN, BSN Entered By: Levan Hurst on 08/05/2020 08:37:11 -------------------------------------------------------------------------------- Patient/Caregiver Education Details Patient Name: Date of Service: [name], [place]. 9/8/2021andnbsp8:15 A M Medical Record Number: [id number] Patient Account Number: [id number] Date of Birth/Gender: Treating RN: 21-Sep-1932 (86 y.o. [name] Primary Care Physician: [name] Other Clinician: Referring Physician: Treating Physician/Extender: Terence Lux in Treatment: 0 Education Assessment Education Provided To: Patient Education Topics Provided Venous: Methods: Explain/Verbal Responses: Reinforcements needed, State content correctly Wound/Skin Impairment: Methods: Explain/Verbal Responses: Reinforcements needed, State content correctly Electronic Signature(s) Signed: 08/05/2020 5:28:47 PM By: Baruch Gouty RN, BSN Entered By: Baruch Gouty on 08/05/2020 09:06:34 -------------------------------------------------------------------------------- Wound Assessment Details Patient Name: Date of Service: [name], [place] M. 08/05/2020 8:15 A M Medical Record Number: [id number] Patient Account Number: [id number] Date of Birth/Sex: Treating RN: 1932/01/17 (86 y.o. [name] Primary Care Tiffney Haughton: [name] Other Clinician: Referring  Aerielle Stoklosa: Treating Beckham Capistran/Extender: Terence Lux in Treatment: 0 Wound Status Wound Number: 1R Primary Etiology: Venous Leg Ulcer Wound Location: Left, Medial Lower Leg Wound Status: Open Wounding Event: Gradually Appeared Comorbid History: Hypertension Date Acquired: 05/28/2020 Weeks Of Treatment: 2 Clustered Wound: No Photos Photo Uploaded By: Mikeal Hawthorne on 08/06/2020 12:43:13 Wound Measurements Length: (cm) 6.1 Width: (cm) 3 Depth: (cm) 0.1 Area: (cm) 14.373 Volume: (cm) 1.437 % Reduction in Area: 49.2% % Reduction in Volume: 49.2% Epithelialization: None Tunneling: No Undermining: No Wound Description Classification: Full Thickness Without Exposed Support Struct Wound Margin: Indistinct, nonvisible Exudate Amount: Medium Exudate Type: Serous Exudate Color: amber ures Foul Odor After Cleansing: No Slough/Fibrino No Wound Bed Granulation Amount: Large (67-100%) Exposed Structure Granulation Quality: Pink, Pale Fascia Exposed: No Necrotic Amount: None Present (0%) Fat Layer (Subcutaneous Tissue) Exposed: Yes Tendon Exposed: No Muscle Exposed: No Joint Exposed: No Bone Exposed: No Treatment Notes Wound #1R (Left, Medial Lower Leg) 1. Cleanse With Wound Cleanser 3. Primary Dressing Applied Calcium Alginate Ag 4. Secondary Dressing ABD Pad Dry Gauze 6. Support Layer  Applied 3 layer compression wrap Notes netting Electronic Signature(s) Signed: 08/06/2020 5:37:15 PM By: Levan Hurst RN, BSN Entered By: Levan Hurst on 08/05/2020 08:36:57 -------------------------------------------------------------------------------- [place] Details Patient Name: Date of Service: [name], [place] M. 08/05/2020 8:15 A M Medical Record Number: [id number] Patient Account Number: [id number] Date of Birth/Sex: Treating RN: [birth year] (86 y.o. [name] Primary Care Justinian Miano: [name] Other Clinician: Referring Louise Rawson: Treating  Makoto Sellitto/Extender: Terence Lux in Treatment: 0 Vital Signs Time Taken: 08:34 Temperature (F): 98.0 Height (in): 61 Pulse (bpm): 69 Weight (lbs): 130 Respiratory Rate (breaths/min): 16 Body Mass Index (BMI): 24.6 Blood Pressure (mmHg): 182/78 Reference Range: 80 - 120 mg / dl Electronic Signature(s) Signed: 08/06/2020 5:37:15 PM By: Levan Hurst RN, BSN Entered By: Levan Hurst on 08/05/2020 08:35:13

## 2020-08-12 ENCOUNTER — Encounter (HOSPITAL_BASED_OUTPATIENT_CLINIC_OR_DEPARTMENT_OTHER): Payer: Medicare Other | Admitting: Physician Assistant

## 2020-08-12 ENCOUNTER — Other Ambulatory Visit: Payer: Self-pay

## 2020-08-12 DIAGNOSIS — I87332 Chronic venous hypertension (idiopathic) with ulcer and inflammation of left lower extremity: Secondary | ICD-10-CM | POA: Diagnosis not present

## 2020-08-12 NOTE — Progress Notes (Addendum)
[name], Jackie [name]. ([id number]) Visit Report for 08/12/2020 Chief Complaint Document Details Patient Name: Date of Service: Jackie [name] CE M. 08/12/2020 2:15 PM Medical Record Number: [id number] Patient Account Number: [id number] Date of Birth/Sex: Treating RN: July 19, 1932 (86 y.o. [name] Primary Care Provider: [name] Other Clinician: Referring Provider: Treating Provider/Extender: Margot Ables, Alexis Goodell in Treatment: 3 Information Obtained from: Patient Chief Complaint 07/17/2020; patient is here for review of a wound on her left medial ankle and lower leg Electronic Signature(s) Signed: 08/12/2020 1:51:56 PM By: Worthy Keeler PA-C Entered By: Worthy Keeler on 08/12/2020 13:51:56 -------------------------------------------------------------------------------- HPI Details Patient Name: Date of Service: Jackie [name], [name]. 08/12/2020 2:15 PM Medical Record Number: [id number] Patient Account Number: [id number] Date of Birth/Sex: Treating RN: May 18, 1932 (86 y.o. [name] Primary Care Provider: [name] Other Clinician: Referring Provider: Treating Provider/Extender: Terence Lux in Treatment: 3 History of Present Illness HPI Description: ADMISSION 07/17/2020 This is an 86 year old woman who is referred by her nurse practitioner at [facility] who has been doing treatment for the wound on the right medial lower leg and ankle. Apparently 1 point a substantial wound however it is largely epithelialized now. Patient states its been there for about 5 or 6 weeks. On the background she has changes in the skin in this area I think the go back several years to when she had a left hip fracture. Nevertheless she does not have a wound history that I could elicit. As far as I know that they had been using TCA, Mepitel under an Unna boot they have been changing this weekly at [facility] [employer]. She also  received a course of doxycycline. Past medical history very little that I can find in [place] link. She has a history of venous insufficiency bilaterally and a left hip fracture hypertension and a history of thyroidectomy for thyroid cancer. ABI in our clinic was 1.13 on the left 8/27; the patient is substantial initial wound by her description on the left medial lower leg and ankle is completely epithelialized. She has chronic venous insufficiency, dermatosclerosis and probably lymphedema. She does not have stockings and states that she would have trouble getting over the toe stockings on in any fashion because of hip problems except 9/3; this is a patient with chronic venous insufficiency with a particularly difficult area on her left medial lower leg and ankle. My assessment of this is that she has chronic venous insufficiency and chronic venous hypertension. And at least on the left medial ankle stasis dermatitis. The area still looks angry and inflamed. Nevertheless it is epithelialized. I do not believe there is active infection. We have ordered her bilateral juxta lite stockings. These should be easier for her to put on but need to be adjusted to 30/40 compression. If this is not successful in maintaining this area I think she is going to need formal reflux studies to see if she might be a candidate for ablation of the greater saphenous vein 08/05/2020 upon evaluation today patient appears to be doing poorly in regard to her lower extremity. Unfortunately this has reopened after being healed on Friday. She tells me she was wearing her compression wrap but that even when she woke up in the morning on Saturday morning she was leaking. Fortunately there is no signs of active infection at this time systemically though there is some question of a thing locally. She does have juxta light  compression bilaterally. 08/12/2020 on evaluation today patient appears to be doing well with regard to her leg  ulcer which actually is closed again at this point. With that being said she is still having some issues currently with very thin skin that has come back over and again with her swelling if we do not wrap around afraid this is can open right back up. Nonetheless I do believe that she should proceed with the vascular testing next Thursday as previously ordered and recommended. Fortunately there is no signs of active infection at this time. Electronic Signature(s) Signed: 08/12/2020 3:20:28 PM By: Worthy Keeler PA-C Entered By: Worthy Keeler on 08/12/2020 15:20:28 -------------------------------------------------------------------------------- Physical Exam Details Patient Name: Date of Service: Jackie [name], Jackie [name]. 08/12/2020 2:15 PM Medical Record Number: [id number] Patient Account Number: [id number] Date of Birth/Sex: Treating RN: [birth year] (86 y.o. [name] Primary Care Provider: [name] Other Clinician: Referring Provider: Treating Provider/Extender: Terence Lux in Treatment: 3 Constitutional Well-nourished and well-hydrated in no acute distress. Respiratory normal breathing without difficulty. Psychiatric this patient is able to make decisions and demonstrates good insight into disease process. Alert and Oriented x 3. pleasant and cooperative. Notes Upon inspection patient's wound again showed signs of being healed I do not see any evidence of opening at this point to be honest although there may be a small point 1 area obviously we see where there was some weeping but right now it appears to be doing okay. I would keep this open just to monitor but nonetheless hopefully this will be completely closed and obvious by next week. Electronic Signature(s) Signed: 08/12/2020 3:20:52 PM By: Worthy Keeler PA-C Entered By: Worthy Keeler on 08/12/2020  15:20:51 -------------------------------------------------------------------------------- Physician Orders Details Patient Name: Date of Service: Jackie [name], [place]. 08/12/2020 2:15 PM Medical Record Number: [id number] Patient Account Number: [id number] Date of Birth/Sex: Treating RN: 1932-05-04 (86 y.o. [name] Primary Care Provider: [name] Other Clinician: Referring Provider: Treating Provider/Extender: Terence Lux in Treatment: 3 Verbal / Phone Orders: No Diagnosis Coding ICD-10 Coding Code Description [phone] Chronic venous hypertension (idiopathic) with ulcer and inflammation of left lower extremity L97.321 Non-pressure chronic ulcer of left ankle limited to breakdown of skin Follow-up Appointments Return Appointment in 1 week. Dressing Change Frequency Do not change entire dressing for one week. Skin Barriers/Peri-Wound Care Moisturizing lotion - lotion to leg TCA Cream or Ointment - on wound bed as well Wound Cleansing May shower with protection. - cast protector Primary Wound Dressing Wound #1R Left,Medial Lower Leg Calcium Alginate with Silver Secondary Dressing Wound #1R Left,Medial Lower Leg Dry Gauze Edema Control 3 Layer Compression System - Left Lower Extremity Avoid standing for long periods of time Elevate legs to the level of the heart or above for 30 minutes daily and/or when sitting, a frequency of: Exercise regularly Support Garment 20-30 mm/Hg pressure to: - juxtalite compression garment to right leg daily Electronic Signature(s) Signed: 08/12/2020 6:07:28 PM By: Baruch Gouty RN, BSN Signed: 08/12/2020 6:26:36 PM By: Worthy Keeler PA-C Entered By: Baruch Gouty on 08/12/2020 15:19:46 -------------------------------------------------------------------------------- Problem List Details Patient Name: Date of Service: Jackie [name], [place]. 08/12/2020 2:15 PM Medical Record Number: [id number] Patient Account  Number: [id number] Date of Birth/Sex: Treating RN: [birth year] (86 y.o. [name] Primary Care Provider: [name] Other Clinician: Referring Provider: Treating Provider/Extender: Terence Lux in Treatment: 3 Active Problems  ICD-10 Encounter Code Description Active Date MDM Diagnosis I87.332 Chronic venous hypertension (idiopathic) with ulcer and inflammation of left 07/17/2020 No Yes lower extremity L97.321 Non-pressure chronic ulcer of left ankle limited to breakdown of skin 07/17/2020 No Yes Inactive Problems Resolved Problems Electronic Signature(s) Signed: 08/12/2020 1:51:51 PM By: Worthy Keeler PA-C Entered By: Worthy Keeler on 08/12/2020 13:51:50 -------------------------------------------------------------------------------- Progress Note Details Patient Name: Date of Service: Jackie [name], [place]. 08/12/2020 2:15 PM Medical Record Number: [id number] Patient Account Number: [id number] Date of Birth/Sex: Treating RN: Aug 19, 1932 (86 y.o. [name] Primary Care Provider: [name] Other Clinician: Referring Provider: Treating Provider/Extender: Terence Lux in Treatment: 3 Subjective Chief Complaint Information obtained from Patient 07/17/2020; patient is here for review of a wound on her left medial ankle and lower leg History of Present Illness (HPI) ADMISSION 07/17/2020 This is an 86 year old woman who is referred by her nurse practitioner at [facility] who has been doing treatment for the wound on the right medial lower leg and ankle. Apparently 1 point a substantial wound however it is largely epithelialized now. Patient states its been there for about 5 or 6 weeks. On the background she has changes in the skin in this area I think the go back several years to when she had a left hip fracture. Nevertheless she does not have a wound history that I could elicit. As far as I know  that they had been using TCA, Mepitel under an Unna boot they have been changing this weekly at [facility]. She also received a course of doxycycline. Past medical history very little that I can find in [place] link. She has a history of venous insufficiency bilaterally and a left hip fracture hypertension and a history of thyroidectomy for thyroid cancer. ABI in our clinic was 1.13 on the left 8/27; the patient is substantial initial wound by her description on the left medial lower leg and ankle is completely epithelialized. She has chronic venous insufficiency, dermatosclerosis and probably lymphedema. She does not have stockings and states that she would have trouble getting over the toe stockings on in any fashion because of hip problems except 9/3; this is a patient with chronic venous insufficiency with a particularly difficult area on her left medial lower leg and ankle. My assessment of this is that she has chronic venous insufficiency and chronic venous hypertension. And at least on the left medial ankle stasis dermatitis. The area still looks angry and inflamed. Nevertheless it is epithelialized. I do not believe there is active infection. We have ordered her bilateral juxta lite stockings. These should be easier for her to put on but need to be adjusted to 30/40 compression. If this is not successful in maintaining this area I think she is going to need formal reflux studies to see if she might be a candidate for ablation of the greater saphenous vein 08/05/2020 upon evaluation today patient appears to be doing poorly in regard to her lower extremity. Unfortunately this has reopened after being healed on Friday. She tells me she was wearing her compression wrap but that even when she woke up in the morning on Saturday morning she was leaking. Fortunately there is no signs of active infection at this time systemically though there is some question of a thing locally. She  does have juxta light compression bilaterally. 08/12/2020 on evaluation today patient appears to be doing well with regard to her leg ulcer which actually  is closed again at this point. With that being said she is still having some issues currently with very thin skin that has come back over and again with her swelling if we do not wrap around afraid this is can open right back up. Nonetheless I do believe that she should proceed with the vascular testing next Thursday as previously ordered and recommended. Fortunately there is no signs of active infection at this time. Objective Constitutional Well-nourished and well-hydrated in no acute distress. Vitals Time Taken: 2:33 PM, Height: 61 in, Weight: 130 lbs, BMI: 24.6, Temperature: 97.6 F, Pulse: 64 bpm, Respiratory Rate: 16 breaths/min, Blood Pressure: 159/74 mmHg. Respiratory normal breathing without difficulty. Psychiatric this patient is able to make decisions and demonstrates good insight into disease process. Alert and Oriented x 3. pleasant and cooperative. General Notes: Upon inspection patient's wound again showed signs of being healed I do not see any evidence of opening at this point to be honest although there may be a small point 1 area obviously we see where there was some weeping but right now it appears to be doing okay. I would keep this open just to monitor but nonetheless hopefully this will be completely closed and obvious by next week. Integumentary (Hair, Skin) Wound #1R status is Open. Original cause of wound was Gradually Appeared. The wound is located on the Left,Medial Lower Leg. The wound measures 3cm length x 3.5cm width x 0.1cm depth; 8.247cm^2 area and 0.825cm^3 volume. There is Fat Layer (Subcutaneous Tissue) exposed. There is no tunneling or undermining noted. There is a medium amount of serous drainage noted. The wound margin is indistinct and nonvisible. There is large (67-100%) pink, pale granulation within  the wound bed. There is no necrotic tissue within the wound bed. Assessment Active Problems ICD-10 Chronic venous hypertension (idiopathic) with ulcer and inflammation of left lower extremity Non-pressure chronic ulcer of left ankle limited to breakdown of skin Procedures Wound #1R Pre-procedure diagnosis of Wound #1R is a Venous Leg Ulcer located on the Left,Medial Lower Leg . There was a Three Layer Compression Therapy Procedure by Carlene Coria, RN. Post procedure Diagnosis Wound #1R: Same as Pre-Procedure Plan Follow-up Appointments: Return Appointment in 1 week. Dressing Change Frequency: Do not change entire dressing for one week. Skin Barriers/Peri-Wound Care: Moisturizing lotion - lotion to leg TCA Cream or Ointment - on wound bed as well Wound Cleansing: May shower with protection. - cast protector Primary Wound Dressing: Wound #1R Left,Medial Lower Leg: Calcium Alginate with Silver Secondary Dressing: Wound #1R Left,Medial Lower Leg: Dry Gauze Edema Control: 3 Layer Compression System - Left Lower Extremity Avoid standing for long periods of time Elevate legs to the level of the heart or above for 30 minutes daily and/or when sitting, a frequency of: Exercise regularly Support Garment 20-30 mm/Hg pressure to: - juxtalite compression garment to right leg daily 1. I am going to suggest that we continue with the wound care measures as before and the patient is in agreement with the plan. This includes silver alginate just in case there is anything draining to keep it nice and dry other than that we will continue as well with a 3 layer compression wrap. 2. I am also can recommend patient continue to monitor for any signs of worsening infection I think that at this time the infection does not seem to be present and overall I think she just really needs to work on good edema control. I think vascular could be helpful in this regard.  3. I did recommend she keep her appointment  with vascular for next Thursday. We will see patient back for reevaluation in 1 week here in the clinic. If anything worsens or changes patient will contact our office for additional recommendations. Electronic Signature(s) Signed: 08/12/2020 3:21:51 PM By: Worthy Keeler PA-C Entered By: Worthy Keeler on 08/12/2020 15:21:51 -------------------------------------------------------------------------------- SuperBill Details Patient Name: Date of Service: Jackie [name], [place]. 08/12/2020 Medical Record Number: [id number] Patient Account Number: [id number] Date of Birth/Sex: Treating RN: September 02, 1932 (86 y.o. [name] Primary Care Provider: [name] Other Clinician: Referring Provider: Treating Provider/Extender: Terence Lux in Treatment: 3 Diagnosis Coding ICD-10 Codes Code Description [phone] Chronic venous hypertension (idiopathic) with ulcer and inflammation of left lower extremity L97.321 Non-pressure chronic ulcer of left ankle limited to breakdown of skin Facility Procedures CPT4 Code: [id number] Description: (Facility Use Only) [phone] - [place] LWR LT LEG Modifier: Quantity: 1 Physician Procedures : CPT4 Code Description Modifier [id number] - WC PHYS LEVEL 3 - EST PT ICD-10 Diagnosis Description I87.332 Chronic venous hypertension (idiopathic) with ulcer and inflammation of left lower extremity L97.321 Non-pressure chronic ulcer of left ankle  limited to breakdown of skin Quantity: 1 Electronic Signature(s) Signed: 08/12/2020 3:23:43 PM By: Worthy Keeler PA-C Entered By: Worthy Keeler on 08/12/2020 15:23:43

## 2020-08-13 NOTE — Progress Notes (Signed)
SHARA, HARTIS Viburnum. ([id number]) Visit Report for 08/12/2020 Arrival Information Details Patient Name: Date of Service: [name] CE M. 08/12/2020 2:15 PM Medical Record Number: [id number] Patient Account Number: [id number] Date of Birth/Sex: Treating RN: 06-03-1932 (86 y.o. [name] Primary Care Said Rueb: [name] Other Clinician: Referring Revia Nghiem: Treating Joshuan Bolander/Extender: Terence Lux in Treatment: 3 Visit Information History Since Last Visit Added or deleted any medications: No Patient Arrived: [name] Any new allergies or adverse reactions: No Arrival Time: 14:22 Had a fall or experienced change in No Accompanied By: self activities of daily living that may affect Transfer Assistance: None risk of falls: Patient Identification Verified: Yes Signs or symptoms of abuse/neglect since last visito No Secondary Verification Process Completed: Yes Hospitalized since last visit: No Implantable device outside of the clinic excluding No cellular tissue based products placed in the center since last visit: Has Dressing in Place as Prescribed: Yes Pain Present Now: No Electronic Signature(s) Signed: 08/13/2020 11:12:08 AM By: Sandre Kitty Entered By: Sandre Kitty on 08/12/2020 14:23:46 -------------------------------------------------------------------------------- Compression Therapy Details Patient Name: Date of Service: [name], [place]. 08/12/2020 2:15 PM Medical Record Number: [id number] Patient Account Number: [id number] Date of Birth/Sex: Treating RN: [birth year] (86 y.o. [name] Primary Care Ahmere Hemenway: [name] Other Clinician: Referring Lucelia Lacey: Treating Shawana Knoch/Extender: Terence Lux in Treatment: 3 Compression Therapy Performed for Wound Assessment: Wound #1R Left,Medial Lower Leg Performed By: Clinician Carlene Coria, RN Compression Type: Three Layer Post Procedure  Diagnosis Same as Pre-procedure Electronic Signature(s) Signed: 08/12/2020 6:07:28 PM By: Baruch Gouty RN, BSN Entered By: Baruch Gouty on 08/12/2020 15:18:57 -------------------------------------------------------------------------------- Encounter Discharge Information Details Patient Name: Date of Service: [name], [place]. 08/12/2020 2:15 PM Medical Record Number: [id number] Patient Account Number: [id number] Date of Birth/Sex: Treating RN: 12-04-1931 (86 y.o. [name] Primary Care Cosima Prentiss: [name] Other Clinician: Referring Maxon Kresse: Treating Guillermo Nehring/Extender: Terence Lux in Treatment: 3 Encounter Discharge Information Items Discharge Condition: Stable Ambulatory Status: Cane Discharge Destination: Home Transportation: Private Auto Accompanied By: self Schedule Follow-up Appointment: Yes Clinical Summary of Care: Electronic Signature(s) Signed: 08/12/2020 5:18:19 PM By: Deon Pilling Entered By: Deon Pilling on 08/12/2020 15:32:57 -------------------------------------------------------------------------------- Lower Extremity Assessment Details Patient Name: Date of Service: [name], [place]. 08/12/2020 2:15 PM Medical Record Number: [id number] Patient Account Number: [id number] Date of Birth/Sex: Treating RN: July 14, 1932 (86 y.o. [name] Primary Care Ike Maragh: [name] Other Clinician: Referring Kayce Chismar: Treating Camdyn Beske/Extender: Terence Lux in Treatment: 3 Edema Assessment Assessed: [Left: Yes] [Right: No] Edema: [Left: Ye] [Right: s] Calf Left: Right: Point of Measurement: 38 cm From Medial Instep 38 cm cm Ankle Left: Right: Point of Measurement: 13 cm From Medial Instep 21 cm cm Vascular Assessment Pulses: Dorsalis Pedis Palpable: [Left:Yes] Electronic Signature(s) Signed: 08/12/2020 5:18:19 PM By: Deon Pilling Entered By: Deon Pilling on 08/12/2020  14:35:21 -------------------------------------------------------------------------------- Multi-Disciplinary Care Plan Details Patient Name: Date of Service: [name], [place]. 08/12/2020 2:15 PM Medical Record Number: [id number] Patient Account Number: [id number] Date of Birth/Sex: Treating RN: 06/26/1932 (86 y.o. [name] Primary Care Harbour Nordmeyer: [name] Other Clinician: Referring Frayda Egley: Treating Arienne Gartin/Extender: Terence Lux in Treatment: 3 Active Inactive Venous Leg Ulcer Nursing Diagnoses: Knowledge deficit related to disease process and management Goals: Patient will maintain optimal edema control Date Initiated: 08/05/2020 Target Resolution Date: 09/02/2020 Goal Status: Active Patient/caregiver will  verbalize understanding of disease process and disease management Date Initiated: 07/17/2020 Date Inactivated: 07/31/2020 Target Resolution Date: 08/21/2020 Goal Status: Met Interventions: Compression as ordered Provide education on venous insufficiency Notes: Wound/Skin Impairment Nursing Diagnoses: Impaired tissue integrity Knowledge deficit related to ulceration/compromised skin integrity Goals: Patient/caregiver will verbalize understanding of skin care regimen Date Initiated: 08/05/2020 Target Resolution Date: 09/02/2020 Goal Status: Active Ulcer/skin breakdown will have a volume reduction of 30% by week 4 Date Initiated: 07/17/2020 Target Resolution Date: 09/02/2020 Goal Status: Active Interventions: Provide education on ulcer and skin care Notes: Electronic Signature(s) Signed: 08/12/2020 6:07:28 PM By: Baruch Gouty RN, BSN Entered By: Baruch Gouty on 08/12/2020 15:17:23 -------------------------------------------------------------------------------- Pain Assessment Details Patient Name: Date of Service: [name], [place] M. 08/12/2020 2:15 PM Medical Record Number: [id number] Patient Account Number: [id number] Date  of Birth/Sex: Treating RN: 1931/12/03 (86 y.o. [name] Primary Care Daisie Haft: [name] Other Clinician: Referring Diandre Merica: Treating Betzaira Mentel/Extender: Terence Lux in Treatment: 3 Active Problems Location of Pain Severity and Description of Pain Patient Has Paino No Site Locations Pain Management and Medication Current Pain Management: Electronic Signature(s) Signed: 08/12/2020 6:07:28 PM By: Baruch Gouty RN, BSN Signed: 08/13/2020 11:12:08 AM By: Sandre Kitty Entered By: Sandre Kitty on 08/12/2020 14:24:09 -------------------------------------------------------------------------------- Patient/Caregiver Education Details Patient Name: Date of Service: [name], [place]. 9/15/2021andnbsp2:15 PM Medical Record Number: [id number] Patient Account Number: [id number] Date of Birth/Gender: Treating RN: 1932-06-23 (86 y.o. [name] Primary Care Physician: [name] Other Clinician: Referring Physician: Treating Physician/Extender: Terence Lux in Treatment: 3 Education Assessment Education Provided To: Patient Education Topics Provided Venous: Methods: Explain/Verbal Responses: Reinforcements needed, State content correctly Wound/Skin Impairment: Methods: Explain/Verbal Responses: Reinforcements needed, State content correctly Electronic Signature(s) Signed: 08/12/2020 6:07:28 PM By: Baruch Gouty RN, BSN Entered By: Baruch Gouty on 08/12/2020 15:18:37 -------------------------------------------------------------------------------- Wound Assessment Details Patient Name: Date of Service: [name], [place]. 08/12/2020 2:15 PM Medical Record Number: [id number] Patient Account Number: [id number] Date of Birth/Sex: Treating RN: [birth year] (86 y.o. [name], [name] Primary Care Malayasia Mirkin: [name] Other Clinician: Referring Saniyyah Elster: Treating Tyan Dy/Extender: Terence Lux in Treatment: 3 Wound Status Wound Number: 1R Primary Etiology: Venous Leg Ulcer Wound Location: Left, Medial Lower Leg Wound Status: Open Wounding Event: Gradually Appeared Comorbid History: Hypertension Date Acquired: 05/28/2020 Weeks Of Treatment: 3 Clustered Wound: No Wound Measurements Length: (cm) 3 Width: (cm) 3.5 Depth: (cm) 0.1 Area: (cm) 8.247 Volume: (cm) 0.825 % Reduction in Area: 70.8% % Reduction in Volume: 70.8% Epithelialization: Small (1-33%) Tunneling: No Undermining: No Wound Description Classification: Full Thickness Without Exposed Support Structures Wound Margin: Indistinct, nonvisible Exudate Amount: Medium Exudate Type: Serous Exudate Color: amber Foul Odor After Cleansing: No Slough/Fibrino No Wound Bed Granulation Amount: Large (67-100%) Exposed Structure Granulation Quality: Pink, Pale Fascia Exposed: No Necrotic Amount: None Present (0%) Fat Layer (Subcutaneous Tissue) Exposed: Yes Tendon Exposed: No Muscle Exposed: No Joint Exposed: No Bone Exposed: No Treatment Notes Wound #1R (Left, Medial Lower Leg) 1. Cleanse With Wound Cleanser Soap and water 2. Periwound Care Moisturizing lotion TCA Cream 3. Primary Dressing Applied Calcium Alginate Ag 4. Secondary Dressing Dry Gauze 6. Support Layer Applied 3 layer compression wrap Notes netting Electronic Signature(s) Signed: 08/12/2020 5:18:19 PM By: Deon Pilling Entered By: Deon Pilling on 08/12/2020 14:36:52 -------------------------------------------------------------------------------- Vitals Details Patient Name: Date of Service: [name], [place]. 08/12/2020 2:15 PM Medical Record Number: [id number] Patient Account  Number: [id number] Date of Birth/Sex: Treating RN: 29-May-1932 (86 y.o. [name] Primary Care [place] Zou: [name] Other Clinician: Referring Adiel Erney: Treating Alisia Vanengen/Extender: Terence Lux  in Treatment: 3 Vital Signs Time Taken: 14:33 Temperature (F): 97.6 Height (in): 61 Pulse (bpm): 64 Weight (lbs): 130 Respiratory Rate (breaths/min): 16 Body Mass Index (BMI): 24.6 Blood Pressure (mmHg): 159/74 Reference Range: 80 - 120 mg / dl Electronic Signature(s) Signed: 08/13/2020 11:12:08 AM By: Sandre Kitty Entered By: Sandre Kitty on 08/12/2020 14:24:03

## 2020-08-19 ENCOUNTER — Other Ambulatory Visit: Payer: Medicare Other

## 2020-08-19 ENCOUNTER — Encounter (HOSPITAL_BASED_OUTPATIENT_CLINIC_OR_DEPARTMENT_OTHER): Payer: Medicare Other | Admitting: Physician Assistant

## 2020-08-20 ENCOUNTER — Encounter (HOSPITAL_BASED_OUTPATIENT_CLINIC_OR_DEPARTMENT_OTHER): Payer: Medicare Other | Admitting: Internal Medicine

## 2020-08-20 ENCOUNTER — Other Ambulatory Visit: Payer: Self-pay

## 2020-08-20 DIAGNOSIS — I87332 Chronic venous hypertension (idiopathic) with ulcer and inflammation of left lower extremity: Secondary | ICD-10-CM | POA: Diagnosis not present

## 2020-08-21 NOTE — Progress Notes (Signed)
NAN, MAYA Hettick. ([id number]) Visit Report for 08/20/2020 Arrival Information Details Patient Name: Date of Service: Jackie [name] CE M. 08/20/2020 2:30 PM Medical Record Number: [id number] Patient Account Number: [id number] Date of Birth/Sex: Treating RN: 12/18/1931 (86 y.o. Jackie [name] Primary Care Jackie [name]: Jackie [name] Other Clinician: Referring Jackie [name]: Treating Jackie [name]/Extender: Jackie [name] in Treatment: 4 Visit Information History Since Last Visit Added or deleted any medications: No Patient Arrived: Jackie [name] Any new allergies or adverse reactions: No Arrival Time: 14:35 Had a fall or experienced change in No Accompanied By: self activities of daily living that may affect Transfer Assistance: None risk of falls: Patient Identification Verified: Yes Signs or symptoms of abuse/neglect since last visito No Secondary Verification Process Completed: Yes Hospitalized since last visit: No Implantable device outside of the clinic excluding No cellular tissue based products placed in the center since last visit: Has Dressing in Place as Prescribed: Yes Pain Present Now: No Electronic Signature(s) Signed: 08/21/2020 3:01:17 PM By: Sandre Kitty Entered By: Sandre Kitty on 08/20/2020 14:42:25 -------------------------------------------------------------------------------- Clinic Level of Care Assessment Details Patient Name: Date of Service: Jackie [name] CE M. 08/20/2020 2:30 PM Medical Record Number: [id number] Patient Account Number: [id number] Date of Birth/Sex: Treating RN: May 09, 1932 (86 y.o. Jackie [name] Primary Care Jalia Zuniga: Jackie [name] Other Clinician: Referring Jackie [name]: Treating Jackie [name]/Extender: Jackie [name] in Treatment: 4 Clinic Level of Care Assessment Items TOOL 4 Quantity Score X- 1 0 Use when only an EandM is performed on FOLLOW-UP visit ASSESSMENTS - Nursing Assessment / Reassessment X- 1  10 Reassessment of Co-morbidities (includes updates in patient status) X- 1 5 Reassessment of Adherence to Treatment Plan ASSESSMENTS - Wound and Skin A ssessment / Reassessment X - Simple Wound Assessment / Reassessment - one wound 1 5 []  - 0 Complex Wound Assessment / Reassessment - multiple wounds X- 1 10 Dermatologic / Skin Assessment (not related to wound area) ASSESSMENTS - Focused Assessment X- 1 5 Circumferential Edema Measurements - multi extremities X- 1 10 Nutritional Assessment / Counseling / Intervention []  - 0 Lower Extremity Assessment (monofilament, tuning fork, pulses) []  - 0 Peripheral Arterial Disease Assessment (using hand held doppler) ASSESSMENTS - Ostomy and/or Continence Assessment and Care []  - 0 Incontinence Assessment and Management []  - 0 Ostomy Care Assessment and Management (repouching, etc.) PROCESS - Coordination of Care X - Simple Patient / Family Education for ongoing care 1 15 []  - 0 Complex (extensive) Patient / Family Education for ongoing care X- 1 10 Staff obtains Programmer, systems, Records, T Results / Process Orders est []  - 0 Staff telephones HHA, Nursing Homes / Clarify orders / etc []  - 0 Routine Transfer to another Facility (non-emergent condition) []  - 0 Routine Hospital Admission (non-emergent condition) []  - 0 New Admissions / Biomedical engineer / Ordering NPWT Apligraf, etc. , []  - 0 Emergency Hospital Admission (emergent condition) X- 1 10 Simple Discharge Coordination []  - 0 Complex (extensive) Discharge Coordination PROCESS - Special Needs []  - 0 Pediatric / Minor Patient Management []  - 0 Isolation Patient Management []  - 0 Hearing / Language / Visual special needs []  - 0 Assessment of Community assistance (transportation, D/C planning, etc.) []  - 0 Additional assistance / Altered mentation []  - 0 Support Surface(s) Assessment (bed, cushion, seat, etc.) INTERVENTIONS - Wound Cleansing / Measurement X -  Simple Wound Cleansing - one wound 1 5 []  - 0 Complex Wound Cleansing - multiple wounds X- 1 5 Wound Imaging (photographs -  any number of wounds) []  - 0 Wound Tracing (instead of photographs) X- 1 5 Simple Wound Measurement - one wound []  - 0 Complex Wound Measurement - multiple wounds INTERVENTIONS - Wound Dressings []  - 0 Small Wound Dressing one or multiple wounds []  - 0 Medium Wound Dressing one or multiple wounds []  - 0 Large Wound Dressing one or multiple wounds []  - 0 Application of Medications - topical []  - 0 Application of Medications - injection INTERVENTIONS - Miscellaneous []  - 0 External ear exam []  - 0 Specimen Collection (cultures, biopsies, blood, body fluids, etc.) []  - 0 Specimen(s) / Culture(s) sent or taken to Lab for analysis []  - 0 Patient Transfer (multiple staff / Civil Service fast streamer / Similar devices) []  - 0 Simple Staple / Suture removal (25 or less) []  - 0 Complex Staple / Suture removal (26 or more) []  - 0 Hypo / Hyperglycemic Management (close monitor of Blood Glucose) []  - 0 Ankle / Brachial Index (ABI) - do not check if billed separately X- 1 5 Vital Signs Has the patient been seen at the hospital within the last three years: Yes Total Score: 100 Level Of Care: New/Established - Level 3 Electronic Signature(s) Signed: 08/20/2020 4:42:44 PM By: Deon Pilling Entered By: Deon Pilling on 08/20/2020 15:16:20 -------------------------------------------------------------------------------- Encounter Discharge Information Details Patient Name: Date of Service: Jackie [name], [name]. 08/20/2020 2:30 PM Medical Record Number: [id number] Patient Account Number: [id number] Date of Birth/Sex: Treating RN: Sep 24, 1932 (87 y.o. Jackie [name] Primary Care Jackie [name]: Jackie [name] Other Clinician: Referring Jackie [name]: Treating Jackie [name]/Extender: Jackie [name] in Treatment: 4 Encounter Discharge Information Items Discharge Condition:  Stable Ambulatory Status: Cane Discharge Destination: Home Transportation: Private Auto Accompanied By: self Schedule Follow-up Appointment: No Clinical Summary of Care: Electronic Signature(s) Signed: 08/20/2020 4:42:44 PM By: Deon Pilling Entered By: Deon Pilling on 08/20/2020 15:22:46 -------------------------------------------------------------------------------- Lower Extremity Assessment Details Patient Name: Date of Service: Jackie [name], [place]. 08/20/2020 2:30 PM Medical Record Number: [id number] Patient Account Number: [id number] Date of Birth/Sex: Treating RN: [birth year] (86 y.o. [name] Primary Care Dane Kopke: Jackie [name] Other Clinician: Referring Scarleth Brame: Treating Taunja Brickner/Extender: Jackie [name] in Treatment: 4 Edema Assessment Assessed: [name]: No] [name]: No] Edema: [Left: Ye] [Right: s] Calf Left: Right: Point of Measurement: 38 cm From Medial Instep 31.5 cm cm Ankle Left: Right: Point of Measurement: 13 cm From Medial Instep 20 cm cm Vascular Assessment Pulses: Dorsalis Pedis Palpable: [Left:Yes] Electronic Signature(s) Signed: 08/20/2020 4:54:50 PM By: Levan Hurst RN, BSN Entered By: Levan Hurst on 08/20/2020 14:56:06 -------------------------------------------------------------------------------- [place] Details Patient Name: Date of Service: Jackie [name], [place]. 08/20/2020 2:30 PM Medical Record Number: [id number] Patient Account Number: [id number] Date of Birth/Sex: Treating RN: [birth year] (86 y.o. Jackie [name] Primary Care Rio Taber: Jackie [name] Other Clinician: Referring Caro Brundidge: Treating Aundria Bitterman/Extender: Jackie [name] in Treatment: 4 Active Inactive Electronic Signature(s) Signed: 08/20/2020 4:42:44 PM By: Deon Pilling Entered By: Deon Pilling on 08/20/2020  15:15:47 -------------------------------------------------------------------------------- Pain Assessment Details Patient Name: Date of Service: Jackie [name], [place]. 08/20/2020 2:30 PM Medical Record Number: [id number] Patient Account Number: [id number] Date of Birth/Sex: Treating RN: 18-Apr-1932 (86 y.o. Jackie [name] Primary Care Albertha Beattie: Jackie [name] Other Clinician: Referring Dannette Kinkaid: Treating Yuritza Paulhus/Extender: Jackie [name] in Treatment: 4 Active Problems Location of Pain Severity and Description of Pain Patient Has Paino No Site Locations Pain Management and Medication Current Pain Management: Electronic Signature(s)  Signed: 08/20/2020 4:42:44 PM By: Deon Pilling Signed: 08/21/2020 3:01:17 PM By: Sandre Kitty Entered By: Sandre Kitty on 08/20/2020 14:42:52 -------------------------------------------------------------------------------- Patient/Caregiver Education Details Patient Name: Date of Service: Jackie [name], [place]. 9/23/2021andnbsp2:30 PM Medical Record Number: [id number] Patient Account Number: [id number] Date of Birth/Gender: Treating RN: 12-Feb-1932 (86 y.o. Jackie [name] Primary Care Physician: Jackie [name] Other Clinician: Referring Physician: Treating Physician/Extender: Jackie [name] in Treatment: 4 Education Assessment Education Provided To: Patient and Caregiver Education Topics Provided Venous: Handouts: Managing Venous Disease and Related Ulcers Methods: Explain/Verbal Responses: Reinforcements needed Electronic Signature(s) Signed: 08/20/2020 4:42:44 PM By: Deon Pilling Entered By: Deon Pilling on 08/20/2020 14:34:13 -------------------------------------------------------------------------------- Wound Assessment Details Patient Name: Date of Service: Jackie [name], [place]. 08/20/2020 2:30 PM Medical Record Number: [id number] Patient Account Number: [id number] Date of  Birth/Sex: Treating RN: 1932/07/25 (86 y.o. Jackie [name] Primary Care Renley Banwart: Jackie [name] Other Clinician: Referring Raenette Sakata: Treating Damond Borchers/Extender: Jackie [name] in Treatment: 4 Wound Status Wound Number: 1R Primary Etiology: Venous Leg Ulcer Wound Location: Left, Medial Lower Leg Wound Status: Open Wounding Event: Gradually Appeared Comorbid History: Hypertension Date Acquired: 05/28/2020 Weeks Of Treatment: 4 Clustered Wound: No Photos Photo Uploaded By: Mikeal Hawthorne on 08/21/2020 10:29:17 Wound Measurements Length: (cm) Width: (cm) Depth: (cm) Area: (cm) Volume: (cm) 0 % Reduction in Area: 100% 0 % Reduction in Volume: 100% 0 Epithelialization: Large (67-100%) 0 Tunneling: No 0 Undermining: No Wound Description Classification: Full Thickness Without Exposed Support Structures Wound Margin: Indistinct, nonvisible Exudate Amount: None Present Foul Odor After Cleansing: No Slough/Fibrino No Wound Bed Granulation Amount: None Present (0%) Exposed Structure Necrotic Amount: None Present (0%) Fascia Exposed: No Fat Layer (Subcutaneous Tissue) Exposed: No Tendon Exposed: No Muscle Exposed: No Joint Exposed: No Bone Exposed: No Electronic Signature(s) Signed: 08/20/2020 4:42:44 PM By: Deon Pilling Signed: 08/20/2020 4:54:50 PM By: Levan Hurst RN, BSN Entered By: Levan Hurst on 08/20/2020 14:55:27 -------------------------------------------------------------------------------- [place] Details Patient Name: Date of Service: Jackie [name], [place]. 08/20/2020 2:30 PM Medical Record Number: [id number] Patient Account Number: [id number] Date of Birth/Sex: Treating RN: 1932/10/08 (86 y.o. Jackie [name] Primary Care Bari Leib: Jackie [name] Other Clinician: Referring Stokes Rattigan: Treating Pebble Botkin/Extender: Jackie [name] in Treatment: 4 Vital Signs Time Taken: 14:42 Temperature (F): 97.9 Height (in):  61 Pulse (bpm): 69 Weight (lbs): 130 Respiratory Rate (breaths/min): 16 Body Mass Index (BMI): 24.6 Blood Pressure (mmHg): 171/78 Reference Range: 80 - 120 mg / dl Electronic Signature(s) Signed: 08/21/2020 3:01:17 PM By: Sandre Kitty Entered By: Sandre Kitty on 08/20/2020 14:42:42

## 2020-08-23 NOTE — Progress Notes (Signed)
Jackie [name] Gravity. ([id number]) Visit Report for 08/20/2020 HPI Details Patient Name: Date of Service: Jackie [name] CE M. 08/20/2020 2:30 PM Medical Record Number: [id number] Patient Account Number: [id number] Date of Birth/Sex: Treating RN: 11-Oct-1932 (86 y.o. Jackie [name] Primary Care Provider: Reynold [name] Other Clinician: Referring Provider: Treating Provider/Extender: Jackie [name] in Treatment: 4 History of Present Illness HPI Description: ADMISSION 07/17/2020 This is an 86 year old woman who is referred by her nurse practitioner at [facility] who has been doing treatment for the wound on the right medial lower leg and ankle. Apparently 1 point a substantial wound however it is largely epithelialized now. Patient states its been there for about 5 or 6 weeks. On the background she has changes in the skin in this area I think the go back several years to when she had a left hip fracture. Nevertheless she does not have a wound history that I could elicit. As far as I know that they had been using TCA, Mepitel under an Unna boot they have been changing this weekly at [employer]. She also received a course of doxycycline. Past medical history very little that I can find in [place] link. She has a history of venous insufficiency bilaterally and a left hip fracture hypertension and a history of thyroidectomy for thyroid cancer. ABI in our clinic was 1.13 on the left 8/27; the patient is substantial initial wound by her description on the left medial lower leg and ankle is completely epithelialized. She has chronic venous insufficiency, dermatosclerosis and probably lymphedema. She does not have stockings and states that she would have trouble getting over the toe stockings on in any fashion because of hip problems except 9/3; this is a patient with chronic venous insufficiency with a particularly difficult area on her left medial  lower leg and ankle. My assessment of this is that she has chronic venous insufficiency and chronic venous hypertension. And at least on the left medial ankle stasis dermatitis. The area still looks angry and inflamed. Nevertheless it is epithelialized. I do not believe there is active infection. We have ordered her bilateral juxta lite stockings. These should be easier for her to put on but need to be adjusted to 30/40 compression. If this is not successful in maintaining this area I think she is going to need formal reflux studies to see if she might be a candidate for ablation of the greater saphenous vein 08/05/2020 upon evaluation today patient appears to be doing poorly in regard to her lower extremity. Unfortunately this has reopened after being healed on Friday. She tells me she was wearing her compression wrap but that even when she woke up in the morning on Saturday morning she was leaking. Fortunately there is no signs of active infection at this time systemically though there is some question of a thing locally. She does have juxta light compression bilaterally. 08/12/2020 on evaluation today patient appears to be doing well with regard to her leg ulcer which actually is closed again at this point. With that being said she is still having some issues currently with very thin skin that has come back over and again with her swelling if we do not wrap around afraid this is can open right back up. Nonetheless I do believe that she should proceed with the vascular testing next Thursday as previously ordered and recommended. Fortunately there is no signs of active infection at this time. 9/23; this is a patient I  discharged a few weeks ago. Apparently she had a reopening on the left leg medially. This is closed now. We have ordered venous reflux studies but they are not until 10/5 at [facility] imaging [employer]) Signed: 08/23/2020 6:30:39 AM By: Linton Ham MD Entered By:  Linton Ham on 08/20/2020 15:51:17 -------------------------------------------------------------------------------- Physical Exam Details Patient Name: Date of Service: Jackie [name], Jackie [name]. 08/20/2020 2:30 PM Medical Record Number: [id number] Patient Account Number: [id number] Date of Birth/Sex: Treating RN: 1932/04/16 (86 y.o. Jackie [name] Primary Care Provider: Reynold [name] Other Clinician: Referring Provider: Treating Provider/Extender: Jackie [name] in Treatment: 4 Constitutional Patient is hypertensive.. Pulse regular and within target range for patient.Marland Kitchen Respirations regular, non-labored and within target range.. Temperature is normal and within the target range for the patient.Marland Kitchen Appears in no distress. Cardiovascular Needle pulses are palpable on the left. Notes Wound exam; the patient has no open wounds. She has dry flaky fibrosed skin in the left medial lower leg especially. Lipodermatosclerosis. Her edema is well controlled Electronic Signature(s) Signed: 08/23/2020 6:30:39 AM By: Linton Ham MD Entered By: Linton Ham on 08/20/2020 15:52:29 -------------------------------------------------------------------------------- Physician Orders Details Patient Name: Date of Service: Jackie [name], Jackie [name]. 08/20/2020 2:30 PM Medical Record Number: [id number] Patient Account Number: [id number] Date of Birth/Sex: Treating RN: 1932-01-08 (86 y.o. Jackie [name] Primary Care Provider: Reynold [name] Other Clinician: Referring Provider: Treating Provider/Extender: Jackie [name] in Treatment: 4 Verbal / Phone Orders: No Diagnosis Coding ICD-10 Coding Code Description [phone] Chronic venous hypertension (idiopathic) with ulcer and inflammation of left lower extremity L97.321 Non-pressure chronic ulcer of left ankle limited to breakdown of skin Discharge From [facility] Discharge from [place] - call if any future  wound care needs. Skin Barriers/Peri-Wound Care Moisturizing lotion - liberally to both legs daily. Edema Control Avoid standing for long periods of time Elevate legs to the level of the heart or above for 30 minutes daily and/or when sitting, a frequency of: - 3-4 times a day throughout the day. Exercise regularly Support Garment 20-30 mm/Hg pressure to: - patient to apply and wear juxtalites to bilateral lower legs. apply in the morning and remove at night. Additional Orders / Instructions Other: - Patient to keep schedule venous reflux studies on 09/01/2020 with [facility]. Electronic Signature(s) Signed: 08/20/2020 4:42:44 PM By: Deon Pilling Signed: 08/23/2020 6:30:39 AM By: Linton Ham MD Entered By: Deon Pilling on 08/20/2020 15:15:07 -------------------------------------------------------------------------------- Problem List Details Patient Name: Date of Service: Jackie [name], [place] M. 08/20/2020 2:30 PM Medical Record Number: [id number] Patient Account Number: [id number] Date of Birth/Sex: Treating RN: [birth year] (86 y.o. Jackie [name] Primary Care Provider: Reynold [name] Other Clinician: Referring Provider: Treating Provider/Extender: Jackie [name] in Treatment: 4 Active Problems ICD-10 Encounter Code Description Active Date MDM Diagnosis I87.332 Chronic venous hypertension (idiopathic) with ulcer and inflammation of left 07/17/2020 No Yes lower extremity L97.321 Non-pressure chronic ulcer of left ankle limited to breakdown of skin 07/17/2020 No Yes Inactive Problems Resolved Problems Electronic Signature(s) Signed: 08/23/2020 6:30:39 AM By: Linton Ham MD Entered By: Linton Ham on 08/20/2020 15:50:00 -------------------------------------------------------------------------------- Progress Note Details Patient Name: Date of Service: Jackie [name], [place]. 08/20/2020 2:30 PM Medical Record Number: [id number] Patient Account  Number: [id number] Date of Birth/Sex: Treating RN: [birth year] (86 y.o. Jackie [name] Primary Care Provider: Reynold [name] Other Clinician: Referring Provider: Treating Provider/Extender: Jackie [name] in Treatment: 4 Subjective History of  Present Illness (HPI) ADMISSION 07/17/2020 This is an 86 year old woman who is referred by her nurse practitioner at [facility] who has been doing treatment for the wound on the right medial lower leg and ankle. Apparently 1 point a substantial wound however it is largely epithelialized now. Patient states its been there for about 5 or 6 weeks. On the background she has changes in the skin in this area I think the go back several years to when she had a left hip fracture. Nevertheless she does not have a wound history that I could elicit. As far as I know that they had been using TCA, Mepitel under an Unna boot they have been changing this weekly at [facility] [employer]. She also received a course of doxycycline. Past medical history very little that I can find in [place] link. She has a history of venous insufficiency bilaterally and a left hip fracture hypertension and a history of thyroidectomy for thyroid cancer. ABI in our clinic was 1.13 on the left 8/27; the patient is substantial initial wound by her description on the left medial lower leg and ankle is completely epithelialized. She has chronic venous insufficiency, dermatosclerosis and probably lymphedema. She does not have stockings and states that she would have trouble getting over the toe stockings on in any fashion because of hip problems except 9/3; this is a patient with chronic venous insufficiency with a particularly difficult area on her left medial lower leg and ankle. My assessment of this is that she has chronic venous insufficiency and chronic venous hypertension. And at least on the left medial ankle stasis dermatitis. The area still  looks angry and inflamed. Nevertheless it is epithelialized. I do not believe there is active infection. We have ordered her bilateral juxta lite stockings. These should be easier for her to put on but need to be adjusted to 30/40 compression. If this is not successful in maintaining this area I think she is going to need formal reflux studies to see if she might be a candidate for ablation of the greater saphenous vein 08/05/2020 upon evaluation today patient appears to be doing poorly in regard to her lower extremity. Unfortunately this has reopened after being healed on Friday. She tells me she was wearing her compression wrap but that even when she woke up in the morning on Saturday morning she was leaking. Fortunately there is no signs of active infection at this time systemically though there is some question of a thing locally. She does have juxta light compression bilaterally. 08/12/2020 on evaluation today patient appears to be doing well with regard to her leg ulcer which actually is closed again at this point. With that being said she is still having some issues currently with very thin skin that has come back over and again with her swelling if we do not wrap around afraid this is can open right back up. Nonetheless I do believe that she should proceed with the vascular testing next Thursday as previously ordered and recommended. Fortunately there is no signs of active infection at this time. 9/23; this is a patient I discharged a few weeks ago. Apparently she had a reopening on the left leg medially. This is closed now. We have ordered venous reflux studies but they are not until 10/5 at [place] [facility] Constitutional Patient is hypertensive.. Pulse regular and within target range for patient.Marland Kitchen Respirations regular, non-labored and within target range.. Temperature is normal and within the target range for the patient.Marland Kitchen  Appears in no distress. Vitals Time Taken: 2:42 PM,  Height: 61 in, Weight: 130 lbs, BMI: 24.6, Temperature: 97.9 F, Pulse: 69 bpm, Respiratory Rate: 16 breaths/min, Blood Pressure: 171/78 mmHg. Cardiovascular Needle pulses are palpable on the left. General Notes: Wound exam; the patient has no open wounds. She has dry flaky fibrosed skin in the left medial lower leg especially. Lipodermatosclerosis. Her edema is well controlled Integumentary (Hair, Skin) Wound #1R status is Open. Original cause of wound was Gradually Appeared. The wound is located on the Left,Medial Lower Leg. The wound measures 0cm length x 0cm width x 0cm depth; 0cm^2 area and 0cm^3 volume. There is no tunneling or undermining noted. There is a none present amount of drainage noted. The wound margin is indistinct and nonvisible. There is no granulation within the wound bed. There is no necrotic tissue within the wound bed. Assessment Active Problems ICD-10 Chronic venous hypertension (idiopathic) with ulcer and inflammation of left lower extremity Non-pressure chronic ulcer of left ankle limited to breakdown of skin Plan Discharge From [facility]: Discharge from [place] - call if any future wound care needs. Skin Barriers/Peri-Wound Care: Moisturizing lotion - liberally to both legs daily. Edema Control: Avoid standing for long periods of time Elevate legs to the level of the heart or above for 30 minutes daily and/or when sitting, a frequency of: - 3-4 times a day throughout the day. Exercise regularly Support Garment 20-30 mm/Hg pressure to: - patient to apply and wear juxtalites to bilateral lower legs. apply in the morning and remove at night. Additional Orders / Instructions: Other: - Patient to keep schedule venous reflux studies on 09/01/2020 with [facility]. 1. The patient can be discharged to her own juxta lite stockings on the left leg 2. Counseled to lotion the skin with moisturizing skin cream at least nightly 3. She has venous reflux  studies on 10/5 we will look at these and determine whether she needs a vascular surgery referral Electronic Signature(s) Signed: 08/23/2020 6:30:39 AM By: Linton Ham MD Entered By: Linton Ham on 08/20/2020 15:53:22 -------------------------------------------------------------------------------- SuperBill Details Patient Name: Date of Service: Jackie [name], Jackie [name]. 08/20/2020 Medical Record Number: [id number] Patient Account Number: [id number] Date of Birth/Sex: Treating RN: 1932/04/25 (86 y.o. Jackie [name] Primary Care Provider: Reynold [name] Other Clinician: Referring Provider: Treating Provider/Extender: Jackie [name] in Treatment: 4 Diagnosis Coding ICD-10 Codes Code Description [phone] Chronic venous hypertension (idiopathic) with ulcer and inflammation of left lower extremity L97.321 Non-pressure chronic ulcer of left ankle limited to breakdown of skin Facility Procedures CPT4 Code: [id number] Description: 99213 - WOUND CARE VISIT-LEV 3 EST PT Modifier: Quantity: 1 Physician Procedures : CPT4 Code Description Modifier [id number] - WC PHYS LEVEL 2 - EST PT ICD-10 Diagnosis Description I87.332 Chronic venous hypertension (idiopathic) with ulcer and inflammation of left lower extremity L97.321 Non-pressure chronic ulcer of left ankle  limited to breakdown of skin Quantity: 1 Electronic Signature(s) Signed: 08/23/2020 6:30:39 AM By: Linton Ham MD Entered By: Linton Ham on 08/20/2020 15:53:40

## 2020-09-01 ENCOUNTER — Ambulatory Visit
Admission: RE | Admit: 2020-09-01 | Discharge: 2020-09-01 | Disposition: A | Discharge status: Home / Self Care | Payer: Medicare Other | Source: Ambulatory Visit | Attending: Physician Assistant | Admitting: Physician Assistant

## 2020-09-01 DIAGNOSIS — I83029 Varicose veins of left lower extremity with ulcer of unspecified site: Secondary | ICD-10-CM

## 2020-09-01 DIAGNOSIS — L97929 Non-pressure chronic ulcer of unspecified part of left lower leg with unspecified severity: Secondary | ICD-10-CM

## 2020-09-01 DIAGNOSIS — R609 Edema, unspecified: Secondary | ICD-10-CM

## 2020-09-01 NOTE — Consult Note (Signed)
Chief Complaint: Left leg venous wound  Referring Physician(s): Woodroe Chen III  PCP: Dr. Forde Dandy  History of Present Illness: Jackie [name] is a 86 y.o. female presenting today as a scheduled consultation, kindly referred by Jeri Cos, PA at the [facility], for evaluation of left lower extremity wound.   Jackie [name] is here today with her daughter, who lives in [place], [place].   They tell me that she has, at this point been discharged from the wound care clinic, having achieved suitable healing.  The wound is located on the medial left ankle, typical area of the gaiter zone, and was originally cared for by [facility].  She was referred for wound care a few weeks ago, and now discharged.  She tells me that she has had some discoloration of the skin in this region for at least 5-6 years dating to 2015 at about the same time that she had orthopedic surgery of her right hip.    She was told that orthopedic surgery of the hip will necessarily "change the veins" of the leg, however, she denies any knowledge of DVT or PE.    She was just given a pair of compression stockings within the last couple of weeks, and so has not been wearing them for very long.  She finds them difficult to apply.  She has a pair of knee high stockings.    She was treated with 1 round of antibiotics, and has completed these.     Today's venous duplex exam of the bilateral lower extremity is negative for DVT, and confirms bilateral GSV reflux.   Past Medical History:  Diagnosis Date  . Cancer ([place]) 2000   thyroid cancer  . Osteoporosis   . Thyroid disease   . Urgency of urination     Past Surgical History:  Procedure Laterality Date  . APPENDECTOMY  1997  . HIP ARTHROPLASTY Left 10/12/2014   Procedure: ARTHROPLASTY BIPOLAR HIP;  Surgeon: Marybelle Killings, MD;  Location: [facility];  Service: Orthopedics;  Laterality: Left;  . TOTAL THYROIDECTOMY  2000    Allergies: Codeine camsylate  [codeine] and Zithromax [azithromycin]  Medications: Prior to Admission medications   Medication Sig Start Date End Date Taking? Authorizing Provider  acetaminophen (TYLENOL) 325 MG tablet Take 2 tablets (650 mg total) by mouth every 6 (six) hours as needed for mild pain, moderate pain, fever or headache. 10/15/14   Hongalgi, Lenis Dickinson, MD  amLODipine (NORVASC) 2.5 MG tablet Take 1.25 mg by mouth daily. 10/16/19   [provider]  aspirin 81 MG tablet Take 81 mg by mouth daily.    [provider]  bisacodyl (DULCOLAX) 10 MG suppository Place 1 suppository (10 mg total) rectally daily as needed for moderate constipation. 10/15/14   Hongalgi, Lenis Dickinson, MD  Cholecalciferol (VITAMIN D-3 PO) Take 1 tablet by mouth daily.    [provider]  ibuprofen (ADVIL,MOTRIN) 200 MG tablet Take 1 tablet (200 mg total) by mouth every 6 (six) hours as needed for moderate pain (for pain not controlled with tylenol.). 10/15/14   Hongalgi, Lenis Dickinson, MD  loratadine (CLARITIN) 10 MG tablet Take 10 mg by mouth daily. Takes 5mg  daily    [provider]  Multiple Minerals-Vitamins (CALCIUM & VIT D3 BONE HEALTH PO) Take 1 tablet by mouth daily.    [provider]  Multiple Vitamins-Minerals (MULTIVITAMIN WITH MINERALS) tablet Take 1 tablet by mouth daily.    [provider]  nitrofurantoin, macrocrystal-monohydrate, (MACROBID) 100 MG capsule TAKE 1 CAPSULE BY MOUTH FOR 7 DAYS 11/04/19   [provider]  polyethylene glycol (MIRALAX / GLYCOLAX) packet Take 17 g by mouth daily as needed for mild constipation. Patient taking differently: Take 17 g by mouth daily.  10/15/14   Hongalgi, Lenis Dickinson, MD  rosuvastatin (CRESTOR) 10 MG tablet Take 10 mg by mouth daily.     [provider]  SYNTHROID 112 MCG tablet Take 112 mcg by mouth daily. 11/12/19   [provider]  vitamin B-12 (CYANOCOBALAMIN) 1000 MCG tablet Take 1,000 mcg by mouth daily.    [provider]  vitamin E 1000 UNIT capsule Take 1,000 Units by mouth daily.    [provider]     No family history on file.  Social History   Socioeconomic History  . Marital status: Widowed    Spouse name: Not on file  . Number of children: Not on file  . Years of education: Not on file  . Highest education level: Not on file  Occupational History  . Not on file  Tobacco Use  . Smoking status: Never Smoker  . Smokeless tobacco: Never Used  Substance and Sexual Activity  . Alcohol use: No  . Drug use: No  . Sexual activity: Not on file  Other Topics Concern  . Not on file  Social History Narrative  . Not on file   Social Determinants of Health   Financial Resource Strain:   . Difficulty of Paying Living Expenses: Not on file  Food Insecurity:   . Worried About Charity fundraiser in the Last Year: Not on file  . Ran Out of Food in the Last Year: Not on file  Transportation Needs:   . Lack of Transportation (Medical): Not on file  . Lack of Transportation (Non-Medical): Not on file  Physical Activity:   . Days of Exercise per Week: Not on file  . Minutes of Exercise per Session: Not on file  Stress:   . Feeling of Stress : Not on file  Social Connections:   . Frequency of Communication with Friends and Family: Not on file  . Frequency of Social Gatherings with Friends and Family: Not on file  . Attends Religious Services: Not on file  . Active Member of Clubs or Organizations: Not on file  . Attends Archivist Meetings: Not on file  . Marital Status: Not on file       Review of Systems: A 12 point ROS discussed and pertinent positives are indicated in the HPI above.  All other systems are negative.  Review of Systems  Vital Signs: There were no vitals taken for this visit.  Physical Exam General: 86 yo female appearing stated age.  Well-developed, well-nourished.  No distress. HEENT: Atraumatic, normocephalic.  Glasses. Conjugate  gaze, extra-ocular motor intact. No scleral icterus or scleral injection. No lesions on external ears, nose, lips, or gums.    Neck: Symmetric with no goiter enlargement.  Chest/Lungs:  Symmetric chest with inspiration/expiration.  No labored breathing.    Heart:    No JVD appreciated.  Abdomen:  Soft, NT/ND, with + bowel sounds.   Genito-urinary: Deferred Neurologic: Alert & Oriented to person, place, and time.   Normal affect and insight.  Appropriate questions.  Moving all 4 extremities with gross sensory intact.  Extremities: No wound on the right.  Circumferential erythema on the right distal calf.  There is region of ~4cm x  4cm with some mild seepage of clear serous fluid in the medial gaitor zone.  No pitting edema.  Hemosiderin deposition bilaterally.  Varicose veins bilaterally.  Telangiectasia bilateral.    Mallampati Score:     Imaging: [place] RAD EVAL AND MGMT  Result Date: 09/01/2020 Please refer to "Notes" to see consult details.   Labs:  CBC: No results for input(s): WBC, HGB, HCT, PLT in the last 8760 hours.  COAGS: No results for input(s): INR, APTT in the last 8760 hours.  BMP: No results for input(s): NA, K, CL, CO2, GLUCOSE, BUN, CALCIUM, CREATININE, GFRNONAA, GFRAA in the last 8760 hours.  Invalid input(s): CMP  LIVER FUNCTION TESTS: No results for input(s): BILITOT, AST, ALT, ALKPHOS, PROT, ALBUMIN in the last 8760 hours.  TUMOR MARKERS: No results for input(s): AFPTM, CEA, CA199, CHROMGRNA in the last 8760 hours.  Assessment and Plan: Jackie Christine is an 86 yo female with bilateral lower extremity varicose veins, with a left ankle ulcer, for which she has been discharged from the wound center.   Left leg: CEAP 6, with some serous drainage at the site of the ulcer, and associated stasis dermatitis/eczema Right Leg: CEAP 4, with skin changes.  I had a lengthy discussion with Jackie Zumwalt and her daughter regarding her symptoms of venous insufficiency, the  anatomy and pathology/pathophysiology of venous reflux, the role of the calf-muscle pump, compression stockings, and endovenous ablation.    I let them know that given her left GSV venous insufficiency, she is indeed a candidate for endovenous ablation, which would allow for better environment for healing of the left leg. I discussed the logistics of treatment, including the procedure, risk/benefit analysis, the recovery time, role for compression therapy, and follow up.  Regarding risk/benefit, specific risks include: bleeding, infection, need for further surgery/procedure, DVT/endovenous heat induced thrombus (EHIT), PE, cardiopulmonary collapse, death.    After our discussion, she would like to treat conservatively at this time.  She has a follow up appointment with Dr. Forde Dandy, and will discuss again.    If she has any worsening or wound regression, we are happy to see her back again or set her up for endovenous ablation  Plan: - She has elected conservative measures for the time being regarding her left CEAP-6 disease.  If she has any regression/recurrence of wound, we are happy to discuss endovenous ablation again.  - Compression therapy bilateral lower extremity.   Thank you for this interesting consult.  I greatly enjoyed meeting Jackie [name] and look forward to participating in their care.  A copy of this report was sent to the requesting provider on this date.  Electronically Signed: Corrie Mckusick 09/01/2020, 3:25 PM   I spent a total of  40 Minutes   in face to face in clinical consultation, greater than 50% of which was counseling/coordinating care for bilateral lower extremity venous insufficiency, left leg wound, CEAP-6 disease, possible endovenous ablation.

## 2020-09-11 ENCOUNTER — Other Ambulatory Visit (HOSPITAL_COMMUNITY): Payer: Self-pay | Admitting: *Deleted

## 2020-09-14 ENCOUNTER — Ambulatory Visit (HOSPITAL_COMMUNITY)
Admission: RE | Admit: 2020-09-14 | Discharge: 2020-09-14 | Disposition: A | Discharge status: Home / Self Care | Payer: Medicare Other | Source: Ambulatory Visit | Attending: Endocrinology | Admitting: Endocrinology

## 2020-09-14 ENCOUNTER — Other Ambulatory Visit: Payer: Self-pay

## 2020-09-14 DIAGNOSIS — M810 Age-related osteoporosis without current pathological fracture: Secondary | ICD-10-CM | POA: Insufficient documentation

## 2020-09-14 MED ORDER — DENOSUMAB 60 MG/ML ~~LOC~~ SOSY
60.0000 mg | PREFILLED_SYRINGE | Freq: Once | SUBCUTANEOUS | Status: AC
  Administered 2020-09-14: 60 mg via SUBCUTANEOUS

## 2020-09-14 MED ORDER — DENOSUMAB 60 MG/ML ~~LOC~~ SOSY
PREFILLED_SYRINGE | SUBCUTANEOUS | Status: AC
  Filled 2020-09-14: qty 1

## 2020-09-15 ENCOUNTER — Ambulatory Visit: Payer: Medicare Other | Admitting: Podiatry

## 2020-09-25 ENCOUNTER — Other Ambulatory Visit: Payer: Self-pay

## 2020-09-25 ENCOUNTER — Encounter: Payer: Self-pay | Admitting: Podiatry

## 2020-09-25 ENCOUNTER — Ambulatory Visit: Payer: Medicare Other | Admitting: Podiatry

## 2020-09-25 DIAGNOSIS — M79675 Pain in left toe(s): Secondary | ICD-10-CM

## 2020-09-25 DIAGNOSIS — I872 Venous insufficiency (chronic) (peripheral): Secondary | ICD-10-CM | POA: Diagnosis not present

## 2020-09-25 DIAGNOSIS — M79674 Pain in right toe(s): Secondary | ICD-10-CM | POA: Diagnosis not present

## 2020-09-25 DIAGNOSIS — L089 Local infection of the skin and subcutaneous tissue, unspecified: Secondary | ICD-10-CM | POA: Diagnosis not present

## 2020-09-25 DIAGNOSIS — B351 Tinea unguium: Secondary | ICD-10-CM

## 2020-09-25 MED ORDER — KETOCONAZOLE 2 % EX CREA: TOPICAL_CREAM | CUTANEOUS | 0 refills | Status: DC

## 2020-09-25 MED ORDER — CLINDAMYCIN HCL 300 MG PO CAPS: 300.0000 mg | ORAL_CAPSULE | Freq: Two times a day (BID) | ORAL | 0 refills | Status: DC

## 2020-09-25 NOTE — Progress Notes (Signed)
This patient returns to my office for at risk foot care.  This patient requires this care by a professional since this patient will be at risk due to having venous stasis  Legs  B/L.  This patient is unable to cut nails herself since the patient cannot reach her nails.These nails are painful walking and wearing shoes.  This patient presents for at risk foot care today.  Patient is also concerned about her shiny red lower leg left foot.  She says it has started weeping.  General Appearance  Alert, conversant and in no acute stress.  Vascular  Dorsalis pedis and posterior tibial  pulses are palpable  bilaterally.  Capillary return is within normal limits  bilaterally. Temperature is within normal limits  bilaterally.  Neurologic  Senn-Weinstein monofilament wire test within normal limits  bilaterally. Muscle power within normal limits bilaterally.  Nails Thick disfigured discolored nails with subungual debris  from hallux to fifth toes bilaterally. No evidence of bacterial infection or drainage bilaterally.  Orthopedic  No limitations of motion  feet .  No crepitus or effusions noted.  No bony pathology or digital deformities noted.  Skin  normotropic skin with no porokeratosis noted bilaterally.  No signs of infections or ulcers noted.     Onychomycosis  Pain in right toes  Pain in left toes  Cellulitis secondary to venous stasis left leg.  Consent was obtained for treatment procedures.   Mechanical debridement of nails 1-5  bilaterally performed with a nail nipper.  Filed with dremel without incident. This patient was then evaluated and treated by Dr.  March Rummage.  An unna boot was applied to her left lower leg.  She also was prescribed clindamycin for her cellulitis.  Finally ketocanazole was prescribed for her right foot dorsally..  She is to remove the unna boot in one week and return to the office for Dr.  March Rummage to evaluate and treat in 2 weeks.  Surgical shoe was dispensed.   Return office visit   3 months  For nail care.                  Told patient to return for periodic foot care and evaluation due to potential at risk complications.   Gardiner Barefoot DPM

## 2020-09-29 ENCOUNTER — Other Ambulatory Visit: Payer: Self-pay | Admitting: Interventional Radiology

## 2020-09-29 DIAGNOSIS — I872 Venous insufficiency (chronic) (peripheral): Secondary | ICD-10-CM

## 2020-10-01 ENCOUNTER — Ambulatory Visit
Admission: RE | Admit: 2020-10-01 | Discharge: 2020-10-01 | Disposition: A | Discharge status: Home / Self Care | Payer: Medicare Other | Source: Ambulatory Visit | Attending: Interventional Radiology | Admitting: Interventional Radiology

## 2020-10-01 ENCOUNTER — Other Ambulatory Visit: Payer: Self-pay

## 2020-10-01 DIAGNOSIS — I872 Venous insufficiency (chronic) (peripheral): Secondary | ICD-10-CM

## 2020-10-01 HISTORY — PX: IR RADIOLOGIST EVAL & MGMT: IMG5224

## 2020-10-01 NOTE — Progress Notes (Signed)
Chief Complaint: Left leg venous wound  Referring Physician(s): Stone,Hoyt Eday III Dr. Dellia Nims  PCP: Dr. Forde Dandy  History of Present Illness: Jackie [name] is a 86 y.o. female presenting today as a scheduled follow up, to discuss left leg wound and venous ablation for symptomatic venous insufficiency.   I spoke with Ms Matthes today on the telephone.  We confirmed her identity with 2 personal identifiers.   She was referred for a left lower extremity wound, and we did our original consultation 09/01/20.  At that time we discussed her symptoms, her venous insufficiency documented on duplex exam, and the indication for endovenous ablation.   At the time, she was on the fence for treatment, and decided to use conservative care/surveillance.   Since then she has had some regression in the wound, as she describes further  Breakdown in the skin, and now she needs a Haematologist, which was prescribed recently by Dr. Prudence Davidson at [place]. .    She had difficulty with the ABX that was prescribed, and has stopped taking them. .    She has had more input, she tells me, and is concerned that infection might lead to amputation. .       Past Medical History:  Diagnosis Date  . Cancer ([place]) 2000   thyroid cancer  . Osteoporosis   . Thyroid disease   . Urgency of urination     Past Surgical History:  Procedure Laterality Date  . APPENDECTOMY  1997  . HIP ARTHROPLASTY Left 10/12/2014   Procedure: ARTHROPLASTY BIPOLAR HIP;  Surgeon: Marybelle Killings, MD;  Location: [facility];  Service: Orthopedics;  Laterality: Left;  . IR RADIOLOGIST EVAL & MGMT  10/01/2020  . TOTAL THYROIDECTOMY  2000    Allergies: Codeine camsylate [codeine], Zithromax [azithromycin], and Erythromycin base  Medications: Prior to Admission medications   Medication Sig Start Date End Date Taking? Authorizing Provider  acetaminophen (TYLENOL) 325 MG tablet Take 2 tablets (650 mg total) by mouth every 6 (six)  hours as needed for mild pain, moderate pain, fever or headache. 10/15/14   Hongalgi, Lenis Dickinson, MD  amLODipine (NORVASC) 2.5 MG tablet Take 1.25 mg by mouth daily. 10/16/19   [provider]  aspirin 81 MG tablet Take 81 mg by mouth daily.    [provider]  bisacodyl (DULCOLAX) 10 MG suppository Place 1 suppository (10 mg total) rectally daily as needed for moderate constipation. 10/15/14   Hongalgi, Lenis Dickinson, MD  Cholecalciferol (VITAMIN D-3 PO) Take 1 tablet by mouth daily.    [provider]  clindamycin (CLEOCIN) 300 MG capsule Take 1 capsule (300 mg total) by mouth 2 (two) times daily. 09/25/20   Evelina Bucy, DPM  ibuprofen (ADVIL,MOTRIN) 200 MG tablet Take 1 tablet (200 mg total) by mouth every 6 (six) hours as needed for moderate pain (for pain not controlled with tylenol.). 10/15/14   Hongalgi, Lenis Dickinson, MD  ketoconazole (NIZORAL) 2 % cream Apply 1 fingertip amount to affected area right foot daily. 09/25/20   Evelina Bucy, DPM  loratadine (CLARITIN) 10 MG tablet Take 10 mg by mouth daily. Takes 5mg  daily    [provider]  Multiple Minerals-Vitamins (CALCIUM & VIT D3 BONE HEALTH PO) Take 1 tablet by mouth daily.    [provider]  Multiple Vitamins-Minerals (MULTIVITAMIN WITH MINERALS) tablet Take 1 tablet by mouth daily.    [provider]  nitrofurantoin, macrocrystal-monohydrate, (MACROBID) 100 MG capsule TAKE  1 CAPSULE BY MOUTH FOR 7 DAYS 11/04/19   [provider]  polyethylene glycol (MIRALAX / GLYCOLAX) packet Take 17 g by mouth daily as needed for mild constipation. Patient taking differently: Take 17 g by mouth daily.  10/15/14   Hongalgi, Lenis Dickinson, MD  rosuvastatin (CRESTOR) 10 MG tablet Take 10 mg by mouth daily.     [provider]  SYNTHROID 112 MCG tablet Take 112 mcg by mouth daily. 11/12/19   [provider]  vitamin B-12 (CYANOCOBALAMIN) 1000 MCG tablet Take 1,000 mcg by mouth daily.     [provider]  vitamin E 1000 UNIT capsule Take 1,000 Units by mouth daily.    [provider]     No family history on file.  Social History   Socioeconomic History  . Marital status: Widowed    Spouse name: Not on file  . Number of children: Not on file  . Years of education: Not on file  . Highest education level: Not on file  Occupational History  . Not on file  Tobacco Use  . Smoking status: Never Smoker  . Smokeless tobacco: Never Used  Substance and Sexual Activity  . Alcohol use: No  . Drug use: No  . Sexual activity: Not on file  Other Topics Concern  . Not on file  Social History Narrative  . Not on file   Social Determinants of Health   Financial Resource Strain:   . Difficulty of Paying Living Expenses: Not on file  Food Insecurity:   . Worried About Charity fundraiser in the Last Year: Not on file  . Ran Out of Food in the Last Year: Not on file  Transportation Needs:   . Lack of Transportation (Medical): Not on file  . Lack of Transportation (Non-Medical): Not on file  Physical Activity:   . Days of Exercise per Week: Not on file  . Minutes of Exercise per Session: Not on file  Stress:   . Feeling of Stress : Not on file  Social Connections:   . Frequency of Communication with Friends and Family: Not on file  . Frequency of Social Gatherings with Friends and Family: Not on file  . Attends Religious Services: Not on file  . Active Member of Clubs or Organizations: Not on file  . Attends Archivist Meetings: Not on file  . Marital Status: Not on file       Review of Systems  Review of Systems: A 12 point ROS discussed and pertinent positives are indicated in the HPI above.  All other systems are negative.  Physical Exam No direct physical exam was performed (except for noted visual exam findings with Video Visits).    Vital Signs: There were no vitals taken for this visit.  Imaging: IR Radiologist Eval &  Mgmt  Result Date: 10/01/2020 Please refer to notes tab for details about interventional procedure. (Op Note)   Labs:  CBC: No results for input(s): WBC, HGB, HCT, PLT in the last 8760 hours.  COAGS: No results for input(s): INR, APTT in the last 8760 hours.  BMP: No results for input(s): NA, K, CL, CO2, GLUCOSE, BUN, CALCIUM, CREATININE, GFRNONAA, GFRAA in the last 8760 hours.  Invalid input(s): CMP  LIVER FUNCTION TESTS: No results for input(s): BILITOT, AST, ALT, ALKPHOS, PROT, ALBUMIN in the last 8760 hours.  TUMOR MARKERS: No results for input(s): AFPTM, CEA, CA199, CHROMGRNA in the last 8760 hours.  Assessment and Plan:  Ms  Jacome is an 86 yo female with bilateral lower extremity varicose veins, with a left ankle ulcer, for which she was  Previously discharged from the wound center, though now has started [employer] therapy with the podiatric office.   Previous:  Left leg: CEAP 6, with some serous drainage at the site of the ulcer, and associated stasis dermatitis/eczema Right Leg: CEAP 4, with skin changes.  Previously we discussed with Ms Moger and her daughter the symptoms of venous insufficiency, the anatomy and pathology/pathophysiology of venous reflux, the role of the calf-muscle pump, compression stockings, and endovenous ablation.    I again emphasized today that given the GSV venous insufficiency, she will likely see this cyclic healing (with excellent wound care) and recurrent wound status.  She is indeed a candidate for endovenous ablation, which would allow for better environment for healing of the left leg. I again today discussed the logistics of treatment, including the procedure, risk/benefit analysis, the recovery time, role for compression therapy, and follow up.  Regarding risk/benefit, specific risks include: bleeding, infection, need for further surgery/procedure, DVT/endovenous heat induced thrombus (EHIT), PE, cardiopulmonary collapse, death.     Her main concern is being ambulatory as soon as possible after the treatment, which I assured her is the case with our endovenous ablation treatment.   After our discussion today, she would like to proceed with treatment, but after the new year.         Plan: - She has elected for endovenous ablation of her left CEAP-6 disease. She would like to perform this after the new year.  .  - Compression therapy bilateral lower extremity     Electronically Signed: Corrie Mckusick 10/01/2020, 5:25 PM   I spent a total of    25 Minutes in remote  clinical consultation, greater than 50% of which was counseling/coordinating care for left CEAP 6 disease, endovenous ablation for chronic venous insufficiency.    Visit type: Audio only (telephone). Audio (no video) only due to patient's lack of internet/smartphone capability. Alternative for in-person consultation at [facility], [address] [place], [place], [place]. This visit type was conducted due to national recommendations for restrictions regarding the COVID-19 Pandemic (e.g. social distancing).  This format is felt to be most appropriate for this patient at this time.  All issues noted in this document were discussed and addressed.

## 2020-10-10 ENCOUNTER — Ambulatory Visit: Payer: Medicare Other | Admitting: Podiatry

## 2020-10-10 ENCOUNTER — Other Ambulatory Visit: Payer: Self-pay

## 2020-10-10 ENCOUNTER — Encounter: Payer: Self-pay | Admitting: Podiatry

## 2020-10-10 DIAGNOSIS — I872 Venous insufficiency (chronic) (peripheral): Secondary | ICD-10-CM | POA: Diagnosis not present

## 2020-10-20 ENCOUNTER — Telehealth: Payer: Self-pay | Admitting: Podiatry

## 2020-10-20 NOTE — Telephone Encounter (Signed)
Pt's daughter called stating her wound is beginning to seep discharge. Pt has been applying Aquaphor and compression sock. Would you like me to schedule an appointment for her? Please advise.

## 2020-10-21 ENCOUNTER — Ambulatory Visit: Payer: Medicare Other | Admitting: Podiatry

## 2020-10-21 ENCOUNTER — Other Ambulatory Visit: Payer: Self-pay

## 2020-10-21 DIAGNOSIS — I872 Venous insufficiency (chronic) (peripheral): Secondary | ICD-10-CM | POA: Diagnosis not present

## 2020-10-21 DIAGNOSIS — I890 Lymphedema, not elsewhere classified: Secondary | ICD-10-CM

## 2020-10-27 ENCOUNTER — Encounter: Payer: Self-pay | Admitting: Podiatry

## 2020-10-27 NOTE — Progress Notes (Signed)
Subjective:  Patient ID: Jackie [name], female    DOB: 1932-11-17,  MRN: [id number]  Chief Complaint  Patient presents with  . Wound Check    Pt stated that her left ankle started weaping again and she does have some pain     86 y.o. female presents with the above complaint.  Patient presents with complaint of left ankle serous drainage.  She states that there is some pain associated with it.  Patient states she has increasingly started getting more swelling in the leg.  She does not have any calf pain.  She states is still worse in the morning that her socks will be completely wet because of lymphedema drainage.  She has not tried any compression socks.  She has not seen anyone else prior to see me for this thing.  She denies any other acute complaints.  She does not go to the lymphedema clinic.   Review of Systems: Negative except as noted in the HPI. Denies N/V/F/Ch.  Past Medical History:  Diagnosis Date  . Cancer ([place]) 2000   thyroid cancer  . Osteoporosis   . Thyroid disease   . Urgency of urination     Current Outpatient Medications:  .  acetaminophen (TYLENOL) 325 MG tablet, Take 2 tablets (650 mg total) by mouth every 6 (six) hours as needed for mild pain, moderate pain, fever or headache., Disp: , Rfl:  .  amLODipine (NORVASC) 2.5 MG tablet, Take 1.25 mg by mouth daily., Disp: , Rfl:  .  aspirin 81 MG tablet, Take 81 mg by mouth daily., Disp: , Rfl:  .  bisacodyl (DULCOLAX) 10 MG suppository, Place 1 suppository (10 mg total) rectally daily as needed for moderate constipation., Disp: , Rfl:  .  Cholecalciferol (VITAMIN D-3 PO), Take 1 tablet by mouth daily., Disp: , Rfl:  .  ciprofloxacin (CIPRO) 500 MG tablet, ciprofloxacin 500 mg tablet  TK 1 T PO BID, Disp: , Rfl:  .  clindamycin (CLEOCIN) 300 MG capsule, Take 1 capsule (300 mg total) by mouth 2 (two) times daily., Disp: 14 capsule, Rfl: 0 .  doxycycline (VIBRA-TABS) 100 MG tablet, Take 100 mg by mouth 2 (two) times  daily., Disp: , Rfl:  .  gabapentin (NEURONTIN) 100 MG capsule, gabapentin 100 mg capsule  TK 1 C PO QHS PRF PAIN, Disp: , Rfl:  .  ibuprofen (ADVIL,MOTRIN) 200 MG tablet, Take 1 tablet (200 mg total) by mouth every 6 (six) hours as needed for moderate pain (for pain not controlled with tylenol.)., Disp: , Rfl:  .  ketoconazole (NIZORAL) 2 % cream, Apply 1 fingertip amount to affected area right foot daily., Disp: 30 g, Rfl: 0 .  loratadine (CLARITIN) 10 MG tablet, Take 10 mg by mouth daily. Takes 5mg  daily, Disp: , Rfl:  .  metroNIDAZOLE (FLAGYL) 250 MG tablet, Take 250 mg by mouth 3 (three) times daily., Disp: , Rfl:  .  Multiple Minerals-Vitamins (CALCIUM & VIT D3 BONE HEALTH PO), Take 1 tablet by mouth daily., Disp: , Rfl:  .  Multiple Vitamins-Minerals (MULTIVITAMIN WITH MINERALS) tablet, Take 1 tablet by mouth daily., Disp: , Rfl:  .  nitrofurantoin, macrocrystal-monohydrate, (MACROBID) 100 MG capsule, TAKE 1 CAPSULE BY MOUTH FOR 7 DAYS, Disp: , Rfl:  .  polyethylene glycol (MIRALAX / GLYCOLAX) packet, Take 17 g by mouth daily as needed for mild constipation. (Patient taking differently: Take 17 g by mouth daily. ), Disp: , Rfl:  .  prednisoLONE acetate (PRED FORTE) 1 %  ophthalmic suspension, prednisolone acetate 1 % eye drops,suspension  INSTILL 1 DROP IN AFFECTED EYE(S) FOUR TIMES DAILY FOR 7 DAYS AFTER SURGERY, Disp: , Rfl:  .  rosuvastatin (CRESTOR) 10 MG tablet, Take 10 mg by mouth daily. , Disp: , Rfl:  .  sulfamethoxazole-trimethoprim (BACTRIM DS) 800-160 MG tablet, Take 1 tablet by mouth 2 (two) times daily., Disp: , Rfl:  .  SYNTHROID 112 MCG tablet, Take 112 mcg by mouth daily., Disp: , Rfl:  .  triamcinolone (KENALOG) 0.1 %, SMARTSIG:Sparingly Topical 1 to 3 Times Daily PRN, Disp: , Rfl:  .  vitamin B-12 (CYANOCOBALAMIN) 1000 MCG tablet, Take 1,000 mcg by mouth daily., Disp: , Rfl:  .  vitamin E 1000 UNIT capsule, Take 1,000 Units by mouth daily., Disp: , Rfl:   Social History    Tobacco Use  Smoking Status Never Smoker  Smokeless Tobacco Never Used    Allergies  Allergen Reactions  . Codeine Camsylate [Codeine] Other (See Comments)    Caused severe headaches, dizziness and confusion  . Zithromax [Azithromycin] Nausea And Vomiting  . Erythromycin Base    Objective:  There were no vitals filed for this visit. There is no height or weight on file to calculate BMI. Constitutional Well developed. Well nourished.  Vascular Dorsalis pedis pulses palpable bilaterally. Posterior tibial pulses palpable bilaterally. Capillary refill normal to all digits.  No cyanosis or clubbing noted. Pedal hair growth normal.  Neurologic Normal speech. Oriented to person, place, and time. Epicritic sensation to light touch grossly present bilaterally.  Dermatologic  serous drainage noted to the left ankle.  No ulceration or breakdown of the skin noted.  Severe 3+ pitting edema noted to the left lower extremity.  No calf pain.  No concern for DVT.  Motor or sensory functions are intact.  Orthopedic: Normal joint ROM without pain or crepitus bilaterally. No visible deformities. No bony tenderness.   Radiographs: None Assessment:   1. Venous (peripheral) insufficiency   2. Lymphedema of left leg    Plan:  Patient was evaluated and treated and all questions answered.  Left lower extremity lymphedema without concern for acute DVT -I explained to the patient the etiology of lymphedema and various treatment options were discussed.  I discussed with the patient that chronic lymphedema can lead to weeping/serous drainage being expressed from the skin.  I discussed with her the importance of compression socks and Ace bandage to help decrease the swelling.  Ultimately if there is no improvement with Ace bandages I will plan on doing Unna boot therapy or sending her to the lymphedema clinic.  At this time patient does not have any underlying breakdown of the skin or concern for  ulceration.  I feel comfortable proceeding with Ace bandage therapy.  No follow-ups on file.

## 2020-10-28 NOTE — Progress Notes (Signed)
  Subjective:  Patient ID: Jackie [name], female    DOB: 1932-03-13,  MRN: [id number]  Chief Complaint  Patient presents with  . Foot Ulcer    the left ankle area still has the draining and the unna boot was super tight    86 y.o. female presents with the above complaint. History confirmed with patient.   Objective:  Physical Exam: warm, good capillary refill, no trophic changes or ulcerative lesions, normal DP and PT pulses and normal sensory exam. Left Foot: reduced cellulitis left leg, still mild edema.     Assessment:   1. Venous (peripheral) insufficiency    Plan:  Patient was evaluated and treated and all questions answered.  Cellulitis left leg -Cellulitis improving. -Unna boot reapplied  No follow-ups on file.

## 2020-10-29 ENCOUNTER — Other Ambulatory Visit: Payer: Self-pay

## 2020-10-29 ENCOUNTER — Encounter (HOSPITAL_BASED_OUTPATIENT_CLINIC_OR_DEPARTMENT_OTHER): Payer: Medicare Other | Attending: Internal Medicine | Admitting: Internal Medicine

## 2020-10-29 DIAGNOSIS — I87332 Chronic venous hypertension (idiopathic) with ulcer and inflammation of left lower extremity: Secondary | ICD-10-CM | POA: Diagnosis not present

## 2020-10-29 DIAGNOSIS — I890 Lymphedema, not elsewhere classified: Secondary | ICD-10-CM | POA: Diagnosis not present

## 2020-10-29 DIAGNOSIS — L97321 Non-pressure chronic ulcer of left ankle limited to breakdown of skin: Secondary | ICD-10-CM | POA: Insufficient documentation

## 2020-10-29 NOTE — Progress Notes (Signed)
KISHA, MESSMAN [place]. ([id number]) Visit Report for 10/29/2020 Abuse/Suicide Risk Screen Details Patient Name: Date of Service: [name] CE M. 10/29/2020 10:30 A M Medical Record Number: [id number] Patient Account Number: [id number] Date of Birth/Sex: Treating RN: 1932/04/08 (86 y.o. [name] Primary Care Lucus Lambertson: [name] Other Clinician: Referring Ashley Montminy: Treating Barton Want/Extender: [name] in Treatment: 0 Abuse/Suicide Risk Screen Items Answer ABUSE RISK SCREEN: Has anyone close to you tried to hurt or harm you recentlyo No Do you feel uncomfortable with anyone in your familyo No Has anyone forced you do things that you didnt want to doo No Electronic Signature(s) Signed: 10/29/2020 4:55:20 PM By: Carlene Coria RN Entered By: Carlene Coria on 10/29/2020 11:15:15 -------------------------------------------------------------------------------- Activities of Daily Living Details Patient Name: Date of Service: [name] CE M. 10/29/2020 10:30 A M Medical Record Number: [id number] Patient Account Number: [id number] Date of Birth/Sex: Treating RN: 15-Nov-1932 (86 y.o. [name] Primary Care Tasnim Balentine: [name] Other Clinician: Referring Shad Ledvina: Treating Lavenia Stumpo/Extender: [name] in Treatment: 0 Activities of Daily Living Items Answer Activities of Daily Living (Please select one for each item) Drive Automobile Completely Able T Medications ake Completely Able Use T elephone Completely Able Care for Appearance Completely Able Use T oilet Completely Able Bath / Shower Completely Able Dress Self Completely Able Feed Self Completely Able Walk Completely Able Get In / Out Bed Completely Able Housework Need Assistance Prepare Meals Need Assistance Handle Money Completely Able Shop for Self Need Assistance Electronic Signature(s) Signed: 10/29/2020 4:55:20 PM By: Carlene Coria RN Entered By:  Carlene Coria on 10/29/2020 11:16:02 -------------------------------------------------------------------------------- Education Screening Details Patient Name: Date of Service: [name], [place]. 10/29/2020 10:30 A M Medical Record Number: [id number] Patient Account Number: [id number] Date of Birth/Sex: Treating RN: 1932-04-21 (86 y.o. [name] Primary Care Zauria Dombek: [name] Other Clinician: Referring Lysle Yero: Treating Lilliemae Fruge/Extender: [name] in Treatment: 0 Primary Learner Assessed: Patient Learning Preferences/Education Level/Primary Language Learning Preference: Explanation Highest Education Level: High School Preferred Language: English Cognitive Barrier Language Barrier: No Translator Needed: No Memory Deficit: No Emotional Barrier: No Cultural/Religious Beliefs Affecting Medical Care: No Physical Barrier Impaired Vision: Yes Glasses Impaired Hearing: No Decreased Hand dexterity: No Knowledge/Comprehension Knowledge Level: Medium Comprehension Level: High Ability to understand written instructions: High Ability to understand verbal instructions: High Motivation Anxiety Level: Anxious Cooperation: Cooperative Education Importance: Acknowledges Need Interest in Health Problems: Asks Questions Perception: Coherent Willingness to Engage in Self-Management High Activities: Readiness to Engage in Self-Management High Activities: Electronic Signature(s) Signed: 10/29/2020 4:55:20 PM By: Carlene Coria RN Entered By: Carlene Coria on 10/29/2020 11:16:49 -------------------------------------------------------------------------------- Fall Risk Assessment Details Patient Name: Date of Service: [name], [place]. 10/29/2020 10:30 A M Medical Record Number: [id number] Patient Account Number: [id number] Date of Birth/Sex: Treating RN: 1931-12-20 (86 y.o. [name] Primary Care Evian Derringer: [name] Other  Clinician: Referring Yasira Engelson: Treating Quante Pettry/Extender: [name] in Treatment: 0 Fall Risk Assessment Items Have you had 2 or more falls in the last 12 monthso 0 No Have you had any fall that resulted in injury in the last 12 monthso 0 No FALLS RISK SCREEN History of falling - immediate or within 3 months 0 No Secondary diagnosis (Do you have 2 or more medical diagnoseso) 0 No Ambulatory aid None/bed rest/wheelchair/nurse 0 No Crutches/cane/walker 0 No Furniture 0 No Intravenous therapy Access/Saline/Heparin Lock 0 No Gait/Transferring Normal/ bed rest/  wheelchair 0 No Weak (short steps with or without shuffle, stooped but able to lift head while walking, may seek 0 No support from furniture) Impaired (short steps with shuffle, may have difficulty arising from chair, head down, impaired 0 No balance) Mental Status Oriented to own ability 0 No Electronic Signature(s) Signed: 10/29/2020 4:55:20 PM By: Carlene Coria RN Entered By: Carlene Coria on 10/29/2020 11:17:01 -------------------------------------------------------------------------------- Foot Assessment Details Patient Name: Date of Service: [name], [place] M. 10/29/2020 10:30 A M Medical Record Number: [id number] Patient Account Number: [id number] Date of Birth/Sex: Treating RN: 1931-12-31 (86 y.o. [name] Primary Care Timithy Arons: [name] Other Clinician: Referring Castulo Scarpelli: Treating Johneisha Broaden/Extender: [name] in Treatment: 0 Foot Assessment Items Site Locations + = Sensation present, - = Sensation absent, C = Callus, U = Ulcer R = Redness, W = Warmth, M = Maceration, PU = Pre-ulcerative lesion F = Fissure, S = Swelling, D = Dryness Assessment Right: Left: Other Deformity: No No Prior Foot Ulcer: No No Prior Amputation: No No Charcot Joint: No No Ambulatory Status: Ambulatory Without Help Gait: Steady Electronic Signature(s) Signed:  10/29/2020 4:55:20 PM By: Carlene Coria RN Entered By: Carlene Coria on 10/29/2020 11:26:41 -------------------------------------------------------------------------------- Nutrition Risk Screening Details Patient Name: Date of Service: [name], [place]. 10/29/2020 10:30 A M Medical Record Number: [id number] Patient Account Number: [id number] Date of Birth/Sex: Treating RN: Aug 07, 1932 (86 y.o. [name] Primary Care Aamirah Salmi: [name] Other Clinician: Referring Rayya Yagi: Treating Chermaine Schnyder/Extender: [name] in Treatment: 0 Height (in): 61 Weight (lbs): 126 Body Mass Index (BMI): 23.8 Nutrition Risk Screening Items Score Screening NUTRITION RISK SCREEN: I have an illness or condition that made me change the kind and/or amount of food I eat 0 No I eat fewer than two meals per day 0 No I eat few fruits and vegetables, or milk products 0 No I have three or more drinks of beer, liquor or wine almost every day 0 No I have tooth or mouth problems that make it hard for me to eat 0 No I don't always have enough money to buy the food I need 0 No I eat alone most of the time 0 No I take three or more different prescribed or over-the-counter drugs a day 1 Yes Without wanting to, I have lost or gained 10 pounds in the last six months 0 No I am not always physically able to shop, cook and/or feed myself 2 Yes Nutrition Protocols Good Risk Protocol Moderate Risk Protocol 0 Provide education on nutrition High Risk Proctocol Risk Level: Moderate Risk Score: 3 Electronic Signature(s) Signed: 10/29/2020 4:55:20 PM By: Carlene Coria RN Entered By: Carlene Coria on 10/29/2020 11:17:20

## 2020-10-29 NOTE — Progress Notes (Signed)
Jackie [name]. ([id number]) Visit Report for 10/29/2020 HPI Details Patient Name: Date of Service: Jackie [name] CE M. 10/29/2020 10:30 A M Medical Record Number: [id number] Patient Account Number: [id number] Date of Birth/Sex: Treating RN: Aug 04, 1932 (86 y.o. Jackie [name] Primary Care Provider: [name] Other Clinician: Referring Provider: Treating Provider/Extender: [name] in Treatment: 0 History of Present Illness HPI Description: ADMISSION 07/17/2020 This is an 86 year old woman who is referred by her nurse practitioner at [facility] who has been doing treatment for the wound on the right medial lower leg and ankle. Apparently 1 point a substantial wound however it is largely epithelialized now. Patient states its been there for about 5 or 6 weeks. On the background she has changes in the skin in this area I think the go back several years to when she had a left hip fracture. Nevertheless she does not have a wound history that I could elicit. As far as I know that they had been using TCA, Mepitel under an Unna boot they have been changing this weekly at [facility] [employer] [facility] [employer]. She also received a course of doxycycline. Past medical history very little that I can find in [place] link. She has a history of venous insufficiency bilaterally and a left hip fracture hypertension and a history of thyroidectomy for thyroid cancer. ABI in our clinic was 1.13 on the left 8/27; the patient is substantial initial wound by her description on the left medial lower leg and ankle is completely epithelialized. She has chronic venous insufficiency, dermatosclerosis and probably lymphedema. She does not have stockings and states that she would have trouble getting over the toe stockings on in any fashion because of hip problems except 9/3; this is a patient with chronic venous insufficiency with a particularly difficult area on her left  medial lower leg and ankle. My assessment of this is that she has chronic venous insufficiency and chronic venous hypertension. And at least on the left medial ankle stasis dermatitis. The area still looks angry and inflamed. Nevertheless it is epithelialized. I do not believe there is active infection. We have ordered her bilateral juxta lite stockings. These should be easier for her to put on but need to be adjusted to 30/40 compression. If this is not successful in maintaining this area I think she is going to need formal reflux studies to see if she might be a candidate for ablation of the greater saphenous vein 08/05/2020 upon evaluation today patient appears to be doing poorly in regard to her lower extremity. Unfortunately this has reopened after being healed on Friday. She tells me she was wearing her compression wrap but that even when she woke up in the morning on Saturday morning she was leaking. Fortunately there is no signs of active infection at this time systemically though there is some question of a thing locally. She does have juxta light compression bilaterally. 08/12/2020 on evaluation today patient appears to be doing well with regard to her leg ulcer which actually is closed again at this point. With that being said she is still having some issues currently with very thin skin that has come back over and again with her swelling if we do not wrap around afraid this is can open right back up. Nonetheless I do believe that she should proceed with the vascular testing next Thursday as previously ordered and recommended. Fortunately there is no signs of active infection at this time. 9/23; this is a patient  I discharged a few weeks ago. Apparently she had a reopening on the left leg medially. This is closed now. We have ordered venous reflux studies but they are not until 10/5 at [place] 10/29/2020 Patient is a now 86 year old female. She is somewhat frail  however still lives at home largely on her own. We had her in the clinic in August and September 2021 with a wound on the left medial lower extremity in the setting of chronic venous insufficiency and lymphedema. This closed reasonably easily and we discharged her to juxta lite stockings. After her discharge she had her venous reflux studies at interventional radiology. She was seen in consultation by Dr. Earleen Newport. She did not have evidence of thrombosis. She did have significant reflux of the bilateral greater saphenous vein on the right there was reflux from the saphenofemoral junction to the proximal calf where there is further decompression through network of varicosities on the left there was reflux from the saphenofemoral junction to the distal calf there is also a varicose network decompressing the calf. She was recommended I think for venous ablations of the greater saphenous vein on the left however she is wanting to defer that it into the new year. She tells me that very shortly after she left here the last time she was not able to put the juxta lite stocking on largely the inner sleeve. She developed swelling and then leakage and skin breakdown again in the left medial lower extremity. She was seen by podiatry on 3 occasions I think they were applying Unna boots. Her son says that at one point the [place] boot was not put on properly his wife who is a nurse had to adjust this and the swelling is actually somewhat better. She arrives in clinic today with very significant swelling of the proximal two thirds of her lower extremity erythema superficial skin breakdown on the left medial ankle and calf and weeping edema fluid. Past medical history not much change from last time she has chronic venous insufficiency with very significant reflux and lymphedema. She states that she has trouble bending over to get the inner part of the juxta lite stocking on because of hip problems. Her ABI in this clinic  was not repeated but the last time she was here it was 1.13 she was not felt to have an arterial issue Electronic Signature(s) Signed: 10/29/2020 12:52:04 PM By: Linton Ham MD Entered By: Linton Ham on 10/29/2020 12:38:35 -------------------------------------------------------------------------------- Physical Exam Details Patient Name: Date of Service: Jackie [name], [place]. 10/29/2020 10:30 A M Medical Record Number: [id number] Patient Account Number: [id number] Date of Birth/Sex: Treating RN: 13-Aug-1932 (86 y.o. Jackie [name] Primary Care Provider: [name] Other Clinician: Referring Provider: Treating Provider/Extender: [name] in Treatment: 0 Constitutional Patient is hypertensive.. Pulse regular and within target range for patient.Marland Kitchen Respirations regular, non-labored and within target range.. Temperature is normal and within the target range for the patient.Marland Kitchen Appears in no distress. Respiratory work of breathing is normal. Bilateral breath sounds are clear and equal in all lobes with no wheezes, rales or rhonchi.. Cardiovascular No signs of CHF. Pedal pulses are palpable. Significant edema in the left lower leg. From two thirds of the way up mostly nonpitting but there is some pitting component to this. Distally in her ankle erythema skin breakdown Lipodermatosclerosis. Notes Wound exam; the patient has considerable degree of loss of epithelialization on the left medial ankle and lower extremity. Weeping edema fluid. Marked  inflammation without clear cellulitis. I do not believe she has a DVT I think we simply need to control the edema which we have seen before in this patient. Venous Dopplers on 10/5 did not show any evidence of a DVT Electronic Signature(s) Signed: 10/29/2020 12:52:04 PM By: Linton Ham MD Entered By: Linton Ham on 10/29/2020  12:46:55 -------------------------------------------------------------------------------- Physician Orders Details Patient Name: Date of Service: Jackie [name], [place]. 10/29/2020 10:30 A M Medical Record Number: [id number] Patient Account Number: [id number] Date of Birth/Sex: Treating RN: [birth year] (86 y.o. Jackie [name] Primary Care Provider: [name] Other Clinician: Referring Provider: Treating Provider/Extender: [name] in Treatment: 0 Verbal / Phone Orders: No Diagnosis Coding Follow-up Appointments Return Appointment in 1 week. Bathing/ Shower/ Hygiene May shower with protection but do not get wound dressing(s) wet. - use cast protector. Edema Control - Lymphedema / SCD / Other Elevate legs to the level of the heart or above for 30 minutes daily and/or when sitting, a frequency of: - 3-4 times throughout the day. Avoid standing for long periods of time. Exercise regularly Wound Treatment Wound #2 - Lower Leg Wound Laterality: Left, Medial Peri-Wound Care: Triamcinolone 15 (g) Other:changed weekly./Other:changed weekly. Discharge Instructions: Use triamcinolone 15 (g) as directed. mixed with lotion. Peri-Wound Care: Zinc Oxide Ointment 30g tube Other:changed weekly./Other:changed weekly. Discharge Instructions: Apply Zinc Oxide to periwound with each dressing change. As needed for maceration, wetness, or redness. Prim Dressing: KerraCel Ag Gelling Fiber Dressing, 4x5 in (silver alginate) Other:changed weekly./Other:changed weekly. ary Discharge Instructions: Apply silver alginate to wound bed as instructed Secondary Dressing: Woven Gauze Sponge, Non-Sterile 4x4 in Other:changed weekly./Other:changed weekly. Discharge Instructions: Apply over primary dressing as directed. Secondary Dressing: ABD Pad, 8x10 Other:changed weekly./Other:changed weekly. Discharge Instructions: Apply over primary dressing as directed. Secondary Dressing: Zetuvit  Plus 4x8 in Other:changed weekly./Other:changed weekly. Discharge Instructions: Apply over primary dressing as directed. Compression Wrap: ThreePress (3 layer compression wrap) Other:changed weekly./Other:changed weekly. Discharge Instructions: Apply three layer compression as directed. Electronic Signature(s) Signed: 10/29/2020 12:52:04 PM By: Linton Ham MD Signed: 10/29/2020 5:54:37 PM By: Deon Pilling Entered By: Deon Pilling on 10/29/2020 11:55:39 -------------------------------------------------------------------------------- Problem List Details Patient Name: Date of Service: Jackie [name], [place]. 10/29/2020 10:30 A M Medical Record Number: [id number] Patient Account Number: [id number] Date of Birth/Sex: Treating RN: [birth year] (86 y.o. Jackie [name] Primary Care Provider: [name] Other Clinician: Referring Provider: Treating Provider/Extender: [name] in Treatment: 0 Active Problems ICD-10 Encounter Code Description Active Date MDM Diagnosis I87.332 Chronic venous hypertension (idiopathic) with ulcer and inflammation of left 10/29/2020 No Yes lower extremity I89.0 Lymphedema, not elsewhere classified 10/29/2020 No Yes L97.321 Non-pressure chronic ulcer of left ankle limited to breakdown of skin 10/29/2020 No Yes Inactive Problems Resolved Problems Electronic Signature(s) Signed: 10/29/2020 12:52:04 PM By: Linton Ham MD Entered By: Linton Ham on 10/29/2020 12:31:46 -------------------------------------------------------------------------------- Progress Note Details Patient Name: Date of Service: Jackie [name], [place]. 10/29/2020 10:30 A M Medical Record Number: [id number] Patient Account Number: [id number] Date of Birth/Sex: Treating RN: 09/20/1932 (86 y.o. Jackie [name] Primary Care Provider: [name] Other Clinician: Referring Provider: Treating Provider/Extender: [name] in  Treatment: 0 Subjective History of Present Illness (HPI) ADMISSION 07/17/2020 This is an 86 year old woman who is referred by her nurse practitioner at [facility] who has been doing treatment for the wound on the right medial lower leg and ankle. Apparently 1 point a substantial wound  however it is largely epithelialized now. Patient states its been there for about 5 or 6 weeks. On the background she has changes in the skin in this area I think the go back several years to when she had a left hip fracture. Nevertheless she does not have a wound history that I could elicit. As far as I know that they had been using TCA, Mepitel under an Unna boot they have been changing this weekly at [facility]. She also received a course of doxycycline. Past medical history very little that I can find in [place] link. She has a history of venous insufficiency bilaterally and a left hip fracture hypertension and a history of thyroidectomy for thyroid cancer. ABI in our clinic was 1.13 on the left 8/27; the patient is substantial initial wound by her description on the left medial lower leg and ankle is completely epithelialized. She has chronic venous insufficiency, dermatosclerosis and probably lymphedema. She does not have stockings and states that she would have trouble getting over the toe stockings on in any fashion because of hip problems except 9/3; this is a patient with chronic venous insufficiency with a particularly difficult area on her left medial lower leg and ankle. My assessment of this is that she has chronic venous insufficiency and chronic venous hypertension. And at least on the left medial ankle stasis dermatitis. The area still looks angry and inflamed. Nevertheless it is epithelialized. I do not believe there is active infection. We have ordered her bilateral juxta lite stockings. These should be easier for her to put on but need to be adjusted to 30/40  compression. If this is not successful in maintaining this area I think she is going to need formal reflux studies to see if she might be a candidate for ablation of the greater saphenous vein 08/05/2020 upon evaluation today patient appears to be doing poorly in regard to her lower extremity. Unfortunately this has reopened after being healed on Friday. She tells me she was wearing her compression wrap but that even when she woke up in the morning on Saturday morning she was leaking. Fortunately there is no signs of active infection at this time systemically though there is some question of a thing locally. She does have juxta light compression bilaterally. 08/12/2020 on evaluation today patient appears to be doing well with regard to her leg ulcer which actually is closed again at this point. With that being said she is still having some issues currently with very thin skin that has come back over and again with her swelling if we do not wrap around afraid this is can open right back up. Nonetheless I do believe that she should proceed with the vascular testing next Thursday as previously ordered and recommended. Fortunately there is no signs of active infection at this time. 9/23; this is a patient I discharged a few weeks ago. Apparently she had a reopening on the left leg medially. This is closed now. We have ordered venous reflux studies but they are not until 10/5 at [place] 10/29/2020 Patient is a now 86 year old female. She is somewhat frail however still lives at home largely on her own. We had her in the clinic in August and September 2021 with a wound on the left medial lower extremity in the setting of chronic venous insufficiency and lymphedema. This closed reasonably easily and we discharged her to juxta lite stockings. After her discharge she had her venous reflux studies at interventional radiology.  She was seen in consultation by Dr. Earleen Newport. She did not have  evidence of thrombosis. She did have significant reflux of the bilateral greater saphenous vein on the right there was reflux from the saphenofemoral junction to the proximal calf where there is further decompression through network of varicosities on the left there was reflux from the saphenofemoral junction to the distal calf there is also a varicose network decompressing the calf. She was recommended I think for venous ablations of the greater saphenous vein on the left however she is wanting to defer that it into the new year. She tells me that very shortly after she left here the last time she was not able to put the juxta lite stocking on largely the inner sleeve. She developed swelling and then leakage and skin breakdown again in the left medial lower extremity. She was seen by podiatry on 3 occasions I think they were applying Unna boots. Her son says that at one point the [facility] boot was not put on properly his wife who is a nurse had to adjust this and the swelling is actually somewhat better. She arrives in clinic today with very significant swelling of the proximal two thirds of her lower extremity erythema superficial skin breakdown on the left medial ankle and calf and weeping edema fluid. Past medical history not much change from last time she has chronic venous insufficiency with very significant reflux and lymphedema. She states that she has trouble bending over to get the inner part of the juxta lite stocking on because of hip problems. Her ABI in this clinic was not repeated but the last time she was here it was 1.13 she was not felt to have an arterial issue Patient History Information obtained from Patient. Allergies codeine, Zithromax Family History Cancer - Siblings, Diabetes - Siblings, Heart Disease - Father, Hypertension - Mother,Father, Lung Disease - Siblings, Stroke - Mother, No family history of Hereditary Spherocytosis, Kidney Disease, Seizures, Thyroid Problems,  Tuberculosis. Social History Never smoker, Marital Status - Widowed, Alcohol Use - Never, Drug Use - No History, Caffeine Use - Rarely. Medical History Eyes Denies history of Cataracts, Glaucoma, Optic Neuritis Ear/Nose/Mouth/Throat Denies history of Chronic sinus problems/congestion, Middle ear problems Hematologic/Lymphatic Denies history of Anemia, Hemophilia, Human Immunodeficiency Virus, Lymphedema, Sickle Cell Disease Respiratory Denies history of Aspiration, Asthma, Chronic Obstructive Pulmonary Disease (COPD), Pneumothorax, Sleep Apnea, Tuberculosis Cardiovascular Patient has history of Hypertension Denies history of Angina, Arrhythmia, Congestive Heart Failure, Coronary Artery Disease, Deep Vein Thrombosis, Hypotension, Myocardial Infarction, Peripheral Arterial Disease, Peripheral Venous Disease, Phlebitis, Vasculitis Gastrointestinal Denies history of Cirrhosis , Colitis, Crohnoos, Hepatitis A, Hepatitis B, Hepatitis C Endocrine Denies history of Type I Diabetes, Type II Diabetes Genitourinary Denies history of End Stage Renal Disease Immunological Denies history of Lupus Erythematosus, Raynaudoos, Scleroderma Integumentary (Skin) Denies history of History of Burn Musculoskeletal Denies history of Gout, Rheumatoid Arthritis, Osteoarthritis, Osteomyelitis Neurologic Denies history of Dementia, Neuropathy, Quadriplegia, Paraplegia, Seizure Disorder Oncologic Denies history of Received Chemotherapy, Received Radiation Psychiatric Denies history of Anorexia/bulimia, Confinement Anxiety Review of Systems (ROS) Constitutional Symptoms (General Health) Denies complaints or symptoms of Fatigue, Fever, Chills, Marked Weight Change. Eyes Denies complaints or symptoms of Dry Eyes, Vision Changes, Glasses / Contacts. Ear/Nose/Mouth/Throat Denies complaints or symptoms of Chronic sinus problems or rhinitis. Respiratory Denies complaints or symptoms of Chronic or frequent  coughs, Shortness of Breath. Cardiovascular Denies complaints or symptoms of Chest pain. Gastrointestinal Denies complaints or symptoms of Frequent diarrhea, Nausea, Vomiting. Endocrine Denies complaints or  symptoms of Heat/cold intolerance. Genitourinary Denies complaints or symptoms of Frequent urination. Integumentary (Skin) Complains or has symptoms of Wounds. Musculoskeletal Denies complaints or symptoms of Muscle Pain, Muscle Weakness. Neurologic Denies complaints or symptoms of Numbness/parasthesias. Psychiatric Denies complaints or symptoms of Claustrophobia, Suicidal. Objective Constitutional Patient is hypertensive.. Pulse regular and within target range for patient.Marland Kitchen Respirations regular, non-labored and within target range.. Temperature is normal and within the target range for the patient.Marland Kitchen Appears in no distress. Vitals Time Taken: 11:12 AM, Height: 61 in, Source: Stated, Weight: 126 lbs, BMI: 23.8, Temperature: 97.7 F, Pulse: 69 bpm, Respiratory Rate: 18 breaths/min, Blood Pressure: 154/70 mmHg. Respiratory work of breathing is normal. Bilateral breath sounds are clear and equal in all lobes with no wheezes, rales or rhonchi.. Cardiovascular No signs of CHF. Pedal pulses are palpable. Significant edema in the left lower leg. From two thirds of the way up mostly nonpitting but there is some pitting component to this. Distally in her ankle erythema skin breakdown Lipodermatosclerosis. General Notes: Wound exam; the patient has considerable degree of loss of epithelialization on the left medial ankle and lower extremity. Weeping edema fluid. Marked inflammation without clear cellulitis. I do not believe she has a DVT I think we simply need to control the edema which we have seen before in this patient. Venous Dopplers on 10/5 did not show any evidence of a DVT Integumentary (Hair, Skin) Wound #2 status is Open. Original cause of wound was Gradually Appeared. The wound is  located on the Left,Medial Lower Leg. The wound measures 8cm length x 6.5cm width x 0.1cm depth; 40.841cm^2 area and 4.084cm^3 volume. Assessment Active Problems ICD-10 Chronic venous hypertension (idiopathic) with ulcer and inflammation of left lower extremity Lymphedema, not elsewhere classified Non-pressure chronic ulcer of left ankle limited to breakdown of skin Procedures Wound #2 Pre-procedure diagnosis of Wound #2 is a Lymphedema located on the Left,Medial Lower Leg . There was a Three Layer Compression Therapy Procedure with a pre-treatment ABI of 1.1 by Baruch Gouty, RN. Post procedure Diagnosis Wound #2: Same as Pre-Procedure Plan Follow-up Appointments: Return Appointment in 1 week. Bathing/ Shower/ Hygiene: May shower with protection but do not get wound dressing(s) wet. - use cast protector. Edema Control - Lymphedema / SCD / Other: Elevate legs to the level of the heart or above for 30 minutes daily and/or when sitting, a frequency of: - 3-4 times throughout the day. Avoid standing for long periods of time. Exercise regularly WOUND #2: - Lower Leg Wound Laterality: Left, Medial Peri-Wound Care: Triamcinolone 15 (g) Other:changed weekly./Other:changed weekly. Discharge Instructions: Use triamcinolone 15 (g) as directed. mixed with lotion. Peri-Wound Care: Zinc Oxide Ointment 30g tube Other:changed weekly./Other:changed weekly. Discharge Instructions: Apply Zinc Oxide to periwound with each dressing change. As needed for maceration, wetness, or redness. Prim Dressing: KerraCel Ag Gelling Fiber Dressing, 4x5 in (silver alginate) Other:changed weekly./Other:changed weekly. ary Discharge Instructions: Apply silver alginate to wound bed as instructed Secondary Dressing: Woven Gauze Sponge, Non-Sterile 4x4 in Other:changed weekly./Other:changed weekly. Discharge Instructions: Apply over primary dressing as directed. Secondary Dressing: ABD Pad, 8x10 Other:changed  weekly./Other:changed weekly. Discharge Instructions: Apply over primary dressing as directed. Secondary Dressing: Zetuvit Plus 4x8 in Other:changed weekly./Other:changed weekly. Discharge Instructions: Apply over primary dressing as directed. Com pression Wrap: ThreePress (3 layer compression wrap) Other:changed weekly./Other:changed weekly. Discharge Instructions: Apply three layer compression as directed. 1. This patient has a combination of chronic venous insufficiency and secondary lymphedema she has an inverted bottle sign poorly controlled edema in the  upper two thirds of her left calf. This is resulted in skin breakdown inflammation in the distal lower leg and ankle. 2. We are going to put silver alginate/Zetuvit/ABDs under 3 layer compression 3. I think this will heal fairly quickly if we can control the swelling however seeing a path forward for this patient to control the lymphedema in her left lower leg is not clear. 4. She also has severe venous insufficiency and I have encouraged her to go through with the left lower extremity greater saphenous vein ablation with Dr. Earleen Newport 5. I saw no evidence of systemic fluid volume overload Electronic Signature(s) Signed: 10/29/2020 12:52:04 PM By: Linton Ham MD Entered By: Linton Ham on 10/29/2020 12:49:41 -------------------------------------------------------------------------------- HxROS Details Patient Name: Date of Service: Jackie [name], [place]. 10/29/2020 10:30 A M Medical Record Number: [id number] Patient Account Number: [id number] Date of Birth/Sex: Treating RN: [birth year] (86 y.o. [name] Primary Care Provider: [name] Other Clinician: Referring Provider: Treating Provider/Extender: [name] in Treatment: 0 Information Obtained From Patient Constitutional Symptoms (General Health) Complaints and Symptoms: Negative for: Fatigue; Fever; Chills; Marked Weight  Change Eyes Complaints and Symptoms: Negative for: Dry Eyes; Vision Changes; Glasses / Contacts Medical History: Negative for: Cataracts; Glaucoma; Optic Neuritis Ear/Nose/Mouth/Throat Complaints and Symptoms: Negative for: Chronic sinus problems or rhinitis Medical History: Negative for: Chronic sinus problems/congestion; Middle ear problems Respiratory Complaints and Symptoms: Negative for: Chronic or frequent coughs; Shortness of Breath Medical History: Negative for: Aspiration; Asthma; Chronic Obstructive Pulmonary Disease (COPD); Pneumothorax; Sleep Apnea; Tuberculosis Cardiovascular Complaints and Symptoms: Negative for: Chest pain Medical History: Positive for: Hypertension Negative for: Angina; Arrhythmia; Congestive Heart Failure; Coronary Artery Disease; Deep Vein Thrombosis; Hypotension; Myocardial Infarction; Peripheral Arterial Disease; Peripheral Venous Disease; Phlebitis; Vasculitis Gastrointestinal Complaints and Symptoms: Negative for: Frequent diarrhea; Nausea; Vomiting Medical History: Negative for: Cirrhosis ; Colitis; Crohns; Hepatitis A; Hepatitis B; Hepatitis C Endocrine Complaints and Symptoms: Negative for: Heat/cold intolerance Medical History: Negative for: Type I Diabetes; Type II Diabetes Genitourinary Complaints and Symptoms: Negative for: Frequent urination Medical History: Negative for: End Stage Renal Disease Integumentary (Skin) Complaints and Symptoms: Positive for: Wounds Medical History: Negative for: History of Burn Musculoskeletal Complaints and Symptoms: Negative for: Muscle Pain; Muscle Weakness Medical History: Negative for: Gout; Rheumatoid Arthritis; Osteoarthritis; Osteomyelitis Neurologic Complaints and Symptoms: Negative for: Numbness/parasthesias Medical History: Negative for: Dementia; Neuropathy; Quadriplegia; Paraplegia; Seizure Disorder Psychiatric Complaints and Symptoms: Negative for: Claustrophobia;  Suicidal Medical History: Negative for: Anorexia/bulimia; Confinement Anxiety Hematologic/Lymphatic Medical History: Negative for: Anemia; Hemophilia; Human Immunodeficiency Virus; Lymphedema; Sickle Cell Disease Immunological Medical History: Negative for: Lupus Erythematosus; Raynauds; Scleroderma Oncologic Medical History: Negative for: Received Chemotherapy; Received Radiation Immunizations Pneumococcal Vaccine: Received Pneumococcal Vaccination: No Implantable Devices None Family and Social History Cancer: Yes - Siblings; Diabetes: Yes - Siblings; Heart Disease: Yes - Father; Hereditary Spherocytosis: No; Hypertension: Yes - Mother,Father; Kidney Disease: No; Lung Disease: Yes - Siblings; Seizures: No; Stroke: Yes - Mother; Thyroid Problems: No; Tuberculosis: No; Never smoker; Marital Status - Widowed; Alcohol Use: Never; Drug Use: No History; Caffeine Use: Rarely; Financial Concerns: No; Food, Clothing or Shelter Needs: No; Support System Lacking: No; Transportation Concerns: No Electronic Signature(s) Signed: 10/29/2020 12:52:04 PM By: Linton Ham MD Signed: 10/29/2020 4:55:20 PM By: Carlene Coria RN Entered By: Carlene Coria on 10/29/2020 11:15:04 -------------------------------------------------------------------------------- SuperBill Details Patient Name: Date of Service: Jackie [name], [place]. 10/29/2020 Medical Record Number: [id number] Patient Account Number: [id number] Date of Birth/Sex: Treating RN: [birth year] (  86 y.o. Jackie [name] Primary Care Provider: [name] Other Clinician: Referring Provider: Treating Provider/Extender: [name] in Treatment: 0 Diagnosis Coding ICD-10 Codes Code Description [phone] Chronic venous hypertension (idiopathic) with ulcer and inflammation of left lower extremity I89.0 Lymphedema, not elsewhere classified L97.321 Non-pressure chronic ulcer of left ankle limited to breakdown of  skin Facility Procedures CPT4 Code: [id number] Description: [facility] VISIT-LEV 3 EST PT Modifier: Quantity: 1 CPT4 Code: 94503888 Description: (Facility Use Only) 29581LT - [place] LWR LT LEG Modifier: Quantity: 1 Physician Procedures : CPT4 Code Description Modifier [id number] - WC PHYS LEVEL 4 - EST PT ICD-10 Diagnosis Description I87.332 Chronic venous hypertension (idiopathic) with ulcer and inflammation of left lower extremity I89.0 Lymphedema, not elsewhere classified L97.321  Non-pressure chronic ulcer of left ankle limited to breakdown of skin Quantity: 1 Electronic Signature(s) Signed: 10/29/2020 12:52:04 PM By: Linton Ham MD Entered By: Linton Ham on 10/29/2020 12:50:22

## 2020-10-30 IMAGING — US US EXTREM LOW VENOUS
1 series · 12 of 24 positions shown · non-contrast
Comparison: None.

CLINICAL DATA: 88-year-old female with recurring venous ulcer left
medial lower leg



[Series 1: us extrem low venous · 0.08mm/px · 12 of 52 slices shown]
[im 3/52]
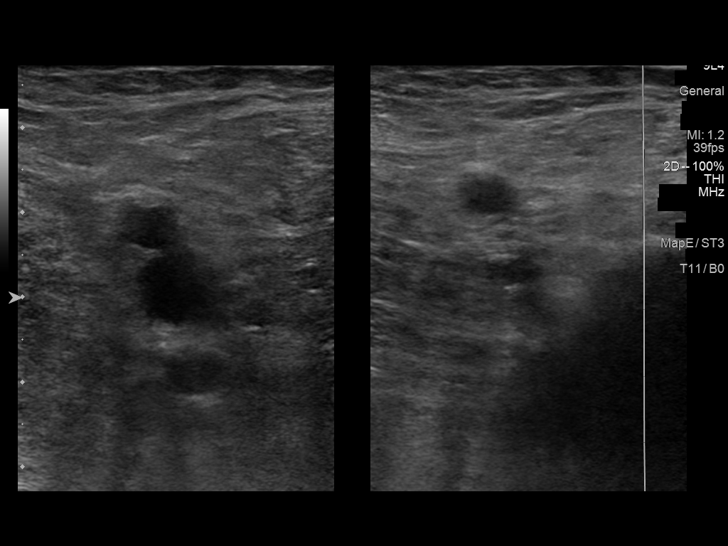
[im 7/52]
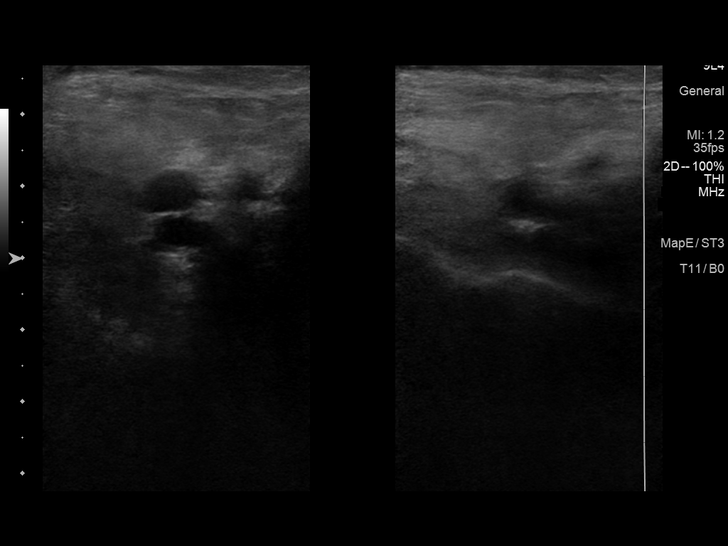
[im 12/52]
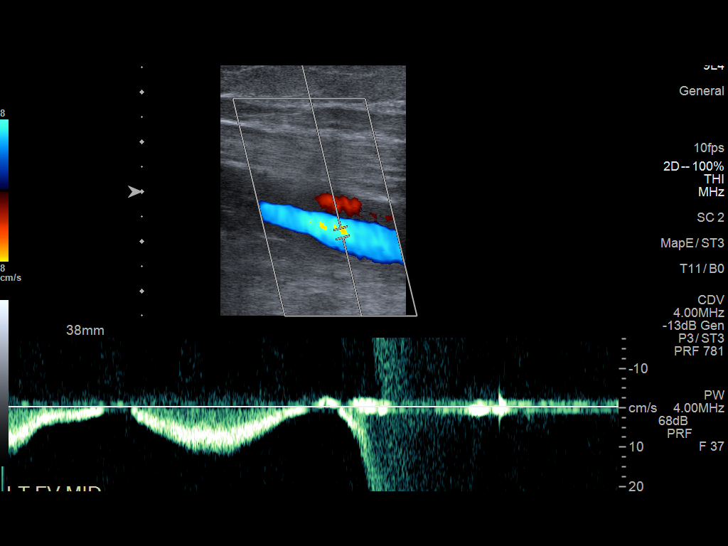
[im 16/52]
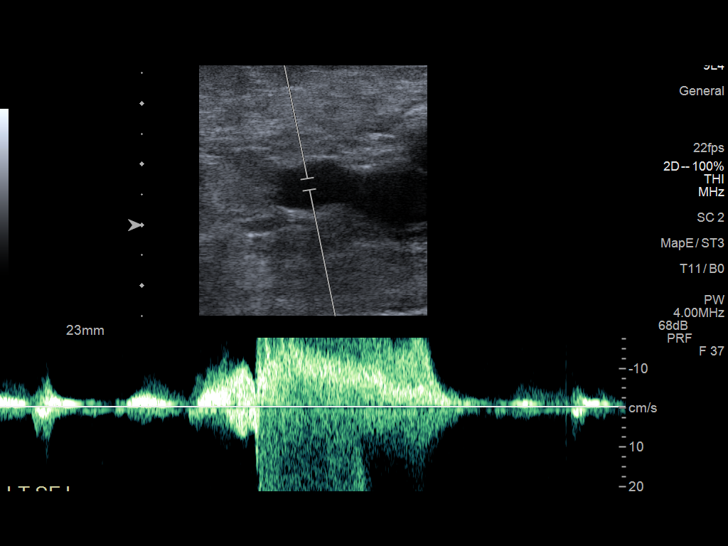
[im 20/52]
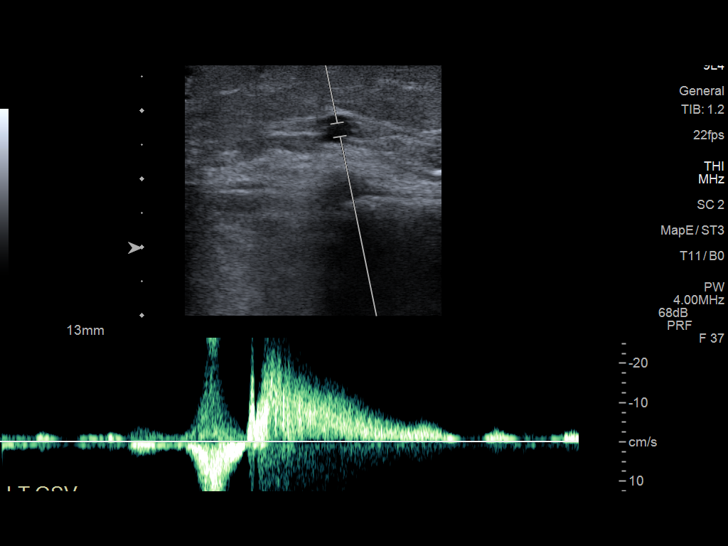
[im 25/52]
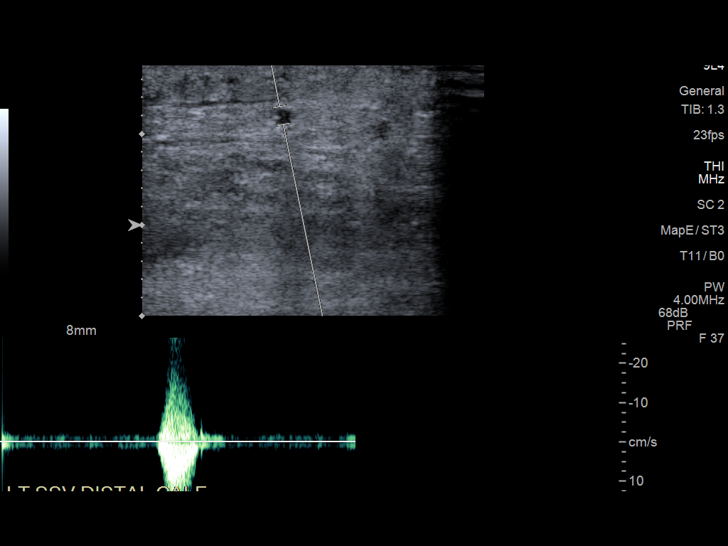
[im 29/52]
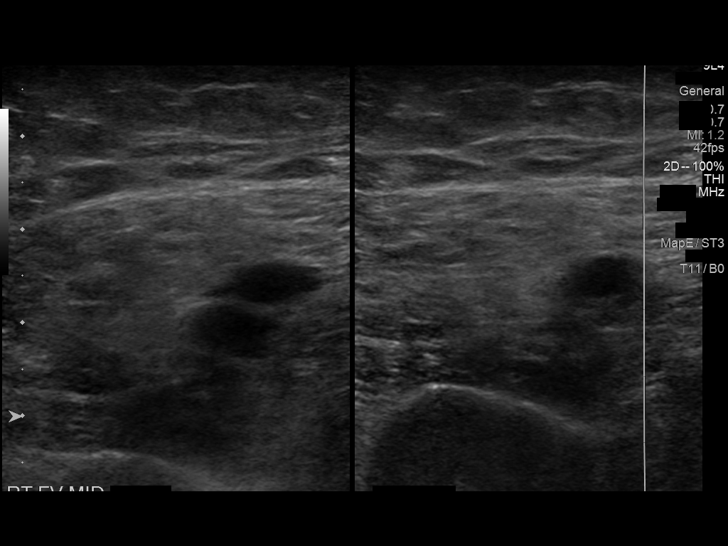
[im 34/52]
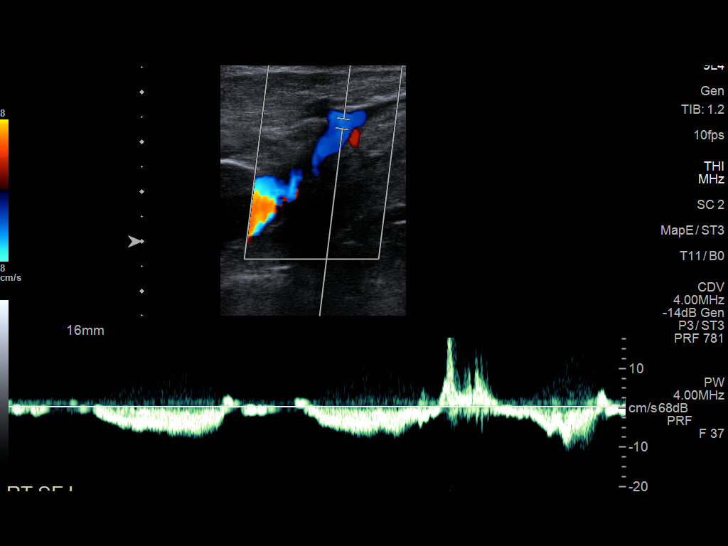
[im 38/52]
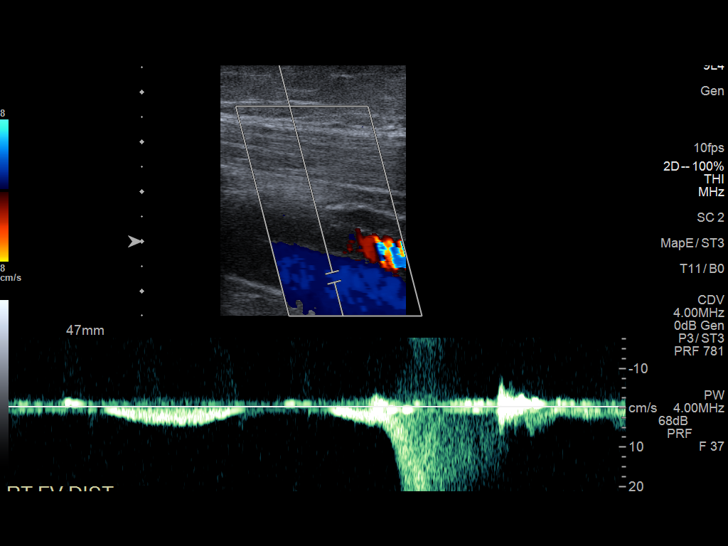
[im 43/52]
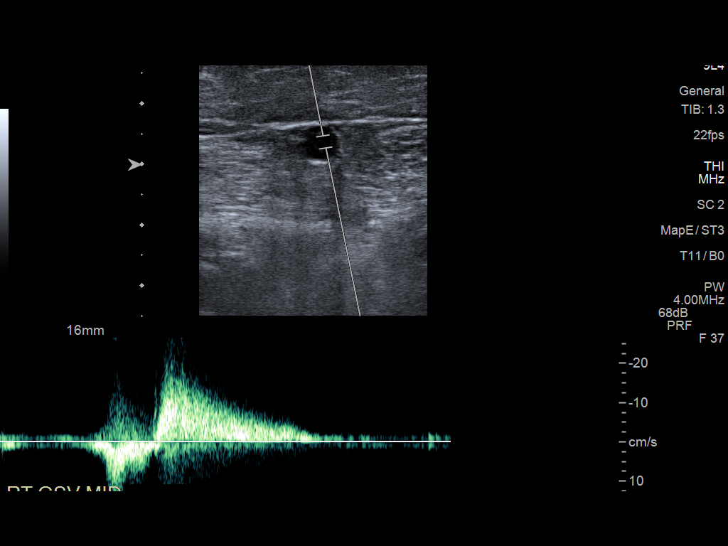
[im 47/52]
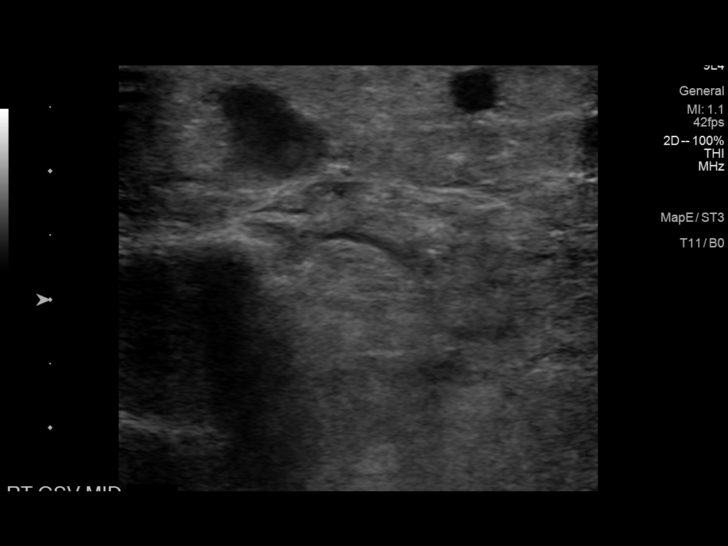
[im 52/52]
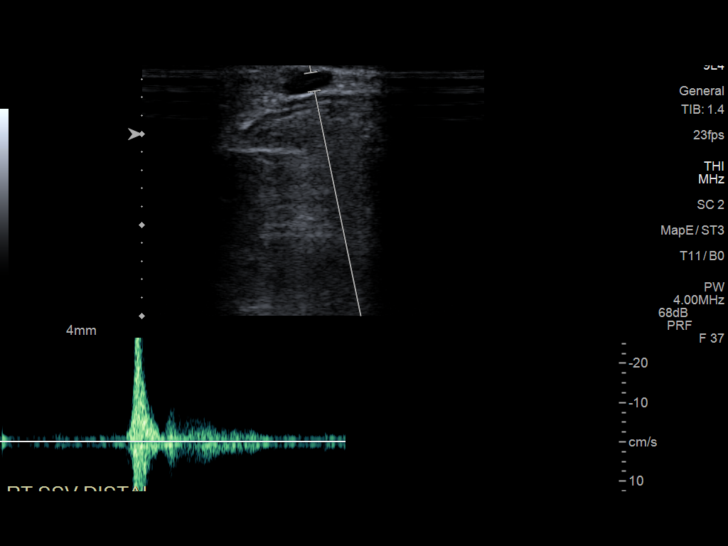

[12 of 24 positions shown; findings below may reference images not displayed]

FINDINGS: RIGHT LOWER EXTREMITY

Common Femoral Vein: No evidence of thrombus. Normal
compressibility, respiratory phasicity and response to augmentation.

Saphenofemoral Junction: No evidence of thrombus. Normal
compressibility and flow on color Doppler imaging.

Profunda Femoral Vein: No evidence of thrombus. Normal
compressibility and flow on color Doppler imaging.

Femoral Vein: No evidence of thrombus. Normal compressibility,
respiratory phasicity and response to augmentation.

Popliteal Vein: No evidence of thrombus. Normal compressibility,
respiratory phasicity and response to augmentation.

Calf Veins: No evidence of thrombus. Normal compressibility and flow
on color Doppler imaging.

GSV at SFJ:  Diameter = 9mm, Reflux = 0 sec

GSV at Proximal Thigh: Diameter = 5mm, Reflux = 2 sec

GSV at Mid Thigh: Diameter = 5mm, Reflux = 2 sec

GSV at Distal Thigh: Diameter = 5mm, Reflux = 2.5 sec

GSV at Prox Calf: Diameter = 3mm, Reflux = 3 sec

GSV at Mid Calf: The great saphenous vein in the mid calf and distal
calf not identified, with a network collateral varicose veins
obscuring the native calf great saphenous vein.

SSV at Sapheno popliteal junction: Diameter = 2mm, Reflux = 0 sec

SSV at mid calf: Diameter = 2mm, Reflux = 0 sec

SSF at distal calf: Diameter = 2mm, Reflux = 0 sec

Varicosities:  Multiple

Other Findings:  None.

LEFT LOWER EXTREMITY

Common Femoral Vein: No evidence of thrombus. Normal
compressibility, respiratory phasicity and response to augmentation.

Saphenofemoral Junction: No evidence of thrombus. Normal
compressibility and flow on color Doppler imaging.

Profunda Femoral Vein: No evidence of thrombus. Normal
compressibility and flow on color Doppler imaging.

Femoral Vein: No evidence of thrombus. Normal compressibility,
respiratory phasicity and response to augmentation.

Popliteal Vein: No evidence of thrombus. Normal compressibility,
respiratory phasicity and response to augmentation.

Calf Veins: No evidence of thrombus. Normal compressibility and flow
on color Doppler imaging.

GSV at SFJ:  Diameter = 8mm, Reflux = 0 sec

GSV at Proximal Thigh: Diameter = 7mm, Reflux = 2.5 sec

GSV at Mid Thigh: Diameter = 5mm, Reflux = 2.5 sec

GSV at Distal Thigh: Diameter = 5mm, Reflux = 2.5 sec

GSV at Prox Calf: Diameter = 4-5mm, Reflux = 2 sec

GSV at Mid Calf: Diameter = 4-5mm, Reflux = 2 sec

GSV at Distal Calf: Diameter = 6mm, Reflux = 2 sec

SSV at Sapheno popliteal junction: Diameter = 1-2mm, Reflux = 0 sec

SSV at mid calf: Diameter = 1-2mm, Reflux = 0 sec

SSF at distal calf: Diameter = 1-2mm, Reflux = 0 sec

Varicosities:  Multiple

Other Findings:  None.
IMPRESSION: Sonographic survey of the bilateral lower extremities negative for
DVT.

Significant reflux of the bilateral great saphenous vein. On the
right there is reflux from the saphenofemoral junction to the
proximal calf, where there is further decompression through network
of varicosities. On the left there is reflux from the saphenofemoral
junction to the distal calf. On the left there is also varicose
network decompressing the reflux

## 2020-10-30 NOTE — Progress Notes (Signed)
Jackie [name] [place]. ([id number]) Visit Report for 10/29/2020 Allergy List Details Patient Name: Date of Service: Jackie [name] CE M. 10/29/2020 10:30 A M Medical Record Number: [id number] Patient Account Number: [id number] Date of Birth/Sex: Treating RN: [birth year] (86 y.o. Jackie [name] Primary Care Jackie [name]: Jackie [name] Other Clinician: Referring Jackie [name]: Treating Jackie [name]/Extender: Jackie [name] in Treatment: 0 Allergies Active Allergies codeine Zithromax Allergy Notes Electronic Signature(s) Signed: 10/29/2020 4:55:20 PM By: Jackie Coria RN Entered By: Jackie [name] on 10/29/2020 11:14:12 -------------------------------------------------------------------------------- Arrival Information Details Patient Name: Date of Service: Jackie [name], [place]. 10/29/2020 10:30 A M Medical Record Number: [id number] Patient Account Number: [id number] Date of Birth/Sex: Treating RN: 1932/09/17 (86 y.o. Jackie [name] Primary Care Jackie [name]: Jackie [name] Other Clinician: Referring Jackie [name]: Treating Jackie [name]/Extender: Jackie [name] in Treatment: 0 Visit Information Patient Arrived: Jackie [name] Time: 11:03 Accompanied By: son Transfer Assistance: None Patient Identification Verified: Yes Secondary Verification Process Completed: Yes Patient Requires Transmission-Based Precautions: No Patient Has Alerts: Yes Patient Alerts: ABI: L 1.13 07/2020 History Since Last Visit All ordered tests and consults were completed: No Added or deleted any medications: No Any new allergies or adverse reactions: No Had a fall or experienced change in activities of daily living that may affect risk of falls: No Signs or symptoms of abuse/neglect since last visito No Hospitalized since last visit: No Implantable device outside of the clinic excluding cellular tissue based products placed in the center since last visit: No Has Dressing in Place as Prescribed:  Yes Electronic Signature(s) Signed: 10/29/2020 5:54:37 PM By: Deon Pilling Entered By: Deon Pilling on 10/29/2020 11:53:17 -------------------------------------------------------------------------------- Clinic Level of Care Assessment Details Patient Name: Date of Service: Jackie [name] CE M. 10/29/2020 10:30 A M Medical Record Number: [id number] Patient Account Number: [id number] Date of Birth/Sex: Treating RN: 02-10-1932 (86 y.o. Jackie [name] Primary Care Edson Deridder: Jackie [name] Other Clinician: Referring Samin Milke: Treating Jackie [name]/Extender: Jackie [name] in Treatment: 0 Clinic Level of Care Assessment Items TOOL 1 Quantity Score X- 1 0 Use when EandM and Procedure is performed on INITIAL visit ASSESSMENTS - Nursing Assessment / Reassessment X- 1 20 General Physical Exam (combine w/ comprehensive assessment (listed just below) when performed on new pt. evals) X- 1 25 Comprehensive Assessment (HX, ROS, Risk Assessments, Wounds Hx, etc.) ASSESSMENTS - Wound and Skin Assessment / Reassessment X- 1 10 Dermatologic / Skin Assessment (not related to wound area) ASSESSMENTS - Ostomy and/or Continence Assessment and Care []  - 0 Incontinence Assessment and Management []  - 0 Ostomy Care Assessment and Management (repouching, etc.) PROCESS - Coordination of Care X - Simple Patient / Family Education for ongoing care 1 15 []  - 0 Complex (extensive) Patient / Family Education for ongoing care X- 1 10 Staff obtains Programmer, systems, Records, T Results / Process Orders est []  - 0 Staff telephones HHA, Nursing Homes / Clarify orders / etc []  - 0 Routine Transfer to another Facility (non-emergent condition) []  - 0 Routine Hospital Admission (non-emergent condition) X- 1 15 New Admissions / Biomedical engineer / Ordering NPWT Apligraf, etc. , []  - 0 Emergency Hospital Admission (emergent condition) PROCESS - Special Needs []  - 0 Pediatric / Minor Patient  Management []  - 0 Isolation Patient Management []  - 0 Hearing / Language / Visual special needs []  - 0 Assessment of Community assistance (transportation, D/C planning, etc.) []  - 0 Additional assistance / Altered mentation []  - 0 Support Surface(s) Assessment (bed, cushion, seat,  etc.) INTERVENTIONS - Miscellaneous []  - 0 External ear exam []  - 0 Patient Transfer (multiple staff / Civil Service fast streamer / Similar devices) []  - 0 Simple Staple / Suture removal (25 or less) []  - 0 Complex Staple / Suture removal (26 or more) []  - 0 Hypo/Hyperglycemic Management (do not check if billed separately) []  - 0 Ankle / Brachial Index (ABI) - do not check if billed separately Has the patient been seen at the hospital within the last three years: Yes Total Score: 95 Level Of Care: New/Established - Level 3 Electronic Signature(s) Signed: 10/29/2020 5:54:37 PM By: Deon Pilling Signed: 10/29/2020 5:54:37 PM By: Deon Pilling Entered By: Deon Pilling on 10/29/2020 11:57:45 -------------------------------------------------------------------------------- Compression Therapy Details Patient Name: Date of Service: Jackie [name], [name]. 10/29/2020 10:30 A M Medical Record Number: [id number] Patient Account Number: [id number] Date of Birth/Sex: Treating RN: 04-22-1932 (86 y.o. Jackie [name] Primary Care Aviendha Azbell: Jackie [name] Other Clinician: Referring Shamarie Call: Treating Abygayle Deltoro/Extender: Jackie [name] in Treatment: 0 Compression Therapy Performed for Wound Assessment: Wound #2 Left,Medial Lower Leg Performed By: Clinician Baruch Gouty, RN Compression Type: Three Layer Pre Treatment ABI: 1.1 Post Procedure Diagnosis Same as Pre-procedure Electronic Signature(s) Signed: 10/29/2020 5:54:37 PM By: Deon Pilling Entered By: Deon Pilling on 10/29/2020 11:53:04 -------------------------------------------------------------------------------- Encounter Discharge Information  Details Patient Name: Date of Service: Jackie [name], [place]. 10/29/2020 10:30 A M Medical Record Number: [id number] Patient Account Number: [id number] Date of Birth/Sex: Treating RN: [birth year] (86 y.o. [name] Primary Care Amarionna Arca: Jackie [name] Other Clinician: Referring Edyth Glomb: Treating Harjas Biggins/Extender: Jackie [name] in Treatment: 0 Encounter Discharge Information Items Discharge Condition: Stable Ambulatory Status: Cane Discharge Destination: Home Transportation: Private Auto Accompanied By: son Schedule Follow-up Appointment: Yes Clinical Summary of Care: Patient Declined Electronic Signature(s) Signed: 10/30/2020 4:47:23 PM By: Levan Hurst RN, BSN Entered By: Levan Hurst on 10/29/2020 14:48:32 -------------------------------------------------------------------------------- Lower Extremity Assessment Details Patient Name: Date of Service: Jackie [name], Jackie [place]. 10/29/2020 10:30 A M Medical Record Number: [id number] Patient Account Number: [id number] Date of Birth/Sex: Treating RN: 1932/01/07 (86 y.o. Jackie [name] Primary Care Earland Reish: Jackie [name] Other Clinician: Referring Jimia Gentles: Treating Karmyn Lowman/Extender: Jackie [name] in Treatment: 0 Edema Assessment Assessed: [name]: No] [Right: No] E[Left: dema] [Right: :] Calf Left: Right: Point of Measurement: 38 cm From Medial Instep 38 cm Ankle Left: Right: Point of Measurement: 9 cm From Medial Instep 22.5 cm Electronic Signature(s) Signed: 10/29/2020 4:55:20 PM By: Jackie Coria RN Entered By: Jackie [name] on 10/29/2020 11:27:31 -------------------------------------------------------------------------------- Multi Wound Chart Details Patient Name: Date of Service: Jackie [name], [place]. 10/29/2020 10:30 A M Medical Record Number: [id number] Patient Account Number: [id number] Date of Birth/Sex: Treating RN: 1932-07-23 (86 y.o. Jackie [name] Primary  Care Sweta Halseth: Jackie [name] Other Clinician: Referring Verina Galeno: Treating Phelan Goers/Extender: Jackie [name] in Treatment: 0 Vital Signs Height(in): 61 Pulse(bpm): 69 Weight(lbs): 126 Blood Pressure(mmHg): 154/70 Body Mass Index(BMI): 24 Temperature(F): 97.7 Respiratory Rate(breaths/min): 18 Photos: [2:No Photos Left, Medial Lower Leg] [N/A:N/A N/A] Wound Location: [2:Gradually Appeared] [N/A:N/A] Wounding Event: [2:Lymphedema] [N/A:N/A] Primary Etiology: [2:10/24/2020] [N/A:N/A] Date Acquired: [2:0] [N/A:N/A] Weeks of Treatment: [2:Open] [N/A:N/A] Wound Status: [2:8x6.5x0.1] [N/A:N/A] Measurements L x W x D (cm) [2:40.841] [N/A:N/A] A (cm) : rea [2:4.084] [N/A:N/A] Volume (cm) : [2:Compression Therapy] [N/A:N/A] Treatment Notes Electronic Signature(s) Signed: 10/29/2020 12:52:04 PM By: Linton Ham MD Signed: 10/29/2020 5:54:37 PM By: Deon Pilling Entered By: Linton Ham on 10/29/2020  12:31:58 -------------------------------------------------------------------------------- Multi-Disciplinary Care Plan Details Patient Name: Date of Service: Jackie [name] CE M. 10/29/2020 10:30 A M Medical Record Number: [id number] Patient Account Number: [id number] Date of Birth/Sex: Treating RN: 1932-09-27 (85 y.o. [name], [name] Primary Care Brooks Kinnan: Jackie [name] Other Clinician: Referring Broady Lafoy: Treating Georgena Weisheit/Extender: Jackie [name] in Treatment: 0 Active Inactive Orientation to the Wound Care Program Nursing Diagnoses: Knowledge deficit related to the wound healing center program Goals: Patient/caregiver will verbalize understanding of the [place] Date Initiated: 10/29/2020 Target Resolution Date: 12/04/2020 Goal Status: Active Interventions: Provide education on orientation to the wound center Notes: Pain, Acute or Chronic Nursing Diagnoses: Pain, acute or chronic: actual or  potential Potential alteration in comfort, pain Goals: Patient will verbalize adequate pain control and receive pain control interventions during procedures as needed Date Initiated: 10/29/2020 Target Resolution Date: 12/04/2020 Goal Status: Active Patient/caregiver will verbalize comfort level met Date Initiated: 10/29/2020 Target Resolution Date: 12/03/2020 Goal Status: Active Interventions: Provide education on pain management Reposition patient for comfort Treatment Activities: Administer pain control measures as ordered : 10/29/2020 Notes: Wound/Skin Impairment Nursing Diagnoses: Knowledge deficit related to ulceration/compromised skin integrity Goals: Patient/caregiver will verbalize understanding of skin care regimen Date Initiated: 10/29/2020 Target Resolution Date: 12/04/2020 Goal Status: Active Interventions: Assess patient/caregiver ability to obtain necessary supplies Assess patient/caregiver ability to perform ulcer/skin care regimen upon admission and as needed Provide education on ulcer and skin care Treatment Activities: Skin care regimen initiated : 10/29/2020 Topical wound management initiated : 10/29/2020 Notes: Electronic Signature(s) Signed: 10/29/2020 5:54:37 PM By: Deon Pilling Entered By: Deon Pilling on 10/29/2020 11:56:33 -------------------------------------------------------------------------------- Pain Assessment Details Patient Name: Date of Service: Jackie [name], [name]. 10/29/2020 10:30 A M Medical Record Number: [id number] Patient Account Number: [id number] Date of Birth/Sex: Treating RN: May 03, 1932 (86 y.o. Jackie [name] Primary Care Ryatt Corsino: Jackie [name] Other Clinician: Referring Parys Elenbaas: Treating Tavaughn Silguero/Extender: Jackie [name] in Treatment: 0 Active Problems Location of Pain Severity and Description of Pain Patient Has Paino No Site Locations Pain Management and Medication Current Pain Management: Electronic  Signature(s) Signed: 10/29/2020 4:55:20 PM By: Jackie Coria RN Entered By: Jackie [name] on 10/29/2020 11:28:38 -------------------------------------------------------------------------------- Patient/Caregiver Education Details Patient Name: Date of Service: Jackie [name], [place]. 12/2/2021andnbsp10:30 A M Medical Record Number: [id number] Patient Account Number: [id number] Date of Birth/Gender: Treating RN: 1932-04-07 (86 y.o. Jackie [name] Primary Care Physician: Jackie [name] Other Clinician: Referring Physician: Treating Physician/Extender: Jackie [name] in Treatment: 0 Education Assessment Education Provided To: Patient Education Topics Provided Welcome T [facility] [place]: o Handouts: Welcome T [facility] [place] o Methods: Explain/Verbal Responses: Reinforcements needed Electronic Signature(s) Signed: 10/29/2020 5:54:37 PM By: Deon Pilling Entered By: Deon Pilling on 10/29/2020 11:56:40 -------------------------------------------------------------------------------- Wound Assessment Details Patient Name: Date of Service: Jackie [name], [place]. 10/29/2020 10:30 A M Medical Record Number: [id number] Patient Account Number: [id number] Date of Birth/Sex: Treating RN: 09-24-1932 (86 y.o. Jackie [name] Primary Care Traveon Louro: Jackie [name] Other Clinician: Referring Caisen Mangas: Treating Janyla Biscoe/Extender: Jackie [name] in Treatment: 0 Wound Status Wound Number: 2 Primary Etiology: Lymphedema Wound Location: Left, Medial Lower Leg Wound Status: Open Wounding Event: Gradually Appeared Date Acquired: 10/24/2020 Weeks Of Treatment: 0 Clustered Wound: No Wound Measurements Length: (cm) 8 Width: (cm) 6.5 Depth: (cm) 0.1 Area: (cm) 40.841 Volume: (cm) 4.084 % Reduction in Area: % Reduction in Volume: Treatment Notes Wound #2 (Lower Leg) Wound Laterality:  Left, Medial Cleanser Peri-Wound Care Triamcinolone 15  (g) Discharge Instruction: Use triamcinolone 15 (g) as directed. mixed with lotion. Zinc Oxide Ointment 30g tube Discharge Instruction: Apply Zinc Oxide to periwound with each dressing change. As needed for maceration, wetness, or redness. Topical Primary Dressing KerraCel Ag Gelling Fiber Dressing, 4x5 in (silver alginate) Discharge Instruction: Apply silver alginate to wound bed as instructed Secondary Dressing Woven Gauze Sponge, Non-Sterile 4x4 in Discharge Instruction: Apply over primary dressing as directed. ABD Pad, 8x10 Discharge Instruction: Apply over primary dressing as directed. Zetuvit Plus 4x8 in Discharge Instruction: Apply over primary dressing as directed. Secured With Compression Wrap ThreePress (3 layer compression wrap) Discharge Instruction: Apply three layer compression as directed. Compression Stockings Add-Ons Electronic Signature(s) Signed: 10/29/2020 4:55:20 PM By: Jackie Coria RN Entered By: Jackie [name] on 10/29/2020 11:23:29 -------------------------------------------------------------------------------- Vitals Details Patient Name: Date of Service: Jackie [name], [place]. 10/29/2020 10:30 A M Medical Record Number: [id number] Patient Account Number: [id number] Date of Birth/Sex: Treating RN: [birth year] (86 y.o. Jackie [name] Primary Care Maui Britten: Jackie [name] Other Clinician: Referring Tomeka Kantner: Treating Onia Shiflett/Extender: Jackie [name] in Treatment: 0 Vital Signs Time Taken: 11:12 Temperature (F): 97.7 Height (in): 61 Pulse (bpm): 69 Source: Stated Respiratory Rate (breaths/min): 18 Weight (lbs): 126 Blood Pressure (mmHg): 154/70 Body Mass Index (BMI): 23.8 Reference Range: 80 - 120 mg / dl Electronic Signature(s) Signed: 10/29/2020 4:55:20 PM By: Jackie Coria RN Entered By: Jackie [name] on 10/29/2020 11:13:29

## 2020-11-05 ENCOUNTER — Other Ambulatory Visit: Payer: Self-pay

## 2020-11-05 ENCOUNTER — Encounter (HOSPITAL_BASED_OUTPATIENT_CLINIC_OR_DEPARTMENT_OTHER): Payer: Medicare Other | Admitting: Internal Medicine

## 2020-11-05 DIAGNOSIS — I87332 Chronic venous hypertension (idiopathic) with ulcer and inflammation of left lower extremity: Secondary | ICD-10-CM | POA: Diagnosis not present

## 2020-11-05 NOTE — Progress Notes (Addendum)
JENIFER, STRUVE Hardwick. ([id number]) Visit Report for 11/05/2020 Arrival Information Details Patient Name: Date of Service: [name] CE M. 11/05/2020 10:30 A M Medical Record Number: [id number] Patient Account Number: [id number] Date of Birth/Sex: Treating RN: Dec 24, 1931 (86 y.o. [name], [name] Primary Care Mitcheal Sweetin: [name] Other Clinician: Referring Lovada Barwick: Treating Tonique Mendonca/Extender: [name] in Treatment: 1 Visit Information History Since Last Visit Added or deleted any medications: No Patient Arrived: [name] Any new allergies or adverse reactions: No Arrival Time: 10:55 Had a fall or experienced change in No Accompanied By: son activities of daily living that may affect Transfer Assistance: None risk of falls: Patient Identification Verified: Yes Signs or symptoms of abuse/neglect since last visito No Secondary Verification Process Completed: Yes Hospitalized since last visit: No Patient Requires Transmission-Based Precautions: No Implantable device outside of the clinic excluding No Patient Has Alerts: Yes cellular tissue based products placed in the center Patient Alerts: ABI: L 1.13 07/2020 since last visit: Pain Present Now: No Electronic Signature(s) Signed: 11/05/2020 3:21:47 PM By: Mikeal Hawthorne EMT/HBOT/SD Entered By: Mikeal Hawthorne on 11/05/2020 10:56:20 -------------------------------------------------------------------------------- Clinic Level of Care Assessment Details Patient Name: Date of Service: [name], [place]. 11/05/2020 10:30 A M Medical Record Number: [id number] Patient Account Number: [id number] Date of Birth/Sex: Treating RN: Jun 14, 1932 (86 y.o. [name], [place] Primary Care Jakyla Reza: [name] Other Clinician: Referring Theona Muhs: Treating Marye Eagen/Extender: [name] in Treatment: 1 Clinic Level of Care Assessment Items TOOL 4 Quantity Score X- 1 0 Use when only an EandM  is performed on FOLLOW-UP visit ASSESSMENTS - Nursing Assessment / Reassessment X- 1 10 Reassessment of Co-morbidities (includes updates in patient status) X- 1 5 Reassessment of Adherence to Treatment Plan ASSESSMENTS - Wound and Skin A ssessment / Reassessment X - Simple Wound Assessment / Reassessment - one wound 1 5 []  - 0 Complex Wound Assessment / Reassessment - multiple wounds X- 1 10 Dermatologic / Skin Assessment (not related to wound area) ASSESSMENTS - Focused Assessment X- 1 5 Circumferential Edema Measurements - multi extremities X- 1 10 Nutritional Assessment / Counseling / Intervention X- 1 5 Lower Extremity Assessment (monofilament, tuning fork, pulses) []  - 0 Peripheral Arterial Disease Assessment (using hand held doppler) ASSESSMENTS - Ostomy and/or Continence Assessment and Care []  - 0 Incontinence Assessment and Management []  - 0 Ostomy Care Assessment and Management (repouching, etc.) PROCESS - Coordination of Care X - Simple Patient / Family Education for ongoing care 1 15 []  - 0 Complex (extensive) Patient / Family Education for ongoing care X- 1 10 Staff obtains Programmer, systems, Records, T Results / Process Orders est []  - 0 Staff telephones HHA, Nursing Homes / Clarify orders / etc []  - 0 Routine Transfer to another Facility (non-emergent condition) []  - 0 Routine Hospital Admission (non-emergent condition) []  - 0 New Admissions / Biomedical engineer / Ordering NPWT Apligraf, etc. , []  - 0 Emergency Hospital Admission (emergent condition) X- 1 10 Simple Discharge Coordination []  - 0 Complex (extensive) Discharge Coordination PROCESS - Special Needs []  - 0 Pediatric / Minor Patient Management []  - 0 Isolation Patient Management []  - 0 Hearing / Language / Visual special needs []  - 0 Assessment of Community assistance (transportation, D/C planning, etc.) []  - 0 Additional assistance / Altered mentation []  - 0 Support Surface(s)  Assessment (bed, cushion, seat, etc.) INTERVENTIONS - Wound Cleansing / Measurement X - Simple Wound Cleansing - one wound 1 5 []  - 0 Complex Wound  Cleansing - multiple wounds X- 1 5 Wound Imaging (photographs - any number of wounds) []  - 0 Wound Tracing (instead of photographs) X- 1 5 Simple Wound Measurement - one wound []  - 0 Complex Wound Measurement - multiple wounds INTERVENTIONS - Wound Dressings []  - 0 Small Wound Dressing one or multiple wounds []  - 0 Medium Wound Dressing one or multiple wounds []  - 0 Large Wound Dressing one or multiple wounds []  - 0 Application of Medications - topical []  - 0 Application of Medications - injection INTERVENTIONS - Miscellaneous []  - 0 External ear exam []  - 0 Specimen Collection (cultures, biopsies, blood, body fluids, etc.) []  - 0 Specimen(s) / Culture(s) sent or taken to Lab for analysis []  - 0 Patient Transfer (multiple staff / Civil Service fast streamer / Similar devices) []  - 0 Simple Staple / Suture removal (25 or less) []  - 0 Complex Staple / Suture removal (26 or more) []  - 0 Hypo / Hyperglycemic Management (close monitor of Blood Glucose) []  - 0 Ankle / Brachial Index (ABI) - do not check if billed separately X- 1 5 Vital Signs Has the patient been seen at the hospital within the last three years: Yes Total Score: 105 Level Of Care: New/Established - Level 3 Electronic Signature(s) Signed: 11/05/2020 3:27:39 PM By: Rhae Hammock RN Entered By: Rhae Hammock on 11/05/2020 11:28:17 -------------------------------------------------------------------------------- Lower Extremity Assessment Details Patient Name: Date of Service: [name], [place]. 11/05/2020 10:30 A M Medical Record Number: [id number] Patient Account Number: [id number] Date of Birth/Sex: Treating RN: [birth year] (86 y.o. [name] Primary Care Nandita Mathenia: [name] Other Clinician: Referring Cinthia Rodden: Treating Jozelynn Danielson/Extender: [name] in Treatment: 1 Edema Assessment Assessed: [name]: Yes] [Right: No] Edema: [Left: N] [Right: o] Calf Left: Right: Point of Measurement: 38 cm From Medial Instep 38 cm Ankle Left: Right: Point of Measurement: 9 cm From Medial Instep 22.5 cm Vascular Assessment Pulses: Dorsalis Pedis Palpable: [Left:Yes] Electronic Signature(s) Signed: 11/05/2020 3:21:47 PM By: Mikeal Hawthorne EMT/HBOT/SD Signed: 11/05/2020 4:11:46 PM By: Deon Pilling Entered By: Mikeal Hawthorne on 11/05/2020 11:00:32 -------------------------------------------------------------------------------- Multi Wound Chart Details Patient Name: Date of Service: [name], [place]. 11/05/2020 10:30 A M Medical Record Number: [id number] Patient Account Number: [id number] Date of Birth/Sex: Treating RN: 23-Feb-1932 (86 y.o. [name] Primary Care Timothea Bodenheimer: [name] Other Clinician: Referring Ashtian Villacis: Treating Paizlie Klaus/Extender: [name] in Treatment: 1 Vital Signs Height(in): 61 Pulse(bpm): 67 Weight(lbs): 126 Blood Pressure(mmHg): 161/75 Body Mass Index(BMI): 24 Temperature(F): 97.7 Respiratory Rate(breaths/min): 15 Photos: [2:No Photos Left, Medial Lower Leg] [N/A:N/A N/A] Wound Location: [2:Gradually Appeared] [N/A:N/A] Wounding Event: [2:Lymphedema] [N/A:N/A] Primary Etiology: [2:Hypertension] [N/A:N/A] Comorbid History: [2:10/24/2020] [N/A:N/A] Date Acquired: [2:1] [N/A:N/A] Weeks of Treatment: [2:Open] [N/A:N/A] Wound Status: [2:0x0x0] [N/A:N/A] Measurements L x W x D (cm) [2:0] [N/A:N/A] A (cm) : rea [2:0] [N/A:N/A] Volume (cm) : [2:100.00%] [N/A:N/A] % Reduction in A [2:rea: 100.00%] [N/A:N/A] % Reduction in Volume: [2:Partial Thickness] [N/A:N/A] Classification: [2:None Present] [N/A:N/A] Exudate A mount: [2:None Present (0%)] [N/A:N/A] Granulation A mount: [2:Large (67-100%)] [N/A:N/A] Treatment Notes Electronic  Signature(s) Signed: 11/05/2020 3:37:20 PM By: Linton Ham MD Signed: 11/05/2020 4:11:46 PM By: Deon Pilling Entered By: Linton Ham on 11/05/2020 12:34:12 -------------------------------------------------------------------------------- Multi-Disciplinary Care Plan Details Patient Name: Date of Service: [name], [place]. 11/05/2020 10:30 A M Medical Record Number: [id number] Patient Account Number: [id number] Date of Birth/Sex: Treating RN: March 25, 1932 (87 y.o. [name], [place] Primary Care Benny Deutschman: [name] Other  Clinician: Referring Particia Strahm: Treating Sophiarose Eades/Extender: [name] in Treatment: 1 Active Inactive Electronic Signature(s) Signed: 11/05/2020 11:27:40 AM By: Rhae Hammock RN Previous Signature: 11/05/2020 10:52:51 AM Version By: Rhae Hammock RN Entered By: Rhae Hammock on 11/05/2020 11:27:39 -------------------------------------------------------------------------------- Pain Assessment Details Patient Name: Date of Service: [name], [place]. 11/05/2020 10:30 A M Medical Record Number: [id number] Patient Account Number: [id number] Date of Birth/Sex: Treating RN: 05/13/1932 (86 y.o. [name] Primary Care Makaylynn Bonillas: [name] Other Clinician: Referring Bethan Adamek: Treating Yogesh Cominsky/Extender: [name] in Treatment: 1 Active Problems Location of Pain Severity and Description of Pain Patient Has Paino No Site Locations With Dressing Change: No Pain Management and Medication Current Pain Management: Electronic Signature(s) Signed: 11/05/2020 3:21:47 PM By: Mikeal Hawthorne EMT/HBOT/SD Signed: 11/05/2020 4:11:46 PM By: Deon Pilling Entered By: Mikeal Hawthorne on 11/05/2020 10:57:18 -------------------------------------------------------------------------------- Patient/Caregiver Education Details Patient Name: Date of Service: [name], [place]. 12/9/2021andnbsp10:30 A  M Medical Record Number: [id number] Patient Account Number: [id number] Date of Birth/Gender: Treating RN: 08/30/1932 (86 y.o. Benjaman Lobe Primary Care Physician: [name] Other Clinician: Referring Physician: Treating Physician/Extender: [name] in Treatment: 1 Education Assessment Education Provided To: Patient Education Topics Provided Pain: Methods: Explain/Verbal Responses: State content correctly Wound/Skin Impairment: Methods: Explain/Verbal Responses: State content correctly Electronic Signature(s) Signed: 11/05/2020 3:27:39 PM By: Rhae Hammock RN Entered By: Rhae Hammock on 11/05/2020 10:53:12 -------------------------------------------------------------------------------- Wound Assessment Details Patient Name: Date of Service: [name], [place]. 11/05/2020 10:30 A M Medical Record Number: [id number] Patient Account Number: [id number] Date of Birth/Sex: Treating RN: 03/13/1932 (86 y.o. [name], [name] Primary Care Taraneh Metheney: [name] Other Clinician: Referring Talissa Apple: Treating Alexxis Mackert/Extender: [name] in Treatment: 1 Wound Status Wound Number: 2 Primary Etiology: Lymphedema Wound Location: Left, Medial Lower Leg Wound Status: Open Wounding Event: Gradually Appeared Comorbid History: Hypertension Date Acquired: 10/24/2020 Weeks Of Treatment: 1 Clustered Wound: No Wound Measurements Length: (cm) Width: (cm) Depth: (cm) Area: (cm) Volume: (cm) 0 % Reduction in Area: 100% 0 % Reduction in Volume: 100% 0 Epithelialization: Large (67-100%) 0 Tunneling: No 0 Undermining: No Wound Description Classification: Partial Thickness Exudate Amount: None Present Foul Odor After Cleansing: No Slough/Fibrino No Wound Bed Granulation Amount: None Present (0%) Electronic Signature(s) Signed: 11/05/2020 3:21:47 PM By: Mikeal Hawthorne EMT/HBOT/SD Signed: 11/05/2020 4:11:46 PM  By: Deon Pilling Entered By: Mikeal Hawthorne on 11/05/2020 11:01:46 -------------------------------------------------------------------------------- Vitals Details Patient Name: Date of Service: [name], [place]. 11/05/2020 10:30 A M Medical Record Number: [id number] Patient Account Number: [id number] Date of Birth/Sex: Treating RN: [birth year] (86 y.o. [name], [name] Primary Care Shivonne Schwartzman: [name] Other Clinician: Referring Jafeth Mustin: Treating Ryaan Vanwagoner/Extender: [name] in Treatment: 1 Vital Signs Time Taken: 11:00 Temperature (F): 97.7 Height (in): 61 Pulse (bpm): 67 Weight (lbs): 126 Respiratory Rate (breaths/min): 15 Body Mass Index (BMI): 23.8 Blood Pressure (mmHg): 161/75 Reference Range: 80 - 120 mg / dl Electronic Signature(s) Signed: 11/05/2020 3:21:47 PM By: Mikeal Hawthorne EMT/HBOT/SD Entered By: Mikeal Hawthorne on 11/05/2020 10:56:50

## 2020-11-05 NOTE — Progress Notes (Signed)
Jackie [name]. ([id number]) Visit Report for 11/05/2020 HPI Details Patient Name: Date of Service: Jackie [name] CE M. 11/05/2020 10:30 A M Medical Record Number: [id number] Patient Account Number: [id number] Date of Birth/Sex: Treating RN: 11/22/1932 (86 y.o. Jackie [name] Primary Care Provider: [name] Other Clinician: Referring Provider: Treating Provider/Extender: [name] in Treatment: 1 History of Present Illness HPI Description: ADMISSION 07/17/2020 This is an 86 year old woman who is referred by her nurse practitioner at [facility] who has been doing treatment for the wound on the right medial lower leg and ankle. Apparently 1 point a substantial wound however it is largely epithelialized now. Patient states its been there for about 5 or 6 weeks. On the background she has changes in the skin in this area I think the go back several years to when she had a left hip fracture. Nevertheless she does not have a wound history that I could elicit. As far as I know that they had been using TCA, Mepitel under an Unna boot they have been changing this weekly at [facility] [employer]. She also received a course of doxycycline. Past medical history very little that I can find in [place] link. She has a history of venous insufficiency bilaterally and a left hip fracture hypertension and a history of thyroidectomy for thyroid cancer. ABI in our clinic was 1.13 on the left 8/27; the patient is substantial initial wound by her description on the left medial lower leg and ankle is completely epithelialized. She has chronic venous insufficiency, dermatosclerosis and probably lymphedema. She does not have stockings and states that she would have trouble getting over the toe stockings on in any fashion because of hip problems except 9/3; this is a patient with chronic venous insufficiency with a particularly difficult area on her left  medial lower leg and ankle. My assessment of this is that she has chronic venous insufficiency and chronic venous hypertension. And at least on the left medial ankle stasis dermatitis. The area still looks angry and inflamed. Nevertheless it is epithelialized. I do not believe there is active infection. We have ordered her bilateral juxta lite stockings. These should be easier for her to put on but need to be adjusted to 30/40 compression. If this is not successful in maintaining this area I think she is going to need formal reflux studies to see if she might be a candidate for ablation of the greater saphenous vein 08/05/2020 upon evaluation today patient appears to be doing poorly in regard to her lower extremity. Unfortunately this has reopened after being healed on Friday. She tells me she was wearing her compression wrap but that even when she woke up in the morning on Saturday morning she was leaking. Fortunately there is no signs of active infection at this time systemically though there is some question of a thing locally. She does have juxta light compression bilaterally. 08/12/2020 on evaluation today patient appears to be doing well with regard to her leg ulcer which actually is closed again at this point. With that being said she is still having some issues currently with very thin skin that has come back over and again with her swelling if we do not wrap around afraid this is can open right back up. Nonetheless I do believe that she should proceed with the vascular testing next Thursday as previously ordered and recommended. Fortunately there is no signs of active infection at this time. 9/23; this is a patient  I discharged a few weeks ago. Apparently she had a reopening on the left leg medially. This is closed now. We have ordered venous reflux studies but they are not until 10/5 at [place] 10/29/2020 Patient is a now 86 year old female. She is somewhat frail  however still lives at home largely on her own. We had her in the clinic in August and September 2021 with a wound on the left medial lower extremity in the setting of chronic venous insufficiency and lymphedema. This closed reasonably easily and we discharged her to juxta lite stockings. After her discharge she had her venous reflux studies at interventional radiology. She was seen in consultation by Dr. Earleen Newport. She did not have evidence of thrombosis. She did have significant reflux of the bilateral greater saphenous vein on the right there was reflux from the saphenofemoral junction to the proximal calf where there is further decompression through network of varicosities on the left there was reflux from the saphenofemoral junction to the distal calf there is also a varicose network decompressing the calf. She was recommended I think for venous ablations of the greater saphenous vein on the left however she is wanting to defer that it into the new year. She tells me that very shortly after she left here the last time she was not able to put the juxta lite stocking on largely the inner sleeve. She developed swelling and then leakage and skin breakdown again in the left medial lower extremity. She was seen by podiatry on 3 occasions I think they were applying Unna boots. Her son says that at one point the [facility] boot was not put on properly his wife who is a nurse had to adjust this and the swelling is actually somewhat better. She arrives in clinic today with very significant swelling of the proximal two thirds of her lower extremity erythema superficial skin breakdown on the left medial ankle and calf and weeping edema fluid. Past medical history not much change from last time she has chronic venous insufficiency with very significant reflux and lymphedema. She states that she has trouble bending over to get the inner part of the juxta lite stocking on because of hip problems. Her ABI in this clinic  was not repeated but the last time she was here it was 1.13 she was not felt to have an arterial issue 12/9; the patient's wound on the left medial lower leg is again closed. She has chronic hemosiderin and stasis dermatitis left greater than right lipodermatosclerosis left greater than right. On the right leg today we are not wrapping she says she hit this on a screen door however there is no open area here. The right leg has a lot of swelling Electronic Signature(s) Signed: 11/05/2020 3:37:20 PM By: Linton Ham MD Entered By: Linton Ham on 11/05/2020 12:38:01 -------------------------------------------------------------------------------- Physical Exam Details Patient Name: Date of Service: Jackie [name], [place]. 11/05/2020 10:30 A M Medical Record Number: [id number] Patient Account Number: [id number] Date of Birth/Sex: Treating RN: 03/13/1932 (86 y.o. Jackie [name] Primary Care Provider: [name] Other Clinician: Referring Provider: Treating Provider/Extender: [name] in Treatment: 1 Constitutional Patient is hypertensive.. Pulse regular and within target range for patient.Marland Kitchen Respirations regular, non-labored and within target range.. Temperature is normal and within the target range for the patient.Marland Kitchen Appears in no distress. Cardiovascular Needle pulses are palpable. Notes Wound exam; the left medial ankle is closed. We have good control of her erythema. Still some erythema but  at this is nontender and compatible with chronic stasis Electronic Signature(s) Signed: 11/05/2020 3:37:20 PM By: Linton Ham MD Entered By: Linton Ham on 11/05/2020 12:38:58 -------------------------------------------------------------------------------- Physician Orders Details Patient Name: Date of Service: Jackie [name], [place]. 11/05/2020 10:30 A M Medical Record Number: [id number] Patient Account Number: [id number] Date of Birth/Sex: Treating RN: [birth year]  (86 y.o. Benjaman Lobe Primary Care Provider: [name] Other Clinician: Referring Provider: Treating Provider/Extender: [name] in Treatment: 1 Verbal / Phone Orders: No Diagnosis Coding Discharge From [facility] Services Discharge from [place] Edema Control - Lymphedema / SCD / Other Lymphedema Pumps. Use Lymphedema pumps on leg(s) 2-3 times a day for 45-60 minutes. If wearing any wraps or hose, do not remove them. Continue exercising as instructed. - Son to bring pumps over for pt. to use 2x a day for an hour each time to help with swelling. Elevate legs to the level of the heart or above for 30 minutes daily and/or when sitting, a frequency of: - 3-4 times throughout the day. Avoid standing for long periods of time. Exercise regularly Moisturize legs daily. Compression stocking or Garment 20-30 mm/Hg pressure to: - Juxtalite to both legs. Apply in the morning, take off at night. May use the long, loose socks underneath. Electronic Signature(s) Signed: 11/05/2020 3:27:39 PM By: Rhae Hammock RN Signed: 11/05/2020 3:37:20 PM By: Linton Ham MD Entered By: Rhae Hammock on 11/05/2020 11:22:11 -------------------------------------------------------------------------------- Problem List Details Patient Name: Date of Service: Jackie [name], [place]. 11/05/2020 10:30 A M Medical Record Number: [id number] Patient Account Number: [id number] Date of Birth/Sex: Treating RN: [birth year] (86 y.o. Jackie [name] Primary Care Provider: [name] Other Clinician: Referring Provider: Treating Provider/Extender: [name] in Treatment: 1 Active Problems ICD-10 Encounter Code Description Active Date MDM Diagnosis I87.332 Chronic venous hypertension (idiopathic) with ulcer and inflammation of left 10/29/2020 No Yes lower extremity I89.0 Lymphedema, not elsewhere classified 10/29/2020 No Yes L97.321 Non-pressure  chronic ulcer of left ankle limited to breakdown of skin 10/29/2020 No Yes Inactive Problems Resolved Problems Electronic Signature(s) Signed: 11/05/2020 3:37:20 PM By: Linton Ham MD Entered By: Linton Ham on 11/05/2020 12:34:02 -------------------------------------------------------------------------------- Progress Note Details Patient Name: Date of Service: Jackie [name], [place]. 11/05/2020 10:30 A M Medical Record Number: [id number] Patient Account Number: [id number] Date of Birth/Sex: Treating RN: 08-25-1932 (86 y.o. Jackie [name] Primary Care Provider: [name] Other Clinician: Referring Provider: Treating Provider/Extender: [name] in Treatment: 1 Subjective History of Present Illness (HPI) ADMISSION 07/17/2020 This is an 86 year old woman who is referred by her nurse practitioner at [facility] who has been doing treatment for the wound on the right medial lower leg and ankle. Apparently 1 point a substantial wound however it is largely epithelialized now. Patient states its been there for about 5 or 6 weeks. On the background she has changes in the skin in this area I think the go back several years to when she had a left hip fracture. Nevertheless she does not have a wound history that I could elicit. As far as I know that they had been using TCA, Mepitel under an Unna boot they have been changing this weekly at [facility]. She also received a course of doxycycline. Past medical history very little that I can find in [place] link. She has a history of venous insufficiency bilaterally and a left hip fracture hypertension and a history of  thyroidectomy for thyroid cancer. ABI in our clinic was 1.13 on the left 8/27; the patient is substantial initial wound by her description on the left medial lower leg and ankle is completely epithelialized. She has chronic venous insufficiency, dermatosclerosis and  probably lymphedema. She does not have stockings and states that she would have trouble getting over the toe stockings on in any fashion because of hip problems except 9/3; this is a patient with chronic venous insufficiency with a particularly difficult area on her left medial lower leg and ankle. My assessment of this is that she has chronic venous insufficiency and chronic venous hypertension. And at least on the left medial ankle stasis dermatitis. The area still looks angry and inflamed. Nevertheless it is epithelialized. I do not believe there is active infection. We have ordered her bilateral juxta lite stockings. These should be easier for her to put on but need to be adjusted to 30/40 compression. If this is not successful in maintaining this area I think she is going to need formal reflux studies to see if she might be a candidate for ablation of the greater saphenous vein 08/05/2020 upon evaluation today patient appears to be doing poorly in regard to her lower extremity. Unfortunately this has reopened after being healed on Friday. She tells me she was wearing her compression wrap but that even when she woke up in the morning on Saturday morning she was leaking. Fortunately there is no signs of active infection at this time systemically though there is some question of a thing locally. She does have juxta light compression bilaterally. 08/12/2020 on evaluation today patient appears to be doing well with regard to her leg ulcer which actually is closed again at this point. With that being said she is still having some issues currently with very thin skin that has come back over and again with her swelling if we do not wrap around afraid this is can open right back up. Nonetheless I do believe that she should proceed with the vascular testing next Thursday as previously ordered and recommended. Fortunately there is no signs of active infection at this time. 9/23; this is a patient I discharged  a few weeks ago. Apparently she had a reopening on the left leg medially. This is closed now. We have ordered venous reflux studies but they are not until 10/5 at [place] 10/29/2020 Patient is a now 86 year old female. She is somewhat frail however still lives at home largely on her own. We had her in the clinic in August and September 2021 with a wound on the left medial lower extremity in the setting of chronic venous insufficiency and lymphedema. This closed reasonably easily and we discharged her to juxta lite stockings. After her discharge she had her venous reflux studies at interventional radiology. She was seen in consultation by Dr. Earleen Newport. She did not have evidence of thrombosis. She did have significant reflux of the bilateral greater saphenous vein on the right there was reflux from the saphenofemoral junction to the proximal calf where there is further decompression through network of varicosities on the left there was reflux from the saphenofemoral junction to the distal calf there is also a varicose network decompressing the calf. She was recommended I think for venous ablations of the greater saphenous vein on the left however she is wanting to defer that it into the new year. She tells me that very shortly after she left here the last time she was not able to put  the juxta lite stocking on largely the inner sleeve. She developed swelling and then leakage and skin breakdown again in the left medial lower extremity. She was seen by podiatry on 3 occasions I think they were applying Unna boots. Her son says that at one point the [facility] boot was not put on properly his wife who is a nurse had to adjust this and the swelling is actually somewhat better. She arrives in clinic today with very significant swelling of the proximal two thirds of her lower extremity erythema superficial skin breakdown on the left medial ankle and calf and weeping edema fluid. Past medical  history not much change from last time she has chronic venous insufficiency with very significant reflux and lymphedema. She states that she has trouble bending over to get the inner part of the juxta lite stocking on because of hip problems. Her ABI in this clinic was not repeated but the last time she was here it was 1.13 she was not felt to have an arterial issue 12/9; the patient's wound on the left medial lower leg is again closed. She has chronic hemosiderin and stasis dermatitis left greater than right lipodermatosclerosis left greater than right. On the right leg today we are not wrapping she says she hit this on a screen door however there is no open area here. The right leg has a lot of swelling Objective Constitutional Patient is hypertensive.. Pulse regular and within target range for patient.Marland Kitchen Respirations regular, non-labored and within target range.. Temperature is normal and within the target range for the patient.Marland Kitchen Appears in no distress. Vitals Time Taken: 11:00 AM, Height: 61 in, Weight: 126 lbs, BMI: 23.8, Temperature: 97.7 F, Pulse: 67 bpm, Respiratory Rate: 15 breaths/min, Blood Pressure: 161/75 mmHg. Cardiovascular Needle pulses are palpable. General Notes: Wound exam; the left medial ankle is closed. We have good control of her erythema. Still some erythema but at this is nontender and compatible with chronic stasis Integumentary (Hair, Skin) Wound #2 status is Open. Original cause of wound was Gradually Appeared. The wound is located on the Left,Medial Lower Leg. The wound measures 0cm length x 0cm width x 0cm depth; 0cm^2 area and 0cm^3 volume. There is no tunneling or undermining noted. There is a none present amount of drainage noted. There is no granulation within the wound bed. 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 Use Lymphedema pumps on leg(s) 2-3 times a day for 45-60 minutes. If wearing any wraps or hose, do not remove them. Continue exercising as instructed. - Son to bring pumps over for pt. to use 2x a day for an hour each time to help with swelling. Elevate legs to the level of the heart or above for 30 minutes daily and/or when sitting, a frequency of: - 3-4 times throughout the day. Avoid standing for long periods of time. Exercise regularly Moisturize legs daily. Compression stocking or Garment 20-30 mm/Hg pressure to: - Juxtalite to both legs. Apply in the morning, take off at night. May use the long, loose socks underneath. 1. We are discharging the patient back in her juxta lite stockings which she brought in today. The inner layer of these are too tight for the patient's to apply so she has not really  been doing anything on her legs. She has a loose pair of socks which are long and we use these with the wraparound port of the juxta lite stocking. 2. We have also encouraged use of the compression pumps on top of this as well as moisturizer 3. The patient can be discharged. She looks frail to me I do not know that we are going to be able to keep this under control however we will have to wait and see whether she is going to be able to put on the inner part of these juxta lite stockings and do that consistently I am unsure Electronic Signature(s) Signed: 11/05/2020 3:37:20 PM By: Linton Ham MD Entered By: Linton Ham on 11/05/2020 12:40:19 -------------------------------------------------------------------------------- SuperBill Details Patient Name: Date of Service: Jackie [name], [place]. 11/05/2020 Medical Record Number: [id number] Patient Account Number: [id number] Date of Birth/Sex: Treating RN: 1932-02-07 (86  y.o. [name], [place] Primary Care Provider: [name] Other Clinician: Referring Provider: Treating Provider/Extender: [name] in Treatment: 1 Diagnosis Coding ICD-10 Codes Code Description [phone] Chronic venous hypertension (idiopathic) with ulcer and inflammation of left lower extremity I89.0 Lymphedema, not elsewhere classified L97.321 Non-pressure chronic ulcer of left ankle limited to breakdown of skin Facility Procedures CPT4 Code: [id number] Description: [place] VISIT-LEV 3 EST PT Modifier: Quantity: 1 Physician Procedures Electronic Signature(s) Signed: 11/05/2020 3:37:20 PM By: Linton Ham MD Previous Signature: 11/05/2020 11:28:30 AM Version By: Rhae Hammock RN Entered By: Linton Ham on 11/05/2020 12:40:42

## 2020-11-10 ENCOUNTER — Ambulatory Visit: Payer: Medicare Other | Admitting: Podiatry

## 2020-11-17 ENCOUNTER — Other Ambulatory Visit: Payer: Self-pay

## 2020-11-17 ENCOUNTER — Encounter (HOSPITAL_BASED_OUTPATIENT_CLINIC_OR_DEPARTMENT_OTHER): Payer: Medicare Other | Admitting: Internal Medicine

## 2020-11-17 DIAGNOSIS — I87332 Chronic venous hypertension (idiopathic) with ulcer and inflammation of left lower extremity: Secondary | ICD-10-CM | POA: Diagnosis not present

## 2020-11-18 NOTE — Progress Notes (Signed)
TAMIRA, RYLAND [place]. ([id number]) Visit Report for 11/17/2020 HPI Details Patient Name: Date of Service: [name] CE M. 11/17/2020 1:15 PM Medical Record Number: [id number] Patient Account Number: [id number] Date of Birth/Sex: Treating RN: Mar 14, 1932 (86 y.o. [name], Lauren Primary Care Provider: [name] Other Clinician: Referring Provider: Treating Provider/Extender: [name] in Treatment: 2 History of Present Illness HPI Description: ADMISSION 07/17/2020 This is an 86 year old woman who is referred by her nurse practitioner at [facility] who has been doing treatment for the wound on the right medial lower leg and ankle. Apparently 1 point a substantial wound however it is largely epithelialized now. Patient states its been there for about 5 or 6 weeks. On the background she has changes in the skin in this area I think the go back several years to when she had a left hip fracture. Nevertheless she does not have a wound history that I could elicit. As far as I know that they had been using TCA, Mepitel under an Unna boot they have been changing this weekly at [employer] [facility] [employer]. She also received a course of doxycycline. Past medical history very little that I can find in [place] link. She has a history of venous insufficiency bilaterally and a left hip fracture hypertension and a history of thyroidectomy for thyroid cancer. ABI in our clinic was 1.13 on the left 8/27; the patient is substantial initial wound by her description on the left medial lower leg and ankle is completely epithelialized. She has chronic venous insufficiency, dermatosclerosis and probably lymphedema. She does not have stockings and states that she would have trouble getting over the toe stockings on in any fashion because of hip problems except 9/3; this is a patient with chronic venous insufficiency with a particularly difficult area on her left  medial lower leg and ankle. My assessment of this is that she has chronic venous insufficiency and chronic venous hypertension. And at least on the left medial ankle stasis dermatitis. The area still looks angry and inflamed. Nevertheless it is epithelialized. I do not believe there is active infection. We have ordered her bilateral juxta lite stockings. These should be easier for her to put on but need to be adjusted to 30/40 compression. If this is not successful in maintaining this area I think she is going to need formal reflux studies to see if she might be a candidate for ablation of the greater saphenous vein 08/05/2020 upon evaluation today patient appears to be doing poorly in regard to her lower extremity. Unfortunately this has reopened after being healed on Friday. She tells me she was wearing her compression wrap but that even when she woke up in the morning on Saturday morning she was leaking. Fortunately there is no signs of active infection at this time systemically though there is some question of a thing locally. She does have juxta light compression bilaterally. 08/12/2020 on evaluation today patient appears to be doing well with regard to her leg ulcer which actually is closed again at this point. With that being said she is still having some issues currently with very thin skin that has come back over and again with her swelling if we do not wrap around afraid this is can open right back up. Nonetheless I do believe that she should proceed with the vascular testing next Thursday as previously ordered and recommended. Fortunately there is no signs of active infection at this time. 9/23; this is a patient I  discharged a few weeks ago. Apparently she had a reopening on the left leg medially. This is closed now. We have ordered venous reflux studies but they are not until 10/5 at [place] 10/29/2020 Patient is a now 86 year old female. She is somewhat frail  however still lives at home largely on her own. We had her in the clinic in August and September 2021 with a wound on the left medial lower extremity in the setting of chronic venous insufficiency and lymphedema. This closed reasonably easily and we discharged her to juxta lite stockings. After her discharge she had her venous reflux studies at interventional radiology. She was seen in consultation by Dr. Earleen Newport. She did not have evidence of thrombosis. She did have significant reflux of the bilateral greater saphenous vein on the right there was reflux from the saphenofemoral junction to the proximal calf where there is further decompression through network of varicosities on the left there was reflux from the saphenofemoral junction to the distal calf there is also a varicose network decompressing the calf. She was recommended I think for venous ablations of the greater saphenous vein on the left however she is wanting to defer that it into the new year. She tells me that very shortly after she left here the last time she was not able to put the juxta lite stocking on largely the inner sleeve. She developed swelling and then leakage and skin breakdown again in the left medial lower extremity. She was seen by podiatry on 3 occasions I think they were applying Unna boots. Her son says that at one point the [facility] boot was not put on properly his wife who is a nurse had to adjust this and the swelling is actually somewhat better. She arrives in clinic today with very significant swelling of the proximal two thirds of her lower extremity erythema superficial skin breakdown on the left medial ankle and calf and weeping edema fluid. Past medical history not much change from last time she has chronic venous insufficiency with very significant reflux and lymphedema. She states that she has trouble bending over to get the inner part of the juxta lite stocking on because of hip problems. Her ABI in this clinic  was not repeated but the last time she was here it was 1.13 she was not felt to have an arterial issue 12/9; the patient's wound on the left medial lower leg is again closed. She has chronic hemosiderin and stasis dermatitis left greater than right lipodermatosclerosis left greater than right. On the right leg today we are not wrapping she says she hit this on a screen door however there is no open area here. The right leg has a lot of swelling 12/21; this is a patient we discharged 10 days ago. She had bilateral juxta lite stockings and was supposed to be getting compression pumps from her son. According the patient her son tested positive for Covid and has been on quarantine and she is unable to get the internal part of the juxta lites on properly. She therefore has weeping edema left greater than right medial and is back in clinic. Electronic Signature(s) Signed: 11/17/2020 5:57:42 PM By: Linton Ham MD Entered By: Linton Ham on 11/17/2020 14:16:03 -------------------------------------------------------------------------------- Physical Exam Details Patient Name: Date of Service: [name], [place]. 11/17/2020 1:15 PM Medical Record Number: [id number] Patient Account Number: [id number] Date of Birth/Sex: Treating RN: 1932-07-27 (86 y.o. [name], Lauren Primary Care Provider: [name] Other Clinician: Referring Provider:  Treating Provider/Extender: [name] in Treatment: 2 Constitutional Patient is hypertensive.. Pulse regular and within target range for patient.Marland Kitchen Respirations regular, non-labored and within target range.. Temperature is normal and within the target range for the patient.Marland Kitchen Appears in no distress. Cardiovascular Needle pulses are palpable. Weeping edema fluid medially. Notes Wound exam; left medial ankle once again weeping clear edema fluid. She has significant stasis dermatitis but no evidence of cellulitis. Similar findings  on the right but less severe still weeping fluid. She says she is getting her socks and shoes wet and I have little trouble believing this. Electronic Signature(s) Signed: 11/17/2020 5:57:42 PM By: Linton Ham MD Entered By: Linton Ham on 11/17/2020 14:17:32 -------------------------------------------------------------------------------- Physician Orders Details Patient Name: Date of Service: [name], [place]. 11/17/2020 1:15 PM Medical Record Number: [id number] Patient Account Number: [id number] Date of Birth/Sex: Treating RN: November 30, 1931 (86 y.o. Benjaman Lobe Primary Care Provider: [name] Other Clinician: Referring Provider: Treating Provider/Extender: [name] in Treatment: 2 Verbal / Phone Orders: No Diagnosis Coding Follow-up Appointments Return Appointment in 1 week. Bathing/ Shower/ Hygiene May shower with protection but do not get wound dressing(s) wet. - May use cast protector Edema Control - Lymphedema / SCD / Other Bilateral Lower Extremities Lymphedema Pumps. Use Lymphedema pumps on leg(s) 2-3 times a day for 45-60 minutes. If wearing any wraps or hose, do not remove them. Continue exercising as instructed. Elevate legs to the level of the heart or above for 30 minutes daily and/or when sitting, a frequency of: Avoid standing for long periods of time. Other Edema Control Orders/Instructions: - Calcium Alginate to weeping areas. ABD/GAUZE as secondary. 3 layers BLE. Bring juxtalites to visit next week. Electronic Signature(s) Signed: 11/17/2020 5:57:42 PM By: Linton Ham MD Signed: 11/18/2020 5:16:00 PM By: Rhae Hammock RN Entered By: Rhae Hammock on 11/17/2020 14:09:36 -------------------------------------------------------------------------------- Problem List Details Patient Name: Date of Service: [name], [place] M. 11/17/2020 1:15 PM Medical Record Number: [id number] Patient Account Number:  [id number] Date of Birth/Sex: Treating RN: 27-Apr-1932 (86 y.o. [name], Lauren Primary Care Provider: [name] Other Clinician: Referring Provider: Treating Provider/Extender: [name] in Treatment: 2 Active Problems ICD-10 Encounter Code Description Active Date MDM Diagnosis I87.332 Chronic venous hypertension (idiopathic) with ulcer and inflammation of left 10/29/2020 No Yes lower extremity I89.0 Lymphedema, not elsewhere classified 10/29/2020 No Yes L97.321 Non-pressure chronic ulcer of left ankle limited to breakdown of skin 10/29/2020 No Yes Inactive Problems Resolved Problems Electronic Signature(s) Signed: 11/17/2020 5:57:42 PM By: Linton Ham MD Entered By: Linton Ham on 11/17/2020 14:14:37 -------------------------------------------------------------------------------- Progress Note Details Patient Name: Date of Service: [name], [place]. 11/17/2020 1:15 PM Medical Record Number: [id number] Patient Account Number: [id number] Date of Birth/Sex: Treating RN: Jun 17, 1932 (86 y.o. [name], Lauren Primary Care Provider: [name] Other Clinician: Referring Provider: Treating Provider/Extender: [name] in Treatment: 2 Subjective History of Present Illness (HPI) ADMISSION 07/17/2020 This is an 86 year old woman who is referred by her nurse practitioner at [facility] who has been doing treatment for the wound on the right medial lower leg and ankle. Apparently 1 point a substantial wound however it is largely epithelialized now. Patient states its been there for about 5 or 6 weeks. On the background she has changes in the skin in this area I think the go back several years to when she had a left hip fracture. Nevertheless she does not  have a wound history that I could elicit. As far as I know that they had been using TCA, Mepitel under an Unna boot they have been changing this  weekly at [employer] [facility] [employer]. She also received a course of doxycycline. Past medical history very little that I can find in [place] link. She has a history of venous insufficiency bilaterally and a left hip fracture hypertension and a history of thyroidectomy for thyroid cancer. ABI in our clinic was 1.13 on the left 8/27; the patient is substantial initial wound by her description on the left medial lower leg and ankle is completely epithelialized. She has chronic venous insufficiency, dermatosclerosis and probably lymphedema. She does not have stockings and states that she would have trouble getting over the toe stockings on in any fashion because of hip problems except 9/3; this is a patient with chronic venous insufficiency with a particularly difficult area on her left medial lower leg and ankle. My assessment of this is that she has chronic venous insufficiency and chronic venous hypertension. And at least on the left medial ankle stasis dermatitis. The area still looks angry and inflamed. Nevertheless it is epithelialized. I do not believe there is active infection. We have ordered her bilateral juxta lite stockings. These should be easier for her to put on but need to be adjusted to 30/40 compression. If this is not successful in maintaining this area I think she is going to need formal reflux studies to see if she might be a candidate for ablation of the greater saphenous vein 08/05/2020 upon evaluation today patient appears to be doing poorly in regard to her lower extremity. Unfortunately this has reopened after being healed on Friday. She tells me she was wearing her compression wrap but that even when she woke up in the morning on Saturday morning she was leaking. Fortunately there is no signs of active infection at this time systemically though there is some question of a thing locally. She does have juxta light compression bilaterally. 08/12/2020 on evaluation today  patient appears to be doing well with regard to her leg ulcer which actually is closed again at this point. With that being said she is still having some issues currently with very thin skin that has come back over and again with her swelling if we do not wrap around afraid this is can open right back up. Nonetheless I do believe that she should proceed with the vascular testing next Thursday as previously ordered and recommended. Fortunately there is no signs of active infection at this time. 9/23; this is a patient I discharged a few weeks ago. Apparently she had a reopening on the left leg medially. This is closed now. We have ordered venous reflux studies but they are not until 10/5 at [place] 10/29/2020 Patient is a now 86 year old female. She is somewhat frail however still lives at home largely on her own. We had her in the clinic in August and September 2021 with a wound on the left medial lower extremity in the setting of chronic venous insufficiency and lymphedema. This closed reasonably easily and we discharged her to juxta lite stockings. After her discharge she had her venous reflux studies at interventional radiology. She was seen in consultation by Dr. Earleen Newport. She did not have evidence of thrombosis. She did have significant reflux of the bilateral greater saphenous vein on the right there was reflux from the saphenofemoral junction to the proximal calf where there is further decompression through network  of varicosities on the left there was reflux from the saphenofemoral junction to the distal calf there is also a varicose network decompressing the calf. She was recommended I think for venous ablations of the greater saphenous vein on the left however she is wanting to defer that it into the new year. She tells me that very shortly after she left here the last time she was not able to put the juxta lite stocking on largely the inner sleeve. She  developed swelling and then leakage and skin breakdown again in the left medial lower extremity. She was seen by podiatry on 3 occasions I think they were applying Unna boots. Her son says that at one point the [facility] boot was not put on properly his wife who is a nurse had to adjust this and the swelling is actually somewhat better. She arrives in clinic today with very significant swelling of the proximal two thirds of her lower extremity erythema superficial skin breakdown on the left medial ankle and calf and weeping edema fluid. Past medical history not much change from last time she has chronic venous insufficiency with very significant reflux and lymphedema. She states that she has trouble bending over to get the inner part of the juxta lite stocking on because of hip problems. Her ABI in this clinic was not repeated but the last time she was here it was 1.13 she was not felt to have an arterial issue 12/9; the patient's wound on the left medial lower leg is again closed. She has chronic hemosiderin and stasis dermatitis left greater than right lipodermatosclerosis left greater than right. On the right leg today we are not wrapping she says she hit this on a screen door however there is no open area here. The right leg has a lot of swelling 12/21; this is a patient we discharged 10 days ago. She had bilateral juxta lite stockings and was supposed to be getting compression pumps from her son. According the patient her son tested positive for Covid and has been on quarantine and she is unable to get the internal part of the juxta lites on properly. She therefore has weeping edema left greater than right medial and is back in clinic. Objective Constitutional Patient is hypertensive.. Pulse regular and within target range for patient.Marland Kitchen Respirations regular, non-labored and within target range.. Temperature is normal and within the target range for the patient.Marland Kitchen Appears in no distress. Vitals Time  Taken: 1:50 PM, Height: 61 in, Weight: 126 lbs, BMI: 23.8, Temperature: 97.8 F, Pulse: 65 bpm, Respiratory Rate: 15 breaths/min, Blood Pressure: 158/76 mmHg. Cardiovascular Needle pulses are palpable. Weeping edema fluid medially. General Notes: Wound exam; left medial ankle once again weeping clear edema fluid. She has significant stasis dermatitis but no evidence of cellulitis. Similar findings on the right but less severe still weeping fluid. She says she is getting her socks and shoes wet and I have little trouble believing this. Assessment Active Problems ICD-10 Chronic venous hypertension (idiopathic) with ulcer and inflammation of left lower extremity Lymphedema, not elsewhere classified Non-pressure chronic ulcer of left ankle limited to breakdown of skin Procedures There was a Three Layer Compression Therapy Procedure by Rhae Hammock, RN. Post procedure Diagnosis Wound #: Same as Pre-Procedure Plan Follow-up Appointments: Return Appointment in 1 week. Bathing/ Shower/ Hygiene: May shower with protection but do not get wound dressing(s) wet. - May use cast protector Edema Control - Lymphedema / SCD / Other: Lymphedema Pumps. Use Lymphedema pumps on leg(s) 2-3 times  a day for 45-60 minutes. If wearing any wraps or hose, do not remove them. Continue exercising as instructed. Elevate legs to the level of the heart or above for 30 minutes daily and/or when sitting, a frequency of: Avoid standing for long periods of time. Other Edema Control Orders/Instructions: - Calcium Alginate to weeping areas. ABD/GAUZE as secondary. 3 layers BLE. Bring juxtalites to visit next week. 1. I do not know how were going to solve this problem. The patient is too frail to put on stockings properly even juxta lite stockings and I wonder about her ability to do compression pumps 2. I put calcium alginate ABDs on the weeping areas on the medial ankles and put her back in 3 layer compression but I do  not know that we can justify doing this for ever. 3. No evidence of systemic fluid volume overload Electronic Signature(s) Signed: 11/17/2020 5:57:42 PM By: Linton Ham MD Entered By: Linton Ham on 11/17/2020 14:18:32 -------------------------------------------------------------------------------- SuperBill Details Patient Name: Date of Service: [name], [place]. 11/17/2020 Medical Record Number: [id number] Patient Account Number: [id number] Date of Birth/Sex: Treating RN: 05-05-1932 (86 y.o. 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 Modifier: Quantity: 1 Physician Procedures : CPT4 Code Description Modifier [id number] - WC PHYS LEVEL 3 - EST PT ICD-10 Diagnosis Description I87.332 Chronic venous hypertension (idiopathic) with ulcer and inflammation of left lower extremity I89.0 Lymphedema, not elsewhere classified L97.321  Non-pressure chronic ulcer of left ankle limited to breakdown of skin Quantity: 1 Electronic Signature(s) Signed: 11/17/2020 5:57:42 PM By: Linton Ham MD Entered By: Linton Ham on 11/17/2020 14:18:52

## 2020-11-18 NOTE — Progress Notes (Signed)
Jackie, [name] [place]. ([id number]) Visit Report for 11/17/2020 Arrival Information Details Patient Name: Date of Service: Jackie [name] CE M. 11/17/2020 1:15 PM Medical Record Number: [id number] Patient Account Number: [id number] Date of Birth/Sex: Treating RN: 02/25/1932 (86 y.o. Jackie [name], Jackie [name] Primary Care Brezlyn Manrique: [name] Other Clinician: Referring Caron Ode: Treating Thoma Paulsen/Extender: [name] in Treatment: 2 Visit Information History Since Last Visit Added or deleted any medications: No Patient Arrived: Jackie [name] Any new allergies or adverse reactions: No Arrival Time: 13:41 Had a fall or experienced change in No Accompanied By: self activities of daily living that may affect Transfer Assistance: None risk of falls: Patient Identification Verified: Yes Signs or symptoms of abuse/neglect since last visito No Secondary Verification Process Completed: Yes Hospitalized since last visit: No Patient Requires Transmission-Based Precautions: No Implantable device outside of the clinic excluding No Patient Has Alerts: Yes cellular tissue based products placed in the center Patient Alerts: ABI: L 1.13 07/2020 since last visit: Has Dressing in Place as Prescribed: Yes Pain Present Now: No Electronic Signature(s) Signed: 11/17/2020 2:20:57 PM By: Sandre Kitty Entered By: Sandre Kitty on 11/17/2020 13:50:43 -------------------------------------------------------------------------------- Compression Therapy Details Patient Name: Date of Service: Jackie [name], [name]. 11/17/2020 1:15 PM Medical Record Number: [id number] Patient Account Number: [id number] Date of Birth/Sex: Treating RN: [birth year] (86 y.o. Jackie [name] Primary Care Brionna Romanek: [name] Other Clinician: Referring Kenadie Royce: Treating Jackie [name]/Extender: [name] in Treatment: 2 Compression Therapy Performed for Wound Assessment: NonWound  Condition Lymphedema - Bilateral Leg Performed By: Clinician Rhae Hammock, RN Compression Type: Three Layer Post Procedure Diagnosis Same as Pre-procedure Electronic Signature(s) Signed: 11/18/2020 5:16:00 PM By: Rhae Hammock RN Entered By: Rhae Hammock on 11/17/2020 14:06:58 -------------------------------------------------------------------------------- Encounter Discharge Information Details Patient Name: Date of Service: Jackie [name], [name]. 11/17/2020 1:15 PM Medical Record Number: [id number] Patient Account Number: [id number] Date of Birth/Sex: Treating RN: [birth year] (86 y.o. Jackie [name], Jackie [name] Primary Care Izac Faulkenberry: [name] Other Clinician: Referring Gary Gabrielsen: Treating Rajah Lamba/Extender: [name] in Treatment: 2 Encounter Discharge Information Items Discharge Condition: Stable Ambulatory Status: Ambulatory Discharge Destination: Home Transportation: Private Auto Accompanied By: self Schedule Follow-up Appointment: Yes Clinical Summary of Care: Patient Declined Notes educated patient at length about lymphedema pumps, dressings, compressions garments, following up with Dr. Earleen Newport at [facility]. Patient in agreement. Electronic Signature(s) Signed: 11/17/2020 5:55:39 PM By: Deon Pilling Entered By: Deon Pilling on 11/17/2020 17:06:28 -------------------------------------------------------------------------------- Lower Extremity Assessment Details Patient Name: Date of Service: Jackie [name], [name]. 11/17/2020 1:15 PM Medical Record Number: [id number] Patient Account Number: [id number] Date of Birth/Sex: Treating RN: July 06, 1932 (86 y.o. Jackie [name] Primary Care Marciel Offenberger: [name] Other Clinician: Referring Glenda Spelman: Treating Jackie [name]/Extender: [name] in Treatment: 2 Edema Assessment Assessed: Jackie [name]: Yes] Jackie [name]: Yes] Edema: [Left: Yes] [Right: Yes] Calf Left:  Right: Point of Measurement: 38 cm From Medial Instep 36.5 cm 34.5 cm Ankle Left: Right: Point of Measurement: 9 cm From Medial Instep 23 cm 22.5 cm Knee To Floor Left: Right: From Medial Instep 40.5 cm 40.5 cm Vascular Assessment Pulses: Dorsalis Pedis Palpable: [Left:Yes] [Right:Yes] Electronic Signature(s) Signed: 11/17/2020 5:55:39 PM By: Deon Pilling Entered By: Deon Pilling on 11/17/2020 13:59:08 -------------------------------------------------------------------------------- Multi Wound Chart Details Patient Name: Date of Service: Jackie [name], [name]. 11/17/2020 1:15 PM Medical Record Number: [id number] Patient Account Number: [id number] Date of Birth/Sex: Treating RN: 10-28-1932 (86 y.o. Jackie [name] Primary Care Daleen Steinhaus: [place],  Annie Main Other Clinician: Referring Monserrate Blaschke: Treating Shawnique Mariotti/Extender: [name] in Treatment: 2 Vital Signs Height(in): 61 Pulse(bpm): 65 Weight(lbs): 126 Blood Pressure(mmHg): 158/76 Body Mass Index(BMI): 24 Temperature(F): 97.8 Respiratory Rate(breaths/min): 15 Wound Assessments Treatment Notes Electronic Signature(s) Signed: 11/17/2020 5:57:42 PM By: Linton Ham MD Signed: 11/18/2020 5:16:00 PM By: Rhae Hammock RN Entered By: Linton Ham on 11/17/2020 14:14:42 -------------------------------------------------------------------------------- Multi-Disciplinary Care Plan Details Patient Name: Date of Service: Jackie [name], [name]. 11/17/2020 1:15 PM Medical Record Number: [id number] Patient Account Number: [id number] Date of Birth/Sex: Treating RN: 10/21/1932 (86 y.o. Jackie [name], Jackie [name] Primary Care Jony Ladnier: [name] Other Clinician: Referring Jaxin [name]: Treating Avantika Shere/Extender: [name] in Treatment: 2 Active Inactive Wound/Skin Impairment Nursing Diagnoses: Knowledge deficit related to ulceration/compromised skin  integrity Goals: Patient/caregiver will verbalize understanding of skin care regimen Date Initiated: 10/29/2020 Target Resolution Date: 12/04/2020 Goal Status: Active Interventions: Assess patient/caregiver ability to obtain necessary supplies Assess patient/caregiver ability to perform ulcer/skin care regimen upon admission and as needed Provide education on ulcer and skin care Treatment Activities: Skin care regimen initiated : 10/29/2020 Topical wound management initiated : 10/29/2020 Notes: Electronic Signature(s) Signed: 11/18/2020 5:16:00 PM By: Rhae Hammock RN Entered By: Rhae Hammock on 11/17/2020 13:52:17 -------------------------------------------------------------------------------- Non-Wound Condition Assessment Details Patient Name: Date of Service: Jackie [name], [place] M. 11/17/2020 1:15 PM Medical Record Number: [id number] Patient Account Number: [id number] Date of Birth/Sex: Treating RN: 04-02-1932 (86 y.o. Jackie [name] Primary Care Taye Cato: [name] Other Clinician: Referring Janette Harvie: Treating Hulet Ehrmann/Extender: [name] in Treatment: 2 Non-Wound Condition: Condition: Lymphedema Location: Leg Side: Bilateral Notes BLE edematous, red, weeping around ankle area. Electronic Signature(s) Signed: 11/17/2020 5:55:39 PM By: Deon Pilling Entered By: Deon Pilling on 11/17/2020 13:59:48 -------------------------------------------------------------------------------- Pain Assessment Details Patient Name: Date of Service: Jackie [name], [place]. 11/17/2020 1:15 PM Medical Record Number: [id number] Patient Account Number: [id number] Date of Birth/Sex: Treating RN: 1932-03-06 (86 y.o. Jackie [name], Jackie [name] Primary Care Melia Hopes: [name] Other Clinician: Referring Daxtin Leiker: Treating Shakaya Bhullar/Extender: [name] in Treatment: 2 Active Problems Location of Pain Severity and Description of  Pain Patient Has Paino No Site Locations Pain Management and Medication Current Pain Management: Electronic Signature(s) Signed: 11/17/2020 2:20:57 PM By: Sandre Kitty Signed: 11/18/2020 5:16:00 PM By: Rhae Hammock RN Entered By: Sandre Kitty on 11/17/2020 13:51:12 -------------------------------------------------------------------------------- Patient/Caregiver Education Details Patient Name: Date of Service: Jackie [name], [name] 12/21/2021andnbsp1:15 PM Medical Record Number: [id number] Patient Account Number: [id number] Date of Birth/Gender: Treating RN: July 10, 1932 (86 y.o. Jackie [name], Jackie [name] Primary Care Physician: [name] Other Clinician: Referring Physician: Treating Physician/Extender: [name] in Treatment: 2 Education Assessment Education Provided To: Patient Education Topics Provided Basic Hygiene: Methods: Explain/Verbal Responses: State content correctly [place]: o Methods: Explain/Verbal Responses: State content correctly Wound/Skin Impairment: Methods: Explain/Verbal Responses: State content correctly Electronic Signature(s) Signed: 11/18/2020 5:16:00 PM By: Rhae Hammock RN Entered By: Rhae Hammock on 11/17/2020 13:52:37 -------------------------------------------------------------------------------- Vitals Details Patient Name: Date of Service: Jackie [name], [place]. 11/17/2020 1:15 PM Medical Record Number: [id number] Patient Account Number: [id number] Date of Birth/Sex: Treating RN: 1932/10/27 (86 y.o. Jackie [name], Jackie [name] Primary Care Amario Longmore: [name] Other Clinician: Referring Zabian Swayne: Treating Shmuel Girgis/Extender: [name] in Treatment: 2 Vital Signs Time Taken: 13:50 Temperature (F): 97.8 Height (in): 61 Pulse (bpm): 65 Weight (lbs): 126 Respiratory Rate (breaths/min): 15 Body Mass Index (BMI): 23.8 Blood Pressure (  mmHg):  158/76 Reference Range: 80 - 120 mg / dl Electronic Signature(s) Signed: 11/17/2020 2:20:57 PM By: Sandre Kitty Entered By: Sandre Kitty on 11/17/2020 13:51:04

## 2020-11-24 ENCOUNTER — Encounter (HOSPITAL_BASED_OUTPATIENT_CLINIC_OR_DEPARTMENT_OTHER): Payer: Medicare Other | Admitting: Internal Medicine

## 2020-11-24 ENCOUNTER — Other Ambulatory Visit: Payer: Self-pay

## 2020-11-24 DIAGNOSIS — I87332 Chronic venous hypertension (idiopathic) with ulcer and inflammation of left lower extremity: Secondary | ICD-10-CM | POA: Diagnosis not present

## 2020-11-25 NOTE — Progress Notes (Signed)
Jackie, [name] [place]. ([id number]) Visit Report for 11/24/2020 Arrival Information Details Patient Name: Date of Service: Jackie [name] CE M. 11/24/2020 12:45 PM Medical Record Number: [id number] Patient Account Number: [id number] Date of Birth/Sex: Treating RN: 1932/01/10 (86 y.o. [name] Primary Care Torii Royse: [name] Other Clinician: Referring Annamarie Yamaguchi: Treating Zaylen Susman/Extender: [name] in Treatment: 3 Visit Information History Since Last Visit Added or deleted any medications: No Patient Arrived: [name] Any new allergies or adverse reactions: No Arrival Time: 12:52 Had a fall or experienced change in No Accompanied By: self activities of daily living that may affect Transfer Assistance: None risk of falls: Patient Identification Verified: Yes Signs or symptoms of abuse/neglect since last visito No Secondary Verification Process Completed: Yes Hospitalized since last visit: No Patient Requires Transmission-Based Precautions: No Implantable device outside of the clinic excluding No Patient Has Alerts: Yes cellular tissue based products placed in the center Patient Alerts: ABI: L 1.13 07/2020 since last visit: Has Dressing in Place as Prescribed: Yes Pain Present Now: No Electronic Signature(s) Signed: 11/25/2020 10:14:31 AM By: Sandre Kitty Entered By: Sandre Kitty on 11/24/2020 12:52:23 -------------------------------------------------------------------------------- Compression Therapy Details Patient Name: Date of Service: Jackie [name], [name]. 11/24/2020 12:45 PM Medical Record Number: [id number] Patient Account Number: [id number] Date of Birth/Sex: Treating RN: 1931-12-27 (86 y.o. [name] Primary Care Lakynn Halvorsen: [name] Other Clinician: Referring Koleen Celia: Treating Canda Podgorski/Extender: [name] in Treatment: 3 Compression Therapy Performed for Wound Assessment: Non-Wound  Location Performed By: Clinician Deon Pilling, RN Compression Type: Three Layer Location: Lower Extremity, Left Post Procedure Diagnosis Same as Pre-procedure Electronic Signature(s) Signed: 11/24/2020 6:35:32 PM By: Baruch Gouty RN, BSN Entered By: Baruch Gouty on 11/24/2020 13:28:59 -------------------------------------------------------------------------------- Encounter Discharge Information Details Patient Name: Date of Service: Jackie [name], [name]. 11/24/2020 12:45 PM Medical Record Number: [id number] Patient Account Number: [id number] Date of Birth/Sex: Treating RN: 04-Jul-1932 (86 y.o. [name] Primary Care Kodie Pick: [name] Other Clinician: Referring Nahjae Hoeg: Treating Auren Valdes/Extender: [name] in Treatment: 3 Encounter Discharge Information Items Discharge Condition: Stable Ambulatory Status: [place] Discharge Destination: Home Transportation: Private Auto Accompanied By: self Schedule Follow-up Appointment: Yes Clinical Summary of Care: Patient Declined Electronic Signature(s) Signed: 11/24/2020 4:33:01 PM By: Mikeal Hawthorne EMT/HBOT/SD Entered By: Mikeal Hawthorne on 11/24/2020 14:09:10 -------------------------------------------------------------------------------- Lower Extremity Assessment Details Patient Name: Date of Service: Jackie [name], [name]. 11/24/2020 12:45 PM Medical Record Number: [id number] Patient Account Number: [id number] Date of Birth/Sex: Treating RN: October 06, 1932 (86 y.o. [name] Primary Care Heather Mckendree: [name] Other Clinician: Referring Emiah Pellicano: Treating Janelle Culton/Extender: [name] in Treatment: 3 Edema Assessment Assessed: [name]: No] [Right: No] Edema: [Left: Yes] [Right: Yes] Calf Left: Right: Point of Measurement: 38 cm From Medial Instep 33.5 cm 33.4 cm Ankle Left: Right: Point of Measurement: 9 cm From Medial Instep 21.5 cm 21.5  cm Vascular Assessment Pulses: Dorsalis Pedis Palpable: [Left:Yes] [Right:Yes] Electronic Signature(s) Signed: 11/24/2020 6:35:32 PM By: Baruch Gouty RN, BSN Entered By: Baruch Gouty on 11/24/2020 13:20:52 -------------------------------------------------------------------------------- Multi-Disciplinary Care Plan Details Patient Name: Date of Service: Jackie [name], [place]. 11/24/2020 12:45 PM Medical Record Number: [id number] Patient Account Number: [id number] Date of Birth/Sex: Treating RN: 1932-11-02 (86 y.o. [name] Primary Care Latavia Goga: [name] Other Clinician: Referring Lilibeth Opie: Treating Kourosh Jablonsky/Extender: [name] in Treatment: 3 Active Inactive Wound/Skin Impairment Nursing Diagnoses: Knowledge deficit related to ulceration/compromised skin integrity Goals: Patient/caregiver  will verbalize understanding of skin care regimen Date Initiated: 10/29/2020 Target Resolution Date: 12/04/2020 Goal Status: Active Interventions: Assess patient/caregiver ability to obtain necessary supplies Assess patient/caregiver ability to perform ulcer/skin care regimen upon admission and as needed Provide education on ulcer and skin care Treatment Activities: Skin care regimen initiated : 10/29/2020 Topical wound management initiated : 10/29/2020 Notes: Electronic Signature(s) Signed: 11/24/2020 6:35:32 PM By: Zenaida Deed RN, BSN Entered By: Zenaida Deed on 11/24/2020 13:21:40 -------------------------------------------------------------------------------- Pain Assessment Details Patient Name: Date of Service: Jackie [name], [name] 11/24/2020 12:45 PM Medical Record Number: [id number] Patient Account Number: [id number] Date of Birth/Sex: Treating RN: 07/15/1932 (86 y.o. Tommye Standard Primary Care Denitra Donaghey: Adrian Prince Other Clinician: Referring Gilmer Kaminsky: Treating Jayli Fogleman/Extender: Lennie Odor  in Treatment: 3 Active Problems Location of Pain Severity and Description of Pain Patient Has Paino No Site Locations Pain Management and Medication Current Pain Management: Electronic Signature(s) Signed: 11/24/2020 6:35:32 PM By: Zenaida Deed RN, BSN Signed: 11/25/2020 10:14:31 AM By: Karl Ito Entered By: Karl Ito on 11/24/2020 12:55:28 -------------------------------------------------------------------------------- Patient/Caregiver Education Details Patient Name: Date of Service: Laroy Apple CE M. 12/28/2021andnbsp12:45 PM Medical Record Number: [id number] Patient Account Number: [id number] Date of Birth/Gender: Treating RN: 1932/08/14 (86 y.o. Tommye Standard Primary Care Physician: Adrian Prince Other Clinician: Referring Physician: Treating Physician/Extender: Lennie Odor in Treatment: 3 Education Assessment Education Provided To: Patient Education Topics Provided Venous: Methods: Explain/Verbal Responses: Reinforcements needed, State content correctly Wound/Skin Impairment: Methods: Explain/Verbal Responses: Reinforcements needed, State content correctly Electronic Signature(s) Signed: 11/24/2020 6:35:32 PM By: Zenaida Deed RN, BSN Entered By: Zenaida Deed on 11/24/2020 13:22:02 -------------------------------------------------------------------------------- Vitals Details Patient Name: Date of Service: Jackie [name], [name] 11/24/2020 12:45 PM Medical Record Number: [id number] Patient Account Number: [id number] Date of Birth/Sex: Treating RN: [birth year] (86 y.o. Tommye Standard Primary Care Famous Eisenhardt: Adrian Prince Other Clinician: Referring Kamorie Aldous: Treating Armando Lauman/Extender: Lennie Odor in Treatment: 3 Vital Signs Time Taken: 12:52 Temperature (F): 97.9 Height (in): 61 Pulse (bpm): 63 Weight (lbs): 126 Respiratory Rate (breaths/min): 16 Body Mass Index (BMI):  23.8 Blood Pressure (mmHg): 159/60 Reference Range: 80 - 120 mg / dl Electronic Signature(s) Signed: 11/25/2020 10:14:31 AM By: Karl Ito Entered By: Karl Ito on 11/24/2020 12:55:23

## 2020-11-25 NOTE — Progress Notes (Signed)
Jackie [name], Jackie [name] [place]. ([id number]) Visit Report for 11/24/2020 HPI Details Patient Name: Date of Service: Jackie [name] CE M. 11/24/2020 12:45 PM Medical Record Number: [id number] Patient Account Number: [id number] Date of Birth/Sex: Treating RN: Aug 13, 1932 (86 y.o. Tommye Standard Primary Care Provider: Adrian Prince Other Clinician: Referring Provider: Treating Provider/Extender: Lennie Odor in Treatment: 3 History of Present Illness HPI Description: ADMISSION 07/17/2020 This is an 86 year old woman who is referred by her nurse practitioner at [facility] who has been doing treatment for the wound on the right medial lower leg and ankle. Apparently 1 point a substantial wound however it is largely epithelialized now. Patient states its been there for about 5 or 6 weeks. On the background she has changes in the skin in this area I think the go back several years to when she had a left hip fracture. Nevertheless she does not have a wound history that I could elicit. As far as I know that they had been using TCA, Mepitel under an Unna boot they have been changing this weekly at [facility]. She also received a course of doxycycline. Past medical history very little that I can find in [place] link. She has a history of venous insufficiency bilaterally and a left hip fracture hypertension and a history of thyroidectomy for thyroid cancer. ABI in our clinic was 1.13 on the left 8/27; the patient is substantial initial wound by her description on the left medial lower leg and ankle is completely epithelialized. She has chronic venous insufficiency, dermatosclerosis and probably lymphedema. She does not have stockings and states that she would have trouble getting over the toe stockings on in any fashion because of hip problems except 9/3; this is a patient with chronic venous insufficiency with a particularly difficult area on her left  medial lower leg and ankle. My assessment of this is that she has chronic venous insufficiency and chronic venous hypertension. And at least on the left medial ankle stasis dermatitis. The area still looks angry and inflamed. Nevertheless it is epithelialized. I do not believe there is active infection. We have ordered her bilateral juxta lite stockings. These should be easier for her to put on but need to be adjusted to 30/40 compression. If this is not successful in maintaining this area I think she is going to need formal reflux studies to see if she might be a candidate for ablation of the greater saphenous vein 08/05/2020 upon evaluation today patient appears to be doing poorly in regard to her lower extremity. Unfortunately this has reopened after being healed on Friday. She tells me she was wearing her compression wrap but that even when she woke up in the morning on Saturday morning she was leaking. Fortunately there is no signs of active infection at this time systemically though there is some question of a thing locally. She does have juxta light compression bilaterally. 08/12/2020 on evaluation today patient appears to be doing well with regard to her leg ulcer which actually is closed again at this point. With that being said she is still having some issues currently with very thin skin that has come back over and again with her swelling if we do not wrap around afraid this is can open right back up. Nonetheless I do believe that she should proceed with the vascular testing next Thursday as previously ordered and recommended. Fortunately there is no signs of active infection at this time. 9/23; this is a patient I  discharged a few weeks ago. Apparently she had a reopening on the left leg medially. This is closed now. We have ordered venous reflux studies but they are not until 10/5 at [place] 10/29/2020 Patient is a now 86 year old female. She is somewhat frail  however still lives at home largely on her own. We had her in the clinic in August and September 2021 with a wound on the left medial lower extremity in the setting of chronic venous insufficiency and lymphedema. This closed reasonably easily and we discharged her to juxta lite stockings. After her discharge she had her venous reflux studies at interventional radiology. She was seen in consultation by Dr. Earleen Newport. She did not have evidence of thrombosis. She did have significant reflux of the bilateral greater saphenous vein on the right there was reflux from the saphenofemoral junction to the proximal calf where there is further decompression through network of varicosities on the left there was reflux from the saphenofemoral junction to the distal calf there is also a varicose network decompressing the calf. She was recommended I think for venous ablations of the greater saphenous vein on the left however she is wanting to defer that it into the new year. She tells me that very shortly after she left here the last time she was not able to put the juxta lite stocking on largely the inner sleeve. She developed swelling and then leakage and skin breakdown again in the left medial lower extremity. She was seen by podiatry on 3 occasions I think they were applying Unna boots. Her son says that at one point the [facility] boot was not put on properly his wife who is a nurse had to adjust this and the swelling is actually somewhat better. She arrives in clinic today with very significant swelling of the proximal two thirds of her lower extremity erythema superficial skin breakdown on the left medial ankle and calf and weeping edema fluid. Past medical history not much change from last time she has chronic venous insufficiency with very significant reflux and lymphedema. She states that she has trouble bending over to get the inner part of the juxta lite stocking on because of hip problems. Her ABI in this clinic  was not repeated but the last time she was here it was 1.13 she was not felt to have an arterial issue 12/9; the patient's wound on the left medial lower leg is again closed. She has chronic hemosiderin and stasis dermatitis left greater than right lipodermatosclerosis left greater than right. On the right leg today we are not wrapping she says she hit this on a screen door however there is no open area here. The right leg has a lot of swelling 12/21; this is a patient we discharged 10 days ago. She had bilateral juxta lite stockings and was supposed to be getting compression pumps from her son. According the patient her son tested positive for Covid and has been on quarantine and she is unable to get the internal part of the juxta lites on properly. She therefore has weeping edema left greater than right medial and is back in clinic. 12/28. Patient comes in with no open areas on the right leg. She still has very inflamed tenderness on the left medial ankle and lower calf. This could be cellulitis or stasis dermatitis. Also similar changes on the left lateral Electronic Signature(s) Signed: 11/25/2020 12:07:59 PM By: Linton Ham MD Entered By: Linton Ham on 11/24/2020 13:31:36 -------------------------------------------------------------------------------- Physical Exam Details Patient Name:  Date of Service: Jackie [name] CE M. 11/24/2020 12:45 PM Medical Record Number: [id number] Patient Account Number: [id number] Date of Birth/Sex: Treating RN: 01/26/1932 (86 y.o. Tommye Standard Primary Care Provider: Adrian Prince Other Clinician: Referring Provider: Treating Provider/Extender: Lennie Odor in Treatment: 3 Constitutional Patient is hypertensive.. Pulse regular and within target range for patient.Marland Kitchen Respirations regular, non-labored and within target range.. Temperature is normal and within the target range for the patient.Marland Kitchen Appears in no  distress. Cardiovascular Pedal pulses are palpable. We appear to have decent edema control. Integumentary (Hair, Skin) Significant erythema on the left medial lower calf and ankle. Area is warm and tender. Notes Wound exam; left medial ankle under illumination this is mostly epithelialized but very fragile and weeping in spots. There is a lot of tenderness around this area. Lesser degrees of change on the left lateral. I am not used to this degree of tenderness from stasis dermatitis There is nothing open on the right and no weeping fluid Electronic Signature(s) Signed: 11/25/2020 12:07:59 PM By: Baltazar Najjar MD Entered By: Baltazar Najjar on 11/24/2020 13:34:20 -------------------------------------------------------------------------------- Physician Orders Details Patient Name: Date of Service: [name], [name] 11/24/2020 12:45 PM Medical Record Number: [id number] Patient Account Number: [id number] Date of Birth/Sex: Treating RN: [birth year] (86 y.o. 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 Bathing/ Shower/ Hygiene May shower with protection but do not get wound dressing(s) wet. - May use cast protector Edema Control - Lymphedema / SCD / Other Bilateral Lower Extremities Lymphedema Pumps. Use Lymphedema pumps on leg(s) 2-3 times a day for 45-60 minutes. If wearing any wraps or hose, do not remove them. Continue exercising as instructed. - once available Elevate legs to the level of the heart or  above for 30 minutes daily and/or when sitting, a frequency of: Avoid standing for long periods of time. Compression stocking or Garment 20-30 mm/Hg pressure to: - juxtalite compression garment to right leg daily, apply in the morning and remove at bedtime Non Wound Condition Left Lower Extremity pply the following to affected area as directed: - TCA cream mixed with lotion to left leg, silver alginate to any weeping areas then apply 3 layer A compression wrap Patient Medications llergies: codeine, Zithromax A Notifications Medication Indication Start End 11/24/2020 doxycycline monohydrate DOSE oral 100 mg capsule - 1 capsule oral bid for 7 days Electronic Signature(s) Signed: 11/24/2020 1:37:51 PM By: Baltazar Najjar MD Entered By: Baltazar Najjar on 11/24/2020 13:37:50 -------------------------------------------------------------------------------- Problem List Details Patient Name: Date of Service: [name], [name] 11/24/2020 12:45 PM Medical Record Number: [id number] Patient Account Number: [id number] Date of Birth/Sex: Treating RN: [birth year] (86 y.o. 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 11/24/2020 12:45 PM Medical Record Number: [id number] Patient Account Number: [id number] Date of Birth/Sex: Treating RN: 1932/03/10 (86 y.o. Tommye Standard Primary Care Provider: Adrian Prince Other Clinician: Referring Provider: Treating Provider/Extender: Lennie Odor in Treatment: 3 Subjective History of Present Illness (HPI) ADMISSION 07/17/2020 This is an 86 year old woman who is referred by her nurse practitioner at [facility] who has been doing treatment for the wound on the right medial lower leg and ankle. Apparently 1 point a substantial wound however it is largely epithelialized now. Patient states its been there for about 5 or 6 weeks. On the background she has changes in the skin in this area I think the go back several years to when she had a left hip fracture. Nevertheless she does not have a wound history that I could elicit. As far as I know that they had been using TCA, Mepitel under an Unna boot they have been changing this weekly at [facility]. She also received a course of doxycycline. Past medical history very little that I can find in [place] link. She has a history of venous insufficiency bilaterally and a left hip fracture hypertension and a history of thyroidectomy for thyroid cancer. ABI in our clinic was 1.13 on the left 8/27; the patient is substantial initial wound by her description on the left medial lower leg and ankle is completely epithelialized. She has chronic venous insufficiency, dermatosclerosis and probably lymphedema. She does not have stockings and states that she would have trouble getting over the toe stockings on in any fashion because of hip problems except 9/3; this is a patient with chronic venous insufficiency with a particularly difficult area on her left medial lower leg and ankle. My assessment of this is that she has chronic venous insufficiency  and chronic venous hypertension. And at least on the left medial ankle stasis dermatitis. The area still looks angry and inflamed. Nevertheless it is epithelialized. I do not believe there is active infection. We have ordered her bilateral juxta lite stockings. These should be easier for her to put on but need to be adjusted to 30/40 compression. If this is not successful in maintaining this area I think she is going to need formal reflux studies to see if she might be a candidate for ablation of the greater saphenous vein 08/05/2020 upon evaluation today patient appears to be doing poorly in regard to her lower extremity. Unfortunately this has reopened after being healed on Friday. She tells me she was wearing her compression wrap but that even when she woke up in the morning on Saturday morning she was leaking. Fortunately there is no signs of active infection at this time systemically though there is some question of a thing locally. She does have juxta light compression bilaterally. 08/12/2020 on evaluation today patient appears to be doing well with regard to her leg ulcer which actually is closed again at this point. With that being said she is still having some issues currently with very thin skin that has come back over and again with her swelling if we do not wrap around afraid this is can open right back up. Nonetheless I do believe that she should proceed with the vascular testing next Thursday as previously ordered and recommended. Fortunately there is no signs of active infection  at this time. 9/23; this is a patient I discharged a few weeks ago. Apparently she had a reopening on the left leg medially. This is closed now. We have ordered venous reflux studies but they are not until 10/5 at [facility] READMISSION 10/29/2020 Patient is a now 86 year old female. She is somewhat frail however still lives at home largely on her own. We had her in the clinic in August and September 2021  with a wound on the left medial lower extremity in the setting of chronic venous insufficiency and lymphedema. This closed reasonably easily and we discharged her to juxta lite stockings. After her discharge she had her venous reflux studies at interventional radiology. She was seen in consultation by Dr. Loreta Ave. She did not have evidence of thrombosis. She did have significant reflux of the bilateral greater saphenous vein on the right there was reflux from the saphenofemoral junction to the proximal calf where there is further decompression through network of varicosities on the left there was reflux from the saphenofemoral junction to the distal calf there is also a varicose network decompressing the calf. She was recommended I think for venous ablations of the greater saphenous vein on the left however she is wanting to defer that it into the new year. She tells me that very shortly after she left here the last time she was not able to put the juxta lite stocking on largely the inner sleeve. She developed swelling and then leakage and skin breakdown again in the left medial lower extremity. She was seen by podiatry on 3 occasions I think they were applying Unna boots. Her son says that at one point the [facility] boot was not put on properly his wife who is a nurse had to adjust this and the swelling is actually somewhat better. She arrives in clinic today with very significant swelling of the proximal two thirds of her lower extremity erythema superficial skin breakdown on the left medial ankle and calf and weeping edema fluid. Past medical history not much change from last time she has chronic venous insufficiency with very significant reflux and lymphedema. She states that she has trouble bending over to get the inner part of the juxta lite stocking on because of hip problems. Her ABI in this clinic was not repeated but the last time she was here it was 1.13 she was not felt to have an arterial  issue 12/9; the patient's wound on the left medial lower leg is again closed. She has chronic hemosiderin and stasis dermatitis left greater than right lipodermatosclerosis left greater than right. On the right leg today we are not wrapping she says she hit this on a screen door however there is no open area here. The right leg has a lot of swelling 12/21; this is a patient we discharged 10 days ago. She had bilateral juxta lite stockings and was supposed to be getting compression pumps from her son. According the patient her son tested positive for Covid and has been on quarantine and she is unable to get the internal part of the juxta lites on properly. She therefore has weeping edema left greater than right medial and is back in clinic. 12/28. Patient comes in with no open areas on the right leg. She still has very inflamed tenderness on the left medial ankle and lower calf. This could be cellulitis or stasis dermatitis. Also similar changes on the left lateral Objective Constitutional Patient is hypertensive.. Pulse regular and within target range for patient.Marland Kitchen Respirations  regular, non-labored and within target range.. Temperature is normal and within the target range for the patient.Marland Kitchen. Appears in no distress. Vitals Time Taken: 12:52 PM, Height: 61 in, Weight: 126 lbs, BMI: 23.8, Temperature: 97.9 F, Pulse: 63 bpm, Respiratory Rate: 16 breaths/min, Blood Pressure: 159/60 mmHg. Cardiovascular Pedal pulses are palpable. We appear to have decent edema control. General Notes: Wound exam; left medial ankle under illumination this is mostly epithelialized but very fragile and weeping in spots. There is a lot of tenderness around this area. Lesser degrees of change on the left lateral. I am not used to this degree of tenderness from stasis dermatitis ooThere is nothing open on the right and no weeping fluid Integumentary (Hair, Skin) Significant erythema on the left medial lower calf and ankle.  Area is warm and tender. Assessment Active Problems ICD-10 Chronic venous hypertension (idiopathic) with ulcer and inflammation of left lower extremity Lymphedema, not elsewhere classified Non-pressure chronic ulcer of left ankle limited to breakdown of skin Procedures There was a Three Layer Compression Therapy Procedure by Shawn Stalleaton, Bobbi, RN. Post procedure Diagnosis Wound #: Same as Pre-Procedure Plan Follow-up Appointments: Return Appointment in 1 week. Bathing/ Shower/ Hygiene: May shower with protection but do not get wound dressing(s) wet. - May use cast protector Edema Control - Lymphedema / SCD / Other: Lymphedema Pumps. Use Lymphedema pumps on leg(s) 2-3 times a day for 45-60 minutes. If wearing any wraps or hose, do not remove them. Continue exercising as instructed. - once available Elevate legs to the level of the heart or above for 30 minutes daily and/or when sitting, a frequency of: Avoid standing for long periods of time. Compression stocking or Garment 20-30 mm/Hg pressure to: - juxtalite compression garment to right leg daily, apply in the morning and remove at bedtime Non Wound Condition: Apply the following to affected area as directed: - TCA cream mixed with lotion to left leg, silver alginate to any weeping areas then apply 3 layer compression wrap The following medication(s) was prescribed: doxycycline monohydrate oral 100 mg capsule 1 capsule oral bid for 7 days starting 11/24/2020 1. Liberal TCA. Still silver alginate to the areas on the ankle 2. I am going to put her through doxycycline 100 twice daily for 7 days this is out of the possibility that some of this represents cellulitis. I am not used to seeing purely stasis dermatitis is tender as what she is on the medial calf and ankle on the left 3. I am not going to wrap the right leg and see if she can get the juxta lite stocking on. She is wearing simple cotton socks underneath this she could not get the  stocking that came with the juxta light on over her heel Electronic Signature(s) Signed: 11/24/2020 1:38:55 PM By: Baltazar Najjarobson, Donathan Buller MD Signed: 11/24/2020 1:38:55 PM By: Baltazar Najjarobson, Winthrop Shannahan MD Entered By: Baltazar Najjarobson, Kana Reimann on 11/24/2020 13:38:55 -------------------------------------------------------------------------------- SuperBill Details Patient Name: Date of Service: Duanne GuessJO HNSO N, [name] 11/24/2020 Medical Record Number: [id number] Patient Account Number: [id number] Date of Birth/Sex: Treating RN: 01/03/1932 (86 y.o. 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 Facility Procedures CPT4 Code: [id number] Description: (Facility Use Only) [phone] - APPLY MULTLAY COMPRS LWR LT LEG  Modifier: Quantity: 1 Physician Procedures : CPT4 Code Description Modifier [id number] - WC PHYS LEVEL 3 - EST PT ICD-10 Diagnosis Description I87.332 Chronic venous hypertension (idiopathic) with ulcer and inflammation of left lower extremity I89.0 Lymphedema, not elsewhere classified L97.321  Non-pressure chronic ulcer of left ankle limited to breakdown of skin Quantity: 1 Electronic Signature(s) Signed: 11/25/2020 12:07:59 PM By: Baltazar Najjar MD Entered By: Baltazar Najjar on 11/24/2020 13:36:36

## 2020-12-02 ENCOUNTER — Encounter (HOSPITAL_BASED_OUTPATIENT_CLINIC_OR_DEPARTMENT_OTHER): Payer: Medicare Other | Attending: Physician Assistant | Admitting: Physician Assistant

## 2020-12-03 ENCOUNTER — Encounter (HOSPITAL_BASED_OUTPATIENT_CLINIC_OR_DEPARTMENT_OTHER): Payer: Medicare Other | Attending: Internal Medicine | Admitting: Internal Medicine

## 2020-12-03 ENCOUNTER — Other Ambulatory Visit: Payer: Self-pay

## 2020-12-03 DIAGNOSIS — I87332 Chronic venous hypertension (idiopathic) with ulcer and inflammation of left lower extremity: Secondary | ICD-10-CM | POA: Diagnosis present

## 2020-12-03 DIAGNOSIS — I10 Essential (primary) hypertension: Secondary | ICD-10-CM | POA: Insufficient documentation

## 2020-12-03 DIAGNOSIS — L97321 Non-pressure chronic ulcer of left ankle limited to breakdown of skin: Secondary | ICD-10-CM | POA: Diagnosis not present

## 2020-12-03 DIAGNOSIS — Z85850 Personal history of malignant neoplasm of thyroid: Secondary | ICD-10-CM | POA: Insufficient documentation

## 2020-12-03 DIAGNOSIS — I890 Lymphedema, not elsewhere classified: Secondary | ICD-10-CM | POA: Insufficient documentation

## 2020-12-04 NOTE — Progress Notes (Signed)
Jackie [name], Jackie [name] Jackie [name]. ([id number]) Visit Report for 12/03/2020 HPI Details Patient Name: Date of Service: Jackie [name] CE [name]. 12/03/2020 1:00 PM Medical Record Number: [id number] Patient Account Number: [id number] Date of Birth/Sex: Treating RN: Jackie [name] (87 y.o. Jackie [name] Primary Care Provider: Reynold [name] Other Clinician: Referring Provider: Treating Provider/Extender: Jackie [name] in Treatment: 5 History of Present Illness HPI Description: ADMISSION 07/17/2020 This is an 87 year old woman who is referred by her nurse practitioner at [facility] who has been doing treatment for the wound on the right medial lower leg and ankle. Apparently 1 point a substantial wound however it is largely epithelialized now. Patient states its been there for about 5 or 6 weeks. On the background she has changes in the skin in this area I think the go back several years to when she had a left hip fracture. Nevertheless she does not have a wound history that I could elicit. As far as I know that they had been using TCA, Mepitel under an Unna boot they have been changing this weekly at [employer]. She also received a course of doxycycline. Past medical history very little that I can find in [place] link. She has a history of venous insufficiency bilaterally and a left hip fracture hypertension and a history of thyroidectomy for thyroid cancer. ABI in our clinic was 1.13 on the left 8/27; the patient is substantial initial wound by her description on the left medial lower leg and ankle is completely epithelialized. She has chronic venous insufficiency, dermatosclerosis and probably lymphedema. She does not have stockings and states that she would have trouble getting over the toe stockings on in any fashion because of hip problems except 9/3; this is a patient with chronic venous insufficiency with a particularly difficult area on her left medial  lower leg and ankle. My assessment of this is that she has chronic venous insufficiency and chronic venous hypertension. And at least on the left medial ankle stasis dermatitis. The area still looks angry and inflamed. Nevertheless it is epithelialized. I do not believe there is active infection. We have ordered her bilateral juxta lite stockings. These should be easier for her to put on but need to be adjusted to 30/40 compression. If this is not successful in maintaining this area I think she is going to need formal reflux studies to see if she might be a candidate for ablation of the greater saphenous vein 08/05/2020 upon evaluation today patient appears to be doing poorly in regard to her lower extremity. Unfortunately this has reopened after being healed on Friday. She tells me she was wearing her compression wrap but that even when she woke up in the morning on Saturday morning she was leaking. Fortunately there is no signs of active infection at this time systemically though there is some question of a thing locally. She does have juxta light compression bilaterally. 08/12/2020 on evaluation today patient appears to be doing well with regard to her leg ulcer which actually is closed again at this point. With that being said she is still having some issues currently with very thin skin that has come back over and again with her swelling if we do not wrap around afraid this is can open right back up. Nonetheless I do believe that she should proceed with the vascular testing next Thursday as previously ordered and recommended. Fortunately there is no signs of active infection at this time. 9/23; this is a patient I  discharged a few weeks ago. Apparently she had a reopening on the left leg medially. This is closed now. We have ordered venous reflux studies but they are not until 10/5 at [place] 10/29/2020 Patient is a now 87 year old female. She is somewhat frail however still  lives at home largely on her own. We had her in the clinic in August and September 2021 with a wound on the left medial lower extremity in the setting of chronic venous insufficiency and lymphedema. This closed reasonably easily and we discharged her to juxta lite stockings. After her discharge she had her venous reflux studies at interventional radiology. She was seen in consultation by Dr. Earleen Newport. She did not have evidence of thrombosis. She did have significant reflux of the bilateral greater saphenous vein on the right there was reflux from the saphenofemoral junction to the proximal calf where there is further decompression through network of varicosities on the left there was reflux from the saphenofemoral junction to the distal calf there is also a varicose network decompressing the calf. She was recommended I think for venous ablations of the greater saphenous vein on the left however she is wanting to defer that it into the new year. She tells me that very shortly after she left here the last time she was not able to put the juxta lite stocking on largely the inner sleeve. She developed swelling and then leakage and skin breakdown again in the left medial lower extremity. She was seen by podiatry on 3 occasions I think they were applying Unna boots. Her son says that at one point the [facility] boot was not put on properly his wife who is a nurse had to adjust this and the swelling is actually somewhat better. She arrives in clinic today with very significant swelling of the proximal two thirds of her lower extremity erythema superficial skin breakdown on the left medial ankle and calf and weeping edema fluid. Past medical history not much change from last time she has chronic venous insufficiency with very significant reflux and lymphedema. She states that she has trouble bending over to get the inner part of the juxta lite stocking on because of hip problems. Her ABI in this clinic was not  repeated but the last time she was here it was 1.13 she was not felt to have an arterial issue 12/9; the patient's wound on the left medial lower leg is again closed. She has chronic hemosiderin and stasis dermatitis left greater than right lipodermatosclerosis left greater than right. On the right leg today we are not wrapping she says she hit this on a screen door however there is no open area here. The right leg has a lot of swelling 12/21; this is a patient we discharged 10 days ago. She had bilateral juxta lite stockings and was supposed to be getting compression pumps from her son. According the patient her son tested positive for Covid and has been on quarantine and she is unable to get the internal part of the juxta lites on properly. She therefore has weeping edema left greater than right medial and is back in clinic. 12/28. Patient comes in with no open areas on the right leg. She still has very inflamed tenderness on the left medial ankle and lower calf. This could be cellulitis or stasis dermatitis. Also similar changes on the left lateral 1/6; patient returns to clinic. We did not wrap her right leg last time we wrapped the left. I gave her a course  of antibiotics because of tenderness on the left medial ankle possible cellulitis versus severe stasis dermatitis. Everything is taken care of on the left where the 3 layer compression was. Unfortunately on the right she has inflamed edema on the right medial ankle and right lateral ankle. Poorly controlled edema. She Kaven with her juxta lite stocking on but it is not wrapped tightly enough to control the swelling. Electronic Signature(s) Signed: 12/03/2020 5:18:36 PM By: Linton Ham MD Entered By: Linton Ham on 12/03/2020 14:11:07 -------------------------------------------------------------------------------- Physical Exam Details Patient Name: Date of Service: Jackie [name], [place]. 12/03/2020 1:00 PM Medical Record Number:  [id number] Patient Account Number: [id number] Date of Birth/Sex: Treating RN: [birth year] [name] (87 y.o. Jackie [name] Primary Care Provider: Reynold [name] Other Clinician: Referring Provider: Treating Provider/Extender: Jackie [name] in Treatment: 5 Constitutional Patient is hypertensive.. Pulse regular and within target range for patient.Marland Kitchen Respirations regular, non-labored and within target range.. Temperature is normal and within the target range for the patient.Marland Kitchen Appears in no distress. Notes Wound exam; her left medial ankle which was the problematic area last time with inflammation tenderness and erythema is completely resolved. She comes in today with the same type of presentation on the right she is tender on the right medial right lateral and erythema on the right lateral foot I think all of this is stasis dermatitis. She has a weeping area on the right anterior mid tibia but this is not an open wound. There is nothing open on the left leg Electronic Signature(s) Signed: 12/03/2020 5:18:36 PM By: Linton Ham MD Entered By: Linton Ham on 12/03/2020 14:12:15 -------------------------------------------------------------------------------- Physician Orders Details Patient Name: Date of Service: Jackie [name], [place]. 12/03/2020 1:00 PM Medical Record Number: [id number] Patient Account Number: [id number] Date of Birth/Sex: Treating RN: [birth year][name] (87 y.o. 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 Bathing/ Shower/ Hygiene May shower with protection but do not get wound dressing(s) wet. - May use cast protector Edema Control - Lymphedema / SCD / Other Bilateral Lower Extremities Lymphedema Pumps. Use Lymphedema pumps on leg(s) 2-3 times a day for 45-60 minutes. If wearing any wraps or hose, do not remove them. Continue exercising as instructed. - once available Elevate legs to the level of the heart or above for 30 minutes daily and/or when sitting, a frequency of: Avoid standing for long periods of time. Exercise regularly Non Wound Condition Bilateral Lower Extremities pply the following to affected area as directed: - TCA cream liberally to BOTH LEGS, silver alginate to any weeping areas then apply 3 layer A compression wrap. ***Please bring in juxtalites HD for both legs to clinic.*** Electronic Signature(s) Signed: 12/03/2020 5:18:36 PM By: Linton Ham MD Signed: 12/03/2020 6:28:52 PM By: Deon Pilling Entered By: Deon Pilling on 12/03/2020 14:12:56 -------------------------------------------------------------------------------- Problem List Details Patient Name: Date of Service: Jackie [name], [place]. 12/03/2020 1:00 PM Medical Record Number: [id number] Patient Account Number: [id number] Date of Birth/Sex: Treating RN: [birth year] [name] (87 y.o. Jackie [name] Primary Care Provider: Reynold [name] Other Clinician: Referring Provider: Treating Provider/Extender: Jackie [name] in Treatment: 5 Active Problems  ICD-10 Encounter Code Description Active Date MDM Diagnosis I87.332 Chronic venous hypertension (idiopathic) with ulcer and inflammation of left 10/29/2020 No Yes lower extremity I89.0 Lymphedema, not elsewhere classified 10/29/2020 No Yes L97.321 Non-pressure chronic ulcer of left ankle limited to breakdown of skin 10/29/2020 No Yes Inactive Problems Resolved Problems Electronic Signature(s) Signed: 12/03/2020 5:18:36 PM By: Linton Ham MD Entered By: Linton Ham on 12/03/2020 14:07:56 -------------------------------------------------------------------------------- Progress Note Details Patient Name: Date of Service: Jackie [name], [place]. 12/03/2020 1:00 PM Medical Record Number: [id number] Patient Account Number: [id number] Date of Birth/Sex: Treating RN: [birth year] [name] (87 y.o. Jackie [name] Primary Care Provider: Other Clinician: Reynold [name] Referring Provider: Treating Provider/Extender: Jackie [name] in Treatment: 5 Subjective History of Present Illness (HPI) ADMISSION 07/17/2020 This is an 87 year old woman who is referred by her nurse practitioner at [facility] who has been doing treatment for the wound on the right medial lower leg and ankle. Apparently 1 point a substantial wound however it is largely epithelialized now. Patient states its been there for about 5 or 6 weeks. On the background she has changes in the skin in this area I think the go back several years to when she had a left hip fracture. Nevertheless she does not have a wound history that I could elicit. As far as I know that they had been using TCA, Mepitel under an Unna boot they have been changing this weekly at [employer]. She also received a course of doxycycline. Past medical history very little that I can find in [place] link. She has a history of venous insufficiency bilaterally and a left hip fracture hypertension and a history of thyroidectomy for thyroid cancer. ABI in our clinic was 1.13 on the left 8/27; the patient is substantial initial wound by her description on the left medial lower leg and ankle is completely epithelialized. She has chronic venous insufficiency, dermatosclerosis and probably lymphedema. She does not have stockings and states that she would have trouble getting over the toe stockings on in any fashion because of hip problems except 9/3; this is  a patient with chronic venous insufficiency with a particularly difficult area on her left medial lower leg and ankle. My assessment of this is that she has chronic venous insufficiency and chronic venous hypertension. And at least on the left medial ankle stasis dermatitis. The area still looks angry and inflamed. Nevertheless it is epithelialized. I do not believe there is active infection. We have ordered her bilateral juxta lite stockings. These should be easier for her to put on but need to be adjusted to 30/40 compression. If this is not successful in maintaining this area I think she is going to need formal reflux studies to see if she might be a candidate for ablation of the greater saphenous vein 08/05/2020 upon evaluation today patient appears to be doing poorly in regard to her lower extremity. Unfortunately this has reopened after being healed on Friday. She tells me she was wearing her compression wrap but that even when she woke up in the morning on Saturday morning she was leaking. Fortunately there is no signs of active infection at this time systemically though there is some question of a thing locally. She does have juxta light compression bilaterally. 08/12/2020 on evaluation today patient appears to be doing well with regard to her leg ulcer which actually is closed again at this point. With that being said she is still having some issues currently with  very thin skin that has come back over and again with her swelling if we do not wrap around afraid this is can open right back up. Nonetheless I do believe that she should proceed with the vascular testing next Thursday as previously ordered and recommended. Fortunately there is no signs of active infection at this time. 9/23; this is a patient I discharged a few weeks ago. Apparently she had a reopening on the left leg medially. This is closed now. We have ordered venous reflux studies but they are not until 10/5 at [place] 10/29/2020 Patient is a now 87 year old female. She is somewhat frail however still lives at home largely on her own. We had her in the clinic in August and September 2021 with a wound on the left medial lower extremity in the setting of chronic venous insufficiency and lymphedema. This closed reasonably easily and we discharged her to juxta lite stockings. After her discharge she had her venous reflux studies at interventional radiology. She was seen in consultation by Dr. Earleen Newport. She did not have evidence of thrombosis. She did have significant reflux of the bilateral greater saphenous vein on the right there was reflux from the saphenofemoral junction to the proximal calf where there is further decompression through network of varicosities on the left there was reflux from the saphenofemoral junction to the distal calf there is also a varicose network decompressing the calf. She was recommended I think for venous ablations of the greater saphenous vein on the left however she is wanting to defer that it into the new year. She tells me that very shortly after she left here the last time she was not able to put the juxta lite stocking on largely the inner sleeve. She developed swelling and then leakage and skin breakdown again in the left medial lower extremity. She was seen by podiatry on 3 occasions I think they were applying Unna boots. Her son says that at one point the [facility] boot was not put on properly his wife who is a nurse had to adjust this and the swelling is actually somewhat better. She arrives in clinic today with very significant swelling of the proximal two thirds of her lower extremity erythema superficial skin breakdown on the left medial ankle and calf and weeping edema fluid. Past medical history not much change from last time she has chronic venous insufficiency with very significant reflux and lymphedema. She states that she has trouble bending over to get  the inner part of the juxta lite stocking on because of hip problems. Her ABI in this clinic was not repeated but the last time she was here it was 1.13 she was not felt to have an arterial issue 12/9; the patient's wound on the left medial lower leg is again closed. She has chronic hemosiderin and stasis dermatitis left greater than right lipodermatosclerosis left greater than right. On the right leg today we are not wrapping she says she hit this on a screen door however there is no open area here. The right leg has a lot of swelling 12/21; this is a patient we discharged 10 days ago. She had bilateral juxta lite stockings and was supposed to be getting compression pumps from her son. According the patient her son tested positive for Covid and has been on quarantine and she is unable to get the internal part of the juxta lites on properly. She therefore has weeping edema left greater than right medial and is back in clinic.  12/28. Patient comes in with no open areas on the right leg. She still has very inflamed tenderness on the left medial ankle and lower calf. This could be cellulitis or stasis dermatitis. Also similar changes on the left lateral 1/6; patient returns to clinic. We did not wrap her right leg last time we wrapped the left. I gave her a course of antibiotics because of tenderness on the left medial ankle possible cellulitis versus severe stasis dermatitis. Everything is taken care of on the left where the 3 layer compression was. Unfortunately on the right she has inflamed edema on the right medial ankle and right lateral ankle. Poorly controlled edema. She Kaven with her juxta lite stocking on but it is not wrapped tightly enough to control the swelling. Objective Constitutional Patient is hypertensive.. Pulse regular and within target range for patient.Marland Kitchen Respirations regular, non-labored and within target range.. Temperature is normal and within the target range for the patient.Marland Kitchen  Appears in no distress. Vitals Time Taken: 1:13 PM, Height: 61 in, Weight: 126 lbs, BMI: 23.8, Temperature: 97.7 F, Pulse: 71 bpm, Respiratory Rate: 16 breaths/min, Blood Pressure: 176/95 mmHg. General Notes: Wound exam; her left medial ankle which was the problematic area last time with inflammation tenderness and erythema is completely resolved. She comes in today with the same type of presentation on the right she is tender on the right medial right lateral and erythema on the right lateral foot I think all of this is stasis dermatitis. She has a weeping area on the right anterior mid tibia but this is not an open wound. There is nothing open on the left leg Assessment Active Problems ICD-10 Chronic venous hypertension (idiopathic) with ulcer and inflammation of left lower extremity Lymphedema, not elsewhere classified Non-pressure chronic ulcer of left ankle limited to breakdown of skin Procedures There was a Three Layer Compression Therapy Procedure by Baruch Gouty, RN. Post procedure Diagnosis Wound #: Same as Pre-Procedure Plan Follow-up Appointments: Return Appointment in 1 week. Bathing/ Shower/ Hygiene: May shower with protection but do not get wound dressing(s) wet. - May use cast protector Edema Control - Lymphedema / SCD / Other: Lymphedema Pumps. Use Lymphedema pumps on leg(s) 2-3 times a day for 45-60 minutes. If wearing any wraps or hose, do not remove them. Continue exercising as instructed. - once available Elevate legs to the level of the heart or above for 30 minutes daily and/or when sitting, a frequency of: Avoid standing for long periods of time. Exercise regularly Non Wound Condition: Apply the following to affected area as directed: - TCA cream liberally to BOTH LEGS, silver alginate to any weeping areas then apply 3 layer compression wrap. ***Please bring in juxtalites HD for both legs to clinic.*** 1. This is a patient with chronic venous insufficiency  with stasis dermatitis and lymphedema 2. She is generally too frail to get her juxta lites on tight enough to control the swelling and as a result today she has erythema and tenderness on the right ankle. The area that was there last week on the left is completely resolved. I do not think this was cellulitis at all I think it is all uncontrolled stasis dermatitis 3. I am going to wrap both legs this week and 3 layer compression. I have asked her to bring her juxta lite stockings next week. We will communicate with her daughter that she is going to need some form of assistance with this or she is going to have skin breakdown in her lower  legs. We cannot continue to wrap her for a situation in which there is no open wound Electronic Signature(s) Signed: 12/03/2020 5:18:36 PM By: Linton Ham MD Entered By: Linton Ham on 12/03/2020 14:13:59 -------------------------------------------------------------------------------- SuperBill Details Patient Name: Date of Service: Jackie [name], Jackie [name]. 12/03/2020 Medical Record Number: [id number] Patient Account Number: [id number] Date of Birth/Sex: Treating RN: [birth year] [name] (87 y.o. Jackie [name] Primary Care Provider: Reynold [name] Other Clinician: Referring Provider: Treating Provider/Extender: Jackie [name] in Treatment: 5 Diagnosis Coding ICD-10 Codes Code Description [phone] Chronic venous hypertension (idiopathic) with ulcer and inflammation of left lower extremity I89.0 Lymphedema, not elsewhere classified L97.321 Non-pressure chronic ulcer of left ankle limited to breakdown of skin Facility Procedures CPT4: Code [id number] Description: 81 BILATERAL: Application of multi-layer venous compression system; leg (below knee), including ankle and t. Modifier: Quantity: 1 Physician Procedures : CPT4 Code Description Modifier [id number] - WC PHYS LEVEL 3 - EST PT ICD-10 Diagnosis Description I87.332 Chronic  venous hypertension (idiopathic) with ulcer and inflammation of left lower extremity I89.0 Lymphedema, not elsewhere classified L97.321  Non-pressure chronic ulcer of left ankle limited to breakdown of skin Quantity: 1 Electronic Signature(s) Signed: 12/03/2020 6:28:52 PM By: Deon Pilling Signed: 12/04/2020 4:48:18 PM By: Linton Ham MD Previous Signature: 12/03/2020 5:18:36 PM Version By: Linton Ham MD Entered By: Deon Pilling on 12/03/2020 18:06:46

## 2020-12-07 NOTE — Progress Notes (Signed)
RAYOLA, [place]. ([id number]) Visit Report for 12/03/2020 Arrival Information Details Patient Name: Date of Service: Jackie [name] CE M. 12/03/2020 1:00 PM Medical Record Number: [id number] Patient Account Number: [id number] Date of Birth/Sex: Treating RN: [birth year] (87 y.o. Jackie [name] Primary Care Kalene Cutler: [name] Other Clinician: Referring Kenise Barraco: Treating Takeria Marquina/Extender: [name] in Treatment: 5 Visit Information History Since Last Visit Added or deleted any medications: No Patient Arrived: [name] Any new allergies or adverse reactions: No Arrival Time: 13:12 Had a fall or experienced change in No Accompanied By: alone activities of daily living that may affect Transfer Assistance: None risk of falls: Patient Identification Verified: Yes Signs or symptoms of abuse/neglect since last visito No Secondary Verification Process Completed: Yes Hospitalized since last visit: No Patient Requires Transmission-Based Precautions: No Implantable device outside of the clinic excluding No Patient Has Alerts: Yes cellular tissue based products placed in the center Patient Alerts: ABI: L 1.13 07/2020 since last visit: Has Dressing in Place as Prescribed: Yes Has Compression in Place as Prescribed: Yes Pain Present Now: No Electronic Signature(s) Signed: 12/07/2020 5:04:19 PM By: Levan Hurst RN, BSN Entered By: Levan Hurst on 12/03/2020 13:27:46 -------------------------------------------------------------------------------- Compression Therapy Details Patient Name: Date of Service: Jackie [name], [place]. 12/03/2020 1:00 PM Medical Record Number: [id number] Patient Account Number: [id number] Date of Birth/Sex: Treating RN: 1932/09/08 (87 y.o. Jackie [name] Primary Care Roylene Heaton: [name] Other Clinician: Referring Brinly Maietta: Treating Maison Kestenbaum/Extender: [name] in Treatment: 5 Compression Therapy Performed  for Wound Assessment: NonWound Condition Lymphedema - Bilateral Leg Performed By: Clinician Baruch Gouty, RN Compression Type: Three Layer Post Procedure Diagnosis Same as Pre-procedure Electronic Signature(s) Signed: 12/03/2020 6:28:52 PM By: Deon Pilling Entered By: Deon Pilling on 12/03/2020 14:03:17 -------------------------------------------------------------------------------- Encounter Discharge Information Details Patient Name: Date of Service: Jackie [name], [place]. 12/03/2020 1:00 PM Medical Record Number: [id number] Patient Account Number: [id number] Date of Birth/Sex: Treating RN: [birth year] (87 y.o. Jackie [name] Primary Care Emelly Wurtz: [name] Other Clinician: Referring Ariya Bohannon: Treating Fawna Cranmer/Extender: [name] in Treatment: 5 Encounter Discharge Information Items Discharge Condition: Stable Ambulatory Status: Cane Discharge Destination: Home Transportation: Private Auto Accompanied By: self Schedule Follow-up Appointment: Yes Clinical Summary of Care: Patient Declined Electronic Signature(s) Signed: 12/03/2020 6:02:34 PM By: Baruch Gouty RN, BSN Entered By: Baruch Gouty on 12/03/2020 14:31:57 -------------------------------------------------------------------------------- Lower Extremity Assessment Details Patient Name: Date of Service: Jackie [name], [place]. 12/03/2020 1:00 PM Medical Record Number: [id number] Patient Account Number: [id number] Date of Birth/Sex: Treating RN: 04-28-1932 (87 y.o. Jackie [name] Primary Care Indiyah Paone: [name] Other Clinician: Referring Melinda Pottinger: Treating Kynsli Haapala/Extender: [name] in Treatment: 5 Edema Assessment Assessed: Jackie [name]: No] [Right: No] Edema: [Left: Yes] [Right: Yes] Calf Left: Right: Point of Measurement: 38 cm From Medial Instep 32 cm 30.5 cm Ankle Left: Right: Point of Measurement: 9 cm From Medial Instep 21 cm 22  cm Vascular Assessment Pulses: Dorsalis Pedis Palpable: [Left:Yes] [Right:Yes] Electronic Signature(s) Signed: 12/07/2020 5:04:19 PM By: Levan Hurst RN, BSN Entered By: Levan Hurst on 12/03/2020 13:28:44 -------------------------------------------------------------------------------- [place] Details Patient Name: Date of Service: Jackie [name], [place]. 12/03/2020 1:00 PM Medical Record Number: [id number] Patient Account Number: [id number] Date of Birth/Sex: Treating RN: [birth year] (87 y.o. Jackie [name] Primary Care Chaunce Winkels: [name] Other Clinician: Referring Christapher Gillian: Treating Luisana Lutzke/Extender: [name] in Treatment: 5 Active Inactive Wound/Skin Impairment Nursing Diagnoses: Knowledge  deficit related to ulceration/compromised skin integrity Goals: Patient/caregiver will verbalize understanding of skin care regimen Date Initiated: 10/29/2020 Target Resolution Date: 12/25/2020 Goal Status: Active Interventions: Assess patient/caregiver ability to obtain necessary supplies Assess patient/caregiver ability to perform ulcer/skin care regimen upon admission and as needed Provide education on ulcer and skin care Treatment Activities: Skin care regimen initiated : 10/29/2020 Topical wound management initiated : 10/29/2020 Notes: Electronic Signature(s) Signed: 12/03/2020 6:28:52 PM By: Deon Pilling Entered By: Deon Pilling on 12/03/2020 14:03:33 -------------------------------------------------------------------------------- Non-Wound Condition Assessment Details Patient Name: Date of Service: Jackie [name], [place]. 12/03/2020 1:00 PM Medical Record Number: [id number] Patient Account Number: [id number] Date of Birth/Sex: Treating RN: Aug 08, 1932 (87 y.o. Jackie [name] Primary Care Melitza Metheny: [name] Other Clinician: Referring Jaryd Drew: Treating Willine Schwalbe/Extender: [name] in Treatment:  5 Non-Wound Condition: Condition: Lymphedema Location: Leg Side: Bilateral Notes: leg ankles weeping Notes right leg weeping, redness to ankle area. Electronic Signature(s) Signed: 12/03/2020 6:28:52 PM By: Deon Pilling Entered By: Deon Pilling on 12/03/2020 13:59:36 -------------------------------------------------------------------------------- Pain Assessment Details Patient Name: Date of Service: Jackie [name], [place]. 12/03/2020 1:00 PM Medical Record Number: [id number] Patient Account Number: [id number] Date of Birth/Sex: Treating RN: Jun 30, 1932 (87 y.o. Jackie [name] Primary Care Meylin Stenzel: [name] Other Clinician: Referring Jonte Shiller: Treating Hurley Sobel/Extender: [name] in Treatment: 5 Active Problems Location of Pain Severity and Description of Pain Patient Has Paino No Site Locations Pain Management and Medication Current Pain Management: Electronic Signature(s) Signed: 12/07/2020 5:04:19 PM By: Levan Hurst RN, BSN Entered By: Levan Hurst on 12/03/2020 13:28:20 -------------------------------------------------------------------------------- Patient/Caregiver Education Details Patient Name: Date of Service: Jackie [name], [place]. 1/6/2022andnbsp1:00 PM Medical Record Number: [id number] Patient Account Number: [id number] Date of Birth/Gender: Treating RN: 12-29-1931 (87 y.o. Jackie [name] Primary Care Physician: [name] Other Clinician: Referring Physician: Treating Physician/Extender: [name] in Treatment: 5 Education Assessment Education Provided To: Patient Education Topics Provided Wound/Skin Impairment: Handouts: Skin Care Do's and Dont's Methods: Explain/Verbal Responses: Reinforcements needed Electronic Signature(s) Signed: 12/03/2020 6:28:52 PM By: Deon Pilling Entered By: Deon Pilling on 12/03/2020  14:03:49 -------------------------------------------------------------------------------- Vitals Details Patient Name: Date of Service: Jackie [name], [place]. 12/03/2020 1:00 PM Medical Record Number: [id number] Patient Account Number: [id number] Date of Birth/Sex: Treating RN: December 11, 1931 (87 y.o. Jackie [name] Primary Care Zeidy Tayag: [name] Other Clinician: Referring Mujahid Jalomo: Treating Sim Choquette/Extender: [name] in Treatment: 5 Vital Signs Time Taken: 13:13 Temperature (F): 97.7 Height (in): 61 Pulse (bpm): 71 Weight (lbs): 126 Respiratory Rate (breaths/min): 16 Body Mass Index (BMI): 23.8 Blood Pressure (mmHg): 176/95 Reference Range: 80 - 120 mg / dl Electronic Signature(s) Signed: 12/07/2020 5:04:19 PM By: Levan Hurst RN, BSN Entered By: Levan Hurst on 12/03/2020 13:28:15

## 2020-12-10 ENCOUNTER — Other Ambulatory Visit: Payer: Self-pay

## 2020-12-10 ENCOUNTER — Encounter (HOSPITAL_BASED_OUTPATIENT_CLINIC_OR_DEPARTMENT_OTHER): Payer: Medicare Other | Admitting: Internal Medicine

## 2020-12-10 DIAGNOSIS — I87332 Chronic venous hypertension (idiopathic) with ulcer and inflammation of left lower extremity: Secondary | ICD-10-CM | POA: Diagnosis not present

## 2020-12-10 NOTE — Progress Notes (Signed)
TONJIA, [place]. ([id number]) Visit Report for 12/10/2020 Arrival Information Details Patient Name: Date of Service: Jackie [name] CE M. 12/10/2020 1:15 PM Medical Record Number: [id number] Patient Account Number: [id number] Date of Birth/Sex: Treating RN: 1932/03/11 (87 y.o. [name], [name] Primary Care Kaan Tosh: [name] Other Clinician: Referring Zeinab Rodwell: Treating Latifa Noble/Extender: [name] in Treatment: 6 Visit Information History Since Last Visit Added or deleted any medications: No Patient Arrived: Ambulatory Any new allergies or adverse reactions: No Arrival Time: 13:45 Had a fall or experienced change in No Accompanied By: daughter activities of daily living that may affect Transfer Assistance: None risk of falls: Patient Identification Verified: Yes Signs or symptoms of abuse/neglect since last visito No Secondary Verification Process Completed: Yes Hospitalized since last visit: No Patient Requires Transmission-Based Precautions: No Implantable device outside of the clinic excluding No Patient Has Alerts: Yes cellular tissue based products placed in the center Patient Alerts: ABI: L 1.13 07/2020 since last visit: Pain Present Now: No Electronic Signature(s) Signed: 12/10/2020 4:03:30 PM By: Mikeal Hawthorne EMT/HBOT/SD Entered By: Mikeal Hawthorne on 12/10/2020 13:51:57 -------------------------------------------------------------------------------- Clinic Level of Care Assessment Details Patient Name: Date of Service: Jackie [name], [place]. 12/10/2020 1:15 PM Medical Record Number: [id number] Patient Account Number: [id number] Date of Birth/Sex: Treating RN: Jun 17, 1932 (87 y.o. [name], [name] Primary Care Noni Stonesifer: [name] Other Clinician: Referring Ednamae Schiano: Treating Klarissa Mcilvain/Extender: [name] in Treatment: 6 Clinic Level of Care Assessment Items TOOL 4 Quantity Score X- 1 0 Use when only an  EandM is performed on FOLLOW-UP visit ASSESSMENTS - Nursing Assessment / Reassessment X- 1 10 Reassessment of Co-morbidities (includes updates in patient status) X- 1 5 Reassessment of Adherence to Treatment Plan ASSESSMENTS - Wound and Skin A ssessment / Reassessment []  - 0 Simple Wound Assessment / Reassessment - one wound []  - 0 Complex Wound Assessment / Reassessment - multiple wounds X- 1 10 Dermatologic / Skin Assessment (not related to wound area) ASSESSMENTS - Focused Assessment X- 2 5 Circumferential Edema Measurements - multi extremities X- 1 10 Nutritional Assessment / Counseling / Intervention []  - 0 Lower Extremity Assessment (monofilament, tuning fork, pulses) []  - 0 Peripheral Arterial Disease Assessment (using hand held doppler) ASSESSMENTS - Ostomy and/or Continence Assessment and Care []  - 0 Incontinence Assessment and Management []  - 0 Ostomy Care Assessment and Management (repouching, etc.) PROCESS - Coordination of Care X - Simple Patient / Family Education for ongoing care 1 15 []  - 0 Complex (extensive) Patient / Family Education for ongoing care X- 1 10 Staff obtains Programmer, systems, Records, T Results / Process Orders est []  - 0 Staff telephones HHA, Nursing Homes / Clarify orders / etc []  - 0 Routine Transfer to another Facility (non-emergent condition) []  - 0 Routine Hospital Admission (non-emergent condition) []  - 0 New Admissions / Biomedical engineer / Ordering NPWT Apligraf, etc. , []  - 0 Emergency Hospital Admission (emergent condition) X- 1 10 Simple Discharge Coordination []  - 0 Complex (extensive) Discharge Coordination PROCESS - Special Needs []  - 0 Pediatric / Minor Patient Management []  - 0 Isolation Patient Management []  - 0 Hearing / Language / Visual special needs []  - 0 Assessment of Community assistance (transportation, D/C planning, etc.) []  - 0 Additional assistance / Altered mentation []  - 0 Support Surface(s)  Assessment (bed, cushion, seat, etc.) INTERVENTIONS - Wound Cleansing / Measurement []  - 0 Simple Wound Cleansing - one wound []  - 0 Complex Wound Cleansing - multiple wounds []  -  0 Wound Imaging (photographs - any number of wounds) []  - 0 Wound Tracing (instead of photographs) []  - 0 Simple Wound Measurement - one wound []  - 0 Complex Wound Measurement - multiple wounds INTERVENTIONS - Wound Dressings []  - 0 Small Wound Dressing one or multiple wounds []  - 0 Medium Wound Dressing one or multiple wounds []  - 0 Large Wound Dressing one or multiple wounds []  - 0 Application of Medications - topical []  - 0 Application of Medications - injection INTERVENTIONS - Miscellaneous []  - 0 External ear exam []  - 0 Specimen Collection (cultures, biopsies, blood, body fluids, etc.) []  - 0 Specimen(s) / Culture(s) sent or taken to Lab for analysis []  - 0 Patient Transfer (multiple staff / Civil Service fast streamer / Similar devices) []  - 0 Simple Staple / Suture removal (25 or less) []  - 0 Complex Staple / Suture removal (26 or more) []  - 0 Hypo / Hyperglycemic Management (close monitor of Blood Glucose) []  - 0 Ankle / Brachial Index (ABI) - do not check if billed separately X- 1 5 Vital Signs Has the patient been seen at the hospital within the last three years: Yes Total Score: 85 Level Of Care: New/Established - Level 3 Electronic Signature(s) Signed: 12/10/2020 5:39:22 PM By: Deon Pilling Entered By: Deon Pilling on 12/10/2020 14:23:26 -------------------------------------------------------------------------------- Encounter Discharge Information Details Patient Name: Date of Service: Jackie [name], [place]. 12/10/2020 1:15 PM Medical Record Number: [id number] Patient Account Number: [id number] Date of Birth/Sex: Treating RN: Aug 24, 1932 (87 y.o. [name] Primary Care Trude Cansler: [name] Other Clinician: Referring Radwan Cowley: Treating Lachelle Rissler/Extender: [name] in Treatment: 6 Encounter Discharge Information Items Discharge Condition: Stable Ambulatory Status: Cane Discharge Destination: Home Transportation: Private Auto Accompanied By: Charolett Bumpers Schedule Follow-up Appointment: Yes Clinical Summary of Care: Patient Declined Electronic Signature(s) Signed: 12/10/2020 5:40:31 PM By: Baruch Gouty RN, BSN Entered By: Baruch Gouty on 12/10/2020 15:05:05 -------------------------------------------------------------------------------- Lower Extremity Assessment Details Patient Name: Date of Service: Jackie [name], [place]. 12/10/2020 1:15 PM Medical Record Number: [id number] Patient Account Number: [id number] Date of Birth/Sex: Treating RN: 1932/10/31 (87 y.o. [name] Primary Care Marylynn Rigdon: [name] Other Clinician: Referring Jaxsin Bottomley: Treating Catheleen Langhorne/Extender: [name] in Treatment: 6 Edema Assessment Assessed: [name]: No] [Right: No] Edema: [Left: Yes] [Right: Yes] Calf Left: Right: Point of Measurement: 38 cm From Medial Instep 39 cm 35 cm Ankle Left: Right: Point of Measurement: 9 cm From Medial Instep 22 cm 23 cm Vascular Assessment Pulses: Dorsalis Pedis Palpable: [Left:Yes] [Right:Yes] Electronic Signature(s) Signed: 12/10/2020 4:03:30 PM By: Mikeal Hawthorne EMT/HBOT/SD Signed: 12/10/2020 5:39:22 PM By: Deon Pilling Entered By: Mikeal Hawthorne on 12/10/2020 13:54:46 -------------------------------------------------------------------------------- Multi Wound Chart Details Patient Name: Date of Service: Jackie [name], [place]. 12/10/2020 1:15 PM Medical Record Number: [id number] Patient Account Number: [id number] Date of Birth/Sex: Treating RN: [birth year] (87 y.o. [name] Primary Care Hayslee Casebolt: [name] Other Clinician: Referring Jamori Biggar: Treating Leonette Tischer/Extender: [name] in Treatment: 6 Vital Signs Height(in): 61 Pulse(bpm):  68 Weight(lbs): 126 Blood Pressure(mmHg): 142/73 Body Mass Index(BMI): 24 Temperature(F): 97.9 Respiratory Rate(breaths/min): 14 Wound Assessments Treatment Notes Electronic Signature(s) Signed: 12/10/2020 5:01:02 PM By: Linton Ham MD Signed: 12/10/2020 5:39:22 PM By: Deon Pilling Entered By: Linton Ham on 12/10/2020 14:39:06 -------------------------------------------------------------------------------- Multi-Disciplinary Care Plan Details Patient Name: Date of Service: Jackie [name], [place]. 12/10/2020 1:15 PM Medical Record Number: [id number] Patient Account Number: [id number] Date of Birth/Sex: Treating RN: May 06, 1932 (  87 y.o. [name] Primary Care Joren Rehm: [name] Other Clinician: Referring Willson Lipa: Treating Talonda Artist/Extender: [name] in Treatment: 6 Active Inactive Electronic Signature(s) Signed: 12/10/2020 5:39:22 PM By: Deon Pilling Entered By: Deon Pilling on 12/10/2020 14:11:50 -------------------------------------------------------------------------------- Pain Assessment Details Patient Name: Date of Service: Jackie [name], [place]. 12/10/2020 1:15 PM Medical Record Number: [id number] Patient Account Number: [id number] Date of Birth/Sex: Treating RN: 09/03/1932 (87 y.o. [name] Primary Care Sharone Almond: [name] Other Clinician: Referring Lyndal Alamillo: Treating Arianie Couse/Extender: [name] in Treatment: 6 Active Problems Location of Pain Severity and Description of Pain Patient Has Paino No Site Locations With Dressing Change: No Pain Management and Medication Current Pain Management: Electronic Signature(s) Signed: 12/10/2020 4:03:30 PM By: Mikeal Hawthorne EMT/HBOT/SD Signed: 12/10/2020 5:39:22 PM By: Deon Pilling Entered By: Mikeal Hawthorne on 12/10/2020 13:52:33 -------------------------------------------------------------------------------- Patient/Caregiver Education  Details Patient Name: Date of Service: Jackie [name], [place]. 1/13/2022andnbsp1:15 PM Medical Record Number: [id number] Patient Account Number: [id number] Date of Birth/Gender: Treating RN: [birth year] (87 y.o. [name] Primary Care Physician: [name] Other Clinician: Referring Physician: Treating Physician/Extender: [name] in Treatment: 6 Education Assessment Education Provided To: Patient and Caregiver daughter Karna Christmas Education Topics Provided Venous: Handouts: Controlling Swelling with Compression Stockings Methods: Explain/Verbal Responses: Reinforcements needed Electronic Signature(s) Signed: 12/10/2020 5:39:22 PM By: Deon Pilling Entered By: Deon Pilling on 12/10/2020 13:40:48 -------------------------------------------------------------------------------- Vitals Details Patient Name: Date of Service: Jackie [name], [place]. 12/10/2020 1:15 PM Medical Record Number: [id number] Patient Account Number: [id number] Date of Birth/Sex: Treating RN: [birth year] (87 y.o. [name], [name] Primary Care Melody Savidge: [name] Other Clinician: Referring Kaydance Bowie: Treating Raine Blodgett/Extender: [name] in Treatment: 6 Vital Signs Time Taken: 13:50 Temperature (F): 97.9 Height (in): 61 Pulse (bpm): 68 Weight (lbs): 126 Respiratory Rate (breaths/min): 14 Body Mass Index (BMI): 23.8 Blood Pressure (mmHg): 142/73 Reference Range: 80 - 120 mg / dl Electronic Signature(s) Signed: 12/10/2020 4:03:30 PM By: Mikeal Hawthorne EMT/HBOT/SD Entered By: Mikeal Hawthorne on 12/10/2020 13:52:21

## 2020-12-10 NOTE — Progress Notes (Signed)
Jackie [name] [place]. ([id number]) Visit Report for 12/10/2020 HPI Details Patient Name: Date of Service: Jackie [name] CE M. 12/10/2020 1:15 PM Medical Record Number: [id number] Patient Account Number: [id number] Date of Birth/Sex: Treating RN: [birth year] (87 y.o. Jackie [name] Primary Care Provider: [name] Other Clinician: Referring Provider: Treating Provider/Extender: [name] in Treatment: 6 History of Present Illness HPI Description: ADMISSION 07/17/2020 This is an 87 year old woman who is referred by her nurse practitioner at [facility] who has been doing treatment for the wound on the right medial lower leg and ankle. Apparently 1 point a substantial wound however it is largely epithelialized now. Patient states its been there for about 5 or 6 weeks. On the background she has changes in the skin in this area I think the go back several years to when she had a left hip fracture. Nevertheless she does not have a wound history that I could elicit. As far as I know that they had been using TCA, Mepitel under an Unna boot they have been changing this weekly at [employer]. She also received a course of doxycycline. Past medical history very little that I can find in [place] link. She has a history of venous insufficiency bilaterally and a left hip fracture hypertension and a history of thyroidectomy for thyroid cancer. ABI in our clinic was 1.13 on the left 8/27; the patient is substantial initial wound by her description on the left medial lower leg and ankle is completely epithelialized. She has chronic venous insufficiency, dermatosclerosis and probably lymphedema. She does not have stockings and states that she would have trouble getting over the toe stockings on in any fashion because of hip problems except 9/3; this is a patient with chronic venous insufficiency with a particularly difficult area on her left medial  lower leg and ankle. My assessment of this is that she has chronic venous insufficiency and chronic venous hypertension. And at least on the left medial ankle stasis dermatitis. The area still looks angry and inflamed. Nevertheless it is epithelialized. I do not believe there is active infection. We have ordered her bilateral juxta lite stockings. These should be easier for her to put on but need to be adjusted to 30/40 compression. If this is not successful in maintaining this area I think she is going to need formal reflux studies to see if she might be a candidate for ablation of the greater saphenous vein 08/05/2020 upon evaluation today patient appears to be doing poorly in regard to her lower extremity. Unfortunately this has reopened after being healed on Friday. She tells me she was wearing her compression wrap but that even when she woke up in the morning on Saturday morning she was leaking. Fortunately there is no signs of active infection at this time systemically though there is some question of a thing locally. She does have juxta light compression bilaterally. 08/12/2020 on evaluation today patient appears to be doing well with regard to her leg ulcer which actually is closed again at this point. With that being said she is still having some issues currently with very thin skin that has come back over and again with her swelling if we do not wrap around afraid this is can open right back up. Nonetheless I do believe that she should proceed with the vascular testing next Thursday as previously ordered and recommended. Fortunately there is no signs of active infection at this time. 9/23; this is a patient I  discharged a few weeks ago. Apparently she had a reopening on the left leg medially. This is closed now. We have ordered venous reflux studies but they are not until 10/5 at [place] 10/29/2020 Patient is a now 87 year old female. She is somewhat frail however still  lives at home largely on her own. We had her in the clinic in August and September 2021 with a wound on the left medial lower extremity in the setting of chronic venous insufficiency and lymphedema. This closed reasonably easily and we discharged her to juxta lite stockings. After her discharge she had her venous reflux studies at interventional radiology. She was seen in consultation by Dr. Earleen Newport. She did not have evidence of thrombosis. She did have significant reflux of the bilateral greater saphenous vein on the right there was reflux from the saphenofemoral junction to the proximal calf where there is further decompression through network of varicosities on the left there was reflux from the saphenofemoral junction to the distal calf there is also a varicose network decompressing the calf. She was recommended I think for venous ablations of the greater saphenous vein on the left however she is wanting to defer that it into the new year. She tells me that very shortly after she left here the last time she was not able to put the juxta lite stocking on largely the inner sleeve. She developed swelling and then leakage and skin breakdown again in the left medial lower extremity. She was seen by podiatry on 3 occasions I think they were applying Unna boots. Her son says that at one point the [facility] boot was not put on properly his wife who is a nurse had to adjust this and the swelling is actually somewhat better. She arrives in clinic today with very significant swelling of the proximal two thirds of her lower extremity erythema superficial skin breakdown on the left medial ankle and calf and weeping edema fluid. Past medical history not much change from last time she has chronic venous insufficiency with very significant reflux and lymphedema. She states that she has trouble bending over to get the inner part of the juxta lite stocking on because of hip problems. Her ABI in this clinic was not  repeated but the last time she was here it was 1.13 she was not felt to have an arterial issue 12/9; the patient's wound on the left medial lower leg is again closed. She has chronic hemosiderin and stasis dermatitis left greater than right lipodermatosclerosis left greater than right. On the right leg today we are not wrapping she says she hit this on a screen door however there is no open area here. The right leg has a lot of swelling 12/21; this is a patient we discharged 10 days ago. She had bilateral juxta lite stockings and was supposed to be getting compression pumps from her son. According the patient her son tested positive for Covid and has been on quarantine and she is unable to get the internal part of the juxta lites on properly. She therefore has weeping edema left greater than right medial and is back in clinic. 12/28. Patient comes in with no open areas on the right leg. She still has very inflamed tenderness on the left medial ankle and lower calf. This could be cellulitis or stasis dermatitis. Also similar changes on the left lateral 1/6; patient returns to clinic. We did not wrap her right leg last time we wrapped the left. I gave her a course  of antibiotics because of tenderness on the left medial ankle possible cellulitis versus severe stasis dermatitis. Everything is taken care of on the left where the 3 layer compression was. Unfortunately on the right she has inflamed edema on the right medial ankle and right lateral ankle. Poorly controlled edema. She Kaven with her juxta lite stocking on but it is not wrapped tightly enough to control the swelling. 1/13; she comes back in today after a week of compression wraps on all her wounds are healed. Her skin is dry and flaky she has stasis dermatitis of predominantly in the right medial greater than left medial lower leg extending into her foot on the right. She has a bag of stockings and related apparel including juxta lite  stockings, over the toe compression stockings, a sock Donner. She is for 1 reason or another not able to get these things on at home on her own she comes in today with her daughter from [place]. She got external compression pumps delivered the other day but has not started to use them. 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 12/10/2020 1:15 PM Medical Record Number: [id number] Patient Account Number: [id number] Date of Birth/Sex: Treating RN: [birth year] (87 y.o. Jackie [name] Primary Care Provider: [name] Other Clinician: Referring Provider: Treating Provider/Extender: [name] in Treatment: 6 Constitutional Sitting or standing Blood Pressure is within target range for patient.. Pulse regular and within target range for patient.Marland Kitchen Respirations regular, non-labored and within target range.. Temperature is normal and within the target range for the patient.Marland Kitchen Appears in no distress. Cardiovascular She does not appear to have. Notes Wound exam; left medial ankle which was a problematic area from 2 weeks ago is closed right. Right medial ankle which was the problematic area last week is also closed. Her edema control is reasonably good with our 3 layer compression. Electronic Signature(s) Signed: 12/10/2020 5:01:02 PM By: Linton Ham MD Entered By: Linton Ham on 12/10/2020 14:41:42 -------------------------------------------------------------------------------- Physician Orders Details Patient Name: Date of Service: Jackie [name], [place]. 12/10/2020 1:15 PM Medical Record Number: [id number] Patient Account Number: [id number] Date of Birth/Sex: Treating RN: 14-Aug-1932  (87 y.o. Jackie [name] Primary Care Provider: [name] Other Clinician: Referring Provider: Treating Provider/Extender: [name] in Treatment: 6 Verbal / Phone Orders: No Diagnosis Coding ICD-10 Coding Code Description [phone] Chronic venous hypertension (idiopathic) with ulcer and inflammation of left lower extremity I89.0 Lymphedema, not elsewhere classified L97.321 Non-pressure chronic ulcer of left ankle limited to breakdown of skin Discharge From [facility] Discharge from [place] - call if any wounds reopen. Please wear stockings, lotion, and use lymphedema pumps,. Edema Control - Lymphedema / SCD / Other Lymphedema Pumps. Use Lymphedema pumps on leg(s) 2-3 times a day for 45-60 minutes. If wearing any wraps or hose, do not remove them. Continue exercising as instructed. - Use the pumps twice a day for an hour each time. Avoid standing for long periods of time. Exercise regularly Moisturize legs daily. - both legs every night. Compression stocking or Garment 30-40 mm/Hg pressure to: - juxtalite HD to bilateral legs. Apply in the morning and remove at night. Electronic Signature(s) Signed: 12/10/2020 5:01:02 PM By: Linton Ham MD Signed: 12/10/2020 5:39:22 PM By: Deon Pilling Entered By: Deon Pilling  on 12/10/2020 14:22:46 -------------------------------------------------------------------------------- Problem List Details Patient Name: Date of Service: Jackie [name] CE M. 12/10/2020 1:15 PM Medical Record Number: [id number] Patient Account Number: [id number] Date of Birth/Sex: Treating RN: [birth year] (87 y.o. Jackie [name] Primary Care Provider: [name] Other Clinician: Referring Provider: Treating Provider/Extender: [name] in Treatment: 6 Active Problems ICD-10 Encounter Code Description Active Date MDM Diagnosis I87.332 Chronic venous hypertension (idiopathic) with ulcer and  inflammation of left 10/29/2020 No Yes lower extremity I89.0 Lymphedema, not elsewhere classified 10/29/2020 No Yes L97.321 Non-pressure chronic ulcer of left ankle limited to breakdown of skin 10/29/2020 No Yes Inactive Problems Resolved Problems Electronic Signature(s) Signed: 12/10/2020 5:01:02 PM By: Linton Ham MD Entered By: Linton Ham on 12/10/2020 14:39:00 -------------------------------------------------------------------------------- Progress Note Details Patient Name: Date of Service: Jackie [name], [place]. 12/10/2020 1:15 PM Medical Record Number: [id number] Patient Account Number: [id number] Date of Birth/Sex: Treating RN: 12-09-1931 (87 y.o. Jackie [name] Primary Care Provider: [name] Other Clinician: Referring Provider: Treating Provider/Extender: [name] in Treatment: 6 Subjective History of Present Illness (HPI) ADMISSION 07/17/2020 This is an 87 year old woman who is referred by her nurse practitioner at [facility] who has been doing treatment for the wound on the right medial lower leg and ankle. Apparently 1 point a substantial wound however it is largely epithelialized now. Patient states its been there for about 5 or 6 weeks. On the background she has changes in the skin in this area I think the go back several years to when she had a left hip fracture. Nevertheless she does not have a wound history that I could elicit. As far as I know that they had been using TCA, Mepitel under an Unna boot they have been changing this weekly at [employer] [facility] [employer]. She also received a course of doxycycline. Past medical history very little that I can find in [place] link. She has a history of venous insufficiency bilaterally and a left hip fracture hypertension and a history of thyroidectomy for thyroid cancer. ABI in our clinic was 1.13 on the left 8/27; the patient is substantial initial wound by her  description on the left medial lower leg and ankle is completely epithelialized. She has chronic venous insufficiency, dermatosclerosis and probably lymphedema. She does not have stockings and states that she would have trouble getting over the toe stockings on in any fashion because of hip problems except 9/3; this is a patient with chronic venous insufficiency with a particularly difficult area on her left medial lower leg and ankle. My assessment of this is that she has chronic venous insufficiency and chronic venous hypertension. And at least on the left medial ankle stasis dermatitis. The area still looks angry and inflamed. Nevertheless it is epithelialized. I do not believe there is active infection. We have ordered her bilateral juxta lite stockings. These should be easier for her to put on but need to be adjusted to 30/40 compression. If this is not successful in maintaining this area I think she is going to need formal reflux studies to see if she might be a candidate for ablation of the greater saphenous vein 08/05/2020 upon evaluation today patient appears to be doing poorly in regard to her lower extremity. Unfortunately this has reopened after being healed on Friday. She tells me she was wearing her compression wrap but that even when she woke up in the morning on Saturday morning she was leaking. Fortunately there is  no signs of active infection at this time systemically though there is some question of a thing locally. She does have juxta light compression bilaterally. 08/12/2020 on evaluation today patient appears to be doing well with regard to her leg ulcer which actually is closed again at this point. With that being said she is still having some issues currently with very thin skin that has come back over and again with her swelling if we do not wrap around afraid this is can open right back up. Nonetheless I do believe that she should proceed with the vascular testing next Thursday  as previously ordered and recommended. Fortunately there is no signs of active infection at this time. 9/23; this is a patient I discharged a few weeks ago. Apparently she had a reopening on the left leg medially. This is closed now. We have ordered venous reflux studies but they are not until 10/5 at [place] 10/29/2020 Patient is a now 87 year old female. She is somewhat frail however still lives at home largely on her own. We had her in the clinic in August and September 2021 with a wound on the left medial lower extremity in the setting of chronic venous insufficiency and lymphedema. This closed reasonably easily and we discharged her to juxta lite stockings. After her discharge she had her venous reflux studies at interventional radiology. She was seen in consultation by Dr. Earleen Newport. She did not have evidence of thrombosis. She did have significant reflux of the bilateral greater saphenous vein on the right there was reflux from the saphenofemoral junction to the proximal calf where there is further decompression through network of varicosities on the left there was reflux from the saphenofemoral junction to the distal calf there is also a varicose network decompressing the calf. She was recommended I think for venous ablations of the greater saphenous vein on the left however she is wanting to defer that it into the new year. She tells me that very shortly after she left here the last time she was not able to put the juxta lite stocking on largely the inner sleeve. She developed swelling and then leakage and skin breakdown again in the left medial lower extremity. She was seen by podiatry on 3 occasions I think they were applying Unna boots. Her son says that at one point the [facility] boot was not put on properly his wife who is a nurse had to adjust this and the swelling is actually somewhat better. She arrives in clinic today with very significant swelling of the proximal two  thirds of her lower extremity erythema superficial skin breakdown on the left medial ankle and calf and weeping edema fluid. Past medical history not much change from last time she has chronic venous insufficiency with very significant reflux and lymphedema. She states that she has trouble bending over to get the inner part of the juxta lite stocking on because of hip problems. Her ABI in this clinic was not repeated but the last time she was here it was 1.13 she was not felt to have an arterial issue 12/9; the patient's wound on the left medial lower leg is again closed. She has chronic hemosiderin and stasis dermatitis left greater than right lipodermatosclerosis left greater than right. On the right leg today we are not wrapping she says she hit this on a screen door however there is no open area here. The right leg has a lot of swelling 12/21; this is a patient we discharged 10 days ago.  She had bilateral juxta lite stockings and was supposed to be getting compression pumps from her son. According the patient her son tested positive for Covid and has been on quarantine and she is unable to get the internal part of the juxta lites on properly. She therefore has weeping edema left greater than right medial and is back in clinic. 12/28. Patient comes in with no open areas on the right leg. She still has very inflamed tenderness on the left medial ankle and lower calf. This could be cellulitis or stasis dermatitis. Also similar changes on the left lateral 1/6; patient returns to clinic. We did not wrap her right leg last time we wrapped the left. I gave her a course of antibiotics because of tenderness on the left medial ankle possible cellulitis versus severe stasis dermatitis. Everything is taken care of on the left where the 3 layer compression was. Unfortunately on the right she has inflamed edema on the right medial ankle and right lateral ankle. Poorly controlled edema. She Kaven with her juxta  lite stocking on but it is not wrapped tightly enough to control the swelling. 1/13; she comes back in today after a week of compression wraps on all her wounds are healed. Her skin is dry and flaky she has stasis dermatitis of predominantly in the right medial greater than left medial lower leg extending into her foot on the right. She has a bag of stockings and related apparel including juxta lite stockings, over the toe compression stockings, a sock Donner. She is for 1 reason or another not able to get these things on at home on her own she comes in today with her daughter from [place]. She got external compression pumps delivered the other day but has not started to use them. The other major problem is she lives alone Objective Constitutional Sitting or standing Blood Pressure is within target range for patient.. Pulse regular and within target range for patient.Marland Kitchen Respirations regular, non-labored and within target range.. Temperature is normal and within the target range for the patient.Marland Kitchen Appears in no distress. Vitals Time Taken: 1:50 PM, Height: 61 in, Weight: 126 lbs, BMI: 23.8, Temperature: 97.9 F, Pulse: 68 bpm, Respiratory Rate: 14 breaths/min, Blood Pressure: 142/73 mmHg. Cardiovascular She does not appear to have. General Notes: Wound exam; left medial ankle which was a problematic area from 2 weeks ago is closed right. Right medial ankle which was the problematic area last week is also closed. Her edema control is reasonably good with our 3 layer compression. Assessment Active Problems ICD-10 Chronic venous hypertension (idiopathic) with ulcer and inflammation of left lower extremity Lymphedema, not elsewhere classified Non-pressure chronic ulcer of left ankle limited to breakdown of skin Plan Discharge From [facility] Services: Discharge from [place] - call if any wounds reopen. Please wear stockings, lotion, and use lymphedema pumps,. Edema Control - Lymphedema / SCD  / Other: Lymphedema Pumps. Use Lymphedema pumps on leg(s) 2-3 times a day for 45-60 minutes. If wearing any wraps or hose, do not remove them. Continue exercising as instructed. - Use the pumps twice a day for an hour each time. Avoid standing for long periods of time. Exercise regularly Moisturize legs daily. - both legs every night. Compression stocking or Garment 30-40 mm/Hg pressure to: - juxtalite HD to bilateral legs. Apply in the morning and remove at night. 1. We have discharged the patient. She has external compression pumps and we will decide today what type of stocking she wants to  pursue 2. She is vehemently opposed to any help in the home in spite of her daughters attempt to persuade her otherwise or even have her come up to [place] to spend time with her. We spent a lot of time talking about this 3. She is to moisturize her skin at least daily. Electronic Signature(s) Signed: 12/10/2020 5:01:02 PM By: Linton Ham MD Entered By: Linton Ham on 12/10/2020 14:42:49 -------------------------------------------------------------------------------- SuperBill Details Patient Name: Date of Service: Jackie [name], [name] 12/10/2020 Medical Record Number: [id number] Patient Account Number: [id number] Date of Birth/Sex: Treating RN: 01/20/1932 (87 y.o. Jackie [name] Primary Care Provider: [name] Other Clinician: Referring Provider: Treating Provider/Extender: [name] in Treatment: 6 Diagnosis Coding ICD-10 Codes Code Description [phone] Chronic venous hypertension (idiopathic) with ulcer and inflammation of left lower extremity I89.0 Lymphedema, not elsewhere classified L97.321 Non-pressure chronic ulcer of left ankle limited to breakdown of skin Facility Procedures CPT4 Code: [id number] Description: 99213 - WOUND CARE VISIT-LEV 3 EST PT Modifier: Quantity: 1 Physician Procedures : CPT4 Code Description Modifier [id number] 99213 - WC PHYS  LEVEL 3 - EST PT ICD-10 Diagnosis Description I87.332 Chronic venous hypertension (idiopathic) with ulcer and inflammation of left lower extremity I89.0 Lymphedema, not elsewhere classified L97.321  Non-pressure chronic ulcer of left ankle limited to breakdown of skin Quantity: 1 Electronic Signature(s) Signed: 12/10/2020 5:01:02 PM By: Linton Ham MD Entered By: Linton Ham on 12/10/2020 14:43:08

## 2020-12-15 ENCOUNTER — Other Ambulatory Visit (HOSPITAL_COMMUNITY): Payer: Self-pay | Admitting: *Deleted

## 2020-12-18 ENCOUNTER — Encounter (HOSPITAL_COMMUNITY): Payer: Medicare Other

## 2020-12-23 ENCOUNTER — Encounter (HOSPITAL_BASED_OUTPATIENT_CLINIC_OR_DEPARTMENT_OTHER): Payer: Medicare Other | Admitting: Physician Assistant

## 2020-12-23 ENCOUNTER — Other Ambulatory Visit: Payer: Self-pay

## 2020-12-23 DIAGNOSIS — I87332 Chronic venous hypertension (idiopathic) with ulcer and inflammation of left lower extremity: Secondary | ICD-10-CM | POA: Diagnosis not present

## 2020-12-23 NOTE — Progress Notes (Signed)
Jackie [name], Jackie [name]. ([id number]) Visit Report for 12/23/2020 Arrival Information Details Patient Name: Date of Service: Jackie Lamb CE M. 12/23/2020 2:30 PM Medical Record Number: [id number] Patient Account Number: [id number] Date of Birth/Sex: Treating RN: 1932/07/15 (87 y.o. Jackie [name], [name] Primary Care Aireonna Bauer: [name] Other Clinician: Referring Rhondalyn Clingan: Treating Jaclynne Baldo/Extender: Terence Lux in Treatment: 7 Visit Information History Since Last Visit Added or deleted any medications: No Patient Arrived: Jackie [name] Any new allergies or adverse reactions: No Arrival Time: 15:10 Had a fall or experienced change in No Accompanied By: self activities of daily living that may affect Transfer Assistance: None risk of falls: Patient Identification Verified: Yes Signs or symptoms of abuse/neglect since last visito No Secondary Verification Process Completed: Yes Hospitalized since last visit: No Patient Requires Transmission-Based Precautions: No Implantable device outside of the clinic excluding No Patient Has Alerts: Yes cellular tissue based products placed in the center Patient Alerts: ABI: L 1.13 07/2020 since last visit: Has Compression in Place as Prescribed: No Pain Present Now: No Notes patient wearing diabetic socks to bilateral legs. C/o pain with lymphedema pumps when in use and when applying the stockings over heel and ankle. Electronic Signature(s) Signed: 12/23/2020 6:11:14 PM By: Deon Pilling Entered By: Deon Pilling on 12/23/2020 15:17:11 -------------------------------------------------------------------------------- Compression Therapy Details Patient Name: Date of Service: Jackie [name], [place]. 12/23/2020 2:30 PM Medical Record Number: [id number] Patient Account Number: [id number] Date of Birth/Sex: Treating RN: 1932-10-19 (87 y.o. Jackie [name] Primary Care Tibor Lemmons: [name] Other Clinician: Referring Nikolai Wilczak: Treating  Adriyana Greenbaum/Extender: Terence Lux in Treatment: 7 Compression Therapy Performed for Wound Assessment: NonWound Condition Lymphedema - Bilateral Leg Performed By: Clinician Rhae Hammock, RN Compression Type: Three Layer Post Procedure Diagnosis Same as Pre-procedure Electronic Signature(s) Signed: 12/23/2020 6:09:51 PM By: Baruch Gouty RN, BSN Entered By: Baruch Gouty on 12/23/2020 16:13:15 -------------------------------------------------------------------------------- Encounter Discharge Information Details Patient Name: Date of Service: Jackie [name], Jackie [name]. 12/23/2020 2:30 PM Medical Record Number: [id number] Patient Account Number: [id number] Date of Birth/Sex: Treating RN: [birth year] (87 y.o. Jackie [name] Primary Care Cherese Lozano: [name] Other Clinician: Referring Kiet Geer: Treating Lucca Ballo/Extender: Terence Lux in Treatment: 7 Encounter Discharge Information Items Discharge Condition: Stable Ambulatory Status: Cane Discharge Destination: Home Transportation: Private Auto Accompanied By: self Schedule Follow-up Appointment: Yes Clinical Summary of Care: Patient Declined Electronic Signature(s) Signed: 12/23/2020 6:09:51 PM By: Baruch Gouty RN, BSN Entered By: Baruch Gouty on 12/23/2020 17:03:28 -------------------------------------------------------------------------------- Lower Extremity Assessment Details Patient Name: Date of Service: Jackie [name], Jackie [name]. 12/23/2020 2:30 PM Medical Record Number: [id number] Patient Account Number: [id number] Date of Birth/Sex: Treating RN: [birth year] (87 y.o. Jackie [name] Primary Care Adrine Hayworth: [name] Other Clinician: Referring Toriann Spadoni: Treating Gena Laski/Extender: Terence Lux in Treatment: 7 Edema Assessment Assessed: [Left: Yes] [Right: Yes] Edema: [Left: Yes] [Right: Yes] Calf Left: Right: Point of Measurement: 38  cm From Medial Instep 36.5 cm 36.5 cm Ankle Left: Right: Point of Measurement: 9 cm From Medial Instep 23 cm 22 cm Vascular Assessment Pulses: Dorsalis Pedis Palpable: [Left:Yes] [Right:Yes] Electronic Signature(s) Signed: 12/23/2020 6:11:14 PM By: Deon Pilling Entered By: Deon Pilling on 12/23/2020 15:18:35 -------------------------------------------------------------------------------- [place]le Details Patient Name: Date of Service: Jackie [name], Jackie [name]. 12/23/2020 2:30 PM Medical Record Number: [id number] Patient Account Number: [id number] Date of Birth/Sex: Treating RN: May 09, 1932 (87 y.o. Jackie [name] Primary Care Jeany Seville: [name] Other  Clinician: Referring Graylen Noboa: Treating Tomasita Beevers/Extender: Terence Lux in Treatment: 7 Active Inactive Soft Tissue Infection Nursing Diagnoses: Impaired tissue integrity Potential for infection: soft tissue Goals: Patient's soft tissue infection will resolve Date Initiated: 12/23/2020 Target Resolution Date: 01/20/2021 Goal Status: Active Interventions: Assess signs and symptoms of infection every visit Provide education on infection Notes: Venous Leg Ulcer Nursing Diagnoses: Actual venous Insuffiency (use after diagnosis is confirmed) Knowledge deficit related to disease process and management Potential for venous Insuffiency (use before diagnosis confirmed) Goals: Patient will maintain optimal edema control Date Initiated: 12/23/2020 Target Resolution Date: 01/20/2021 Goal Status: Active Interventions: Assess peripheral edema status every visit. Compression as ordered Provide education on venous insufficiency Treatment Activities: T ordered outside of clinic : 12/23/2020 est Therapeutic compression applied : 12/23/2020 Notes: Electronic Signature(s) Signed: 12/23/2020 6:09:51 PM By: Baruch Gouty RN, BSN Entered By: Baruch Gouty on 12/23/2020  16:12:11 -------------------------------------------------------------------------------- Non-Wound Condition Assessment Details Patient Name: Date of Service: Jackie [name], Jackie CE M. 12/23/2020 2:30 PM Medical Record Number: [id number] Patient Account Number: [id number] Date of Birth/Sex: Treating RN: 1932/05/28 (87 y.o. Jackie [name] Primary Care Calise Dunckel: [name] Other Clinician: Referring Marium Ragan: Treating Renan Danese/Extender: Terence Lux in Treatment: 7 Non-Wound Condition: Condition: Lymphedema Location: Leg Side: Bilateral Notes: leg ankles weeping Notes bilateral legs weeping, redden, with flaky skin noted. Electronic Signature(s) Signed: 12/23/2020 6:11:14 PM By: Deon Pilling Entered By: Deon Pilling on 12/23/2020 15:19:05 -------------------------------------------------------------------------------- Pain Assessment Details Patient Name: Date of Service: Jackie [name], [place]. 12/23/2020 2:30 PM Medical Record Number: [id number] Patient Account Number: [id number] Date of Birth/Sex: Treating RN: Sep 11, 1932 (87 y.o. Jackie [name] Primary Care Aliannah Holstrom: [name] Other Clinician: Referring Teosha Casso: Treating Everette Dimauro/Extender: Terence Lux in Treatment: 7 Active Problems Location of Pain Severity and Description of Pain Patient Has Paino No Site Locations Rate the pain. Current Pain Level: 0 Pain Management and Medication Current Pain Management: Medication: No Cold Application: No Rest: No Massage: No Activity: No T.E.N.S.: No Heat Application: No Leg drop or elevation: No Is the Current Pain Management Adequate: Adequate How does your wound impact your activities of daily livingo Sleep: No Bathing: No Appetite: No Relationship With Others: No Bladder Continence: No Emotions: No Bowel Continence: No Work: No Toileting: No Drive: No Dressing: No Hobbies: No Notes Currently no pain. Per  patient at night more pain. Electronic Signature(s) Signed: 12/23/2020 6:11:14 PM By: Deon Pilling Entered By: Deon Pilling on 12/23/2020 15:18:14 -------------------------------------------------------------------------------- Patient/Caregiver Education Details Patient Name: Date of Service: Jackie Lamb CE M. 1/26/2022andnbsp2:30 PM Medical Record Number: [id number] Patient Account Number: [id number] Date of Birth/Gender: Treating RN: 1932/10/26 (87 y.o. Jackie [name] Primary Care Physician: [name] Other Clinician: Referring Physician: Treating Physician/Extender: Terence Lux in Treatment: 7 Education Assessment Education Provided To: Patient Education Topics Provided Infection: Methods: Explain/Verbal Responses: Reinforcements needed, State content correctly Venous: Methods: Explain/Verbal Responses: Reinforcements needed, State content correctly Electronic Signature(s) Signed: 12/23/2020 6:09:51 PM By: Baruch Gouty RN, BSN Entered By: Baruch Gouty on 12/23/2020 16:12:31 -------------------------------------------------------------------------------- Vitals Details Patient Name: Date of Service: Jackie [name], [place]. 12/23/2020 2:30 PM Medical Record Number: [id number] Patient Account Number: [id number] Date of Birth/Sex: Treating RN: Jun 03, 1932 (87 y.o. Jackie [name] Primary Care Rielyn Krupinski: [name] Other Clinician: Referring Verlon Carcione: Treating Atwood Adcock/Extender: Terence Lux in Treatment: 7 Vital Signs Time Taken: 15:10 Temperature (F): 97.8 Height (in): 61 Pulse (  bpm): 74 Weight (lbs): 126 Respiratory Rate (breaths/min): 18 Body Mass Index (BMI): 23.8 Blood Pressure (mmHg): 171/70 Reference Range: 80 - 120 mg / dl Electronic Signature(s) Signed: 12/23/2020 6:11:14 PM By: Deon Pilling Entered By: Deon Pilling on 12/23/2020 15:17:49

## 2020-12-23 NOTE — Progress Notes (Addendum)
Jackie [name] [address] ([id number]) Visit Report for 12/23/2020 Chief Complaint Document Details Patient Name: Date of Service: Jackie [name] CE M. 12/23/2020 2:30 PM Medical Record Number: [id number] Patient Account Number: [id number] Date of Birth/Sex: Treating RN: [birth year] (87 y.o. Jackie [name] Primary Care Provider: Reynold [name] Other Clinician: Referring Provider: Treating Provider/Extender: Jackie [name] in Treatment: 7 Information Obtained from: Patient Chief Complaint 07/17/2020; patient is here for review of a wound on her left medial ankle and lower leg Electronic Signature(s) Signed: 12/23/2020 3:40:59 PM By: Jackie Keeler PA-C Entered By: Jackie [name] on 12/23/2020 15:40:59 -------------------------------------------------------------------------------- HPI Details Patient Name: Date of Service: Jackie [name], Jackie [name]. 12/23/2020 2:30 PM Medical Record Number: [id number] Patient Account Number: [id number] Date of Birth/Sex: Treating RN: 06-May-1932 (87 y.o. Jackie [name] Primary Care Provider: Reynold [name] Other Clinician: Referring Provider: Treating Provider/Extender: Jackie [name] in Treatment: 7 History of Present Illness HPI Description: ADMISSION 07/17/2020 This is an 87 year old woman who is referred by her nurse practitioner at [facility] who has been doing treatment for the wound on the right medial lower leg and ankle. Apparently 1 point a substantial wound however it is largely epithelialized now. Patient states its been there for about 5 or 6 weeks. On the background she has changes in the skin in this area I think the go back several years to when she had a left hip fracture. Nevertheless she does not have a wound history that I could elicit. As far as I know that they had been using TCA, Mepitel under an Unna boot they have been changing this weekly at [facility] [employer]. She also  received a course of doxycycline. Past medical history very little that I can find in [place] link. She has a history of venous insufficiency bilaterally and a left hip fracture hypertension and a history of thyroidectomy for thyroid cancer. ABI in our clinic was 1.13 on the left 8/27; the patient is substantial initial wound by her description on the left medial lower leg and ankle is completely epithelialized. She has chronic venous insufficiency, dermatosclerosis and probably lymphedema. She does not have stockings and states that she would have trouble getting over the toe stockings on in any fashion because of hip problems except 9/3; this is a patient with chronic venous insufficiency with a particularly difficult area on her left medial lower leg and ankle. My assessment of this is that she has chronic venous insufficiency and chronic venous hypertension. And at least on the left medial ankle stasis dermatitis. The area still looks angry and inflamed. Nevertheless it is epithelialized. I do not believe there is active infection. We have ordered her bilateral juxta lite stockings. These should be easier for her to put on but need to be adjusted to 30/40 compression. If this is not successful in maintaining this area I think she is going to need formal reflux studies to see if she might be a candidate for ablation of the greater saphenous vein 08/05/2020 upon evaluation today patient appears to be doing poorly in regard to her lower extremity. Unfortunately this has reopened after being healed on Friday. She tells me she was wearing her compression wrap but that even when she woke up in the morning on Saturday morning she was leaking. Fortunately there is no signs of active infection at this time systemically though there is some question of a thing locally. She does have juxta light  compression bilaterally. 08/12/2020 on evaluation today patient appears to be doing well with regard to her leg  ulcer which actually is closed again at this point. With that being said she is still having some issues currently with very thin skin that has come back over and again with her swelling if we do not wrap around afraid this is can open right back up. Nonetheless I do believe that she should proceed with the vascular testing next Thursday as previously ordered and recommended. Fortunately there is no signs of active infection at this time. 9/23; this is a patient I discharged a few weeks ago. Apparently she had a reopening on the left leg medially. This is closed now. We have ordered venous reflux studies but they are not until 10/5 at [place] 10/29/2020 Patient is a now 87 year old female. She is somewhat frail however still lives at home largely on her own. We had her in the clinic in August and September 2021 with a wound on the left medial lower extremity in the setting of chronic venous insufficiency and lymphedema. This closed reasonably easily and we discharged her to juxta lite stockings. After her discharge she had her venous reflux studies at interventional radiology. She was seen in consultation by Dr. Earleen [name]. She did not have evidence of thrombosis. She did have significant reflux of the bilateral greater saphenous vein on the right there was reflux from the saphenofemoral junction to the proximal calf where there is further decompression through network of varicosities on the left there was reflux from the saphenofemoral junction to the distal calf there is also a varicose network decompressing the calf. She was recommended I think for venous ablations of the greater saphenous vein on the left however she is wanting to defer that it into the new year. She tells me that very shortly after she left here the last time she was not able to put the juxta lite stocking on largely the inner sleeve. She developed swelling and then leakage and skin breakdown again in the left  medial lower extremity. She was seen by podiatry on 3 occasions I think they were applying Unna boots. Her son says that at one point the [facility] boot was not put on properly his wife who is a nurse had to adjust this and the swelling is actually somewhat better. She arrives in clinic today with very significant swelling of the proximal two thirds of her lower extremity erythema superficial skin breakdown on the left medial ankle and calf and weeping edema fluid. Past medical history not much change from last time she has chronic venous insufficiency with very significant reflux and lymphedema. She states that she has trouble bending over to get the inner part of the juxta lite stocking on because of hip problems. Her ABI in this clinic was not repeated but the last time she was here it was 1.13 she was not felt to have an arterial issue 12/9; the patient's wound on the left medial lower leg is again closed. She has chronic hemosiderin and stasis dermatitis left greater than right lipodermatosclerosis left greater than right. On the right leg today we are not wrapping she says she hit this on a screen door however there is no open area here. The right leg has a lot of swelling 12/21; this is a patient we discharged 10 days ago. She had bilateral juxta lite stockings and was supposed to be getting compression pumps from her son. According the patient her son tested  positive for Covid and has been on quarantine and she is unable to get the internal part of the juxta lites on properly. She therefore has weeping edema left greater than right medial and is back in clinic. 12/28. Patient comes in with no open areas on the right leg. She still has very inflamed tenderness on the left medial ankle and lower calf. This could be cellulitis or stasis dermatitis. Also similar changes on the left lateral 1/6; patient returns to clinic. We did not wrap her right leg last time we wrapped the left. I gave her a course  of antibiotics because of tenderness on the left medial ankle possible cellulitis versus severe stasis dermatitis. Everything is taken care of on the left where the 3 layer compression was. Unfortunately on the right she has inflamed edema on the right medial ankle and right lateral ankle. Poorly controlled edema. She Kaven with her juxta lite stocking on but it is not wrapped tightly enough to control the swelling. 1/13; she comes back in today after a week of compression wraps on all her wounds are healed. Her skin is dry and flaky she has stasis dermatitis of predominantly in the right medial greater than left medial lower leg extending into her foot on the right. She has a bag of stockings and related apparel including juxta lite stockings, over the toe compression stockings, a sock Donner. She is for 1 reason or another not able to get these things on at home on her own she comes in today with her daughter from [place]. She got external compression pumps delivered the other day but has not started to use them. The other major problem is she lives alone 12/23/2020 upon evaluation today patient unfortunately presents for readmission due to issues that she is having with weeping of her lower extremities along with increased discomfort and pain. She also has increased redness. I am concerned about infection here among other things. She has not been using the compression as she tells me that this is not easy for her to do. She is also not been using her lymphedema pumps enough to even know "whether they would work or notLoss adjuster, chartered) Signed: 12/23/2020 4:18:30 PM By: Jackie Keeler PA-C Entered By: Jackie [name] on 12/23/2020 16:18:29 -------------------------------------------------------------------------------- Physical Exam Details Patient Name: Date of Service: Jackie [name], [place]. 12/23/2020 2:30 PM Medical Record Number: [id number] Patient Account Number: [id number] Date of  Birth/Sex: Treating RN: Oct 29, 1932 (87 y.o. Jackie [name] Primary Care Provider: Reynold [name] Other Clinician: Referring Provider: Treating Provider/Extender: Jackie [name] in Treatment: 7 Constitutional patient is hypertensive.Marland Kitchen Respiratory normal breathing without difficulty. Cardiovascular 1+ dorsalis pedis/posterior tibialis pulses. 1+ pitting edema of the bilateral lower extremities. Psychiatric this patient is able to make decisions and demonstrates good insight into disease process. Alert and Oriented x 3. pleasant and cooperative. Notes Upon inspection patient's wound bed actually showed signs of mainly just areas of weeping although is not too weepy here in the office which is good news. Nonetheless she unfortunately is continuing to have issues with lower extremity edema which is the big concern here I believe that is causing her skin to crack and then subsequently the cellulitis to set in. I do feel like she is experiencing cellulitis at this point Electronic Signature(s) Signed: 12/23/2020 4:19:10 PM By: Jackie Keeler PA-C Entered By: Jackie [name] on 12/23/2020 16:19:09 -------------------------------------------------------------------------------- Physician Orders Details Patient Name: Date of Service: Jackie [name],  [place] CE M. 12/23/2020 2:30 PM Medical Record Number: [id number] Patient Account Number: [id number] Date of Birth/Sex: Treating RN: Jun 03, 1932 (87 y.o. Jackie [name] Primary Care Provider: Reynold [name] Other Clinician: Referring Provider: Treating Provider/Extender: Jackie [name] in Treatment: 7 Verbal / Phone Orders: No Diagnosis Coding ICD-10 Coding Code Description [id number] Chronic venous hypertension (idiopathic) with ulcer and inflammation of left lower extremity I89.0 Lymphedema, not elsewhere classified L97.321 Non-pressure chronic ulcer of left ankle limited to breakdown of skin Follow-up  Appointments Return Appointment in 1 week. Bathing/ Shower/ Hygiene May shower with protection but do not get wound dressing(s) wet. - May use cast protector Edema Control - Lymphedema / SCD / Other Bilateral Lower Extremities Lymphedema Pumps. Use Lymphedema pumps on leg(s) 2-3 times a day for 45-60 minutes. If wearing any wraps or hose, do not remove them. Continue exercising as instructed. Elevate legs to the level of the heart or above for 30 minutes daily and/or when sitting, a frequency of: Avoid standing for long periods of time. Exercise regularly Other Edema Control Orders/Instructions: - 3 layer compression wrap right and left lower legs, Non Wound Condition Bilateral Lower Extremities pply the following to affected area as directed: - TCA cream liberally mixed with lotion to BOTH LEGS, ABD pads to any weeping areas then apply A 3 layer compression wrap. Patient Medications llergies: codeine, Zithromax A Notifications Medication Indication Start End 12/23/2020 doxycycline hyclate DOSE 1 - oral 100 mg capsule - 1 capsule oral taken 2 times per day for 14 days Electronic Signature(s) Signed: 12/23/2020 4:21:27 PM By: Jackie Keeler PA-C Entered By: Jackie [name] on 12/23/2020 16:21:27 -------------------------------------------------------------------------------- Problem List Details Patient Name: Date of Service: Jackie [name], [place]. 12/23/2020 2:30 PM Medical Record Number: [id number] Patient Account Number: [id number] Date of Birth/Sex: Treating RN: 26-Jan-1932 (87 y.o. Jackie [name] Primary Care Provider: Reynold [name] Other Clinician: Referring Provider: Treating Provider/Extender: Jackie [name] in Treatment: 7 Active Problems ICD-10 Encounter Code Description Active Date MDM Diagnosis I87.332 Chronic venous hypertension (idiopathic) with ulcer and inflammation of left 10/29/2020 No Yes lower extremity I89.0 Lymphedema, not elsewhere  classified 10/29/2020 No Yes L97.321 Non-pressure chronic ulcer of left ankle limited to breakdown of skin 10/29/2020 No Yes Inactive Problems Resolved Problems Electronic Signature(s) Signed: 12/23/2020 3:40:48 PM By: Jackie Keeler PA-C Entered By: Jackie [name] on 12/23/2020 15:40:48 -------------------------------------------------------------------------------- Progress Note Details Patient Name: Date of Service: Jackie [name], [place]. 12/23/2020 2:30 PM Medical Record Number: [id number] Patient Account Number: [id number] Date of Birth/Sex: Treating RN: September 04, 1932 (87 y.o. Jackie [name] Primary Care Provider: Reynold [name] Other Clinician: Referring Provider: Treating Provider/Extender: Jackie [name] in Treatment: 7 Subjective Chief Complaint Information obtained from Patient 07/17/2020; patient is here for review of a wound on her left medial ankle and lower leg History of Present Illness (HPI) ADMISSION 07/17/2020 This is an 87 year old woman who is referred by her nurse practitioner at [facility] who has been doing treatment for the wound on the right medial lower leg and ankle. Apparently 1 point a substantial wound however it is largely epithelialized now. Patient states its been there for about 5 or 6 weeks. On the background she has changes in the skin in this area I think the go back several years to when she had a left hip fracture. Nevertheless she does not have a wound history that I could elicit. As far as I  know that they had been using TCA, Mepitel under an Haematologist they have been changing this weekly at [facility]. She also received a course of doxycycline. Past medical history very little that I can find in [place] link. She has a history of venous insufficiency bilaterally and a left hip fracture hypertension and a history of thyroidectomy for thyroid cancer. ABI in our clinic was 1.13 on the  left 8/27; the patient is substantial initial wound by her description on the left medial lower leg and ankle is completely epithelialized. She has chronic venous insufficiency, dermatosclerosis and probably lymphedema. She does not have stockings and states that she would have trouble getting over the toe stockings on in any fashion because of hip problems except 9/3; this is a patient with chronic venous insufficiency with a particularly difficult area on her left medial lower leg and ankle. My assessment of this is that she has chronic venous insufficiency and chronic venous hypertension. And at least on the left medial ankle stasis dermatitis. The area still looks angry and inflamed. Nevertheless it is epithelialized. I do not believe there is active infection. We have ordered her bilateral juxta lite stockings. These should be easier for her to put on but need to be adjusted to 30/40 compression. If this is not successful in maintaining this area I think she is going to need formal reflux studies to see if she might be a candidate for ablation of the greater saphenous vein 08/05/2020 upon evaluation today patient appears to be doing poorly in regard to her lower extremity. Unfortunately this has reopened after being healed on Friday. She tells me she was wearing her compression wrap but that even when she woke up in the morning on Saturday morning she was leaking. Fortunately there is no signs of active infection at this time systemically though there is some question of a thing locally. She does have juxta light compression bilaterally. 08/12/2020 on evaluation today patient appears to be doing well with regard to her leg ulcer which actually is closed again at this point. With that being said she is still having some issues currently with very thin skin that has come back over and again with her swelling if we do not wrap around afraid this is can open right back up. Nonetheless I do believe that  she should proceed with the vascular testing next Thursday as previously ordered and recommended. Fortunately there is no signs of active infection at this time. 9/23; this is a patient I discharged a few weeks ago. Apparently she had a reopening on the left leg medially. This is closed now. We have ordered venous reflux studies but they are not until 10/5 at [place] 10/29/2020 Patient is a now 87 year old female. She is somewhat frail however still lives at home largely on her own. We had her in the clinic in August and September 2021 with a wound on the left medial lower extremity in the setting of chronic venous insufficiency and lymphedema. This closed reasonably easily and we discharged her to juxta lite stockings. After her discharge she had her venous reflux studies at interventional radiology. She was seen in consultation by Dr. Earleen [name]. She did not have evidence of thrombosis. She did have significant reflux of the bilateral greater saphenous vein on the right there was reflux from the saphenofemoral junction to the proximal calf where there is further decompression through network of varicosities on the left there was reflux from the saphenofemoral junction  to the distal calf there is also a varicose network decompressing the calf. She was recommended I think for venous ablations of the greater saphenous vein on the left however she is wanting to defer that it into the new year. She tells me that very shortly after she left here the last time she was not able to put the juxta lite stocking on largely the inner sleeve. She developed swelling and then leakage and skin breakdown again in the left medial lower extremity. She was seen by podiatry on 3 occasions I think they were applying Unna boots. Her son says that at one point the [facility] boot was not put on properly his wife who is a nurse had to adjust this and the swelling is actually somewhat better. She arrives in  clinic today with very significant swelling of the proximal two thirds of her lower extremity erythema superficial skin breakdown on the left medial ankle and calf and weeping edema fluid. Past medical history not much change from last time she has chronic venous insufficiency with very significant reflux and lymphedema. She states that she has trouble bending over to get the inner part of the juxta lite stocking on because of hip problems. Her ABI in this clinic was not repeated but the last time she was here it was 1.13 she was not felt to have an arterial issue 12/9; the patient's wound on the left medial lower leg is again closed. She has chronic hemosiderin and stasis dermatitis left greater than right lipodermatosclerosis left greater than right. On the right leg today we are not wrapping she says she hit this on a screen door however there is no open area here. The right leg has a lot of swelling 12/21; this is a patient we discharged 10 days ago. She had bilateral juxta lite stockings and was supposed to be getting compression pumps from her son. According the patient her son tested positive for Covid and has been on quarantine and she is unable to get the internal part of the juxta lites on properly. She therefore has weeping edema left greater than right medial and is back in clinic. 12/28. Patient comes in with no open areas on the right leg. She still has very inflamed tenderness on the left medial ankle and lower calf. This could be cellulitis or stasis dermatitis. Also similar changes on the left lateral 1/6; patient returns to clinic. We did not wrap her right leg last time we wrapped the left. I gave her a course of antibiotics because of tenderness on the left medial ankle possible cellulitis versus severe stasis dermatitis. Everything is taken care of on the left where the 3 layer compression was. Unfortunately on the right she has inflamed edema on the right medial ankle and right  lateral ankle. Poorly controlled edema. She Kaven with her juxta lite stocking on but it is not wrapped tightly enough to control the swelling. 1/13; she comes back in today after a week of compression wraps on all her wounds are healed. Her skin is dry and flaky she has stasis dermatitis of predominantly in the right medial greater than left medial lower leg extending into her foot on the right. She has a bag of stockings and related apparel including juxta lite stockings, over the toe compression stockings, a sock Donner. She is for 1 reason or another not able to get these things on at home on her own she comes in today with her daughter from [place]. She got  external compression pumps delivered the other day but has not started to use them. The other major problem is she lives alone 12/23/2020 upon evaluation today patient unfortunately presents for readmission due to issues that she is having with weeping of her lower extremities along with increased discomfort and pain. She also has increased redness. I am concerned about infection here among other things. She has not been using the compression as she tells me that this is not easy for her to do. She is also not been using her lymphedema pumps enough to even know "whether they would work or not" Objective Constitutional patient is hypertensive.. Vitals Time Taken: 3:10 PM, Height: 61 in, Weight: 126 lbs, BMI: 23.8, Temperature: 97.8 F, Pulse: 74 bpm, Respiratory Rate: 18 breaths/min, Blood Pressure: 171/70 mmHg. Respiratory normal breathing without difficulty. Cardiovascular 1+ dorsalis pedis/posterior tibialis pulses. 1+ pitting edema of the bilateral lower extremities. Psychiatric this patient is able to make decisions and demonstrates good insight into disease process. Alert and Oriented x 3. pleasant and cooperative. General Notes: Upon inspection patient's wound bed actually showed signs of mainly just areas of weeping although is  not too weepy here in the office which is good news. Nonetheless she unfortunately is continuing to have issues with lower extremity edema which is the big concern here I believe that is causing her skin to crack and then subsequently the cellulitis to set in. I do feel like she is experiencing cellulitis at this point Other Condition(s) Patient presents with Lymphedema located on the Bilateral Leg. General Notes: bilateral legs weeping, redden, with flaky skin noted. Assessment Active Problems ICD-10 Chronic venous hypertension (idiopathic) with ulcer and inflammation of left lower extremity Lymphedema, not elsewhere classified Non-pressure chronic ulcer of left ankle limited to breakdown of skin Procedures There was a Three Layer Compression Therapy Procedure by Rhae Hammock, RN. Post procedure Diagnosis Wound #: Same as Pre-Procedure Plan Follow-up Appointments: Return Appointment in 1 week. Bathing/ Shower/ Hygiene: May shower with protection but do not get wound dressing(s) wet. - May use cast protector Edema Control - Lymphedema / SCD / Other: Lymphedema Pumps. Use Lymphedema pumps on leg(s) 2-3 times a day for 45-60 minutes. If wearing any wraps or hose, do not remove them. Continue exercising as instructed. Elevate legs to the level of the heart or above for 30 minutes daily and/or when sitting, a frequency of: Avoid standing for long periods of time. Exercise regularly Other Edema Control Orders/Instructions: - 3 layer compression wrap right and left lower legs, Non Wound Condition: Apply the following to affected area as directed: - TCA cream liberally mixed with lotion to BOTH LEGS, ABD pads to any weeping areas then apply 3 layer compression wrap. The following medication(s) was prescribed: doxycycline hyclate oral 100 mg capsule 1 1 capsule oral taken 2 times per day for 14 days starting 12/23/2020 1. Would go ahead and send in a prescription for doxycycline for her  she tells me that when Dr. Dellia Nims gave her this last it did help that is good news. 2. With regard to her legs I am to go ahead and recommend that we perform a 3 layer compression wrap to try to help with edema control. I do believe this will help as well as far as keeping the edema down and thereby hopefully allowing this to heal without worsening. 3. I am going to suggest that she needs to try to elevate her legs much as possible she should be using the lymphedema pumps to  try to help with edema control. Right now she tells me "it hurts too bad". We will see patient back for reevaluation in 1 week here in the clinic. If anything worsens or changes patient will contact our office for additional recommendations. Electronic Signature(s) Signed: 12/23/2020 4:21:46 PM By: Jackie Keeler PA-C Entered By: Jackie [name] on 12/23/2020 16:21:46 -------------------------------------------------------------------------------- SuperBill Details Patient Name: Date of Service: Jackie [name], [name] 12/23/2020 Medical Record Number: [id number] Patient Account Number: [id number] Date of Birth/Sex: Treating RN: 01-04-1932 (87 y.o. Jackie [name] Primary Care Provider: Reynold [name] Other Clinician: Referring Provider: Treating Provider/Extender: Jackie [name] in Treatment: 7 Diagnosis Coding ICD-10 Codes Code Description [phone] Chronic venous hypertension (idiopathic) with ulcer and inflammation of left lower extremity I89.0 Lymphedema, not elsewhere classified L97.321 Non-pressure chronic ulcer of left ankle limited to breakdown of skin Facility Procedures CPT4: Code [id number] Description: 81 BILATERAL: Application of multi-layer venous compression system; leg (below knee), including ankle and t. Modifier: Quantity: 1 Physician Procedures : CPT4 Code Description Modifier [id number] - WC PHYS LEVEL 4 - EST PT ICD-10 Diagnosis Description I87.332 Chronic venous  hypertension (idiopathic) with ulcer and inflammation of left lower extremity I89.0 Lymphedema, not elsewhere classified Quantity: 1 Electronic Signature(s) Signed: 12/23/2020 4:23:05 PM By: Jackie Keeler PA-C Entered By: Jackie [name] on 12/23/2020 16:23:04

## 2020-12-24 ENCOUNTER — Ambulatory Visit
Admission: RE | Admit: 2020-12-24 | Discharge: 2020-12-24 | Disposition: A | Discharge status: Home / Self Care | Payer: Medicare Other | Source: Ambulatory Visit | Attending: Family Medicine | Admitting: Family Medicine

## 2020-12-24 ENCOUNTER — Other Ambulatory Visit: Payer: Self-pay | Admitting: Family Medicine

## 2020-12-24 ENCOUNTER — Other Ambulatory Visit: Payer: Self-pay

## 2020-12-24 DIAGNOSIS — R0789 Other chest pain: Secondary | ICD-10-CM

## 2020-12-24 DIAGNOSIS — M5440 Lumbago with sciatica, unspecified side: Secondary | ICD-10-CM

## 2020-12-24 DIAGNOSIS — M546 Pain in thoracic spine: Secondary | ICD-10-CM

## 2020-12-24 DIAGNOSIS — R0781 Pleurodynia: Secondary | ICD-10-CM

## 2020-12-30 ENCOUNTER — Encounter (HOSPITAL_COMMUNITY): Payer: Medicare Other

## 2020-12-31 ENCOUNTER — Other Ambulatory Visit: Payer: Self-pay

## 2020-12-31 ENCOUNTER — Encounter (HOSPITAL_BASED_OUTPATIENT_CLINIC_OR_DEPARTMENT_OTHER): Payer: Medicare Other | Attending: Internal Medicine | Admitting: Internal Medicine

## 2020-12-31 DIAGNOSIS — I890 Lymphedema, not elsewhere classified: Secondary | ICD-10-CM | POA: Diagnosis not present

## 2020-12-31 DIAGNOSIS — L97321 Non-pressure chronic ulcer of left ankle limited to breakdown of skin: Secondary | ICD-10-CM | POA: Diagnosis not present

## 2020-12-31 NOTE — Progress Notes (Signed)
Jackie [name], Jackie [name] [place]. ([id number]) Visit Report for 12/31/2020 HPI Details Patient Name: Date of Service: Jackie [name] CE M. 12/31/2020 12:30 PM Medical Record Number: [id number] Patient Account Number: [id number] Date of Birth/Sex: Treating RN: May 04, 1932 (87 y.o. Jackie [name] Primary Care Provider: Reynold [name] Other Clinician: Referring Provider: Treating Provider/Extender: Jackie [name] in Treatment: 9 History of Present Illness HPI Description: ADMISSION 07/17/2020 This is an 87 year old woman who is referred by her nurse practitioner at [name] who has been doing treatment for the wound on the right medial lower leg and ankle. Apparently 1 point a substantial wound however it is largely epithelialized now. Patient states its been there for about 5 or 6 weeks. On the background she has changes in the skin in this area I think the go back several years to when she had a left hip fracture. Nevertheless she does not have a wound history that I could elicit. As far as I know that they had been using TCA, Mepitel under an Unna boot they have been changing this weekly at [employer]. She also received a course of doxycycline. Past medical history very little that I can find in [place] link. She has a history of venous insufficiency bilaterally and a left hip fracture hypertension and a history of thyroidectomy for thyroid cancer. ABI in our clinic was 1.13 on the left 8/27; the patient is substantial initial wound by her description on the left medial lower leg and ankle is completely epithelialized. She has chronic venous insufficiency, dermatosclerosis and probably lymphedema. She does not have stockings and states that she would have trouble getting over the toe stockings on in any fashion because of hip problems except 9/3; this is a patient with chronic venous insufficiency with a particularly difficult area on her left medial  lower leg and ankle. My assessment of this is that she has chronic venous insufficiency and chronic venous hypertension. And at least on the left medial ankle stasis dermatitis. The area still looks angry and inflamed. Nevertheless it is epithelialized. I do not believe there is active infection. We have ordered her bilateral juxta lite stockings. These should be easier for her to put on but need to be adjusted to 30/40 compression. If this is not successful in maintaining this area I think she is going to need formal reflux studies to see if she might be a candidate for ablation of the greater saphenous vein 08/05/2020 upon evaluation today patient appears to be doing poorly in regard to her lower extremity. Unfortunately this has reopened after being healed on Friday. She tells me she was wearing her compression wrap but that even when she woke up in the morning on Saturday morning she was leaking. Fortunately there is no signs of active infection at this time systemically though there is some question of a thing locally. She does have juxta light compression bilaterally. 08/12/2020 on evaluation today patient appears to be doing well with regard to her leg ulcer which actually is closed again at this point. With that being said she is still having some issues currently with very thin skin that has come back over and again with her swelling if we do not wrap around afraid this is can open right back up. Nonetheless I do believe that she should proceed with the vascular testing next Thursday as previously ordered and recommended. Fortunately there is no signs of active infection at this time. 9/23; this is a patient I  discharged a few weeks ago. Apparently she had a reopening on the left leg medially. This is closed now. We have ordered venous reflux studies but they are not until 10/5 at [place] 10/29/2020 Patient is a now 87 year old female. She is somewhat frail however still  lives at home largely on her own. We had her in the clinic in August and September 2021 with a wound on the left medial lower extremity in the setting of chronic venous insufficiency and lymphedema. This closed reasonably easily and we discharged her to juxta lite stockings. After her discharge she had her venous reflux studies at interventional radiology. She was seen in consultation by Dr. Earleen [name]. She did not have evidence of thrombosis. She did have significant reflux of the bilateral greater saphenous vein on the right there was reflux from the saphenofemoral junction to the proximal calf where there is further decompression through network of varicosities on the left there was reflux from the saphenofemoral junction to the distal calf there is also a varicose network decompressing the calf. She was recommended I think for venous ablations of the greater saphenous vein on the left however she is wanting to defer that it into the new year. She tells me that very shortly after she left here the last time she was not able to put the juxta lite stocking on largely the inner sleeve. She developed swelling and then leakage and skin breakdown again in the left medial lower extremity. She was seen by podiatry on 3 occasions I think they were applying Unna boots. Her son says that at one point the [facility] boot was not put on properly his wife who is a nurse had to adjust this and the swelling is actually somewhat better. She arrives in clinic today with very significant swelling of the proximal two thirds of her lower extremity erythema superficial skin breakdown on the left medial ankle and calf and weeping edema fluid. Past medical history not much change from last time she has chronic venous insufficiency with very significant reflux and lymphedema. She states that she has trouble bending over to get the inner part of the juxta lite stocking on because of hip problems. Her ABI in this clinic was not  repeated but the last time she was here it was 1.13 she was not felt to have an arterial issue 12/9; the patient's wound on the left medial lower leg is again closed. She has chronic hemosiderin and stasis dermatitis left greater than right lipodermatosclerosis left greater than right. On the right leg today we are not wrapping she says she hit this on a screen door however there is no open area here. The right leg has a lot of swelling 12/21; this is a patient we discharged 10 days ago. She had bilateral juxta lite stockings and was supposed to be getting compression pumps from her son. According the patient her son tested positive for Covid and has been on quarantine and she is unable to get the internal part of the juxta lites on properly. She therefore has weeping edema left greater than right medial and is back in clinic. 12/28. Patient comes in with no open areas on the right leg. She still has very inflamed tenderness on the left medial ankle and lower calf. This could be cellulitis or stasis dermatitis. Also similar changes on the left lateral 1/6; patient returns to clinic. We did not wrap her right leg last time we wrapped the left. I gave her a course  of antibiotics because of tenderness on the left medial ankle possible cellulitis versus severe stasis dermatitis. Everything is taken care of on the left where the 3 layer compression was. Unfortunately on the right she has inflamed edema on the right medial ankle and right lateral ankle. Poorly controlled edema. She Kaven with her juxta lite stocking on but it is not wrapped tightly enough to control the swelling. 1/13; she comes back in today after a week of compression wraps on all her wounds are healed. Her skin is dry and flaky she has stasis dermatitis of predominantly in the right medial greater than left medial lower leg extending into her foot on the right. She has a bag of stockings and related apparel including juxta lite  stockings, over the toe compression stockings, a sock Donner. She is for 1 reason or another not able to get these things on at home on her own she comes in today with her daughter from [place]. She got external compression pumps delivered the other day but has not started to use them. The other major problem is she lives alone 12/23/2020 upon evaluation today patient unfortunately presents for readmission due to issues that she is having with weeping of her lower extremities along with increased discomfort and pain. She also has increased redness. I am concerned about infection here among other things. She has not been using the compression as she tells me that this is not easy for her to do. She is also not been using her lymphedema pumps enough to even know "whether they would work or not" 2/3; patient came into the clinic last week with concern of cellulitis increasing edema with weeping. She was put on doxycycline she comes in today with dry skin once again but no wounds and no evidence of infection. She probably had stasis dermatitis. She is here with her daughter. As usual we will long discussion about how were going to arrange to have the patient put stockings on. Since she was last here she had a fall is apparently fractured L3 in the lower thoracic vertebrae. She has some discomfort. Certainly in this situation she could not be expected to put on compression stockings. She has not use the compression pumps we ordered either. Electronic Signature(s) Signed: 12/31/2020 4:57:15 PM By: Linton Ham MD Entered By: Linton Ham on 12/31/2020 14:01:46 -------------------------------------------------------------------------------- Physical Exam Details Patient Name: Date of Service: Jackie [name], Jackie CE M. 12/31/2020 12:30 PM Medical Record Number: [id number] Patient Account Number: [id number] Date of Birth/Sex: Treating RN: Mar 19, 1932 (87 y.o. Jackie [name] Primary Care Provider: Reynold [name] Other Clinician: Referring Provider: Treating Provider/Extender: Jackie [name] in Treatment: 9 Constitutional Patient is hypertensive.. Pulse regular and within target range for patient.Marland Kitchen Respirations regular, non-labored and within target range.. Temperature is normal and within the target range for the patient.Marland Kitchen Appears in no distress. Cardiovascular Pedal pulses are palpable. Notes Wound exam; there is no weeping edema. She has dry fissured skin in her distal lower legs and ankles but no open wounds. Her edema is controlled Engineer, maintenance) Signed: 12/31/2020 4:57:15 PM By: Linton Ham MD Entered By: Linton Ham on 12/31/2020 14:02:28 -------------------------------------------------------------------------------- Physician Orders Details Patient Name: Date of Service: Jackie [name], Jackie CE M. 12/31/2020 12:30 PM Medical Record Number: [id number] Patient Account Number: [id number] Date of Birth/Sex: Treating RN: 13-Dec-1931 (87 y.o. Jackie [name] Primary Care Provider: Reynold [name] Other Clinician: Referring Provider: Treating Provider/Extender: Jackie [name] in Treatment: 9 Verbal /  Phone Orders: No Diagnosis Coding ICD-10 Coding Code Description I87.332 Chronic venous hypertension (idiopathic) with ulcer and inflammation of left lower extremity I89.0 Lymphedema, not elsewhere classified L97.321 Non-pressure chronic ulcer of left ankle limited to breakdown of skin Discharge From [facility] Discharge from [place] - call if any future wound care needs. Edema Control - Lymphedema / SCD / Other Lymphedema Pumps. Use Lymphedema pumps on leg(s) 2-3 times a day for 45-60 minutes. If wearing any wraps or hose, do not remove them. Continue exercising as instructed. Elevate legs to the level of the heart or above for 30 minutes daily and/or when sitting, a frequency of: - 3-4 times a day throughout the  day. Avoid standing for long periods of time. Patient to wear own compression stockings every day. Exercise regularly Moisturize legs daily. - every night before bed. Compression stocking or Garment 20-30 mm/Hg pressure to: - Juxtalite HD to both legs. Apply in the morning and remove at night. Additional Orders / Instructions Other: - Patient and daughter to decide how to provide care with stockings and lymphedema pumps for legs once home. Electronic Signature(s) Signed: 12/31/2020 4:57:15 PM By: Linton Ham MD Signed: 12/31/2020 5:30:16 PM By: Deon Pilling Entered By: Deon Pilling on 12/31/2020 13:40:11 -------------------------------------------------------------------------------- Problem List Details Patient Name: Date of Service: Jackie [name], Jackie CE M. 12/31/2020 12:30 PM Medical Record Number: [id number] Patient Account Number: [id number] Date of Birth/Sex: Treating RN: Aug 12, 1932 (87 y.o. Jackie [name] Primary Care Provider: Reynold [name] Other Clinician: Referring Provider: Treating Provider/Extender: Jackie [name] in Treatment: 9 Active Problems ICD-10 Encounter Code Description Active Date MDM Diagnosis I87.332 Chronic venous hypertension (idiopathic) with ulcer and inflammation of left 10/29/2020 No Yes lower extremity I89.0 Lymphedema, not elsewhere classified 10/29/2020 No Yes L97.321 Non-pressure chronic ulcer of left ankle limited to breakdown of skin 10/29/2020 No Yes Inactive Problems Resolved Problems Electronic Signature(s) Signed: 12/31/2020 4:57:15 PM By: Linton Ham MD Entered By: Linton Ham on 12/31/2020 13:59:20 -------------------------------------------------------------------------------- Progress Note Details Patient Name: Date of Service: Jackie [name], Jackie CE M. 12/31/2020 12:30 PM Medical Record Number: [id number] Patient Account Number: [id number] Date of Birth/Sex: Treating RN: 1932/07/17 (87 y.o. Jackie [name] Primary Care Provider: Reynold [name] Other Clinician: Referring Provider: Treating Provider/Extender: Jackie [name] in Treatment: 9 Subjective History of Present Illness (HPI) ADMISSION 07/17/2020 This is an 87 year old woman who is referred by her nurse practitioner at [facility] who has been doing treatment for the wound on the right medial lower leg and ankle. Apparently 1 point a substantial wound however it is largely epithelialized now. Patient states its been there for about 5 or 6 weeks. On the background she has changes in the skin in this area I think the go back several years to when she had a left hip fracture. Nevertheless she does not have a wound history that I could elicit. As far as I know that they had been using TCA, Mepitel under an Unna boot they have been changing this weekly at [employer] [facility] [employer]. She also received a course of doxycycline. Past medical history very little that I can find in [place] link. She has a history of venous insufficiency bilaterally and a left hip fracture hypertension and a history of thyroidectomy for thyroid cancer. ABI in our clinic was 1.13 on the left 8/27; the patient is substantial initial wound by her description on the left medial lower leg and ankle is completely epithelialized. She  has chronic venous insufficiency, dermatosclerosis and probably lymphedema. She does not have stockings and states that she would have trouble getting over the toe stockings on in any fashion because of hip problems except 9/3; this is a patient with chronic venous insufficiency with a particularly difficult area on her left medial lower leg and ankle. My assessment of this is that she has chronic venous insufficiency and chronic venous hypertension. And at least on the left medial ankle stasis dermatitis. The area still looks angry and inflamed. Nevertheless it is epithelialized. I do not believe  there is active infection. We have ordered her bilateral juxta lite stockings. These should be easier for her to put on but need to be adjusted to 30/40 compression. If this is not successful in maintaining this area I think she is going to need formal reflux studies to see if she might be a candidate for ablation of the greater saphenous vein 08/05/2020 upon evaluation today patient appears to be doing poorly in regard to her lower extremity. Unfortunately this has reopened after being healed on Friday. She tells me she was wearing her compression wrap but that even when she woke up in the morning on Saturday morning she was leaking. Fortunately there is no signs of active infection at this time systemically though there is some question of a thing locally. She does have juxta light compression bilaterally. 08/12/2020 on evaluation today patient appears to be doing well with regard to her leg ulcer which actually is closed again at this point. With that being said she is still having some issues currently with very thin skin that has come back over and again with her swelling if we do not wrap around afraid this is can open right back up. Nonetheless I do believe that she should proceed with the vascular testing next Thursday as previously ordered and recommended. Fortunately there is no signs of active infection at this time. 9/23; this is a patient I discharged a few weeks ago. Apparently she had a reopening on the left leg medially. This is closed now. We have ordered venous reflux studies but they are not until 10/5 at [place] 10/29/2020 Patient is a now 87 year old female. She is somewhat frail however still lives at home largely on her own. We had her in the clinic in August and September 2021 with a wound on the left medial lower extremity in the setting of chronic venous insufficiency and lymphedema. This closed reasonably easily and we discharged her to juxta lite  stockings. After her discharge she had her venous reflux studies at interventional radiology. She was seen in consultation by Dr. Earleen [name]. She did not have evidence of thrombosis. She did have significant reflux of the bilateral greater saphenous vein on the right there was reflux from the saphenofemoral junction to the proximal calf where there is further decompression through network of varicosities on the left there was reflux from the saphenofemoral junction to the distal calf there is also a varicose network decompressing the calf. She was recommended I think for venous ablations of the greater saphenous vein on the left however she is wanting to defer that it into the new year. She tells me that very shortly after she left here the last time she was not able to put the juxta lite stocking on largely the inner sleeve. She developed swelling and then leakage and skin breakdown again in the left medial lower extremity. She was seen by podiatry on 3 occasions I think  they were applying Unna boots. Her son says that at one point the [facility] boot was not put on properly his wife who is a nurse had to adjust this and the swelling is actually somewhat better. She arrives in clinic today with very significant swelling of the proximal two thirds of her lower extremity erythema superficial skin breakdown on the left medial ankle and calf and weeping edema fluid. Past medical history not much change from last time she has chronic venous insufficiency with very significant reflux and lymphedema. She states that she has trouble bending over to get the inner part of the juxta lite stocking on because of hip problems. Her ABI in this clinic was not repeated but the last time she was here it was 1.13 she was not felt to have an arterial issue 12/9; the patient's wound on the left medial lower leg is again closed. She has chronic hemosiderin and stasis dermatitis left greater than right lipodermatosclerosis left  greater than right. On the right leg today we are not wrapping she says she hit this on a screen door however there is no open area here. The right leg has a lot of swelling 12/21; this is a patient we discharged 10 days ago. She had bilateral juxta lite stockings and was supposed to be getting compression pumps from her son. According the patient her son tested positive for Covid and has been on quarantine and she is unable to get the internal part of the juxta lites on properly. She therefore has weeping edema left greater than right medial and is back in clinic. 12/28. Patient comes in with no open areas on the right leg. She still has very inflamed tenderness on the left medial ankle and lower calf. This could be cellulitis or stasis dermatitis. Also similar changes on the left lateral 1/6; patient returns to clinic. We did not wrap her right leg last time we wrapped the left. I gave her a course of antibiotics because of tenderness on the left medial ankle possible cellulitis versus severe stasis dermatitis. Everything is taken care of on the left where the 3 layer compression was. Unfortunately on the right she has inflamed edema on the right medial ankle and right lateral ankle. Poorly controlled edema. She Kaven with her juxta lite stocking on but it is not wrapped tightly enough to control the swelling. 1/13; she comes back in today after a week of compression wraps on all her wounds are healed. Her skin is dry and flaky she has stasis dermatitis of predominantly in the right medial greater than left medial lower leg extending into her foot on the right. She has a bag of stockings and related apparel including juxta lite stockings, over the toe compression stockings, a sock Donner. She is for 1 reason or another not able to get these things on at home on her own she comes in today with her daughter from [place]. She got external compression pumps delivered the other day but has not started  to use them. The other major problem is she lives alone 12/23/2020 upon evaluation today patient unfortunately presents for readmission due to issues that she is having with weeping of her lower extremities along with increased discomfort and pain. She also has increased redness. I am concerned about infection here among other things. She has not been using the compression as she tells me that this is not easy for her to do. She is also not been using her lymphedema pumps enough to  even know "whether they would work or not" 2/3; patient came into the clinic last week with concern of cellulitis increasing edema with weeping. She was put on doxycycline she comes in today with dry skin once again but no wounds and no evidence of infection. She probably had stasis dermatitis. She is here with her daughter. As usual we will long discussion about how were going to arrange to have the patient put stockings on. Since she was last here she had a fall is apparently fractured L3 in the lower thoracic vertebrae. She has some discomfort. Certainly in this situation she could not be expected to put on compression stockings. She has not use the compression pumps we ordered either. Objective Constitutional Patient is hypertensive.. Pulse regular and within target range for patient.Marland Kitchen Respirations regular, non-labored and within target range.. Temperature is normal and within the target range for the patient.Marland Kitchen Appears in no distress. Vitals Time Taken: 12:55 PM, Height: 61 in, Weight: 126 lbs, BMI: 23.8, Temperature: 98.3 F, Pulse: 59 bpm, Respiratory Rate: 16 breaths/min, Blood Pressure: 160/93 mmHg. Cardiovascular Pedal pulses are palpable. General Notes: Wound exam; there is no weeping edema. She has dry fissured skin in her distal lower legs and ankles but no open wounds. Her edema is controlled Assessment Active Problems ICD-10 Chronic venous hypertension (idiopathic) with ulcer and inflammation of left  lower extremity Lymphedema, not elsewhere classified Non-pressure chronic ulcer of left ankle limited to breakdown of skin Plan Discharge From [facility] Services: Discharge from [place] - call if any future wound care needs. Edema Control - Lymphedema / SCD / Other: Lymphedema Pumps. Use Lymphedema pumps on leg(s) 2-3 times a day for 45-60 minutes. If wearing any wraps or hose, do not remove them. Continue exercising as instructed. Elevate legs to the level of the heart or above for 30 minutes daily and/or when sitting, a frequency of: - 3-4 times a day throughout the day. Avoid standing for long periods of time. Patient to wear own compression stockings every day. Exercise regularly Moisturize legs daily. - every night before bed. Compression stocking or Garment 20-30 mm/Hg pressure to: - Juxtalite HD to both legs. Apply in the morning and remove at night. Additional Orders / Instructions: Other: - Patient and daughter to decide how to provide care with stockings and lymphedema pumps for legs once home. 1. We lotion her skin and applied her juxta lite stockings went over this with her daughter 2. I had an extensive and involved discussion with the patient about her options here as I see them. This would include assisted living, going to stay with her daughter in [place] for a while to which she is vehemently refused in the past., Or having in-home care. She wants to stay in her own home, this is most important to her that I think the in-home care option would be really the best thing. 3. I am going to discharge the patient once again. Reminded to moisturize her legs, stockings on in the morning [juxta lites] Electronic Signature(s) Signed: 12/31/2020 4:57:15 PM By: Linton Ham MD Entered By: Linton Ham on 12/31/2020 14:04:57 -------------------------------------------------------------------------------- SuperBill Details Patient Name: Date of Service: Jackie [name], Jackie CE M.  12/31/2020 Medical Record Number: [id number] Patient Account Number: [id number] Date of Birth/Sex: Treating RN: 09/11/1932 (87 y.o. Jackie [name] Primary Care Provider: Reynold [name] Other Clinician: Referring Provider: Treating Provider/Extender: Jackie [name] in Treatment: 9 Diagnosis Coding ICD-10 Codes Code Description [id number] Chronic venous hypertension (idiopathic) with  ulcer and inflammation of left lower extremity I89.0 Lymphedema, not elsewhere classified L97.321 Non-pressure chronic ulcer of left ankle limited to breakdown of skin Facility Procedures CPT4 Code: [id number] Description: 99213 - WOUND CARE VISIT-LEV 3 EST PT Modifier: Quantity: 1 Physician Procedures : CPT4 Code Description Modifier [id number] 99213 - WC PHYS LEVEL 3 - EST PT ICD-10 Diagnosis Description I87.332 Chronic venous hypertension (idiopathic) with ulcer and inflammation of left lower extremity I89.0 Lymphedema, not elsewhere classified L97.321  Non-pressure chronic ulcer of left ankle limited to breakdown of skin Quantity: 1 Electronic Signature(s) Signed: 12/31/2020 4:57:15 PM By: Linton Ham MD Signed: 12/31/2020 5:30:16 PM By: Deon Pilling Entered By: Deon Pilling on 12/31/2020 15:26:13

## 2021-01-01 ENCOUNTER — Encounter: Payer: Self-pay | Admitting: Podiatry

## 2021-01-01 ENCOUNTER — Ambulatory Visit: Payer: Medicare Other | Admitting: Podiatry

## 2021-01-01 ENCOUNTER — Other Ambulatory Visit: Payer: Self-pay

## 2021-01-01 DIAGNOSIS — I872 Venous insufficiency (chronic) (peripheral): Secondary | ICD-10-CM | POA: Diagnosis not present

## 2021-01-01 DIAGNOSIS — M79675 Pain in left toe(s): Secondary | ICD-10-CM

## 2021-01-01 DIAGNOSIS — B351 Tinea unguium: Secondary | ICD-10-CM

## 2021-01-01 DIAGNOSIS — M79674 Pain in right toe(s): Secondary | ICD-10-CM

## 2021-01-01 DIAGNOSIS — R609 Edema, unspecified: Secondary | ICD-10-CM | POA: Diagnosis not present

## 2021-01-01 NOTE — Progress Notes (Signed)
LATANZA, PFEFFERKORN [place]. ([id number]) Visit Report for 12/31/2020 Fall Risk Assessment Details Patient Name: Date of Service: Jackie [name] CE M. 12/31/2020 12:30 PM Medical Record Number: [id number] Patient Account Number: [id number] Date of Birth/Sex: Treating RN: 1932-09-27 (87 y.o. [name] Primary Care Dalaysia Harms: [name] Other Clinician: Referring Analis Distler: Treating Ludia Gartland/Extender: [name] in Treatment: 9 Fall Risk Assessment Items Have you had 2 or more falls in the last 12 monthso 0 Yes Have you had any fall that resulted in injury in the last 12 monthso 0 Yes FALLS RISK SCREEN History of falling - immediate or within 3 months 25 Yes Secondary diagnosis (Do you have 2 or more medical diagnoseso) 15 Yes Ambulatory aid None/bed rest/wheelchair/nurse 0 No Crutches/cane/walker 15 Yes Furniture 0 No Intravenous therapy Access/Saline/Heparin Lock 0 No Gait/Transferring Normal/ bed rest/ wheelchair 0 Yes Weak (short steps with or without shuffle, stooped but able to lift head while walking, may seek 0 No support from furniture) Impaired (short steps with shuffle, may have difficulty arising from chair, head down, impaired 0 No balance) Mental Status Oriented to own ability 0 Yes Electronic Signature(s) Signed: 01/01/2021 4:51:44 PM By: Levan Hurst RN, BSN Entered By: Levan Hurst on 12/31/2020 12:56:11

## 2021-01-01 NOTE — Progress Notes (Signed)
This patient returns to my office for at risk foot care.  This patient requires this care by a professional since this patient will be at risk due to having venous stasis  Legs  B/L.  This patient is unable to cut nails herself since the patient cannot reach her nails.These nails are painful walking and wearing shoes.  This patient presents for at risk foot care today.  Patient has been treated for her leg swelling/lymphedema  B/L.  General Appearance  Alert, conversant and in no acute stress.  Vascular  Dorsalis pedis and posterior tibial  pulses are weakly palpable  bilaterally.  Capillary return is within normal limits  Bilaterally.Cold feet  Bilaterally.  Absent digital hair  B/L.  Venous stasis legs  B/L.  Neurologic  Senn-Weinstein monofilament wire test within normal limits  bilaterally. Muscle power within normal limits bilaterally.  Nails Thick disfigured discolored nails with subungual debris  from hallux to fifth toes bilaterally. No evidence of bacterial infection or drainage bilaterally.  Orthopedic  No limitations of motion  feet .  No crepitus or effusions noted.  No bony pathology or digital deformities noted.  Skin  normotropic skin with no porokeratosis noted bilaterally.  No signs of infections or ulcers noted.     Onychomycosis  Pain in right toes  Pain in left toes  Consent was obtained for treatment procedures.   Mechanical debridement of nails 1-5  bilaterally performed with a nail nipper.  Filed with dremel without incident.    Return office visit  3 months                    Told patient to return for periodic foot care and evaluation due to potential at risk complications.   Gardiner Barefoot DPM

## 2021-01-01 NOTE — Progress Notes (Signed)
Jackie [name], Jackie [name] [place]. ([id number]) Visit Report for 12/31/2020 Arrival Information Details Patient Name: Date of Service: Jackie [name] CE M. 12/31/2020 12:30 PM Medical Record Number: [id number] Patient Account Number: [id number] Date of Birth/Sex: Treating RN: 1932/08/24 (87 y.o. Jackie [name] Primary Care Katilyn Miltenberger: [name] Other Clinician: Referring Zakira Ressel: Treating Claudetta Sallie/Extender: [name] in Treatment: 9 Visit Information History Since Last Visit Added or deleted any medications: No Patient Arrived: Jackie [name] Any new allergies or adverse reactions: No Arrival Time: 12:54 Had a fall or experienced change in Yes Accompanied By: daughter activities of daily living that may affect Transfer Assistance: None risk of falls: Patient Identification Verified: Yes Signs or symptoms of abuse/neglect since last visito No Secondary Verification Process Completed: Yes Hospitalized since last visit: No Patient Requires Transmission-Based Precautions: No Implantable device outside of the clinic excluding No Patient Has Alerts: Yes cellular tissue based products placed in the center Patient Alerts: ABI: L 1.13 07/2020 since last visit: Has Dressing in Place as Prescribed: Yes Has Compression in Place as Prescribed: Yes Pain Present Now: No Electronic Signature(s) Signed: 01/01/2021 4:51:44 PM By: Levan Hurst RN, BSN Entered By: Levan Hurst on 12/31/2020 12:56:19 -------------------------------------------------------------------------------- Clinic Level of Care Assessment Details Patient Name: Date of Service: Jackie [name], [name] 12/31/2020 12:30 PM Medical Record Number: [id number] Patient Account Number: [id number] Date of Birth/Sex: Treating RN: 1932/05/17 (87 y.o. Jackie [name], [name] Primary Care Jackie [name]: [name] Other Clinician: Referring Lashaunda Schild: Treating Lataisha Colan/Extender: [name] in Treatment: 9 Clinic Level  of Care Assessment Items TOOL 4 Quantity Score X- 1 0 Use when only an EandM is performed on FOLLOW-UP visit ASSESSMENTS - Nursing Assessment / Reassessment X- 1 10 Reassessment of Co-morbidities (includes updates in patient status) X- 1 5 Reassessment of Adherence to Treatment Plan ASSESSMENTS - Wound and Skin A ssessment / Reassessment []  - 0 Simple Wound Assessment / Reassessment - one wound []  - 0 Complex Wound Assessment / Reassessment - multiple wounds X- 1 10 Dermatologic / Skin Assessment (not related to wound area) ASSESSMENTS - Focused Assessment X- 2 5 Circumferential Edema Measurements - multi extremities X- 1 10 Nutritional Assessment / Counseling / Intervention []  - 0 Lower Extremity Assessment (monofilament, tuning fork, pulses) []  - 0 Peripheral Arterial Disease Assessment (using hand held doppler) ASSESSMENTS - Ostomy and/or Continence Assessment and Care []  - 0 Incontinence Assessment and Management []  - 0 Ostomy Care Assessment and Management (repouching, etc.) PROCESS - Coordination of Care []  - 0 Simple Patient / Family Education for ongoing care X- 1 20 Complex (extensive) Patient / Family Education for ongoing care X- 1 10 Staff obtains Programmer, systems, Records, T Results / Process Orders est []  - 0 Staff telephones HHA, Nursing Homes / Clarify orders / etc []  - 0 Routine Transfer to another Facility (non-emergent condition) []  - 0 Routine Hospital Admission (non-emergent condition) []  - 0 New Admissions / Biomedical engineer / Ordering NPWT Apligraf, etc. , []  - 0 Emergency Hospital Admission (emergent condition) []  - 0 Simple Discharge Coordination X- 1 15 Complex (extensive) Discharge Coordination PROCESS - Special Needs []  - 0 Pediatric / Minor Patient Management []  - 0 Isolation Patient Management []  - 0 Hearing / Language / Visual special needs []  - 0 Assessment of Community assistance (transportation, D/C planning, etc.) []  -  0 Additional assistance / Altered mentation []  - 0 Support Surface(s) Assessment (bed, cushion, seat, etc.) INTERVENTIONS - Wound Cleansing / Measurement []  - 0 Simple  Wound Cleansing - one wound []  - 0 Complex Wound Cleansing - multiple wounds []  - 0 Wound Imaging (photographs - any number of wounds) []  - 0 Wound Tracing (instead of photographs) []  - 0 Simple Wound Measurement - one wound []  - 0 Complex Wound Measurement - multiple wounds INTERVENTIONS - Wound Dressings []  - 0 Small Wound Dressing one or multiple wounds []  - 0 Medium Wound Dressing one or multiple wounds []  - 0 Large Wound Dressing one or multiple wounds []  - 0 Application of Medications - topical []  - 0 Application of Medications - injection INTERVENTIONS - Miscellaneous []  - 0 External ear exam []  - 0 Specimen Collection (cultures, biopsies, blood, body fluids, etc.) []  - 0 Specimen(s) / Culture(s) sent or taken to Lab for analysis []  - 0 Patient Transfer (multiple staff / Civil Service fast streamer / Similar devices) []  - 0 Simple Staple / Suture removal (25 or less) []  - 0 Complex Staple / Suture removal (26 or more) []  - 0 Hypo / Hyperglycemic Management (close monitor of Blood Glucose) []  - 0 Ankle / Brachial Index (ABI) - do not check if billed separately X- 1 5 Vital Signs Has the patient been seen at the hospital within the last three years: Yes Total Score: 95 Level Of Care: New/Established - Level 3 Electronic Signature(s) Signed: 12/31/2020 5:30:16 PM By: Deon Pilling Entered By: Deon Pilling on 12/31/2020 15:26:07 -------------------------------------------------------------------------------- Encounter Discharge Information Details Patient Name: Date of Service: Jackie [name], [name] 12/31/2020 12:30 PM Medical Record Number: [id number] Patient Account Number: [id number] Date of Birth/Sex: Treating RN: 1932-04-17 (87 y.o. Jackie [name] Primary Care Alayja Armas: [name] Other  Clinician: Referring Dorris Pierre: Treating Zaydon Kinser/Extender: [name] in Treatment: 9 Encounter Discharge Information Items Discharge Condition: Stable Ambulatory Status: Ambulatory Discharge Destination: Home Transportation: Private Auto Accompanied By: daughter Schedule Follow-up Appointment: Yes Clinical Summary of Care: Patient Declined Electronic Signature(s) Signed: 12/31/2020 5:53:40 PM By: Baruch Gouty RN, BSN Entered By: Baruch Gouty on 12/31/2020 14:44:49 -------------------------------------------------------------------------------- Lower Extremity Assessment Details Patient Name: Date of Service: Jackie [name], [name] 12/31/2020 12:30 PM Medical Record Number: [id number] Patient Account Number: [id number] Date of Birth/Sex: Treating RN: 03-09-1932 (87 y.o. Jackie [name] Primary Care Ty Buntrock: [name] Other Clinician: Referring Burnis Halling: Treating [place] Nabor/Extender: [name] in Treatment: 9 Edema Assessment Assessed: [name]: No] [name]: No] Edema: [Left: Yes] [Right: Yes] Calf Left: Right: Point of Measurement: 38 cm From Medial Instep 36.5 cm 38 cm Ankle Left: Right: Point of Measurement: 9 cm From Medial Instep 21 cm 21 cm Vascular Assessment Pulses: Dorsalis Pedis Palpable: [Left:Yes] [Right:Yes] Electronic Signature(s) Signed: 01/01/2021 4:51:44 PM By: Levan Hurst RN, BSN Entered By: Levan Hurst on 12/31/2020 13:09:02 -------------------------------------------------------------------------------- Multi-Disciplinary Care Plan Details Patient Name: Date of Service: Jackie [name], [name] 12/31/2020 12:30 PM Medical Record Number: [id number] Patient Account Number: [id number] Date of Birth/Sex: Treating RN: Apr 04, 1932 (87 y.o. [name] Primary Care Jaelie Aguilera: [name] Other Clinician: Referring Kennard Fildes: Treating Tippi Mccrae/Extender: [name] in  Treatment: 9 Active Inactive Electronic Signature(s) Signed: 12/31/2020 5:30:16 PM By: Deon Pilling Entered By: Deon Pilling on 12/31/2020 13:43:17 -------------------------------------------------------------------------------- Pain Assessment Details Patient Name: Date of Service: Jackie [name], [name] 12/31/2020 12:30 PM Medical Record Number: [id number] Patient Account Number: [id number] Date of Birth/Sex: Treating RN: [birth year] (87 y.o. Jackie [name] Primary Care Jimma Ortman: [name] Other Clinician: Referring Ron Beske: Treating Kymir Coles/Extender: [name]  in Treatment: 9 Active Problems Location of Pain Severity and Description of Pain Patient Has Paino No Site Locations Pain Management and Medication Current Pain Management: Electronic Signature(s) Signed: 01/01/2021 4:51:44 PM By: Levan Hurst RN, BSN Entered By: Levan Hurst on 12/31/2020 12:55:35 -------------------------------------------------------------------------------- Patient/Caregiver Education Details Patient Name: Date of Service: Jackie [name], [place]. 2/3/2022andnbsp12:30 PM Medical Record Number: [id number] Patient Account Number: [id number] Date of Birth/Gender: Treating RN: Sep 29, 1932 (87 y.o. [name] Primary Care Physician: [name] Other Clinician: Referring Physician: Treating Physician/Extender: [name] in Treatment: 9 Education Assessment Education Provided To: Patient Education Topics Provided Venous: Handouts: Managing Venous Disease and Related Ulcers Methods: Explain/Verbal Responses: Reinforcements needed Electronic Signature(s) Signed: 12/31/2020 5:30:16 PM By: Deon Pilling Entered By: Deon Pilling on 12/31/2020 12:51:05 -------------------------------------------------------------------------------- Vitals Details Patient Name: Date of Service: Jackie [name], [name] 12/31/2020 12:30 PM Medical Record  Number: [id number] Patient Account Number: [id number] Date of Birth/Sex: Treating RN: [birth year] (87 y.o. Jackie [name] Primary Care Gibson Telleria: [name] Other Clinician: Referring Crawford Tamura: Treating Chelcee Korpi/Extender: [name] in Treatment: 9 Vital Signs Time Taken: 12:55 Temperature (F): 98.3 Height (in): 61 Pulse (bpm): 59 Weight (lbs): 126 Respiratory Rate (breaths/min): 16 Body Mass Index (BMI): 23.8 Blood Pressure (mmHg): 160/93 Reference Range: 80 - 120 mg / dl Electronic Signature(s) Signed: 01/01/2021 4:51:44 PM By: Levan Hurst RN, BSN Entered By: Levan Hurst on 12/31/2020 12:55:30

## 2021-01-14 ENCOUNTER — Other Ambulatory Visit: Payer: Self-pay

## 2021-01-14 ENCOUNTER — Encounter (HOSPITAL_BASED_OUTPATIENT_CLINIC_OR_DEPARTMENT_OTHER): Payer: Medicare Other | Admitting: Internal Medicine

## 2021-01-14 DIAGNOSIS — L97321 Non-pressure chronic ulcer of left ankle limited to breakdown of skin: Secondary | ICD-10-CM | POA: Diagnosis not present

## 2021-01-14 NOTE — Progress Notes (Signed)
[name], [name] ([id number]) Visit Report for [name] Arrival Information Details Patient Name: Date of Service: Jackie [name] CE M. [name] 2:30 PM Medical Record Number: [id number] Patient Account Number: [id number] Date of Birth/Sex: Treating RN: 11-30-1931 (87 y.o. Jackie [name] Primary Care Burr Soffer: Jackie [name] Other Clinician: Referring Jackie [name]: Treating Jackie [name]/Extender: Jackie [name] in Treatment: 11 Visit Information History Since Last Visit Added or deleted any medications: No Patient Arrived: Jackie [name] Any new allergies or adverse reactions: No Arrival Time: 14:32 Had a fall or experienced change in No Accompanied By: self activities of daily living that may affect Transfer Assistance: None risk of falls: Patient Identification Verified: Yes Signs or symptoms of abuse/neglect since last visito No Secondary Verification Process Completed: Yes Hospitalized since last visit: No Patient Requires Transmission-Based Precautions: No Implantable device outside of the clinic excluding No Patient Has Alerts: Yes cellular tissue based products placed in the center Patient Alerts: ABI: L 1.13 07/2020 since last visit: Has Compression in Place as Prescribed: Yes Pain Present Now: No Electronic Signature(s) Signed: 01/14/2021 6:31:13 PM By: Jackie Gouty RN, BSN Entered By: Jackie [name] on 01/14/2021 14:40:52 -------------------------------------------------------------------------------- Compression Therapy Details Patient Name: Date of Service: Jackie [name], [place]. [name] 2:30 PM Medical Record Number: [id number] Patient Account Number: [id number] Date of Birth/Sex: Treating RN: Mar 26, 1932 (87 y.o. Jackie [name] Primary Care Mykal Kirchman: Jackie [name] Other Clinician: Referring Pernell Lenoir: Treating Jackie [name]/Extender: Jackie [name] in Treatment: 11 Compression Therapy Performed for Wound Assessment: NonWound  Condition Lymphedema - Right Leg Performed By: Clinician Jackie Gouty, RN Compression Type: Three Layer Post Procedure Diagnosis Same as Pre-procedure Electronic Signature(s) Signed: 01/14/2021 5:51:10 PM By: Jackie [name] Entered By: Jackie [name] on 01/14/2021 15:18:04 -------------------------------------------------------------------------------- Compression Therapy Details Patient Name: Date of Service: Jackie [name], Jackie [name] 2:30 PM Medical Record Number: [id number] Patient Account Number: [id number] Date of Birth/Sex: Treating RN: 18-Jul-1932 (87 y.o. Jackie [name] Primary Care Jackie [name]: Jackie [name] Other Clinician: Referring Jackie [name]: Treating Jackie [name]/Extender: Jackie [name] in Treatment: 11 Compression Therapy Performed for Wound Assessment: NonWound Condition Lymphedema - Left Leg Performed By: Clinician Jackie Pilling, RN Compression Type: Three Layer Post Procedure Diagnosis Same as Pre-procedure Electronic Signature(s) Signed: 01/14/2021 5:51:10 PM By: Jackie [name] Entered By: Jackie [name] on 01/14/2021 15:18:16 -------------------------------------------------------------------------------- Encounter Discharge Information Details Patient Name: Date of Service: Jackie [name], Jackie [name] 2:30 PM Medical Record Number: [id number] Patient Account Number: [id number] Date of Birth/Sex: Treating RN: Apr 29, 1932 (87 y.o. Jackie [name] Primary Care Jackie [name] Other Clinician: Referring Jackie [name]: Jackie [name] in Treatment: 11 Encounter Discharge Information Items Discharge Condition: Stable Ambulatory Status: Cane Discharge Destination: Home Transportation: Private Auto Accompanied By: self Schedule Follow-up Appointment: Yes Clinical Summary of Care: Electronic Signature(s) Signed: 01/14/2021 5:51:10 PM By: Jackie [name] Entered By: Jackie [name] on  01/14/2021 17:27:29 -------------------------------------------------------------------------------- Lower Extremity Assessment Details Patient Name: Date of Service: Jackie [name], Jackie [name] 2:30 PM Medical Record Number: [id number] Patient Account Number: [id number] Date of Birth/Sex: Treating RN: [birth year] (87 y.o. Jackie [name] Primary Care Jackie [name]: Jackie [name] Other Clinician: Referring Jancarlo Biermann: Treating Jackie [name]/Extender: Jackie [name] in Treatment: 11 Edema Assessment Assessed: Jackie [name]: No] [Right: No] Edema: [Left: Yes] [Right: Yes] Calf Left: Right: Point of Measurement: 38 cm From Medial Instep 33.5 cm 33.3 cm Ankle Left: Right: Point of Measurement: 9 cm From Medial Instep 22.2 cm 22.3 cm Vascular  Assessment Pulses: Dorsalis Pedis Palpable: [Left:Yes] [Right:Yes] Electronic Signature(s) Signed: 01/14/2021 6:31:13 PM By: Jackie Gouty RN, BSN Entered By: Jackie [name] on 01/14/2021 14:47:18 -------------------------------------------------------------------------------- Multi-Disciplinary Care Plan Details Patient Name: Date of Service: Jackie [name], Jackie [name] 2:30 PM Medical Record Number: [id number] Patient Account Number: [id number] Date of Birth/Sex: Treating RN: Jan 16, 1932 (87 y.o. Jackie [name] Primary Care Jackie [name]: Jackie [name] Other Clinician: Referring Jackie [name]: Treating Jackie [name]/Extender: Jackie [name] in Treatment: 11 Active Inactive Venous Leg Ulcer Nursing Diagnoses: Actual venous Insuffiency (use after diagnosis is confirmed) Knowledge deficit related to disease process and management Potential for venous Insuffiency (use before diagnosis confirmed) Goals: Patient will maintain optimal edema control Date Initiated: 12/23/2020 Target Resolution Date: 02/26/2021 Goal Status: Active Interventions: Assess peripheral edema status every visit. Compression as  ordered Provide education on venous insufficiency Treatment Activities: T ordered outside of clinic : 12/23/2020 est Therapeutic compression applied : 12/23/2020 Notes: Electronic Signature(s) Signed: 01/14/2021 5:51:10 PM By: Jackie [name] Entered By: Jackie [name] on 01/14/2021 15:24:47 -------------------------------------------------------------------------------- Non-Wound Condition Assessment Details Patient Name: Date of Service: Jackie [name], Jackie [name] 2:30 PM Medical Record Number: [id number] Patient Account Number: [id number] Date of Birth/Sex: Treating RN: 1932/07/11 (87 y.o. Jackie [name] Primary Care Miriam Kestler: Jackie [name] Other Clinician: Referring Novelle Addair: Treating Blaine Guiffre/Extender: Jackie [name] in Treatment: 11 Non-Wound Condition: Condition: Lymphedema Location: Leg Side: Right Notes weeping, excoriated shin lower half of lower leg, foot and leg red and tender Electronic Signature(s) Signed: 01/14/2021 6:31:13 PM By: Jackie Gouty RN, BSN Entered By: Jackie [name] on 01/14/2021 14:50:01 -------------------------------------------------------------------------------- Non-Wound Condition Assessment Details Patient Name: Date of Service: Jackie [name], Jackie [name] 2:30 PM Medical Record Number: [id number] Patient Account Number: [id number] Date of Birth/Sex: Treating RN: [birth year] (87 y.o. Jackie [name] Primary Care Delaney Schnick: Jackie [name] Other Clinician: Referring Lameeka Schleifer: Treating Ilyaas Musto/Extender: Jackie [name] in Treatment: 11 Non-Wound Condition: Condition: Lymphedema Location: Leg Side: Left Notes excoriated, weeping skin circumferential ankle Electronic Signature(s) Signed: 01/14/2021 6:31:13 PM By: Jackie Gouty RN, BSN Entered By: Jackie [name] on 01/14/2021 14:51:34 -------------------------------------------------------------------------------- Pain Assessment  Details Patient Name: Date of Service: Jackie [name], Jackie [name] 2:30 PM Medical Record Number: [id number] Patient Account Number: [id number] Date of Birth/Sex: Treating RN: 09-21-1932 (87 y.o. Jackie [name] Primary Care Tevon Berhane: Jackie [name] Other Clinician: Referring Jusitn Salsgiver: Treating Kenzlie Disch/Extender: Jackie [name] in Treatment: 11 Active Problems Location of Pain Severity and Description of Pain Patient Has Paino Yes Site Locations Pain Location: Pain Location: Generalized Pain With Dressing Change: Yes Duration of the Pain. Constant / Intermittento Intermittent Rate the pain. Current Pain Level: 0 Worst Pain Level: 8 Character of Pain Describe the Pain: Sharp, Tender Pain Management and Medication Current Pain Management: Activity: Yes Other: change dressing Is the Current Pain Management Adequate: Adequate How does your wound impact your activities of daily livingo Sleep: Yes Bathing: No Appetite: No Relationship With Others: No Bladder Continence: No Emotions: Yes Bowel Continence: No Work: No Toileting: No Drive: No Dressing: No Hobbies: No Notes bilateral lower legs Electronic Signature(s) Signed: 01/14/2021 6:31:13 PM By: Jackie Gouty RN, BSN Entered By: Jackie [name] on 01/14/2021 14:48:31 -------------------------------------------------------------------------------- Patient/Caregiver Education Details Patient Name: Date of Service: Jackie [name], [place]. 2/17/2022andnbsp2:30 PM Medical Record Number: [id number] Patient Account Number: [id number] Date of Birth/Gender: Treating RN: [birth year] (87 y.o. Jackie [name] Primary Care Physician: Jackie [name] Other Clinician: Referring Physician: Treating  Physician/Extender: Jackie [name] in Treatment: 11 Education Assessment Education Provided To: Patient Education Topics Provided Venous: Handouts: Controlling Swelling with Compression  Stockings , Controlling Swelling with Multilayered Compression Wraps Methods: Explain/Verbal Responses: Reinforcements needed Electronic Signature(s) Signed: 01/14/2021 5:51:10 PM By: Jackie [name] Signed: 01/14/2021 5:51:10 PM By: Jackie [name] Entered By: Jackie [name] on 01/14/2021 15:25:05 -------------------------------------------------------------------------------- Vitals Details Patient Name: Date of Service: Jackie [name], Jackie [name] 2:30 PM Medical Record Number: [id number] Patient Account Number: [id number] Date of Birth/Sex: Treating RN: [birth year] (87 y.o. Jackie [name] Primary Care Kahil Agner: Jackie [name] Other Clinician: Referring Jakyron Fabro: Treating Guadalupe Kerekes/Extender: Jackie [name] in Treatment: 11 Vital Signs Time Taken: 14:41 Temperature (F): 98 Height (in): 61 Pulse (bpm): 73 Source: Stated Respiratory Rate (breaths/min): 18 Weight (lbs): 126 Blood Pressure (mmHg): 146/77 Source: Stated Reference Range: 80 - 120 mg / dl Body Mass Index (BMI): 23.8 Electronic Signature(s) Signed: 01/14/2021 6:31:13 PM By: Jackie Gouty RN, BSN Entered By: Jackie [name] on 01/14/2021 14:41:31

## 2021-01-14 NOTE — Progress Notes (Signed)
Jackie, [name] [place]. ([id number]) Visit Report for 01/14/2021 HPI Details Patient Name: Date of Service: Jackie [name] CE M. 01/14/2021 2:30 PM Medical Record Number: [id number] Patient Account Number: [id number] Date of Birth/Sex: Treating RN: Jan 16, 1932 (87 y.o. Jackie [name] Primary Care Provider: Reynold [name] Other Clinician: Referring Provider: Treating Provider/Extender: Jackie [name] in Treatment: 11 History of Present Illness HPI Description: ADMISSION 07/17/2020 This is an 87 year old woman who is referred by her nurse practitioner at [facility] who has been doing treatment for the wound on the right medial lower leg and ankle. Apparently 1 point a substantial wound however it is largely epithelialized now. Patient states its been there for about 5 or 6 weeks. On the background she has changes in the skin in this area I think the go back several years to when she had a left hip fracture. Nevertheless she does not have a wound history that I could elicit. As far as I know that they had been using TCA, Mepitel under an Unna boot they have been changing this weekly at [facility]. She also received a course of doxycycline. Past medical history very little that I can find in [place] link. She has a history of venous insufficiency bilaterally and a left hip fracture hypertension and a history of thyroidectomy for thyroid cancer. ABI in our clinic was 1.13 on the left 8/27; the patient is substantial initial wound by her description on the left medial lower leg and ankle is completely epithelialized. She has chronic venous insufficiency, dermatosclerosis and probably lymphedema. She does not have stockings and states that she would have trouble getting over the toe stockings on in any fashion because of hip problems except 9/3; this is a patient with chronic venous insufficiency with a particularly difficult area on her left medial  lower leg and ankle. My assessment of this is that she has chronic venous insufficiency and chronic venous hypertension. And at least on the left medial ankle stasis dermatitis. The area still looks angry and inflamed. Nevertheless it is epithelialized. I do not believe there is active infection. We have ordered her bilateral juxta lite stockings. These should be easier for her to put on but need to be adjusted to 30/40 compression. If this is not successful in maintaining this area I think she is going to need formal reflux studies to see if she might be a candidate for ablation of the greater saphenous vein 08/05/2020 upon evaluation today patient appears to be doing poorly in regard to her lower extremity. Unfortunately this has reopened after being healed on Friday. She tells me she was wearing her compression wrap but that even when she woke up in the morning on Saturday morning she was leaking. Fortunately there is no signs of active infection at this time systemically though there is some question of a thing locally. She does have juxta light compression bilaterally. 08/12/2020 on evaluation today patient appears to be doing well with regard to her leg ulcer which actually is closed again at this point. With that being said she is still having some issues currently with very thin skin that has come back over and again with her swelling if we do not wrap around afraid this is can open right back up. Nonetheless I do believe that she should proceed with the vascular testing next Thursday as previously ordered and recommended. Fortunately there is no signs of active infection at this time. 9/23; this is a patient I  discharged a few weeks ago. Apparently she had a reopening on the left leg medially. This is closed now. We have ordered venous reflux studies but they are not until 10/5 at Jackie [name] 10/29/2020 Patient is a now 87 year old female. She is somewhat frail however still  lives at home largely on her own. We had her in the clinic in August and September 2021 with a wound on the left medial lower extremity in the setting of chronic venous insufficiency and lymphedema. This closed reasonably easily and we discharged her to juxta lite stockings. After her discharge she had her venous reflux studies at interventional radiology. She was seen in consultation by Jackie [name]. She did not have evidence of thrombosis. She did have significant reflux of the bilateral greater saphenous vein on the right there was reflux from the saphenofemoral junction to the proximal calf where there is further decompression through network of varicosities on the left there was reflux from the saphenofemoral junction to the distal calf there is also a varicose network decompressing the calf. She was recommended I think for venous ablations of the greater saphenous vein on the left however she is wanting to defer that it into the new year. She tells me that very shortly after she left here the last time she was not able to put the juxta lite stocking on largely the inner sleeve. She developed swelling and then leakage and skin breakdown again in the left medial lower extremity. She was seen by podiatry on 3 occasions I think they were applying Unna boots. Her son says that at one point the [facility] boot was not put on properly his wife who is a nurse had to adjust this and the swelling is actually somewhat better. She arrives in clinic today with very significant swelling of the proximal two thirds of her lower extremity erythema superficial skin breakdown on the left medial ankle and calf and weeping edema fluid. Past medical history not much change from last time she has chronic venous insufficiency with very significant reflux and lymphedema. She states that she has trouble bending over to get the inner part of the juxta lite stocking on because of hip problems. Her ABI in this clinic was not  repeated but the last time she was here it was 1.13 she was not felt to have an arterial issue 12/9; the patient's wound on the left medial lower leg is again closed. She has chronic hemosiderin and stasis dermatitis left greater than right lipodermatosclerosis left greater than right. On the right leg today we are not wrapping she says she hit this on a screen door however there is no open area here. The right leg has a lot of swelling 12/21; this is a patient we discharged 10 days ago. She had bilateral juxta lite stockings and was supposed to be getting compression pumps from her son. According the patient her son tested positive for Covid and has been on quarantine and she is unable to get the internal part of the juxta lites on properly. She therefore has weeping edema left greater than right medial and is back in clinic. 12/28. Patient comes in with no open areas on the right leg. She still has very inflamed tenderness on the left medial ankle and lower calf. This could be cellulitis or stasis dermatitis. Also similar changes on the left lateral 1/6; patient returns to clinic. We did not wrap her right leg last time we wrapped the left. I gave her a course  of antibiotics because of tenderness on the left medial ankle possible cellulitis versus severe stasis dermatitis. Everything is taken care of on the left where the 3 layer compression was. Unfortunately on the right she has inflamed edema on the right medial ankle and right lateral ankle. Poorly controlled edema. She Kaven with her juxta lite stocking on but it is not wrapped tightly enough to control the swelling. 1/13; she comes back in today after a week of compression wraps on all her wounds are healed. Her skin is dry and flaky she has stasis dermatitis of predominantly in the right medial greater than left medial lower leg extending into her foot on the right. She has a bag of stockings and related apparel including juxta lite  stockings, over the toe compression stockings, a sock Donner. She is for 1 reason or another not able to get these things on at home on her own she comes in today with her daughter from [place]. She got external compression pumps delivered the other day but has not started to use them. The other major problem is she lives alone 12/23/2020 upon evaluation today patient unfortunately presents for readmission due to issues that she is having with weeping of her lower extremities along with increased discomfort and pain. She also has increased redness. I am concerned about infection here among other things. She has not been using the compression as she tells me that this is not easy for her to do. She is also not been using her lymphedema pumps enough to even know "whether they would work or not" 2/3; patient came into the clinic last week with concern of cellulitis increasing edema with weeping. She was put on doxycycline she comes in today with dry skin once again but no wounds and no evidence of infection. She probably had stasis dermatitis. She is here with her daughter. As usual we will long discussion about how were going to arrange to have the patient put stockings on. Since she was last here she had a fall is apparently fractured L3 in the lower thoracic vertebrae. She has some discomfort. Certainly in this situation she could not be expected to put on compression stockings. She has not use the compression pumps we ordered either. 2/17; we discharge the patient 2 weeks ago with bilateral juxta lite stockings. We also suggested in-home care in the morning to help her get the stockings on with the options of being perhaps assisted living or going to live in [place] with her daughter however she is done none of the above. She called the clinic multiple times to report weeping edema. She states today than on Monday, 3 days ago her sheets were soaked with fluid when she woke up in the  morning. Electronic Signature(s) Signed: 01/14/2021 5:29:55 PM By: Linton Ham MD Entered By: Linton Ham on 01/14/2021 15:42:53 -------------------------------------------------------------------------------- Physical Exam Details Patient Name: Date of Service: Jackie [name], [name] 01/14/2021 2:30 PM Medical Record Number: [id number] Patient Account Number: [id number] Date of Birth/Sex: Treating RN: 10-17-1932 (87 y.o. Jackie [name] Primary Care Provider: Reynold [name] Other Clinician: Referring Provider: Treating Provider/Extender: Jackie [name] in Treatment: 11 Constitutional Patient is hypertensive.. Pulse regular and within target range for patient.Marland Kitchen Respirations regular, non-labored and within target range.. Temperature is normal and within the target range for the patient.Marland Kitchen Appears in no distress. Cardiovascular Pedal pulses palpable and strong bilaterally.. Notes Wound exam; the entire lower one third of her legs and top of her  feet are erythematous but nontender. I think this represents excess fluid in the tightly adherent skin of her lower extremities. There is nonpitting edema in the upper two thirds of her legs poorly controlled. Her peripheral pulses are palpable. I do not believe there is any evidence of infection here but her skin is looking somewhat denuded in terms of surface epithelium Electronic Signature(s) Signed: 01/14/2021 5:29:55 PM By: Linton Ham MD Entered By: Linton Ham on 01/14/2021 15:45:12 -------------------------------------------------------------------------------- Physician Orders Details Patient Name: Date of Service: Jackie [name], [place]. 01/14/2021 2:30 PM Medical Record Number: [id number] Patient Account Number: [id number] Date of Birth/Sex: Treating RN: [birth year] (87 y.o. Jackie [name] Primary Care Provider: Reynold [name] Other Clinician: Referring Provider: Treating Provider/Extender: Jackie [name] in Treatment: 11 Verbal / Phone Orders: No Diagnosis Coding Follow-up Appointments ppointment in 1 week. - Thursday Return A Bathing/ Shower/ Hygiene May shower with protection but do not get wound dressing(s) wet. Edema Control - Lymphedema / SCD / Other Elevate legs to the level of the heart or above for 30 minutes daily and/or when sitting, a frequency of: - 3-4 times a day. Avoid standing for long periods of time. Exercise regularly Additional Orders / Instructions Other: - Patient to decide on which in home aid agency to set up for daily visits to aid in applying compression stockings once wound center as finished with compression wraps. Non Wound Condition Bilateral Lower Extremities Other Non Wound Condition Orders/Instructions: - In clinic: Apply TCA and lotion liberally to both legs. Apply 3 layer compression wrap bilateral lower legs. Electronic Signature(s) Signed: 01/14/2021 5:29:55 PM By: Linton Ham MD Signed: 01/14/2021 5:51:10 PM By: Deon Pilling Entered By: Deon Pilling on 01/14/2021 15:24:27 -------------------------------------------------------------------------------- Problem List Details Patient Name: Date of Service: Jackie [name], [name] 01/14/2021 2:30 PM Medical Record Number: [id number] Patient Account Number: [id number] Date of Birth/Sex: Treating RN: [birth year] (87 y.o. 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 01/14/2021 2:30 PM Medical Record Number: [id number] Patient Account Number: [id number] Date of Birth/Sex: Treating RN: 1932/10/19 (87 y.o. Jackie [name] Primary Care Provider: Reynold [name] Other Clinician: Referring Provider: Treating Provider/Extender: Jackie [name] in Treatment: 11 Subjective History of Present Illness (HPI) ADMISSION 07/17/2020 This is an 87 year old woman who is referred by her nurse practitioner at [facility] who has been doing treatment for the wound on the right medial lower leg and ankle. Apparently 1 point a substantial wound however it is largely epithelialized now. Patient states its been there for about 5 or 6 weeks. On the background she has changes in the skin in this area I think the go back several years to when she had a left hip fracture. Nevertheless she does not have a wound history that I could elicit. As far as I know that they had been using TCA, Mepitel under an Unna boot they  have been changing this weekly at [facility]. She also received a course of doxycycline. Past medical history very little that I can find in [place] link. She has a history of venous insufficiency bilaterally and a left hip fracture hypertension and a history of thyroidectomy for thyroid cancer. ABI in our clinic was 1.13 on the left 8/27; the patient is substantial initial wound by her description on the left medial lower leg and ankle is completely epithelialized. She has chronic venous insufficiency, dermatosclerosis and probably lymphedema. She does not have stockings and states that she would have trouble getting over the  toe stockings on in any fashion because of hip problems except 9/3; this is a patient with chronic venous insufficiency with a particularly difficult area on her left medial lower leg and ankle. My assessment of this is that she has chronic venous insufficiency and chronic venous hypertension. And at least on the left medial ankle stasis dermatitis. The area still looks angry and inflamed. Nevertheless it is epithelialized. I do not believe there is active infection. We have ordered her bilateral juxta lite stockings. These should be easier for her to put on but need to be adjusted to 30/40 compression. If this is not successful in maintaining this area I think she is going to need formal reflux studies to see if she might be a candidate for ablation of the greater saphenous vein 08/05/2020 upon evaluation today patient appears to be doing poorly in regard to her lower extremity. Unfortunately this has reopened after being healed on Friday. She tells me she was wearing her compression wrap but that even when she woke up in the morning on Saturday morning she was leaking. Fortunately there is no signs of active infection at this time systemically though there is some question of a thing locally. She does have juxta light compression bilaterally. 08/12/2020 on evaluation today patient appears to be doing well with regard to her leg ulcer which actually is closed again at this point. With that being said she is still having some issues currently with very thin skin that has come back over and again with her swelling if we do not wrap around afraid this is can open right back up. Nonetheless I do believe that she should proceed with the vascular testing next Thursday as previously ordered and recommended. Fortunately there is no signs of active infection at this time. 9/23; this is a patient I discharged a few weeks ago. Apparently she had a reopening on the left leg medially. This is closed now. We have  ordered venous reflux studies but they are not until 10/5 at [place] 10/29/2020 Patient is a now 87 year old female. She is somewhat frail however still lives at home largely on her own. We had her in the clinic in August and September 2021 with a wound on the left medial lower extremity in the setting of chronic venous insufficiency and lymphedema. This closed reasonably easily and we discharged her to juxta lite stockings. After her discharge she had her venous reflux studies at interventional radiology. She was seen in consultation by Jackie [name]. She did not have evidence of thrombosis. She did have significant reflux of the bilateral greater saphenous vein on the right there was reflux from the saphenofemoral junction to the proximal calf where there is further decompression through network of varicosities on the left there was reflux from the saphenofemoral junction to the distal calf there is also a varicose network decompressing the calf.  She was recommended I think for venous ablations of the greater saphenous vein on the left however she is wanting to defer that it into the new year. She tells me that very shortly after she left here the last time she was not able to put the juxta lite stocking on largely the inner sleeve. She developed swelling and then leakage and skin breakdown again in the left medial lower extremity. She was seen by podiatry on 3 occasions I think they were applying Unna boots. Her son says that at one point the [facility] boot was not put on properly his wife who is a nurse had to adjust this and the swelling is actually somewhat better. She arrives in clinic today with very significant swelling of the proximal two thirds of her lower extremity erythema superficial skin breakdown on the left medial ankle and calf and weeping edema fluid. Past medical history not much change from last time she has chronic venous insufficiency with very significant  reflux and lymphedema. She states that she has trouble bending over to get the inner part of the juxta lite stocking on because of hip problems. Her ABI in this clinic was not repeated but the last time she was here it was 1.13 she was not felt to have an arterial issue 12/9; the patient's wound on the left medial lower leg is again closed. She has chronic hemosiderin and stasis dermatitis left greater than right lipodermatosclerosis left greater than right. On the right leg today we are not wrapping she says she hit this on a screen door however there is no open area here. The right leg has a lot of swelling 12/21; this is a patient we discharged 10 days ago. She had bilateral juxta lite stockings and was supposed to be getting compression pumps from her son. According the patient her son tested positive for Covid and has been on quarantine and she is unable to get the internal part of the juxta lites on properly. She therefore has weeping edema left greater than right medial and is back in clinic. 12/28. Patient comes in with no open areas on the right leg. She still has very inflamed tenderness on the left medial ankle and lower calf. This could be cellulitis or stasis dermatitis. Also similar changes on the left lateral 1/6; patient returns to clinic. We did not wrap her right leg last time we wrapped the left. I gave her a course of antibiotics because of tenderness on the left medial ankle possible cellulitis versus severe stasis dermatitis. Everything is taken care of on the left where the 3 layer compression was. Unfortunately on the right she has inflamed edema on the right medial ankle and right lateral ankle. Poorly controlled edema. She Kaven with her juxta lite stocking on but it is not wrapped tightly enough to control the swelling. 1/13; she comes back in today after a week of compression wraps on all her wounds are healed. Her skin is dry and flaky she has stasis dermatitis  of predominantly in the right medial greater than left medial lower leg extending into her foot on the right. She has a bag of stockings and related apparel including juxta lite stockings, over the toe compression stockings, a sock Donner. She is for 1 reason or another not able to get these things on at home on her own she comes in today with her daughter from [place]. She got external compression pumps delivered the other day but has not started to use  them. The other major problem is she lives alone 12/23/2020 upon evaluation today patient unfortunately presents for readmission due to issues that she is having with weeping of her lower extremities along with increased discomfort and pain. She also has increased redness. I am concerned about infection here among other things. She has not been using the compression as she tells me that this is not easy for her to do. She is also not been using her lymphedema pumps enough to even know "whether they would work or not" 2/3; patient came into the clinic last week with concern of cellulitis increasing edema with weeping. She was put on doxycycline she comes in today with dry skin once again but no wounds and no evidence of infection. She probably had stasis dermatitis. She is here with her daughter. As usual we will long discussion about how were going to arrange to have the patient put stockings on. Since she was last here she had a fall is apparently fractured L3 in the lower thoracic vertebrae. She has some discomfort. Certainly in this situation she could not be expected to put on compression stockings. She has not use the compression pumps we ordered either. 2/17; we discharge the patient 2 weeks ago with bilateral juxta lite stockings. We also suggested in-home care in the morning to help her get the stockings on with the options of being perhaps assisted living or going to live in [place] with her daughter however she is done none of the above.  She called the clinic multiple times to report weeping edema. She states today than on Monday, 3 days ago her sheets were soaked with fluid when she woke up in the morning. Objective Constitutional Patient is hypertensive.. Pulse regular and within target range for patient.Marland Kitchen Respirations regular, non-labored and within target range.. Temperature is normal and within the target range for the patient.Marland Kitchen Appears in no distress. Vitals Time Taken: 2:41 PM, Height: 61 in, Source: Stated, Weight: 126 lbs, Source: Stated, BMI: 23.8, Temperature: 98 F, Pulse: 73 bpm, Respiratory Rate: 18 breaths/min, Blood Pressure: 146/77 mmHg. Cardiovascular Pedal pulses palpable and strong bilaterally.. General Notes: Wound exam; the entire lower one third of her legs and top of her feet are erythematous but nontender. I think this represents excess fluid in the tightly adherent skin of her lower extremities. There is nonpitting edema in the upper two thirds of her legs poorly controlled. Her peripheral pulses are palpable. I do not believe there is any evidence of infection here but her skin is looking somewhat denuded in terms of surface epithelium Assessment Active Problems ICD-10 Chronic venous hypertension (idiopathic) with ulcer and inflammation of left lower extremity Lymphedema, not elsewhere classified Non-pressure chronic ulcer of left ankle limited to breakdown of skin Procedures There was a Three Layer Compression Therapy Procedure by Baruch Gouty, RN. Post procedure Diagnosis Wound #: Same as Pre-Procedure There was a Three Layer Compression Therapy Procedure by Deon Pilling, RN. Post procedure Diagnosis Wound #: Same as Pre-Procedure Plan Follow-up Appointments: Return Appointment in 1 week. - Thursday Bathing/ Shower/ Hygiene: May shower with protection but do not get wound dressing(s) wet. Edema Control - Lymphedema / SCD / Other: Elevate legs to the level of the heart or above for 30  minutes daily and/or when sitting, a frequency of: - 3-4 times a day. Avoid standing for long periods of time. Exercise regularly Additional Orders / Instructions: Other: - Patient to decide on which in home aid agency to set up for daily  visits to aid in applying compression stockings once wound center as finished with compression wraps. Non Wound Condition: Other Non Wound Condition Orders/Instructions: - In clinic: Apply TCA and lotion liberally to both legs. Apply 3 layer compression wrap bilateral lower legs. 1. We put TCA on her lower legs and put her back in 3 layer compression. Her legs will probably look a lot better next week with less erythema and less swelling 2. We have not found a way to keep this intact at home. She cannot put the juxta lite stockings on properly and there is simply not tight enough 3. Most of this is chronic venous insufficiency with stasis dermatitis and secondary lymphedema Electronic Signature(s) Signed: 01/14/2021 5:29:55 PM By: Linton Ham MD Entered By: Linton Ham on 01/14/2021 15:46:52 -------------------------------------------------------------------------------- SuperBill Details Patient Name: Date of Service: Jackie [name], [name] 01/14/2021 Medical Record Number: [id number] Patient Account Number: [id number] Date of Birth/Sex: Treating RN: 1932-03-28 (87 y.o. Jackie [name] Primary Care Provider: Reynold [name] Other Clinician: Referring Provider: Treating Provider/Extender: Jackie [name] in Treatment: 11 Diagnosis Coding ICD-10 Codes Code Description [phone] Chronic venous hypertension (idiopathic) with ulcer and inflammation of left lower extremity I89.0 Lymphedema, not elsewhere classified L97.321 Non-pressure chronic ulcer of left ankle limited to breakdown of skin Facility Procedures CPT4: Code [id number] Description: 81 BILATERAL: Application of multi-layer venous compression system; leg (below  knee), including ankle and t. Modifier: Quantity: 1 Physician Procedures : CPT4 Code Description Modifier [id number] - WC PHYS LEVEL 3 - EST PT ICD-10 Diagnosis Description I87.332 Chronic venous hypertension (idiopathic) with ulcer and inflammation of left lower extremity I89.0 Lymphedema, not elsewhere classified L97.321  Non-pressure chronic ulcer of left ankle limited to breakdown of skin Quantity: 1 Electronic Signature(s) Signed: 01/14/2021 5:29:55 PM By: Linton Ham MD Entered By: Linton Ham on 01/14/2021 15:47:11

## 2021-01-21 ENCOUNTER — Other Ambulatory Visit: Payer: Self-pay

## 2021-01-21 ENCOUNTER — Encounter (HOSPITAL_BASED_OUTPATIENT_CLINIC_OR_DEPARTMENT_OTHER): Payer: Medicare Other | Admitting: Internal Medicine

## 2021-01-21 DIAGNOSIS — L97321 Non-pressure chronic ulcer of left ankle limited to breakdown of skin: Secondary | ICD-10-CM | POA: Diagnosis not present

## 2021-01-21 NOTE — Progress Notes (Signed)
LOURDEZ, MCGAHAN Curran. ([id number]) Visit Report for 01/21/2021 HPI Details Patient Name: Date of Service: [name] CE M. 01/21/2021 2:30 PM Medical Record Number: [id number] Patient Account Number: [id number] Date of Birth/Sex: Treating RN: 09/05/1932 (87 y.o. [name] Primary Care Provider: [name] Other Clinician: Referring Provider: Treating Provider/Extender: [name] in Treatment: 12 History of Present Illness HPI Description: ADMISSION 07/17/2020 This is an 87 year old woman who is referred by her nurse practitioner at [facility] who has been doing treatment for the wound on the right medial lower leg and ankle. Apparently 1 point a substantial wound however it is largely epithelialized now. Patient states its been there for about 5 or 6 weeks. On the background she has changes in the skin in this area I think the go back several years to when she had a left hip fracture. Nevertheless she does not have a wound history that I could elicit. As far as I know that they had been using TCA, Mepitel under an Unna boot they have been changing this weekly at [employer] [facility] [employer]. She also received a course of doxycycline. Past medical history very little that I can find in [place] link. She has a history of venous insufficiency bilaterally and a left hip fracture hypertension and a history of thyroidectomy for thyroid cancer. ABI in our clinic was 1.13 on the left 8/27; the patient is substantial initial wound by her description on the left medial lower leg and ankle is completely epithelialized. She has chronic venous insufficiency, dermatosclerosis and probably lymphedema. She does not have stockings and states that she would have trouble getting over the toe stockings on in any fashion because of hip problems except 9/3; this is a patient with chronic venous insufficiency with a particularly difficult area on her left medial  lower leg and ankle. My assessment of this is that she has chronic venous insufficiency and chronic venous hypertension. And at least on the left medial ankle stasis dermatitis. The area still looks angry and inflamed. Nevertheless it is epithelialized. I do not believe there is active infection. We have ordered her bilateral juxta lite stockings. These should be easier for her to put on but need to be adjusted to 30/40 compression. If this is not successful in maintaining this area I think she is going to need formal reflux studies to see if she might be a candidate for ablation of the greater saphenous vein 08/05/2020 upon evaluation today patient appears to be doing poorly in regard to her lower extremity. Unfortunately this has reopened after being healed on Friday. She tells me she was wearing her compression wrap but that even when she woke up in the morning on Saturday morning she was leaking. Fortunately there is no signs of active infection at this time systemically though there is some question of a thing locally. She does have juxta light compression bilaterally. 08/12/2020 on evaluation today patient appears to be doing well with regard to her leg ulcer which actually is closed again at this point. With that being said she is still having some issues currently with very thin skin that has come back over and again with her swelling if we do not wrap around afraid this is can open right back up. Nonetheless I do believe that she should proceed with the vascular testing next Thursday as previously ordered and recommended. Fortunately there is no signs of active infection at this time. 9/23; this is a patient I  discharged a few weeks ago. Apparently she had a reopening on the left leg medially. This is closed now. We have ordered venous reflux studies but they are not until 10/5 at [place] 10/29/2020 Patient is a now 87 year old female. She is somewhat frail however still  lives at home largely on her own. We had her in the clinic in August and September 2021 with a wound on the left medial lower extremity in the setting of chronic venous insufficiency and lymphedema. This closed reasonably easily and we discharged her to juxta lite stockings. After her discharge she had her venous reflux studies at interventional radiology. She was seen in consultation by Dr. Earleen Newport. She did not have evidence of thrombosis. She did have significant reflux of the bilateral greater saphenous vein on the right there was reflux from the saphenofemoral junction to the proximal calf where there is further decompression through network of varicosities on the left there was reflux from the saphenofemoral junction to the distal calf there is also a varicose network decompressing the calf. She was recommended I think for venous ablations of the greater saphenous vein on the left however she is wanting to defer that it into the new year. She tells me that very shortly after she left here the last time she was not able to put the juxta lite stocking on largely the inner sleeve. She developed swelling and then leakage and skin breakdown again in the left medial lower extremity. She was seen by podiatry on 3 occasions I think they were applying Unna boots. Her son says that at one point the [facility] boot was not put on properly his wife who is a nurse had to adjust this and the swelling is actually somewhat better. She arrives in clinic today with very significant swelling of the proximal two thirds of her lower extremity erythema superficial skin breakdown on the left medial ankle and calf and weeping edema fluid. Past medical history not much change from last time she has chronic venous insufficiency with very significant reflux and lymphedema. She states that she has trouble bending over to get the inner part of the juxta lite stocking on because of hip problems. Her ABI in this clinic was not  repeated but the last time she was here it was 1.13 she was not felt to have an arterial issue 12/9; the patient's wound on the left medial lower leg is again closed. She has chronic hemosiderin and stasis dermatitis left greater than right lipodermatosclerosis left greater than right. On the right leg today we are not wrapping she says she hit this on a screen door however there is no open area here. The right leg has a lot of swelling 12/21; this is a patient we discharged 10 days ago. She had bilateral juxta lite stockings and was supposed to be getting compression pumps from her son. According the patient her son tested positive for Covid and has been on quarantine and she is unable to get the internal part of the juxta lites on properly. She therefore has weeping edema left greater than right medial and is back in clinic. 12/28. Patient comes in with no open areas on the right leg. She still has very inflamed tenderness on the left medial ankle and lower calf. This could be cellulitis or stasis dermatitis. Also similar changes on the left lateral 1/6; patient returns to clinic. We did not wrap her right leg last time we wrapped the left. I gave her a course  of antibiotics because of tenderness on the left medial ankle possible cellulitis versus severe stasis dermatitis. Everything is taken care of on the left where the 3 layer compression was. Unfortunately on the right she has inflamed edema on the right medial ankle and right lateral ankle. Poorly controlled edema. She Kaven with her juxta lite stocking on but it is not wrapped tightly enough to control the swelling. 1/13; she comes back in today after a week of compression wraps on all her wounds are healed. Her skin is dry and flaky she has stasis dermatitis of predominantly in the right medial greater than left medial lower leg extending into her foot on the right. She has a bag of stockings and related apparel including juxta lite  stockings, over the toe compression stockings, a sock Donner. She is for 1 reason or another not able to get these things on at home on her own she comes in today with her daughter from [place]. She got external compression pumps delivered the other day but has not started to use them. The other major problem is she lives alone 12/23/2020 upon evaluation today patient unfortunately presents for readmission due to issues that she is having with weeping of her lower extremities along with increased discomfort and pain. She also has increased redness. I am concerned about infection here among other things. She has not been using the compression as she tells me that this is not easy for her to do. She is also not been using her lymphedema pumps enough to even know "whether they would work or not" 2/3; patient came into the clinic last week with concern of cellulitis increasing edema with weeping. She was put on doxycycline she comes in today with dry skin once again but no wounds and no evidence of infection. She probably had stasis dermatitis. She is here with her daughter. As usual we will long discussion about how were going to arrange to have the patient put stockings on. Since she was last here she had a fall is apparently fractured L3 in the lower thoracic vertebrae. She has some discomfort. Certainly in this situation she could not be expected to put on compression stockings. She has not use the compression pumps we ordered either. 2/17; we discharge the patient 2 weeks ago with bilateral juxta lite stockings. We also suggested in-home care in the morning to help her get the stockings on with the options of being perhaps assisted living or going to live in [place] with her daughter however she is done none of the above. She called the clinic multiple times to report weeping edema. She states today than on Monday, 3 days ago her sheets were soaked with fluid when she woke up in the morning. 2/24;  patient once again today looks fine. There is no swelling no weeping edema and no wounds. She brought in her stockings. Electronic Signature(s) Signed: 01/21/2021 5:09:37 PM By: Linton Ham MD Entered By: Linton Ham on 01/21/2021 16:25:39 -------------------------------------------------------------------------------- Physical Exam Details Patient Name: Date of Service: [name], [name] 01/21/2021 2:30 PM Medical Record Number: [id number] Patient Account Number: [id number] Date of Birth/Sex: Treating RN: 1932-03-14 (87 y.o. [name] Primary Care Provider: [name] Other Clinician: Referring Provider: Treating Provider/Extender: [name] in Treatment: 12 Constitutional Patient is hypotensive.. Pulse regular and within target range for patient.Marland Kitchen Respirations regular, non-labored and within target range.. Temperature is normal and within the target range for the patient.Marland Kitchen Appears in no distress.  Cardiovascular Pedal pulses are palpable. Notes Wound exam; her skin looks a lot better. The edema and the erythema are gone still dry flaking surface epithelium there are no open wounds no drainage. Electronic Signature(s) Signed: 01/21/2021 5:09:37 PM By: Linton Ham MD Entered By: Linton Ham on 01/21/2021 16:27:10 -------------------------------------------------------------------------------- Physician Orders Details Patient Name: Date of Service: [name], [place]. 01/21/2021 2:30 PM Medical Record Number: [id number] Patient Account Number: [id number] Date of Birth/Sex: Treating RN: [birth year] (87 y.o. [name] Primary Care Provider: [name] Other Clinician: Referring Provider: Treating Provider/Extender: [name] in Treatment: 12 Verbal / Phone Orders: No Diagnosis Coding Follow-up Appointments Return Appointment in 1 week. Bathing/ Shower/ Hygiene May shower with protection but do  not get wound dressing(s) wet. Edema Control - Lymphedema / SCD / Other Elevate legs to the level of the heart or above for 30 minutes daily and/or when sitting, a frequency of: - 3-4 times a day. Avoid standing for long periods of time. Exercise regularly Moisturize legs daily. - every night before bed. Compression stocking or Garment 20-30 mm/Hg pressure to: - juxtalite HD apply in the morning and remove at night. In clinic today apply lotion and juxtalite HD to both legs. Additional Orders / Instructions Other: - Patient to decide on which in home aid agency to set up for daily visits to aid in applying compression stockings once wound center as finished with compression wraps. Electronic Signature(s) Signed: 01/21/2021 5:09:37 PM By: Linton Ham MD Signed: 01/21/2021 5:39:27 PM By: Deon Pilling Entered By: Deon Pilling on 01/21/2021 15:35:51 -------------------------------------------------------------------------------- Problem List Details Patient Name: Date of Service: [name], [name] 01/21/2021 2:30 PM Medical Record Number: [id number] Patient Account Number: [id number] Date of Birth/Sex: Treating RN: 01-04-1932 (87 y.o. 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 01/21/2021 2:30 PM Medical Record Number: [id number] Patient Account Number: [id number] Date of Birth/Sex: Treating RN: 1932-05-09 (87 y.o. [name] Primary Care Provider: [name] Other Clinician: Referring Provider: Treating Provider/Extender: [name] in Treatment: 12 Subjective History of Present Illness (HPI) ADMISSION 07/17/2020 This is an 87 year old woman who is referred by her nurse practitioner at [facility] who has been doing treatment for the wound on the right medial lower leg and ankle. Apparently 1 point a substantial wound however it is largely epithelialized now. Patient states its been there for about 5 or 6 weeks. On the background she has changes in the skin in this area I think the go back several years to when she had a left hip fracture. Nevertheless she does not have a wound history that I could elicit. As far as I know that they had been using TCA, Mepitel under an Unna boot they have been changing this weekly at [facility] [employer]. She also received a course of doxycycline. Past medical history very little that I can  find in [place] link. She has a history of venous insufficiency bilaterally and a left hip fracture hypertension and a history of thyroidectomy for thyroid cancer. ABI in our clinic was 1.13 on the left 8/27; the patient is substantial initial wound by her description on the left medial lower leg and ankle is completely epithelialized. She has chronic venous insufficiency, dermatosclerosis and probably lymphedema. She does not have stockings and states that she would have trouble getting over the toe stockings on in any fashion because of hip problems except 9/3; this is a patient with chronic venous insufficiency with a  particularly difficult area on her left medial lower leg and ankle. My assessment of this is that she has chronic venous insufficiency and chronic venous hypertension. And at least on the left medial ankle stasis dermatitis. The area still looks angry and inflamed. Nevertheless it is epithelialized. I do not believe there is active infection. We have ordered her bilateral juxta lite stockings. These should be easier for her to put on but need to be adjusted to 30/40 compression. If this is not successful in maintaining this area I think she is going to need formal reflux studies to see if she might be a candidate for ablation of the greater saphenous vein 08/05/2020 upon evaluation today patient appears to be doing poorly in regard to her lower extremity. Unfortunately this has reopened after being healed on Friday. She tells me she was wearing her compression wrap but that even when she woke up in the morning on Saturday morning she was leaking. Fortunately there is no signs of active infection at this time systemically though there is some question of a thing locally. She does have juxta light compression bilaterally. 08/12/2020 on evaluation today patient appears to be doing well with regard to her leg ulcer which actually is closed again at this point. With that being said she is still having some issues currently with very thin skin that has come back over and again with her swelling if we do not wrap around afraid this is can open right back up. Nonetheless I do believe that she should proceed with the vascular testing next Thursday as previously ordered and recommended. Fortunately there is no signs of active infection at this time. 9/23; this is a patient I discharged a few weeks ago. Apparently she had a reopening on the left leg medially. This is closed now. We have ordered venous reflux studies but they are not until 10/5 at [place] 10/29/2020 Patient is a now  87 year old female. She is somewhat frail however still lives at home largely on her own. We had her in the clinic in August and September 2021 with a wound on the left medial lower extremity in the setting of chronic venous insufficiency and lymphedema. This closed reasonably easily and we discharged her to juxta lite stockings. After her discharge she had her venous reflux studies at interventional radiology. She was seen in consultation by Dr. Earleen Newport. She did not have evidence of thrombosis. She did have significant reflux of the bilateral greater saphenous vein on the right there was reflux from the saphenofemoral junction to the proximal calf where there is further decompression through network of varicosities on the left there was reflux from the saphenofemoral junction to the distal calf there is also a varicose network decompressing the calf. She was recommended I think for venous ablations of the greater saphenous vein on the left however she is wanting to defer that it  into the new year. She tells me that very shortly after she left here the last time she was not able to put the juxta lite stocking on largely the inner sleeve. She developed swelling and then leakage and skin breakdown again in the left medial lower extremity. She was seen by podiatry on 3 occasions I think they were applying Unna boots. Her son says that at one point the [facility] boot was not put on properly his wife who is a nurse had to adjust this and the swelling is actually somewhat better. She arrives in clinic today with very significant swelling of the proximal two thirds of her lower extremity erythema superficial skin breakdown on the left medial ankle and calf and weeping edema fluid. Past medical history not much change from last time she has chronic venous insufficiency with very significant reflux and lymphedema. She states that she has trouble bending over to get the inner part of the juxta lite stocking on because  of hip problems. Her ABI in this clinic was not repeated but the last time she was here it was 1.13 she was not felt to have an arterial issue 12/9; the patient's wound on the left medial lower leg is again closed. She has chronic hemosiderin and stasis dermatitis left greater than right lipodermatosclerosis left greater than right. On the right leg today we are not wrapping she says she hit this on a screen door however there is no open area here. The right leg has a lot of swelling 12/21; this is a patient we discharged 10 days ago. She had bilateral juxta lite stockings and was supposed to be getting compression pumps from her son. According the patient her son tested positive for Covid and has been on quarantine and she is unable to get the internal part of the juxta lites on properly. She therefore has weeping edema left greater than right medial and is back in clinic. 12/28. Patient comes in with no open areas on the right leg. She still has very inflamed tenderness on the left medial ankle and lower calf. This could be cellulitis or stasis dermatitis. Also similar changes on the left lateral 1/6; patient returns to clinic. We did not wrap her right leg last time we wrapped the left. I gave her a course of antibiotics because of tenderness on the left medial ankle possible cellulitis versus severe stasis dermatitis. Everything is taken care of on the left where the 3 layer compression was. Unfortunately on the right she has inflamed edema on the right medial ankle and right lateral ankle. Poorly controlled edema. She K[place] with her juxta lite stocking on but it is not wrapped tightly enough to control the swelling. 1/13; she comes back in today after a week of compression wraps on all her wounds are healed. Her skin is dry and flaky she has stasis dermatitis of predominantly in the right medial greater than left medial lower leg extending into her foot on the right. She has a bag of stockings  and related apparel including juxta lite stockings, over the toe compression stockings, a sock Donner. She is for 1 reason or another not able to get these things on at home on her own she comes in today with her daughter from [place]. She got external compression pumps delivered the other day but has not started to use them. The other major problem is she lives alone 12/23/2020 upon evaluation today patient unfortunately presents for readmission due to issues that she is  having with weeping of her lower extremities along with increased discomfort and pain. She also has increased redness. I am concerned about infection here among other things. She has not been using the compression as she tells me that this is not easy for her to do. She is also not been using her lymphedema pumps enough to even know "whether they would work or not" 2/3; patient came into the clinic last week with concern of cellulitis increasing edema with weeping. She was put on doxycycline she comes in today with dry skin once again but no wounds and no evidence of infection. She probably had stasis dermatitis. She is here with her daughter. As usual we will long discussion about how were going to arrange to have the patient put stockings on. Since she was last here she had a fall is apparently fractured L3 in the lower thoracic vertebrae. She has some discomfort. Certainly in this situation she could not be expected to put on compression stockings. She has not use the compression pumps we ordered either. 2/17; we discharge the patient 2 weeks ago with bilateral juxta lite stockings. We also suggested in-home care in the morning to help her get the stockings on with the options of being perhaps assisted living or going to live in [place] with her daughter however she is done none of the above. She called the clinic multiple times to report weeping edema. She states today than on Monday, 3 days ago her sheets were soaked with  fluid when she woke up in the morning. 2/24; patient once again today looks fine. There is no swelling no weeping edema and no wounds. She brought in her stockings. Objective Constitutional Patient is hypotensive.. Pulse regular and within target range for patient.Marland Kitchen Respirations regular, non-labored and within target range.. Temperature is normal and within the target range for the patient.Marland Kitchen Appears in no distress. Vitals Time Taken: 2:43 PM, Height: 61 in, Weight: 126 lbs, BMI: 23.8, Temperature: 98.1 F, Pulse: 67 bpm, Respiratory Rate: 18 breaths/min, Blood Pressure: 180/89 mmHg. Cardiovascular Pedal pulses are palpable. General Notes: Wound exam; her skin looks a lot better. The edema and the erythema are gone still dry flaking surface epithelium there are no open wounds no drainage. Assessment Active Problems ICD-10 Chronic venous hypertension (idiopathic) with ulcer and inflammation of left lower extremity Lymphedema, not elsewhere classified Non-pressure chronic ulcer of left ankle limited to breakdown of skin Plan Follow-up Appointments: Return Appointment in 1 week. Bathing/ Shower/ Hygiene: May shower with protection but do not get wound dressing(s) wet. Edema Control - Lymphedema / SCD / Other: Elevate legs to the level of the heart or above for 30 minutes daily and/or when sitting, a frequency of: - 3-4 times a day. Avoid standing for long periods of time. Exercise regularly Moisturize legs daily. - every night before bed. Compression stocking or Garment 20-30 mm/Hg pressure to: - juxtalite HD apply in the morning and remove at night. In clinic today apply lotion and juxtalite HD to both legs. Additional Orders / Instructions: Other: - Patient to decide on which in home aid agency to set up for daily visits to aid in applying compression stockings once wound center as finished with compression wraps. 1. Once again the patient's problem from last week is resolved with  simple compression 2. The problem is is that she has trouble putting on external compression stockings. She will not agree to have assistance including assistance that she might have to pay for i.e. in-home aide  3. She also refuses to spend time with her daughter in [place] or consider moving into a facility like an assisted living Electronic Signature(s) Signed: 01/21/2021 5:09:37 PM By: Linton Ham MD Entered By: Linton Ham on 01/21/2021 16:29:50 -------------------------------------------------------------------------------- SuperBill Details Patient Name: Date of Service: [name], [place]. 01/21/2021 Medical Record Number: [id number] Patient Account Number: [id number] Date of Birth/Sex: Treating RN: 09-25-1932 (87 y.o. [name] Primary Care Provider: [name] Other Clinician: Referring Provider: Treating Provider/Extender: [name] in Treatment: 12 Diagnosis Coding ICD-10 Codes Code Description [phone] Chronic venous hypertension (idiopathic) with ulcer and inflammation of left lower extremity I89.0 Lymphedema, not elsewhere classified L97.321 Non-pressure chronic ulcer of left ankle limited to breakdown of skin Facility Procedures CPT4 Code: 19012224 Description: 99213 - WOUND CARE VISIT-LEV 3 EST PT Modifier: Quantity: 1 Physician Procedures : CPT4 Code Description Modifier [id number] 99213 - WC PHYS LEVEL 3 - EST PT ICD-10 Diagnosis Description I87.332 Chronic venous hypertension (idiopathic) with ulcer and inflammation of left lower extremity I89.0 Lymphedema, not elsewhere classified L97.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

## 2021-01-25 NOTE — Progress Notes (Signed)
LEETTA, HENDRIKS [place]. ([id number]) Visit Report for 01/21/2021 Arrival Information Details Patient Name: Date of Service: Jackie Lamb CE M. 01/21/2021 2:30 PM Medical Record Number: [id number] Patient Account Number: [id number] Date of Birth/Sex: Treating RN: 12-28-1931 (87 y.o. Jackie [name], Jackie [name] Primary Care Jackie [name]: Jackie [name] Other Clinician: Referring Shanicqua Coldren: Treating Jackie [name] in Treatment: 12 Visit Information History Since Last Visit Added or deleted any medications: No Patient Arrived: Jackie [name] Any new allergies or adverse reactions: No Arrival Time: 14:39 Had a fall or experienced change in No Accompanied By: self activities of daily living that may affect Transfer Assistance: None risk of falls: Patient Identification Verified: Yes Signs or symptoms of abuse/neglect since last visito No Secondary Verification Process Completed: Yes Hospitalized since last visit: No Patient Requires Transmission-Based Precautions: No Implantable device outside of the clinic excluding No Patient Has Alerts: Yes cellular tissue based products placed in the center Patient Alerts: ABI: L 1.13 07/2020 since last visit: Has Dressing in Place as Prescribed: Yes Pain Present Now: No Electronic Signature(s) Signed: 01/22/2021 1:28:50 PM By: Sandre Kitty Entered By: Sandre Kitty on 01/21/2021 14:40:54 -------------------------------------------------------------------------------- Clinic Level of Care Assessment Details Patient Name: Date of Service: Jackie Lamb CE M. 01/21/2021 2:30 PM Medical Record Number: [id number] Patient Account Number: [id number] Date of Birth/Sex: Treating RN: [birth year] (87 y.o. Jackie [name], Jackie [name] Primary Care Collyn Selk: Jackie [name] Other Clinician: Referring Jackie [name]: Treating Jackie [name]/Extender: Georgette [name] in Treatment: 12 Clinic Level of Care Assessment Items TOOL 4 Quantity Score X-  1 0 Use when only an EandM is performed on FOLLOW-UP visit ASSESSMENTS - Nursing Assessment / Reassessment X- 1 10 Reassessment of Co-morbidities (includes updates in patient status) X- 1 5 Reassessment of Adherence to Treatment Plan ASSESSMENTS - Wound and Skin A ssessment / Reassessment []  - 0 Simple Wound Assessment / Reassessment - one wound []  - 0 Complex Wound Assessment / Reassessment - multiple wounds X- 1 10 Dermatologic / Skin Assessment (not related to wound area) ASSESSMENTS - Focused Assessment X- 2 5 Circumferential Edema Measurements - multi extremities X- 1 10 Nutritional Assessment / Counseling / Intervention []  - 0 Lower Extremity Assessment (monofilament, tuning fork, pulses) []  - 0 Peripheral Arterial Disease Assessment (using hand held doppler) ASSESSMENTS - Ostomy and/or Continence Assessment and Care []  - 0 Incontinence Assessment and Management []  - 0 Ostomy Care Assessment and Management (repouching, etc.) PROCESS - Coordination of Care X - Simple Patient / Family Education for ongoing care 1 15 []  - 0 Complex (extensive) Patient / Family Education for ongoing care X- 1 10 Staff obtains Programmer, systems, Records, T Results / Process Orders est []  - 0 Staff telephones HHA, Nursing Homes / Clarify orders / etc []  - 0 Routine Transfer to another Facility (non-emergent condition) []  - 0 Routine Hospital Admission (non-emergent condition) []  - 0 New Admissions / Biomedical engineer / Ordering NPWT Apligraf, etc. , []  - 0 Emergency Hospital Admission (emergent condition) X- 1 10 Simple Discharge Coordination []  - 0 Complex (extensive) Discharge Coordination PROCESS - Special Needs []  - 0 Pediatric / Minor Patient Management []  - 0 Isolation Patient Management []  - 0 Hearing / Language / Visual special needs []  - 0 Assessment of Community assistance (transportation, D/C planning, etc.) []  - 0 Additional assistance / Altered mentation []  -  0 Support Surface(s) Assessment (bed, cushion, seat, etc.) INTERVENTIONS - Wound Cleansing / Measurement []  - 0 Simple Wound Cleansing - one wound []  - 0  Complex Wound Cleansing - multiple wounds []  - 0 Wound Imaging (photographs - any number of wounds) []  - 0 Wound Tracing (instead of photographs) []  - 0 Simple Wound Measurement - one wound []  - 0 Complex Wound Measurement - multiple wounds INTERVENTIONS - Wound Dressings []  - 0 Small Wound Dressing one or multiple wounds []  - 0 Medium Wound Dressing one or multiple wounds []  - 0 Large Wound Dressing one or multiple wounds []  - 0 Application of Medications - topical []  - 0 Application of Medications - injection INTERVENTIONS - Miscellaneous []  - 0 External ear exam []  - 0 Specimen Collection (cultures, biopsies, blood, body fluids, etc.) []  - 0 Specimen(s) / Culture(s) sent or taken to Lab for analysis []  - 0 Patient Transfer (multiple staff / Civil Service fast streamer / Similar devices) []  - 0 Simple Staple / Suture removal (25 or less) []  - 0 Complex Staple / Suture removal (26 or more) []  - 0 Hypo / Hyperglycemic Management (close monitor of Blood Glucose) []  - 0 Ankle / Brachial Index (ABI) - do not check if billed separately X- 1 5 Vital Signs Has the patient been seen at the hospital within the last three years: Yes Total Score: 85 Level Of Care: New/Established - Level 3 Electronic Signature(s) Signed: 01/21/2021 5:39:27 PM By: Deon Pilling Entered By: Deon Pilling on 01/21/2021 15:38:46 -------------------------------------------------------------------------------- Encounter Discharge Information Details Patient Name: Date of Service: Jackie [name], [place]. 01/21/2021 2:30 PM Medical Record Number: [id number] Patient Account Number: [id number] Date of Birth/Sex: Treating RN: 08/13/1932 (87 y.o. Jackie [name] Primary Care Nayara Taplin: Jackie [name] Other Clinician: Referring Brydon Spahr: Treating Jackie [name]/Extender:  Georgette [name] in Treatment: 12 Encounter Discharge Information Items Discharge Condition: Stable Ambulatory Status: Cane Discharge Destination: Home Transportation: Private Auto Accompanied By: self Schedule Follow-up Appointment: Yes Clinical Summary of Care: Patient Declined Electronic Signature(s) Signed: 01/21/2021 5:40:23 PM By: Baruch Gouty RN, BSN Entered By: Baruch Gouty on 01/21/2021 15:50:20 -------------------------------------------------------------------------------- Lower Extremity Assessment Details Patient Name: Date of Service: Jackie [name], Jackie CE M. 01/21/2021 2:30 PM Medical Record Number: [id number] Patient Account Number: [id number] Date of Birth/Sex: Treating RN: 25-Jan-1932 (87 y.o. [name] Primary Care Lakecia Deschamps: Jackie [name] Other Clinician: Referring Chynah Orihuela: Treating Josefine Fuhr/Extender: Georgette [name] in Treatment: 12 Edema Assessment Assessed: [name]: No] [name]: No] Edema: [Left: Yes] [Right: Yes] Calf Left: Right: Point of Measurement: 38 cm From Medial Instep 33.5 cm 32.5 cm Ankle Left: Right: Point of Measurement: 9 cm From Medial Instep 22.2 cm 21 cm Vascular Assessment Pulses: Dorsalis Pedis Palpable: [Left:Yes] [Right:Yes] Electronic Signature(s) Signed: 01/25/2021 5:56:44 PM By: Levan Hurst RN, BSN Entered By: Levan Hurst on 01/21/2021 15:15:58 -------------------------------------------------------------------------------- Multi Wound Chart Details Patient Name: Date of Service: Jackie [name], Jackie CE M. 01/21/2021 2:30 PM Medical Record Number: [id number] Patient Account Number: [id number] Date of Birth/Sex: Treating RN: Nov 02, 1932 (87 y.o. [name] Primary Care Doyne Micke: Jackie [name] Other Clinician: Referring Damontre Millea: Treating Roisin Mones/Extender: Georgette [name] in Treatment: 12 Vital Signs Height(in): 61 Pulse(bpm): 67 Weight(lbs):  126 Blood Pressure(mmHg): 180/89 Body Mass Index(BMI): 24 Temperature(F): 98.1 Respiratory Rate(breaths/min): 18 Wound Assessments Treatment Notes Electronic Signature(s) Signed: 01/21/2021 5:09:37 PM By: Linton Ham MD Signed: 01/21/2021 5:39:27 PM By: Deon Pilling Entered By: Linton Ham on 01/21/2021 16:25:00 -------------------------------------------------------------------------------- Multi-Disciplinary Care Plan Details Patient Name: Date of Service: Jackie [name], Jackie CE M. 01/21/2021 2:30 PM Medical Record Number: [id number] Patient Account Number: [id number] Date of Birth/Sex: Treating  RN: Feb 26, 1932 (87 y.o. [name] Primary Care Mattison Golay: Jackie [name] Other Clinician: Referring [place] Hair: Treating Lamari Beckles/Extender: Georgette [name] in Treatment: 12 Multidisciplinary Care Plan reviewed with physician Active Inactive Venous Leg Ulcer Nursing Diagnoses: Actual venous Insuffiency (use after diagnosis is confirmed) Knowledge deficit related to disease process and management Potential for venous Insuffiency (use before diagnosis confirmed) Goals: Patient will maintain optimal edema control Date Initiated: 12/23/2020 Target Resolution Date: 02/26/2021 Goal Status: Active Interventions: Assess peripheral edema status every visit. Compression as ordered Provide education on venous insufficiency Treatment Activities: T ordered outside of clinic : 12/23/2020 est Therapeutic compression applied : 12/23/2020 Notes: Electronic Signature(s) Signed: 01/21/2021 5:39:27 PM By: Deon Pilling Entered By: Deon Pilling on 01/21/2021 15:19:13 -------------------------------------------------------------------------------- Pain Assessment Details Patient Name: Date of Service: Jackie [name], Jackie CE M. 01/21/2021 2:30 PM Medical Record Number: [id number] Patient Account Number: [id number] Date of Birth/Sex: Treating RN: 1932-08-16 (87 y.o. [name] Primary Care Howard Bunte: Jackie [name] Other Clinician: Referring Reginia Battie: Treating Erhardt Dada/Extender: Georgette [name] in Treatment: 12 Active Problems Location of Pain Severity and Description of Pain Patient Has Paino No Site Locations Pain Management and Medication Current Pain Management: Electronic Signature(s) Signed: 01/21/2021 5:39:27 PM By: Deon Pilling Signed: 01/22/2021 1:28:50 PM By: Sandre Kitty Entered By: Sandre Kitty on 01/21/2021 14:44:12 -------------------------------------------------------------------------------- Patient/Caregiver Education Details Patient Name: Date of Service: Jackie [name], [place]. 2/24/2022andnbsp2:30 PM Medical Record Number: [id number] Patient Account Number: [id number] Date of Birth/Gender: Treating RN: 1932/06/20 (87 y.o. [name] Primary Care Physician: Jackie [name] Other Clinician: Referring Physician: Treating Physician/Extender: Georgette [name] in Treatment: 12 Education Assessment Education Provided To: Patient Education Topics Provided Venous: Handouts: Managing Venous Disease and Related Ulcers Methods: Explain/Verbal Responses: Reinforcements needed Electronic Signature(s) Signed: 01/21/2021 5:39:27 PM By: Deon Pilling Entered By: Deon Pilling on 01/21/2021 15:19:30 -------------------------------------------------------------------------------- Vitals Details Patient Name: Date of Service: Jackie [name], Jackie CE M. 01/21/2021 2:30 PM Medical Record Number: [id number] Patient Account Number: [id number] Date of Birth/Sex: Treating RN: Jun 30, 1932 (87 y.o. Jackie [name], Jackie [name] Primary Care Rishabh Rinkenberger: Jackie [name] Other Clinician: Referring Nathan Moctezuma: Treating Donevin Sainsbury/Extender: Georgette [name] in Treatment: 12 Vital Signs Time Taken: 14:43 Temperature (F): 98.1 Height (in): 61 Pulse (bpm): 67 Weight (lbs): 126 Respiratory Rate  (breaths/min): 18 Body Mass Index (BMI): 23.8 Blood Pressure (mmHg): 180/89 Reference Range: 80 - 120 mg / dl Electronic Signature(s) Signed: 01/22/2021 1:28:50 PM By: Sandre Kitty Entered By: Sandre Kitty on 01/21/2021 14:44:06

## 2021-01-27 ENCOUNTER — Encounter (HOSPITAL_COMMUNITY): Payer: Medicare Other

## 2021-01-28 ENCOUNTER — Encounter (HOSPITAL_BASED_OUTPATIENT_CLINIC_OR_DEPARTMENT_OTHER): Payer: Medicare Other | Attending: Internal Medicine | Admitting: Physician Assistant

## 2021-01-28 ENCOUNTER — Other Ambulatory Visit: Payer: Self-pay

## 2021-01-28 DIAGNOSIS — I87332 Chronic venous hypertension (idiopathic) with ulcer and inflammation of left lower extremity: Secondary | ICD-10-CM | POA: Diagnosis not present

## 2021-01-28 DIAGNOSIS — I890 Lymphedema, not elsewhere classified: Secondary | ICD-10-CM | POA: Diagnosis present

## 2021-01-28 DIAGNOSIS — L97321 Non-pressure chronic ulcer of left ankle limited to breakdown of skin: Secondary | ICD-10-CM | POA: Diagnosis not present

## 2021-01-28 DIAGNOSIS — I872 Venous insufficiency (chronic) (peripheral): Secondary | ICD-10-CM | POA: Diagnosis not present

## 2021-01-28 NOTE — Progress Notes (Signed)
Jackie, [name] [place]. ([id number]) Visit Report for 01/28/2021 Arrival Information Details Patient Name: Date of Service: Jackie [name] CE M. 01/28/2021 1:15 PM Medical Record Number: [id number] Patient Account Number: [id number] Date of Birth/Sex: Treating RN: [birth year] (87 y.o. Jackie [name], Jackie [name] Primary Care Kathyrn Warmuth: [name] Other Clinician: Referring [place] Brogden: Treating Maris Bena/Extender: Terence Lux in Treatment: 13 Visit Information History Since Last Visit Added or deleted any medications: No Patient Arrived: Jackie [name] Any new allergies or adverse reactions: No Arrival Time: 13:54 Had a fall or experienced change in No Accompanied By: self activities of daily living that may affect Transfer Assistance: None risk of falls: Patient Identification Verified: Yes Signs or symptoms of abuse/neglect since last visito No Secondary Verification Process Completed: Yes Hospitalized since last visit: No Patient Requires Transmission-Based Precautions: No Implantable device outside of the clinic excluding No Patient Has Alerts: Yes cellular tissue based products placed in the center Patient Alerts: ABI: L 1.13 07/2020 since last visit: Has Dressing in Place as Prescribed: Yes Pain Present Now: No Electronic Signature(s) Signed: 01/28/2021 5:30:41 PM By: Sandre Kitty Entered By: Sandre Kitty on 01/28/2021 13:54:30 -------------------------------------------------------------------------------- Clinic Level of Care Assessment Details Patient Name: Date of Service: Jackie [name] CE M. 01/28/2021 1:15 PM Medical Record Number: [id number] Patient Account Number: [id number] Date of Birth/Sex: Treating RN: Mar 28, 1932 (87 y.o. Jackie [name], Jackie [name] Primary Care Rajah Tagliaferro: [name] Other Clinician: Referring Jamicheal Heard: Treating Chaney Maclaren/Extender: Terence Lux in Treatment: 13 Clinic Level of Care Assessment Items TOOL 4 Quantity Score X- 1  0 Use when only an EandM is performed on FOLLOW-UP visit ASSESSMENTS - Nursing Assessment / Reassessment X- 1 10 Reassessment of Co-morbidities (includes updates in patient status) X- 1 5 Reassessment of Adherence to Treatment Plan ASSESSMENTS - Wound and Skin A ssessment / Reassessment []  - 0 Simple Wound Assessment / Reassessment - one wound []  - 0 Complex Wound Assessment / Reassessment - multiple wounds X- 1 10 Dermatologic / Skin Assessment (not related to wound area) ASSESSMENTS - Focused Assessment []  - 0 Circumferential Edema Measurements - multi extremities []  - 0 Nutritional Assessment / Counseling / Intervention []  - 0 Lower Extremity Assessment (monofilament, tuning fork, pulses) []  - 0 Peripheral Arterial Disease Assessment (using hand held doppler) ASSESSMENTS - Ostomy and/or Continence Assessment and Care []  - 0 Incontinence Assessment and Management []  - 0 Ostomy Care Assessment and Management (repouching, etc.) PROCESS - Coordination of Care X - Simple Patient / Family Education for ongoing care 1 15 []  - 0 Complex (extensive) Patient / Family Education for ongoing care X- 1 10 Staff obtains Programmer, systems, Records, T Results / Process Orders est []  - 0 Staff telephones HHA, Nursing Homes / Clarify orders / etc []  - 0 Routine Transfer to another Facility (non-emergent condition) []  - 0 Routine Hospital Admission (non-emergent condition) []  - 0 New Admissions / Biomedical engineer / Ordering NPWT Apligraf, etc. , []  - 0 Emergency Hospital Admission (emergent condition) X- 1 10 Simple Discharge Coordination []  - 0 Complex (extensive) Discharge Coordination PROCESS - Special Needs []  - 0 Pediatric / Minor Patient Management []  - 0 Isolation Patient Management []  - 0 Hearing / Language / Visual special needs []  - 0 Assessment of Community assistance (transportation, D/C planning, etc.) []  - 0 Additional assistance / Altered mentation []  -  0 Support Surface(s) Assessment (bed, cushion, seat, etc.) INTERVENTIONS - Wound Cleansing / Measurement []  - 0 Simple Wound Cleansing - one wound []  -  0 Complex Wound Cleansing - multiple wounds []  - 0 Wound Imaging (photographs - any number of wounds) []  - 0 Wound Tracing (instead of photographs) []  - 0 Simple Wound Measurement - one wound []  - 0 Complex Wound Measurement - multiple wounds INTERVENTIONS - Wound Dressings []  - 0 Small Wound Dressing one or multiple wounds []  - 0 Medium Wound Dressing one or multiple wounds []  - 0 Large Wound Dressing one or multiple wounds []  - 0 Application of Medications - topical []  - 0 Application of Medications - injection INTERVENTIONS - Miscellaneous []  - 0 External ear exam []  - 0 Specimen Collection (cultures, biopsies, blood, body fluids, etc.) []  - 0 Specimen(s) / Culture(s) sent or taken to Lab for analysis []  - 0 Patient Transfer (multiple staff / Civil Service fast streamer / Similar devices) []  - 0 Simple Staple / Suture removal (25 or less) []  - 0 Complex Staple / Suture removal (26 or more) []  - 0 Hypo / Hyperglycemic Management (close monitor of Blood Glucose) []  - 0 Ankle / Brachial Index (ABI) - do not check if billed separately X- 1 5 Vital Signs Has the patient been seen at the hospital within the last three years: Yes Total Score: 65 Level Of Care: New/Established - Level 2 Electronic Signature(s) Signed: 01/28/2021 5:49:32 PM By: Deon Pilling Entered By: Deon Pilling on 01/28/2021 15:45:48 -------------------------------------------------------------------------------- Encounter Discharge Information Details Patient Name: Date of Service: Jackie [name], [name] 01/28/2021 1:15 PM Medical Record Number: [id number] Patient Account Number: [id number] Date of Birth/Sex: Treating RN: [birth year] (87 y.o. Jackie [name] Primary Care Raiden Haydu: [name] Other Clinician: Referring Merrianne Mccumbers: Treating Kamauri Denardo/Extender:  Terence Lux in Treatment: 13 Encounter Discharge Information Items Discharge Condition: Stable Ambulatory Status: Cane Discharge Destination: Home Transportation: Private Auto Accompanied By: self Schedule Follow-up Appointment: Yes Clinical Summary of Care: Patient Declined Electronic Signature(s) Signed: 01/28/2021 6:02:48 PM By: Baruch Gouty RN, BSN Entered By: Baruch Gouty on 01/28/2021 14:57:37 -------------------------------------------------------------------------------- Lower Extremity Assessment Details Patient Name: Date of Service: Jackie [name], [name] 01/28/2021 1:15 PM Medical Record Number: [id number] Patient Account Number: [id number] Date of Birth/Sex: Treating RN: 10/31/1932 (87 y.o. [name] Primary Care Shaquinta Peruski: [name] Other Clinician: Referring Herberta Pickron: Treating Akaylah Lalley/Extender: Terence Lux in Treatment: 13 Edema Assessment Assessed: [Left: No] [Right: No] Edema: [Left: Yes] [Right: Yes] Calf Left: Right: Point of Measurement: 38 cm From Medial Instep 33.7 cm 31.5 cm Ankle Left: Right: Point of Measurement: 9 cm From Medial Instep 21.8 cm 22 cm Vascular Assessment Pulses: Dorsalis Pedis Palpable: [Left:Yes] [Right:Yes] Electronic Signature(s) Signed: 01/28/2021 5:10:58 PM By: Lorrin Jackson Signed: 01/28/2021 5:49:32 PM By: Deon Pilling Entered By: Lorrin Jackson on 01/28/2021 14:06:58 -------------------------------------------------------------------------------- Multi-Disciplinary Care Plan Details Patient Name: Date of Service: Jackie [name], [name] 01/28/2021 1:15 PM Medical Record Number: [id number] Patient Account Number: [id number] Date of Birth/Sex: Treating RN: September 02, 1932 (87 y.o. [name] Primary Care Zaleah Ternes: [name] Other Clinician: Referring Merlinda Wrubel: Treating Daphine Loch/Extender: Terence Lux in Treatment: [place] reviewed with physician Active Inactive Electronic Signature(s) Signed: 01/28/2021 5:49:32 PM By: Deon Pilling Entered By: Deon Pilling on 01/28/2021 15:45:01 -------------------------------------------------------------------------------- Non-Wound Condition Assessment Details Patient Name: Date of Service: Jackie [name] CE M. 01/28/2021 1:15 PM Medical Record Number: [id number] Patient Account Number: [id number] Date of Birth/Sex: Treating RN: 1932-04-18 (87 y.o. [name] Primary Care Karma Ansley: [name] Other Clinician: Referring  Harvard Zeiss: Treating Brace Welte/Extender: Terence Lux in Treatment: 13 Non-Wound Condition: Condition: Lymphedema Location: Leg Side: Bilateral Notes: leg ankles weeping Notes No weeping noted, dry flaky skin Electronic Signature(s) Signed: 01/28/2021 5:10:58 PM By: Lorrin Jackson Signed: 01/28/2021 5:49:32 PM By: Deon Pilling Entered By: Lorrin Jackson on 01/28/2021 14:07:28 -------------------------------------------------------------------------------- Pain Assessment Details Patient Name: Date of Service: Jackie [name], [name] 01/28/2021 1:15 PM Medical Record Number: [id number] Patient Account Number: [id number] Date of Birth/Sex: Treating RN: Dec 26, 1931 (87 y.o. [name] Primary Care Shaunita Seney: [name] Other Clinician: Referring Kaisen Ackers: Treating Romualdo Prosise/Extender: Terence Lux in Treatment: 13 Active Problems Location of Pain Severity and Description of Pain Patient Has Paino No Site Locations Pain Management and Medication Current Pain Management: Electronic Signature(s) Signed: 01/28/2021 5:30:41 PM By: Sandre Kitty Signed: 01/28/2021 5:49:32 PM By: Deon Pilling Entered By: Sandre Kitty on 01/28/2021 13:56:15 -------------------------------------------------------------------------------- Patient/Caregiver Education Details Patient Name: Date of  Service: Jackie [name], [name] 3/3/2022andnbsp1:15 PM Medical Record Number: [id number] Patient Account Number: [id number] Date of Birth/Gender: Treating RN: 10-Jun-1932 (87 y.o. [name] Primary Care Physician: [name] Other Clinician: Referring Physician: Treating Physician/Extender: Terence Lux in Treatment: 13 Education Assessment Education Provided To: Patient Education Topics Provided Wound/Skin Impairment: Handouts: Skin Care Do's and Dont's Methods: Explain/Verbal Responses: Reinforcements needed Electronic Signature(s) Signed: 01/28/2021 5:49:32 PM By: Deon Pilling Entered By: Deon Pilling on 01/28/2021 15:45:26 -------------------------------------------------------------------------------- Vitals Details Patient Name: Date of Service: Jackie [name], [name] 01/28/2021 1:15 PM Medical Record Number: [id number] Patient Account Number: [id number] Date of Birth/Sex: Treating RN: Apr 25, 1932 (87 y.o. Jackie [name], Jackie [name] Primary Care Jalila Goodnough: [name] Other Clinician: Referring Jahnessa Vanduyn: Treating Gretna Bergin/Extender: Terence Lux in Treatment: 13 Vital Signs Time Taken: 13:55 Temperature (F): 97.7 Height (in): 61 Pulse (bpm): 65 Weight (lbs): 126 Respiratory Rate (breaths/min): 18 Body Mass Index (BMI): 23.8 Blood Pressure (mmHg): 159/67 Reference Range: 80 - 120 mg / dl Electronic Signature(s) Signed: 01/28/2021 5:30:41 PM By: Sandre Kitty Entered By: Sandre Kitty on 01/28/2021 13:56:09

## 2021-01-28 NOTE — Progress Notes (Addendum)
Jackie, COMMON [name]. ([id number]) Visit Report for 01/28/2021 Chief Complaint Document Details Patient Name: Date of Service: Jackie [name] CE M. 01/28/2021 1:15 PM Medical Record Number: [id number] Patient Account Number: [id number] Date of Birth/Sex: Treating RN: 1932/02/04 (87 y.o. Jackie [name] Primary Care Provider: [name] Other Clinician: Referring Provider: Treating Provider/Extender: Terence Lux in Treatment: 13 Information Obtained from: Patient Chief Complaint 07/17/2020; patient is here for review of a wound on her left medial ankle and lower leg Electronic Signature(s) Signed: 01/28/2021 1:28:45 PM By: Worthy Keeler PA-C Entered By: Worthy Keeler on 01/28/2021 13:28:45 -------------------------------------------------------------------------------- HPI Details Patient Name: Date of Service: Jackie [name], Jackie CE M. 01/28/2021 1:15 PM Medical Record Number: [id number] Patient Account Number: [id number] Date of Birth/Sex: Treating RN: 05-Aug-1932 (87 y.o. Jackie [name] Primary Care Provider: [name] Other Clinician: Referring Provider: Treating Provider/Extender: Terence Lux in Treatment: 13 History of Present Illness HPI Description: ADMISSION 07/17/2020 This is an 87 year old woman who is referred by her nurse practitioner at [facility] who has been doing treatment for the wound on the right medial lower leg and ankle. Apparently 1 point a substantial wound however it is largely epithelialized now. Patient states its been there for about 5 or 6 weeks. On the background she has changes in the skin in this area I think the go back several years to when she had a left hip fracture. Nevertheless she does not have a wound history that I could elicit. As far as I know that they had been using TCA, Mepitel under an Unna boot they have been changing this weekly at [employer]. She also  received a course of doxycycline. Past medical history very little that I can find in [place] link. She has a history of venous insufficiency bilaterally and a left hip fracture hypertension and a history of thyroidectomy for thyroid cancer. ABI in our clinic was 1.13 on the left 8/27; the patient is substantial initial wound by her description on the left medial lower leg and ankle is completely epithelialized. She has chronic venous insufficiency, dermatosclerosis and probably lymphedema. She does not have stockings and states that she would have trouble getting over the toe stockings on in any fashion because of hip problems except 9/3; this is a patient with chronic venous insufficiency with a particularly difficult area on her left medial lower leg and ankle. My assessment of this is that she has chronic venous insufficiency and chronic venous hypertension. And at least on the left medial ankle stasis dermatitis. The area still looks angry and inflamed. Nevertheless it is epithelialized. I do not believe there is active infection. We have ordered her bilateral juxta lite stockings. These should be easier for her to put on but need to be adjusted to 30/40 compression. If this is not successful in maintaining this area I think she is going to need formal reflux studies to see if she might be a candidate for ablation of the greater saphenous vein 08/05/2020 upon evaluation today patient appears to be doing poorly in regard to her lower extremity. Unfortunately this has reopened after being healed on Friday. She tells me she was wearing her compression wrap but that even when she woke up in the morning on Saturday morning she was leaking. Fortunately there is no signs of active infection at this time systemically though there is some question of a thing locally. She does have juxta light  compression bilaterally. 08/12/2020 on evaluation today patient appears to be doing well with regard to her leg  ulcer which actually is closed again at this point. With that being said she is still having some issues currently with very thin skin that has come back over and again with her swelling if we do not wrap around afraid this is can open right back up. Nonetheless I do believe that she should proceed with the vascular testing next Thursday as previously ordered and recommended. Fortunately there is no signs of active infection at this time. 9/23; this is a patient I discharged a few weeks ago. Apparently she had a reopening on the left leg medially. This is closed now. We have ordered venous reflux studies but they are not until 10/5 at [place] 10/29/2020 Patient is a now 87 year old female. She is somewhat frail however still lives at home largely on her own. We had her in the clinic in August and September 2021 with a wound on the left medial lower extremity in the setting of chronic venous insufficiency and lymphedema. This closed reasonably easily and we discharged her to juxta lite stockings. After her discharge she had her venous reflux studies at interventional radiology. She was seen in consultation by Dr. Earleen Newport. She did not have evidence of thrombosis. She did have significant reflux of the bilateral greater saphenous vein on the right there was reflux from the saphenofemoral junction to the proximal calf where there is further decompression through network of varicosities on the left there was reflux from the saphenofemoral junction to the distal calf there is also a varicose network decompressing the calf. She was recommended I think for venous ablations of the greater saphenous vein on the left however she is wanting to defer that it into the new year. She tells me that very shortly after she left here the last time she was not able to put the juxta lite stocking on largely the inner sleeve. She developed swelling and then leakage and skin breakdown again in the left  medial lower extremity. She was seen by podiatry on 3 occasions I think they were applying Unna boots. Her son says that at one point the [facility] boot was not put on properly his wife who is a nurse had to adjust this and the swelling is actually somewhat better. She arrives in clinic today with very significant swelling of the proximal two thirds of her lower extremity erythema superficial skin breakdown on the left medial ankle and calf and weeping edema fluid. Past medical history not much change from last time she has chronic venous insufficiency with very significant reflux and lymphedema. She states that she has trouble bending over to get the inner part of the juxta lite stocking on because of hip problems. Her ABI in this clinic was not repeated but the last time she was here it was 1.13 she was not felt to have an arterial issue 12/9; the patient's wound on the left medial lower leg is again closed. She has chronic hemosiderin and stasis dermatitis left greater than right lipodermatosclerosis left greater than right. On the right leg today we are not wrapping she says she hit this on a screen door however there is no open area here. The right leg has a lot of swelling 12/21; this is a patient we discharged 10 days ago. She had bilateral juxta lite stockings and was supposed to be getting compression pumps from her son. According the patient her son tested  positive for Covid and has been on quarantine and she is unable to get the internal part of the juxta lites on properly. She therefore has weeping edema left greater than right medial and is back in clinic. 12/28. Patient comes in with no open areas on the right leg. She still has very inflamed tenderness on the left medial ankle and lower calf. This could be cellulitis or stasis dermatitis. Also similar changes on the left lateral 1/6; patient returns to clinic. We did not wrap her right leg last time we wrapped the left. I gave her a course  of antibiotics because of tenderness on the left medial ankle possible cellulitis versus severe stasis dermatitis. Everything is taken care of on the left where the 3 layer compression was. Unfortunately on the right she has inflamed edema on the right medial ankle and right lateral ankle. Poorly controlled edema. She Kaven with her juxta lite stocking on but it is not wrapped tightly enough to control the swelling. 1/13; she comes back in today after a week of compression wraps on all her wounds are healed. Her skin is dry and flaky she has stasis dermatitis of predominantly in the right medial greater than left medial lower leg extending into her foot on the right. She has a bag of stockings and related apparel including juxta lite stockings, over the toe compression stockings, a sock Donner. She is for 1 reason or another not able to get these things on at home on her own she comes in today with her daughter from [place]. She got external compression pumps delivered the other day but has not started to use them. The other major problem is she lives alone 12/23/2020 upon evaluation today patient unfortunately presents for readmission due to issues that she is having with weeping of her lower extremities along with increased discomfort and pain. She also has increased redness. I am concerned about infection here among other things. She has not been using the compression as she tells me that this is not easy for her to do. She is also not been using her lymphedema pumps enough to even know "whether they would work or not" 2/3; patient came into the clinic last week with concern of cellulitis increasing edema with weeping. She was put on doxycycline she comes in today with dry skin once again but no wounds and no evidence of infection. She probably had stasis dermatitis. She is here with her daughter. As usual we will long discussion about how were going to arrange to have the patient put stockings  on. Since she was last here she had a fall is apparently fractured L3 in the lower thoracic vertebrae. She has some discomfort. Certainly in this situation she could not be expected to put on compression stockings. She has not use the compression pumps we ordered either. 2/17; we discharge the patient 2 weeks ago with bilateral juxta lite stockings. We also suggested in-home care in the morning to help her get the stockings on with the options of being perhaps assisted living or going to live in [place] with her daughter however she is done none of the above. She called the clinic multiple times to report weeping edema. She states today than on Monday, 3 days ago her sheets were soaked with fluid when she woke up in the morning. 2/24; patient once again today looks fine. There is no swelling no weeping edema and no wounds. She brought in her stockings. 01/28/2021 upon evaluation today patient's wounds  actually are showing signs of being completely healed I do not see anything open she does have a lot of dry skin at this point unfortunately. Fortunately there is no signs of active infection however and in general I feel like she is doing quite well. No fevers, chills, nausea, vomiting, or diarrhea. Electronic Signature(s) Signed: 01/28/2021 3:28:21 PM By: Worthy Keeler PA-C Entered By: Worthy Keeler on 01/28/2021 15:28:20 -------------------------------------------------------------------------------- Physical Exam Details Patient Name: Date of Service: Jackie [name], Jackie CE M. 01/28/2021 1:15 PM Medical Record Number: [id number] Patient Account Number: [id number] Date of Birth/Sex: Treating RN: [birth year] (87 y.o. Jackie [name] Primary Care Provider: [name] Other Clinician: Referring Provider: Treating Provider/Extender: Terence Lux in Treatment: 31 Constitutional Well-nourished and well-hydrated in no acute distress. Respiratory normal breathing without  difficulty. Psychiatric this patient is able to make decisions and demonstrates good insight into disease process. Alert and Oriented x 3. pleasant and cooperative. Notes Patient's wound bed showed signs of good granulation epithelization. There does not appear to be any evidence of active infection which is great news and overall very pleased with where things stand. Electronic Signature(s) Signed: 01/28/2021 3:28:35 PM By: Worthy Keeler PA-C Entered By: Worthy Keeler on 01/28/2021 15:28:35 -------------------------------------------------------------------------------- Physician Orders Details Patient Name: Date of Service: Jackie [name], Jackie CE M. 01/28/2021 1:15 PM Medical Record Number: [id number] Patient Account Number: [id number] Date of Birth/Sex: Treating RN: 1932/09/07 (87 y.o. Jackie [name] Primary Care Provider: [name] Other Clinician: Referring Provider: Treating Provider/Extender: Terence Lux in Treatment: 13 Verbal / Phone Orders: No Diagnosis Coding ICD-10 Coding Code Description [phone] Chronic venous hypertension (idiopathic) with ulcer and inflammation of left lower extremity I89.0 Lymphedema, not elsewhere classified L97.321 Non-pressure chronic ulcer of left ankle limited to breakdown of skin Discharge From [facility] Discharge from [place] - call if any future wound care needs. Edema Control - Lymphedema / SCD / Other Elevate legs to the level of the heart or above for 30 minutes daily and/or when sitting, a frequency of: - 3-4 times a day. Avoid standing for long periods of time. Exercise regularly Moisturize legs daily. - every night before bed. Compression stocking or Garment 20-30 mm/Hg pressure to: - juxtalite HD apply in the morning and remove at night. In clinic today apply lotion and juxtalite HD to both legs. Electronic Signature(s) Signed: 01/28/2021 5:31:07 PM By: Worthy Keeler PA-C Signed: 01/28/2021  5:49:32 PM By: Deon Pilling Entered By: Deon Pilling on 01/28/2021 14:44:35 -------------------------------------------------------------------------------- Problem List Details Patient Name: Date of Service: Jackie [name], Jackie CE M. 01/28/2021 1:15 PM Medical Record Number: [id number] Patient Account Number: [id number] Date of Birth/Sex: Treating RN: [birth year] (87 y.o. 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 01/28/2021 1:15 PM Medical Record Number: [id number] Patient Account Number: [id number] Date of Birth/Sex: Treating RN: 12-03-1931 (  87 y.o. Jackie [name] Primary Care Provider: [name] Other Clinician: Referring Provider: Treating Provider/Extender: Terence Lux in Treatment: 13 Subjective Chief Complaint Information obtained from Patient 07/17/2020; patient is here for review of a wound on her left medial ankle and lower leg History of Present Illness (HPI) ADMISSION 07/17/2020 This is an 87 year old woman who is referred by her nurse practitioner at [facility] who has been doing treatment for the wound on the right medial lower leg and  ankle. Apparently 1 point a substantial wound however it is largely epithelialized now. Patient states its been there for about 5 or 6 weeks. On the background she has changes in the skin in this area I think the go back several years to when she had a left hip fracture. Nevertheless she does not have a wound history that I could elicit. As far as I know that they had been using TCA, Mepitel under an Unna boot they have been changing this weekly at [facility] [employer]. She also received a course of doxycycline. Past medical history very little that I can find in [place] link. She has a history of venous insufficiency bilaterally and a left hip fracture hypertension and a history of thyroidectomy for thyroid cancer. ABI in our clinic was 1.13 on the left 8/27; the patient is substantial initial wound by her description on the left medial lower leg and ankle is completely epithelialized. She has chronic venous insufficiency, dermatosclerosis and probably lymphedema. She does not have stockings and states that she would have trouble getting over the toe stockings on in any fashion because of hip problems except 9/3; this is a patient with chronic venous insufficiency with a particularly difficult area on her left medial lower leg and ankle. My assessment of this is that she has chronic venous insufficiency and chronic venous hypertension. And at least on the left medial ankle stasis dermatitis. The area still looks angry and inflamed. Nevertheless it is epithelialized. I do not believe there is active infection. We have ordered her bilateral juxta lite stockings. These should be easier for her to put on but need to be adjusted to 30/40 compression. If this is not successful in maintaining this area I think she is going to need formal reflux studies to see if she might be a candidate for ablation of the greater saphenous vein 08/05/2020 upon evaluation today patient appears to be doing poorly  in regard to her lower extremity. Unfortunately this has reopened after being healed on Friday. She tells me she was wearing her compression wrap but that even when she woke up in the morning on Saturday morning she was leaking. Fortunately there is no signs of active infection at this time systemically though there is some question of a thing locally. She does have juxta light compression bilaterally. 08/12/2020 on evaluation today patient appears to be doing well with regard to her leg ulcer which actually is closed again at this point. With that being said she is still having some issues currently with very thin skin that has come back over and again with her swelling if we do not wrap around afraid this is can open right back up. Nonetheless I do believe that she should proceed with the vascular testing next Thursday as previously ordered and recommended. Fortunately there is no signs of active infection at this time. 9/23; this is a patient I discharged a few weeks ago. Apparently she had a reopening on the left  leg medially. This is closed now. We have ordered venous reflux studies but they are not until 10/5 at [place] 10/29/2020 Patient is a now 87 year old female. She is somewhat frail however still lives at home largely on her own. We had her in the clinic in August and September 2021 with a wound on the left medial lower extremity in the setting of chronic venous insufficiency and lymphedema. This closed reasonably easily and we discharged her to juxta lite stockings. After her discharge she had her venous reflux studies at interventional radiology. She was seen in consultation by Dr. Earleen Newport. She did not have evidence of thrombosis. She did have significant reflux of the bilateral greater saphenous vein on the right there was reflux from the saphenofemoral junction to the proximal calf where there is further decompression through network of varicosities on the left  there was reflux from the saphenofemoral junction to the distal calf there is also a varicose network decompressing the calf. She was recommended I think for venous ablations of the greater saphenous vein on the left however she is wanting to defer that it into the new year. She tells me that very shortly after she left here the last time she was not able to put the juxta lite stocking on largely the inner sleeve. She developed swelling and then leakage and skin breakdown again in the left medial lower extremity. She was seen by podiatry on 3 occasions I think they were applying Unna boots. Her son says that at one point the [facility] boot was not put on properly his wife who is a nurse had to adjust this and the swelling is actually somewhat better. She arrives in clinic today with very significant swelling of the proximal two thirds of her lower extremity erythema superficial skin breakdown on the left medial ankle and calf and weeping edema fluid. Past medical history not much change from last time she has chronic venous insufficiency with very significant reflux and lymphedema. She states that she has trouble bending over to get the inner part of the juxta lite stocking on because of hip problems. Her ABI in this clinic was not repeated but the last time she was here it was 1.13 she was not felt to have an arterial issue 12/9; the patient's wound on the left medial lower leg is again closed. She has chronic hemosiderin and stasis dermatitis left greater than right lipodermatosclerosis left greater than right. On the right leg today we are not wrapping she says she hit this on a screen door however there is no open area here. The right leg has a lot of swelling 12/21; this is a patient we discharged 10 days ago. She had bilateral juxta lite stockings and was supposed to be getting compression pumps from her son. According the patient her son tested positive for Covid and has been on quarantine and she  is unable to get the internal part of the juxta lites on properly. She therefore has weeping edema left greater than right medial and is back in clinic. 12/28. Patient comes in with no open areas on the right leg. She still has very inflamed tenderness on the left medial ankle and lower calf. This could be cellulitis or stasis dermatitis. Also similar changes on the left lateral 1/6; patient returns to clinic. We did not wrap her right leg last time we wrapped the left. I gave her a course of antibiotics because of tenderness on the left medial ankle possible cellulitis versus  severe stasis dermatitis. Everything is taken care of on the left where the 3 layer compression was. Unfortunately on the right she has inflamed edema on the right medial ankle and right lateral ankle. Poorly controlled edema. She Kaven with her juxta lite stocking on but it is not wrapped tightly enough to control the swelling. 1/13; she comes back in today after a week of compression wraps on all her wounds are healed. Her skin is dry and flaky she has stasis dermatitis of predominantly in the right medial greater than left medial lower leg extending into her foot on the right. She has a bag of stockings and related apparel including juxta lite stockings, over the toe compression stockings, a sock Donner. She is for 1 reason or another not able to get these things on at home on her own she comes in today with her daughter from [place]. She got external compression pumps delivered the other day but has not started to use them. The other major problem is she lives alone 12/23/2020 upon evaluation today patient unfortunately presents for readmission due to issues that she is having with weeping of her lower extremities along with increased discomfort and pain. She also has increased redness. I am concerned about infection here among other things. She has not been using the compression as she tells me that this is not easy for  her to do. She is also not been using her lymphedema pumps enough to even know "whether they would work or not" 2/3; patient came into the clinic last week with concern of cellulitis increasing edema with weeping. She was put on doxycycline she comes in today with dry skin once again but no wounds and no evidence of infection. She probably had stasis dermatitis. She is here with her daughter. As usual we will long discussion about how were going to arrange to have the patient put stockings on. Since she was last here she had a fall is apparently fractured L3 in the lower thoracic vertebrae. She has some discomfort. Certainly in this situation she could not be expected to put on compression stockings. She has not use the compression pumps we ordered either. 2/17; we discharge the patient 2 weeks ago with bilateral juxta lite stockings. We also suggested in-home care in the morning to help her get the stockings on with the options of being perhaps assisted living or going to live in [place] with her daughter however she is done none of the above. She called the clinic multiple times to report weeping edema. She states today than on Monday, 3 days ago her sheets were soaked with fluid when she woke up in the morning. 2/24; patient once again today looks fine. There is no swelling no weeping edema and no wounds. She brought in her stockings. 01/28/2021 upon evaluation today patient's wounds actually are showing signs of being completely healed I do not see anything open she does have a lot of dry skin at this point unfortunately. Fortunately there is no signs of active infection however and in general I feel like she is doing quite well. No fevers, chills, nausea, vomiting, or diarrhea. Objective Constitutional Well-nourished and well-hydrated in no acute distress. Vitals Time Taken: 1:55 PM, Height: 61 in, Weight: 126 lbs, BMI: 23.8, Temperature: 97.7 F, Pulse: 65 bpm, Respiratory Rate: 18  breaths/min, Blood Pressure: 159/67 mmHg. Respiratory normal breathing without difficulty. Psychiatric this patient is able to make decisions and demonstrates good insight into disease process. Alert and Oriented x 3.  pleasant and cooperative. General Notes: Patient's wound bed showed signs of good granulation epithelization. There does not appear to be any evidence of active infection which is great news and overall very pleased with where things stand. Other Condition(s) Patient presents with Lymphedema located on the Bilateral Leg. General Notes: No weeping noted, dry flaky skin Assessment Active Problems ICD-10 Chronic venous hypertension (idiopathic) with ulcer and inflammation of left lower extremity Lymphedema, not elsewhere classified Non-pressure chronic ulcer of left ankle limited to breakdown of skin Plan Discharge From [facility]: Discharge from [facility] - call if any future wound care needs. Edema Control - Lymphedema / SCD / Other: Elevate legs to the level of the heart or above for 30 minutes daily and/or when sitting, a frequency of: - 3-4 times a day. Avoid standing for long periods of time. Exercise regularly Moisturize legs daily. - every night before bed. Compression stocking or Garment 20-30 mm/Hg pressure to: - juxtalite HD apply in the morning and remove at night. In clinic today apply lotion and juxtalite HD to both legs. 1. Would recommend currently that we go ahead and have the patient discontinue wound care services she feels like she can get the compression socks on at home she has a juxta light she feels like she can put on both the black stocking as well as the outer compression. If she is able to do this I think it will keep things under control and hopefully she will not have to come back for regular visits here. Otherwise I think were probably can have to just initiate a regimen of wrapping her here in the clinic possibly seeing her monthly  from her bladder visits and then subsequently in between have a nurse visits once a week. 2. Fortunately all the wounds are healed which is great news and I do think she is to continue to elevate her legs much as possible when she is sitting. We will see her back for follow-up visit as needed. Electronic Signature(s) Signed: 01/28/2021 3:29:27 PM By: Worthy Keeler PA-C Entered By: Worthy Keeler on 01/28/2021 15:29:27 -------------------------------------------------------------------------------- SuperBill Details Patient Name: Date of Service: Jackie [name], Jackie CE M. 01/28/2021 Medical Record Number: [id number] Patient Account Number: [id number] Date of Birth/Sex: Treating RN: [birth year] (87 y.o. Jackie [name] Primary Care Provider: [name] Other Clinician: Referring Provider: Treating Provider/Extender: Terence Lux in Treatment: 13 Diagnosis Coding ICD-10 Codes Code Description [phone] Chronic venous hypertension (idiopathic) with ulcer and inflammation of left lower extremity I89.0 Lymphedema, not elsewhere classified L97.321 Non-pressure chronic ulcer of left ankle limited to breakdown of skin Facility Procedures CPT4 Code: [id number] Description: [phone] - WOUND CARE VISIT-LEV 2 EST PT Modifier: Quantity: 1 Physician Procedures : CPT4 Code Description Modifier [id number] - WC PHYS LEVEL 3 - EST PT ICD-10 Diagnosis Description I87.332 Chronic venous hypertension (idiopathic) with ulcer and inflammation of left lower extremity I89.0 Lymphedema, not elsewhere classified L97.321  Non-pressure chronic ulcer of left ankle limited to breakdown of skin Quantity: 1 Electronic Signature(s) Signed: 01/28/2021 5:31:07 PM By: Worthy Keeler PA-C Signed: 01/28/2021 5:49:32 PM By: Deon Pilling Previous Signature: 01/28/2021 3:29:37 PM Version By: Worthy Keeler PA-C Entered By: Deon Pilling on 01/28/2021 15:45:56

## 2021-02-01 ENCOUNTER — Ambulatory Visit (HOSPITAL_COMMUNITY)
Admission: RE | Admit: 2021-02-01 | Discharge: 2021-02-01 | Disposition: A | Discharge status: Home / Self Care | Payer: Medicare Other | Source: Ambulatory Visit | Attending: Endocrinology | Admitting: Endocrinology

## 2021-02-01 ENCOUNTER — Other Ambulatory Visit: Payer: Self-pay

## 2021-02-01 DIAGNOSIS — M810 Age-related osteoporosis without current pathological fracture: Secondary | ICD-10-CM | POA: Diagnosis present

## 2021-02-01 MED ORDER — DENOSUMAB 60 MG/ML ~~LOC~~ SOSY
PREFILLED_SYRINGE | SUBCUTANEOUS | Status: AC
  Administered 2021-02-01: 60 mg via SUBCUTANEOUS
  Filled 2021-02-01: qty 1

## 2021-02-01 MED ORDER — DENOSUMAB 60 MG/ML ~~LOC~~ SOSY: 60.0000 mg | PREFILLED_SYRINGE | Freq: Once | SUBCUTANEOUS | Status: AC

## 2021-02-16 ENCOUNTER — Other Ambulatory Visit: Payer: Self-pay

## 2021-02-16 ENCOUNTER — Encounter (HOSPITAL_BASED_OUTPATIENT_CLINIC_OR_DEPARTMENT_OTHER): Payer: Medicare Other | Admitting: Internal Medicine

## 2021-02-16 DIAGNOSIS — I87332 Chronic venous hypertension (idiopathic) with ulcer and inflammation of left lower extremity: Secondary | ICD-10-CM | POA: Diagnosis not present

## 2021-02-16 NOTE — Progress Notes (Signed)
MALIJAH, LIETZ McDowell. ([id number]) Visit Report for 02/16/2021 HPI Details Patient Name: Date of Service: [name] CE M. 02/16/2021 2:30 PM Medical Record Number: [id number] Patient Account Number: [id number] Date of Birth/Sex: Treating RN: 01-14-1932 (87 y.o. [name], Lauren Primary Care Provider: [name] Other Clinician: Referring Provider: Treating Provider/Extender: [name] in Treatment: 15 History of Present Illness HPI Description: ADMISSION 07/17/2020 This is an 87 year old woman who is referred by her nurse practitioner at [facility] who has been doing treatment for the wound on the right medial lower leg and ankle. Apparently 1 point a substantial wound however it is largely epithelialized now. Patient states its been there for about 5 or 6 weeks. On the background she has changes in the skin in this area I think the go back several years to when she had a left hip fracture. Nevertheless she does not have a wound history that I could elicit. As far as I know that they had been using TCA, Mepitel under an Unna boot they have been changing this weekly at [employer] [facility] [employer]. She also received a course of doxycycline. Past medical history very little that I can find in [place] link. She has a history of venous insufficiency bilaterally and a left hip fracture hypertension and a history of thyroidectomy for thyroid cancer. ABI in our clinic was 1.13 on the left 8/27; the patient is substantial initial wound by her description on the left medial lower leg and ankle is completely epithelialized. She has chronic venous insufficiency, dermatosclerosis and probably lymphedema. She does not have stockings and states that she would have trouble getting over the toe stockings on in any fashion because of hip problems except 9/3; this is a patient with chronic venous insufficiency with a particularly difficult area on her left  medial lower leg and ankle. My assessment of this is that she has chronic venous insufficiency and chronic venous hypertension. And at least on the left medial ankle stasis dermatitis. The area still looks angry and inflamed. Nevertheless it is epithelialized. I do not believe there is active infection. We have ordered her bilateral juxta lite stockings. These should be easier for her to put on but need to be adjusted to 30/40 compression. If this is not successful in maintaining this area I think she is going to need formal reflux studies to see if she might be a candidate for ablation of the greater saphenous vein 08/05/2020 upon evaluation today patient appears to be doing poorly in regard to her lower extremity. Unfortunately this has reopened after being healed on Friday. She tells me she was wearing her compression wrap but that even when she woke up in the morning on Saturday morning she was leaking. Fortunately there is no signs of active infection at this time systemically though there is some question of a thing locally. She does have juxta light compression bilaterally. 08/12/2020 on evaluation today patient appears to be doing well with regard to her leg ulcer which actually is closed again at this point. With that being said she is still having some issues currently with very thin skin that has come back over and again with her swelling if we do not wrap around afraid this is can open right back up. Nonetheless I do believe that she should proceed with the vascular testing next Thursday as previously ordered and recommended. Fortunately there is no signs of active infection at this time. 9/23; this is a patient I  discharged a few weeks ago. Apparently she had a reopening on the left leg medially. This is closed now. We have ordered venous reflux studies but they are not until 10/5 at [place] 10/29/2020 Patient is a now 87 year old female. She is somewhat frail  however still lives at home largely on her own. We had her in the clinic in August and September 2021 with a wound on the left medial lower extremity in the setting of chronic venous insufficiency and lymphedema. This closed reasonably easily and we discharged her to juxta lite stockings. After her discharge she had her venous reflux studies at interventional radiology. She was seen in consultation by Dr. Earleen Newport. She did not have evidence of thrombosis. She did have significant reflux of the bilateral greater saphenous vein on the right there was reflux from the saphenofemoral junction to the proximal calf where there is further decompression through network of varicosities on the left there was reflux from the saphenofemoral junction to the distal calf there is also a varicose network decompressing the calf. She was recommended I think for venous ablations of the greater saphenous vein on the left however she is wanting to defer that it into the new year. She tells me that very shortly after she left here the last time she was not able to put the juxta lite stocking on largely the inner sleeve. She developed swelling and then leakage and skin breakdown again in the left medial lower extremity. She was seen by podiatry on 3 occasions I think they were applying Unna boots. Her son says that at one point the [facility] boot was not put on properly his wife who is a nurse had to adjust this and the swelling is actually somewhat better. She arrives in clinic today with very significant swelling of the proximal two thirds of her lower extremity erythema superficial skin breakdown on the left medial ankle and calf and weeping edema fluid. Past medical history not much change from last time she has chronic venous insufficiency with very significant reflux and lymphedema. She states that she has trouble bending over to get the inner part of the juxta lite stocking on because of hip problems. Her ABI in this clinic  was not repeated but the last time she was here it was 1.13 she was not felt to have an arterial issue 12/9; the patient's wound on the left medial lower leg is again closed. She has chronic hemosiderin and stasis dermatitis left greater than right lipodermatosclerosis left greater than right. On the right leg today we are not wrapping she says she hit this on a screen door however there is no open area here. The right leg has a lot of swelling 12/21; this is a patient we discharged 10 days ago. She had bilateral juxta lite stockings and was supposed to be getting compression pumps from her son. According the patient her son tested positive for Covid and has been on quarantine and she is unable to get the internal part of the juxta lites on properly. She therefore has weeping edema left greater than right medial and is back in clinic. 12/28. Patient comes in with no open areas on the right leg. She still has very inflamed tenderness on the left medial ankle and lower calf. This could be cellulitis or stasis dermatitis. Also similar changes on the left lateral 1/6; patient returns to clinic. We did not wrap her right leg last time we wrapped the left. I gave her a course  of antibiotics because of tenderness on the left medial ankle possible cellulitis versus severe stasis dermatitis. Everything is taken care of on the left where the 3 layer compression was. Unfortunately on the right she has inflamed edema on the right medial ankle and right lateral ankle. Poorly controlled edema. She [place] with her juxta lite stocking on but it is not wrapped tightly enough to control the swelling. 1/13; she comes back in today after a week of compression wraps on all her wounds are healed. Her skin is dry and flaky she has stasis dermatitis of predominantly in the right medial greater than left medial lower leg extending into her foot on the right. She has a bag of stockings and related apparel including juxta lite  stockings, over the toe compression stockings, a sock Donner. She is for 1 reason or another not able to get these things on at home on her own she comes in today with her daughter from [place]. She got external compression pumps delivered the other day but has not started to use them. The other major problem is she lives alone 12/23/2020 upon evaluation today patient unfortunately presents for readmission due to issues that she is having with weeping of her lower extremities along with increased discomfort and pain. She also has increased redness. I am concerned about infection here among other things. She has not been using the compression as she tells me that this is not easy for her to do. She is also not been using her lymphedema pumps enough to even know "whether they would work or not" 2/3; patient came into the clinic last week with concern of cellulitis increasing edema with weeping. She was put on doxycycline she comes in today with dry skin once again but no wounds and no evidence of infection. She probably had stasis dermatitis. She is here with her daughter. As usual we will long discussion about how were going to arrange to have the patient put stockings on. Since she was last here she had a fall is apparently fractured L3 in the lower thoracic vertebrae. She has some discomfort. Certainly in this situation she could not be expected to put on compression stockings. She has not use the compression pumps we ordered either. 2/17; we discharge the patient 2 weeks ago with bilateral juxta lite stockings. We also suggested in-home care in the morning to help her get the stockings on with the options of being perhaps assisted living or going to live in [place] with her daughter however she is done none of the above. She called the clinic multiple times to report weeping edema. She states today than on Monday, 3 days ago her sheets were soaked with fluid when she woke up in the morning. 2/24;  patient once again today looks fine. There is no swelling no weeping edema and no wounds. She brought in her stockings. 01/28/2021 upon evaluation today patient's wounds actually are showing signs of being completely healed I do not see anything open she does have a lot of dry skin at this point unfortunately. Fortunately there is no signs of active infection however and in general I feel like she is doing quite well. No fevers, chills, nausea, vomiting, or diarrhea. 02/16/2021; patient was discharged almost 3 weeks ago. She is back in clinic today again with weeping areas on the left lateral lower leg and on the right lower leg. Severe stasis dermatitis poorly controlled edema. She apparently has been more compliant with her juxta lites although I  did not actually see these on and I am not certain that they were on with enough pressure. She does not complain of pain just the leaking fluid out of the areas "it Electronic Signature(s) Signed: 02/16/2021 5:50:43 PM By: Linton Ham MD Entered By: Linton Ham on 02/16/2021 17:37:45 -------------------------------------------------------------------------------- Physical Exam Details Patient Name: Date of Service: [name], [place]. 02/16/2021 2:30 PM Medical Record Number: [id number] Patient Account Number: [id number] Date of Birth/Sex: Treating RN: 1932/11/22 (87 y.o. Benjaman Lobe Primary Care Provider: [name] Other Clinician: Referring Provider: Treating Provider/Extender: [name] in Treatment: 15 Constitutional Sitting or standing Blood Pressure is within target range for patient.. Pulse regular and within target range for patient.Marland Kitchen Respirations regular, non-labored and within target range.. Temperature is normal and within the target range for the patient.Marland Kitchen Appears in no distress. Respiratory work of breathing is normal. Cardiovascular Pedal pulses are palpable. Psychiatric Patient appears  depressed today.. Notes Wound exam; the patient has weeping areas on the left lateral lower leg and then the right lower leg. Very significant poorly controlled stasis dermatitis likely secondary to poorly controlled edema. Electronic Signature(s) Signed: 02/16/2021 5:50:43 PM By: Linton Ham MD Entered By: Linton Ham on 02/16/2021 17:38:46 -------------------------------------------------------------------------------- Physician Orders Details Patient Name: Date of Service: [name], [place]. 02/16/2021 2:30 PM Medical Record Number: [id number] Patient Account Number: [id number] Date of Birth/Sex: Treating RN: 11/16/1932 (87 y.o. Benjaman Lobe Primary Care Provider: [name] Other Clinician: Referring Provider: Treating Provider/Extender: [name] in Treatment: 15 Verbal / Phone Orders: No Diagnosis Coding Follow-up Appointments Return Appointment in 1 week. Bathing/ Shower/ Hygiene May shower with protection but do not get wound dressing(s) wet. Edema Control - Lymphedema / SCD / Other Lymphedema Pumps. Use Lymphedema pumps on leg(s) 2-3 times a day for 45-60 minutes. If wearing any wraps or hose, do not remove them. Continue exercising as instructed. Elevate legs to the level of the heart or above for 30 minutes daily and/or when sitting, a frequency of: - 3-4 times a day. Avoid standing for long periods of time. Exercise regularly Moisturize legs daily. - every night before bed. Wound Treatment Wound #3 - Lower Leg Wound Laterality: Right Cleanser: Soap and Water 1 x Per Week/15 Days Discharge Instructions: May shower and wash wound with dial antibacterial soap and water prior to dressing change. Cleanser: Wound Cleanser 1 x Per Week/15 Days Discharge Instructions: Cleanse the wound with wound cleanser prior to applying a clean dressing using gauze sponges, not tissue or cotton balls. Peri-Wound Care: Triamcinolone 15 (g) 1 x  Per Week/15 Days Discharge Instructions: Use triamcinolone 15 (g) as directed Peri-Wound Care: Sween Lotion (Moisturizing lotion) 1 x Per Week/15 Days Discharge Instructions: Apply moisturizing lotion as directed Prim Dressing: KerraCel Ag Gelling Fiber Dressing, 4x5 in (silver alginate) 1 x Per Week/15 Days ary Discharge Instructions: Apply silver alginate to wound bed as instructed Secondary Dressing: Woven Gauze Sponge, Non-Sterile 4x4 in 1 x Per Week/15 Days Discharge Instructions: Apply over primary dressing as directed. Secondary Dressing: ABD Pad, 5x9 1 x Per Week/15 Days Discharge Instructions: Apply over primary dressing as directed. Compression Wrap: ThreePress (3 layer compression wrap) 1 x Per Week/15 Days Discharge Instructions: Apply three layer compression as directed. Compression Stockings: Circaid Juxta Lite Compression Wrap (DME) Right Leg Compression Amount: 30-40 mmHG Discharge Instructions: Apply Circaid Juxta Lite Compression Wrap daily as instructed. Apply first thing in the morning, remove at night before  bed. Wound #4 - Lower Leg Wound Laterality: Left, Lateral Cleanser: Soap and Water 1 x Per Week/15 Days Discharge Instructions: May shower and wash wound with dial antibacterial soap and water prior to dressing change. Cleanser: Wound Cleanser 1 x Per Week/15 Days Discharge Instructions: Cleanse the wound with wound cleanser prior to applying a clean dressing using gauze sponges, not tissue or cotton balls. Peri-Wound Care: Triamcinolone 15 (g) 1 x Per Week/15 Days Discharge Instructions: Use triamcinolone 15 (g) as directed Peri-Wound Care: Sween Lotion (Moisturizing lotion) 1 x Per Week/15 Days Discharge Instructions: Apply moisturizing lotion as directed Prim Dressing: KerraCel Ag Gelling Fiber Dressing, 4x5 in (silver alginate) ary 1 x Per Week/15 Days Discharge Instructions: Apply silver alginate to wound bed as instructed Secondary Dressing: Woven Gauze  Sponge, Non-Sterile 4x4 in 1 x Per Week/15 Days Discharge Instructions: Apply over primary dressing as directed. Secondary Dressing: ABD Pad, 5x9 1 x Per Week/15 Days Discharge Instructions: Apply over primary dressing as directed. Compression Wrap: ThreePress (3 layer compression wrap) 1 x Per Week/15 Days Discharge Instructions: Apply three layer compression as directed. Compression Stockings: Circaid Juxta Lite Compression Wrap Left Leg Compression Amount: 30-40 mmHG Discharge Instructions: Apply Circaid Juxta Lite Compression Wrap daily as instructed. Apply first thing in the morning, remove at night before bed. Electronic Signature(s) Signed: 02/16/2021 5:23:06 PM By: Rhae Hammock RN Signed: 02/16/2021 5:50:43 PM By: Linton Ham MD Entered By: Rhae Hammock on 02/16/2021 16:02:27 -------------------------------------------------------------------------------- Problem List Details Patient Name: Date of Service: [name], [place]. 02/16/2021 2:30 PM Medical Record Number: [id number] Patient Account Number: [id number] Date of Birth/Sex: Treating RN: 04-Apr-1932 (87 y.o. [name], Lauren Primary Care Provider: [name] Other Clinician: Referring Provider: Treating Provider/Extender: [name] in Treatment: 15 Active Problems ICD-10 Encounter Code Description Active Date MDM Diagnosis I87.332 Chronic venous hypertension (idiopathic) with ulcer and inflammation of left 10/29/2020 No Yes lower extremity I89.0 Lymphedema, not elsewhere classified 10/29/2020 No Yes L97.321 Non-pressure chronic ulcer of left ankle limited to breakdown of skin 10/29/2020 No Yes Inactive Problems Resolved Problems Electronic Signature(s) Signed: 02/16/2021 5:50:43 PM By: Linton Ham MD Entered By: Linton Ham on 02/16/2021 17:36:27 -------------------------------------------------------------------------------- Progress Note Details Patient Name:  Date of Service: [name], [place]. 02/16/2021 2:30 PM Medical Record Number: [id number] Patient Account Number: [id number] Date of Birth/Sex: Treating RN: Apr 03, 1932 (87 y.o. [name], Lauren Primary Care Provider: [name] Other Clinician: Referring Provider: Treating Provider/Extender: [name] in Treatment: 15 Subjective History of Present Illness (HPI) ADMISSION 07/17/2020 This is an 87 year old woman who is referred by her nurse practitioner at [facility] who has been doing treatment for the wound on the right medial lower leg and ankle. Apparently 1 point a substantial wound however it is largely epithelialized now. Patient states its been there for about 5 or 6 weeks. On the background she has changes in the skin in this area I think the go back several years to when she had a left hip fracture. Nevertheless she does not have a wound history that I could elicit. As far as I know that they had been using TCA, Mepitel under an Unna boot they have been changing this weekly at [facility] [employer]. She also received a course of doxycycline. Past medical history very little that I can find in [place] [address] link. She has a history of venous insufficiency bilaterally and a left hip fracture hypertension and a history of thyroidectomy for  thyroid cancer. ABI in our clinic was 1.13 on the left 8/27; the patient is substantial initial wound by her description on the left medial lower leg and ankle is completely epithelialized. She has chronic venous insufficiency, dermatosclerosis and probably lymphedema. She does not have stockings and states that she would have trouble getting over the toe stockings on in any fashion because of hip problems except 9/3; this is a patient with chronic venous insufficiency with a particularly difficult area on her left medial lower leg and ankle. My assessment of this is that she has chronic venous  insufficiency and chronic venous hypertension. And at least on the left medial ankle stasis dermatitis. The area still looks angry and inflamed. Nevertheless it is epithelialized. I do not believe there is active infection. We have ordered her bilateral juxta lite stockings. These should be easier for her to put on but need to be adjusted to 30/40 compression. If this is not successful in maintaining this area I think she is going to need formal reflux studies to see if she might be a candidate for ablation of the greater saphenous vein 08/05/2020 upon evaluation today patient appears to be doing poorly in regard to her lower extremity. Unfortunately this has reopened after being healed on Friday. She tells me she was wearing her compression wrap but that even when she woke up in the morning on Saturday morning she was leaking. Fortunately there is no signs of active infection at this time systemically though there is some question of a thing locally. She does have juxta light compression bilaterally. 08/12/2020 on evaluation today patient appears to be doing well with regard to her leg ulcer which actually is closed again at this point. With that being said she is still having some issues currently with very thin skin that has come back over and again with her swelling if we do not wrap around afraid this is can open right back up. Nonetheless I do believe that she should proceed with the vascular testing next Thursday as previously ordered and recommended. Fortunately there is no signs of active infection at this time. 9/23; this is a patient I discharged a few weeks ago. Apparently she had a reopening on the left leg medially. This is closed now. We have ordered venous reflux studies but they are not until 10/5 at [place] 10/29/2020 Patient is a now 87 year old female. She is somewhat frail however still lives at home largely on her own. We had her in the clinic in August and  September 2021 with a wound on the left medial lower extremity in the setting of chronic venous insufficiency and lymphedema. This closed reasonably easily and we discharged her to juxta lite stockings. After her discharge she had her venous reflux studies at interventional radiology. She was seen in consultation by Dr. Earleen Newport. She did not have evidence of thrombosis. She did have significant reflux of the bilateral greater saphenous vein on the right there was reflux from the saphenofemoral junction to the proximal calf where there is further decompression through network of varicosities on the left there was reflux from the saphenofemoral junction to the distal calf there is also a varicose network decompressing the calf. She was recommended I think for venous ablations of the greater saphenous vein on the left however she is wanting to defer that it into the new year. She tells me that very shortly after she left here the last time she was not able to put the juxta  lite stocking on largely the inner sleeve. She developed swelling and then leakage and skin breakdown again in the left medial lower extremity. She was seen by podiatry on 3 occasions I think they were applying Unna boots. Her son says that at one point the [facility] boot was not put on properly his wife who is a nurse had to adjust this and the swelling is actually somewhat better. She arrives in clinic today with very significant swelling of the proximal two thirds of her lower extremity erythema superficial skin breakdown on the left medial ankle and calf and weeping edema fluid. Past medical history not much change from last time she has chronic venous insufficiency with very significant reflux and lymphedema. She states that she has trouble bending over to get the inner part of the juxta lite stocking on because of hip problems. Her ABI in this clinic was not repeated but the last time she was here it was 1.13 she was not felt to have an  arterial issue 12/9; the patient's wound on the left medial lower leg is again closed. She has chronic hemosiderin and stasis dermatitis left greater than right lipodermatosclerosis left greater than right. On the right leg today we are not wrapping she says she hit this on a screen door however there is no open area here. The right leg has a lot of swelling 12/21; this is a patient we discharged 10 days ago. She had bilateral juxta lite stockings and was supposed to be getting compression pumps from her son. According the patient her son tested positive for Covid and has been on quarantine and she is unable to get the internal part of the juxta lites on properly. She therefore has weeping edema left greater than right medial and is back in clinic. 12/28. Patient comes in with no open areas on the right leg. She still has very inflamed tenderness on the left medial ankle and lower calf. This could be cellulitis or stasis dermatitis. Also similar changes on the left lateral 1/6; patient returns to clinic. We did not wrap her right leg last time we wrapped the left. I gave her a course of antibiotics because of tenderness on the left medial ankle possible cellulitis versus severe stasis dermatitis. Everything is taken care of on the left where the 3 layer compression was. Unfortunately on the right she has inflamed edema on the right medial ankle and right lateral ankle. Poorly controlled edema. She [place] with her juxta lite stocking on but it is not wrapped tightly enough to control the swelling. 1/13; she comes back in today after a week of compression wraps on all her wounds are healed. Her skin is dry and flaky she has stasis dermatitis of predominantly in the right medial greater than left medial lower leg extending into her foot on the right. She has a bag of stockings and related apparel including juxta lite stockings, over the toe compression stockings, a sock Donner. She is for 1 reason or  another not able to get these things on at home on her own she comes in today with her daughter from [place]. She got external compression pumps delivered the other day but has not started to use them. The other major problem is she lives alone 12/23/2020 upon evaluation today patient unfortunately presents for readmission due to issues that she is having with weeping of her lower extremities along with increased discomfort and pain. She also has increased redness. I am concerned about infection here among  other things. She has not been using the compression as she tells me that this is not easy for her to do. She is also not been using her lymphedema pumps enough to even know "whether they would work or not" 2/3; patient came into the clinic last week with concern of cellulitis increasing edema with weeping. She was put on doxycycline she comes in today with dry skin once again but no wounds and no evidence of infection. She probably had stasis dermatitis. She is here with her daughter. As usual we will long discussion about how were going to arrange to have the patient put stockings on. Since she was last here she had a fall is apparently fractured L3 in the lower thoracic vertebrae. She has some discomfort. Certainly in this situation she could not be expected to put on compression stockings. She has not use the compression pumps we ordered either. 2/17; we discharge the patient 2 weeks ago with bilateral juxta lite stockings. We also suggested in-home care in the morning to help her get the stockings on with the options of being perhaps assisted living or going to live in [place] with her daughter however she is done none of the above. She called the clinic multiple times to report weeping edema. She states today than on Monday, 3 days ago her sheets were soaked with fluid when she woke up in the morning. 2/24; patient once again today looks fine. There is no swelling no weeping edema and no  wounds. She brought in her stockings. 01/28/2021 upon evaluation today patient's wounds actually are showing signs of being completely healed I do not see anything open she does have a lot of dry skin at this point unfortunately. Fortunately there is no signs of active infection however and in general I feel like she is doing quite well. No fevers, chills, nausea, vomiting, or diarrhea. 02/16/2021; patient was discharged almost 3 weeks ago. She is back in clinic today again with weeping areas on the left lateral lower leg and on the right lower leg. Severe stasis dermatitis poorly controlled edema. She apparently has been more compliant with her juxta lites although I did not actually see these on and I am not certain that they were on with enough pressure. She does not complain of pain just the leaking fluid out of the areas "it Objective Constitutional Sitting or standing Blood Pressure is within target range for patient.. Pulse regular and within target range for patient.Marland Kitchen Respirations regular, non-labored and within target range.. Temperature is normal and within the target range for the patient.Marland Kitchen Appears in no distress. Vitals Time Taken: 2:39 PM, Height: 61 in, Weight: 126 lbs, BMI: 23.8, Temperature: 97.5 F, Pulse: 74 bpm, Respiratory Rate: 18 breaths/min, Blood Pressure: 132/86 mmHg. Respiratory work of breathing is normal. Cardiovascular Pedal pulses are palpable. Psychiatric Patient appears depressed today.. General Notes: Wound exam; the patient has weeping areas on the left lateral lower leg and then the right lower leg. Very significant poorly controlled stasis dermatitis likely secondary to poorly controlled edema. Integumentary (Hair, Skin) Wound #3 status is Open. Original cause of wound was Gradually Appeared. The date acquired was: 02/11/2021. The wound is located on the Right Lower Leg. The wound measures 3.5cm length x 7cm width x 0.1cm depth; 19.242cm^2 area and 1.924cm^3  volume. The wound is limited to skin breakdown. There is no tunneling or undermining noted. There is a large amount of serous drainage noted. The wound margin is indistinct and nonvisible. There is  no granulation within the wound bed. There is no necrotic tissue within the wound bed. Wound #4 status is Open. Original cause of wound was Gradually Appeared. The date acquired was: 02/11/2021. The wound is located on the Left,Lateral Lower Leg. The wound measures 4.3cm length x 4.3cm width x 0.1cm depth; 14.522cm^2 area and 1.452cm^3 volume. There is no tunneling or undermining noted. There is a large amount of serous drainage noted. The wound margin is indistinct and nonvisible. There is no granulation within the wound bed. There is no necrotic tissue within the wound bed. Assessment Active Problems ICD-10 Chronic venous hypertension (idiopathic) with ulcer and inflammation of left lower extremity Lymphedema, not elsewhere classified Non-pressure chronic ulcer of left ankle limited to breakdown of skin Procedures Wound #3 Pre-procedure diagnosis of Wound #3 is a Lymphedema located on the Right Lower Leg . There was a Three Layer Compression Therapy Procedure by Rhae Hammock, RN. Post procedure Diagnosis Wound #3: Same as Pre-Procedure Wound #4 Pre-procedure diagnosis of Wound #4 is a Lymphedema located on the Left,Lateral Lower Leg . There was a Three Layer Compression Therapy Procedure by Rhae Hammock, RN. Post procedure Diagnosis Wound #4: Same as Pre-Procedure Plan Follow-up Appointments: Return Appointment in 1 week. Bathing/ Shower/ Hygiene: May shower with protection but do not get wound dressing(s) wet. Edema Control - Lymphedema / SCD / Other: Lymphedema Pumps. Use Lymphedema pumps on leg(s) 2-3 times a day for 45-60 minutes. If wearing any wraps or hose, do not remove them. Continue exercising as instructed. Elevate legs to the level of the heart or above for 30 minutes  daily and/or when sitting, a frequency of: - 3-4 times a day. Avoid standing for long periods of time. Exercise regularly Moisturize legs daily. - every night before bed. WOUND #3: - Lower Leg Wound Laterality: Right Cleanser: Soap and Water 1 x Per Week/15 Days Discharge Instructions: May shower and wash wound with dial antibacterial soap and water prior to dressing change. Cleanser: Wound Cleanser 1 x Per Week/15 Days Discharge Instructions: Cleanse the wound with wound cleanser prior to applying a clean dressing using gauze sponges, not tissue or cotton balls. Peri-Wound Care: Triamcinolone 15 (g) 1 x Per Week/15 Days Discharge Instructions: Use triamcinolone 15 (g) as directed Peri-Wound Care: Sween Lotion (Moisturizing lotion) 1 x Per Week/15 Days Discharge Instructions: Apply moisturizing lotion as directed Prim Dressing: KerraCel Ag Gelling Fiber Dressing, 4x5 in (silver alginate) 1 x Per Week/15 Days ary Discharge Instructions: Apply silver alginate to wound bed as instructed Secondary Dressing: Woven Gauze Sponge, Non-Sterile 4x4 in 1 x Per Week/15 Days Discharge Instructions: Apply over primary dressing as directed. Secondary Dressing: ABD Pad, 5x9 1 x Per Week/15 Days Discharge Instructions: Apply over primary dressing as directed. Com pression Wrap: ThreePress (3 layer compression wrap) 1 x Per Week/15 Days Discharge Instructions: Apply three layer compression as directed. Com pression Stockings: Circaid Juxta Lite Compression Wrap (DME) Compression Amount: 30-40 mmHg (right) Discharge Instructions: Apply Circaid Juxta Lite Compression Wrap daily as instructed. Apply first thing in the morning, remove at night before bed. WOUND #4: - Lower Leg Wound Laterality: Left, Lateral Cleanser: Soap and Water 1 x Per Week/15 Days Discharge Instructions: May shower and wash wound with dial antibacterial soap and water prior to dressing change. Cleanser: Wound Cleanser 1 x Per Week/15  Days Discharge Instructions: Cleanse the wound with wound cleanser prior to applying a clean dressing using gauze sponges, not tissue or cotton balls. Peri-Wound Care: Triamcinolone 15 (g) 1 x  Per Week/15 Days Discharge Instructions: Use triamcinolone 15 (g) as directed Peri-Wound Care: Sween Lotion (Moisturizing lotion) 1 x Per Week/15 Days Discharge Instructions: Apply moisturizing lotion as directed Prim Dressing: KerraCel Ag Gelling Fiber Dressing, 4x5 in (silver alginate) 1 x Per Week/15 Days ary Discharge Instructions: Apply silver alginate to wound bed as instructed Secondary Dressing: Woven Gauze Sponge, Non-Sterile 4x4 in 1 x Per Week/15 Days Discharge Instructions: Apply over primary dressing as directed. Secondary Dressing: ABD Pad, 5x9 1 x Per Week/15 Days Discharge Instructions: Apply over primary dressing as directed. Com pression Wrap: ThreePress (3 layer compression wrap) 1 x Per Week/15 Days Discharge Instructions: Apply three layer compression as directed. Com pression Stockings: Circaid Juxta Lite Compression Wrap Compression Amount: 30-40 mmHg (left) Discharge Instructions: Apply Circaid Juxta Lite Compression Wrap daily as instructed. Apply first thing in the morning, remove at night before bed. 1. We are going to put silver alginate ABDs on the weeping areas and put her in 3 layer compression bilaterally 2. Liberal TCA to the stasis dermatitis 3. Her daughter was present and asked [place] to order her new juxta lite stockings we went ahead with this 4. It is difficult to break the cycle that this patient is in. I do not think she is able to get even her external compression stockings on well enough to prevent fluid accumulation. She will not allow help in her home Electronic Signature(s) Signed: 02/16/2021 5:50:43 PM By: Linton Ham MD Entered By: Linton Ham on 02/16/2021  17:39:52 -------------------------------------------------------------------------------- SuperBill Details Patient Name: Date of Service: [name], [place]. 02/16/2021 Medical Record Number: [id number] Patient Account Number: [id number] Date of Birth/Sex: Treating RN: [birth year] (87 y.o. [name], Lauren Primary Care Provider: [name] Other Clinician: Referring Provider: Treating Provider/Extender: [name] in Treatment: 15 Diagnosis Coding ICD-10 Codes Code Description [phone] Chronic venous hypertension (idiopathic) with ulcer and inflammation of left lower extremity I89.0 Lymphedema, not elsewhere classified L97.321 Non-pressure chronic ulcer of left ankle limited to breakdown of skin Facility Procedures CPT4: Code [id number] Description: 81 BILATERAL: Application of multi-layer venous compression system; leg (below knee), including ankle and t. Modifier: Quantity: 1 Physician Procedures : CPT4 Code Description Modifier [id number] - WC PHYS LEVEL 3 - EST PT ICD-10 Diagnosis Description I87.332 Chronic venous hypertension (idiopathic) with ulcer and inflammation of left lower extremity I89.0 Lymphedema, not elsewhere classified L97.321  Non-pressure chronic ulcer of left ankle limited to breakdown of skin Quantity: 1 Electronic Signature(s) Signed: 02/16/2021 5:50:43 PM By: Linton Ham MD Previous Signature: 02/16/2021 5:23:06 PM Version By: Rhae Hammock RN Entered By: Linton Ham on 02/16/2021 17:40:16

## 2021-02-17 NOTE — Progress Notes (Signed)
Jackie [name], Jackie [name] [facility]. ([id number]) Visit Report for 02/16/2021 Arrival Information Details Patient Name: Date of Service: Jackie [name] CE M. 02/16/2021 2:30 PM Medical Record Number: [id number] Patient Account Number: [id number] Date of Birth/Sex: Treating RN: 1932-08-24 (87 y.o. [name], Lauren Primary Care Kassy Mcenroe: [name] Other Clinician: Referring Lamone Ferrelli: Treating Zairah Arista/Extender: [name] in Treatment: 15 Visit Information History Since Last Visit Added or deleted any medications: No Patient Arrived: Wheel Chair Any new allergies or adverse reactions: No Arrival Time: 14:39 Had a fall or experienced change in No Accompanied By: daughter activities of daily living that may affect Transfer Assistance: None risk of falls: Patient Identification Verified: Yes Signs or symptoms of abuse/neglect since last visito No Secondary Verification Process Completed: Yes Hospitalized since last visit: No Patient Requires Transmission-Based Precautions: No Implantable device outside of the clinic excluding No Patient Has Alerts: Yes cellular tissue based products placed in the center Patient Alerts: ABI: L 1.13 07/2020 since last visit: Has Dressing in Place as Prescribed: Yes Pain Present Now: No Electronic Signature(s) Signed: 02/17/2021 7:45:09 AM By: Sandre Kitty Entered By: Sandre Kitty on 02/16/2021 14:39:40 -------------------------------------------------------------------------------- Compression Therapy Details Patient Name: Date of Service: Jackie [name], [place]. 02/16/2021 2:30 PM Medical Record Number: [id number] Patient Account Number: [id number] Date of Birth/Sex: Treating RN: 10-20-1932 (87 y.o. Benjaman Lobe Primary Care Aarini Slee: [name] Other Clinician: Referring Efstathios Sawin: Treating Emilija Bohman/Extender: [name] in Treatment: 15 Compression Therapy Performed for Wound Assessment: Wound  #3 Right Lower Leg Performed By: Clinician Rhae Hammock, RN Compression Type: Three Layer Post Procedure Diagnosis Same as Pre-procedure Electronic Signature(s) Signed: 02/16/2021 5:23:06 PM By: Rhae Hammock RN Entered By: Rhae Hammock on 02/16/2021 16:01:47 -------------------------------------------------------------------------------- Compression Therapy Details Patient Name: Date of Service: Jackie [name], [place]. 02/16/2021 2:30 PM Medical Record Number: [id number] Patient Account Number: [id number] Date of Birth/Sex: Treating RN: [birth year] (87 y.o. Benjaman Lobe Primary Care Lowry Bala: [name] Other Clinician: Referring Meko Masterson: Treating Amariana Mirando/Extender: [name] in Treatment: 15 Compression Therapy Performed for Wound Assessment: Wound #4 Left,Lateral Lower Leg Performed By: Clinician Rhae Hammock, RN Compression Type: Three Layer Post Procedure Diagnosis Same as Pre-procedure Electronic Signature(s) Signed: 02/16/2021 5:23:06 PM By: Rhae Hammock RN Entered By: Rhae Hammock on 02/16/2021 16:01:48 -------------------------------------------------------------------------------- Encounter Discharge Information Details Patient Name: Date of Service: Jackie [name], [place]. 02/16/2021 2:30 PM Medical Record Number: [id number] Patient Account Number: [id number] Date of Birth/Sex: Treating RN: [birth year] (87 y.o. [name] Primary Care Rachard Isidro: [name] Other Clinician: Referring Khadijah Mastrianni: Treating Sulamita Lafountain/Extender: [name] in Treatment: 15 Encounter Discharge Information Items Discharge Condition: Stable Ambulatory Status: Wheelchair Discharge Destination: Home Transportation: Private Auto Accompanied By: Daughter Schedule Follow-up Appointment: Yes Clinical Summary of Care: Provided on 02/16/2021 Form Type Recipient Paper Patient Patient Electronic  Signature(s) Signed: 02/16/2021 4:03:10 PM By: Lorrin Jackson Previous Signature: 02/16/2021 4:02:47 PM Version By: Lorrin Jackson Entered By: Lorrin Jackson on 02/16/2021 16:03:10 -------------------------------------------------------------------------------- Lower Extremity Assessment Details Patient Name: Date of Service: Jackie [name], [place]. 02/16/2021 2:30 PM Medical Record Number: [id number] Patient Account Number: [id number] Date of Birth/Sex: Treating RN: Jun 17, 1932 (87 y.o. [name] Primary Care Sreekar Broyhill: [name] Other Clinician: Referring Ludia Gartland: Treating Ellawyn Wogan/Extender: [name] in Treatment: 15 Edema Assessment Assessed: [name]: No] [Right: No] Edema: [Left: Yes] [Right: Yes] Calf Left: Right: Point of Measurement: 38 cm From Medial Instep 30.4 cm 34  cm Ankle Left: Right: Point of Measurement: 9 cm From Medial Instep 22.2 cm 22.5 cm Vascular Assessment Pulses: Dorsalis Pedis Palpable: [Left:Yes] [Right:Yes] Electronic Signature(s) Signed: 02/16/2021 5:27:17 PM By: Baruch Gouty RN, BSN Entered By: Baruch Gouty on 02/16/2021 14:49:43 -------------------------------------------------------------------------------- Multi Wound Chart Details Patient Name: Date of Service: Jackie [name], [place]. 02/16/2021 2:30 PM Medical Record Number: [id number] Patient Account Number: [id number] Date of Birth/Sex: Treating RN: [birth year] (87 y.o. [name], [place] Primary Care Arjuna Doeden: [name] Other Clinician: Referring Halia Franey: Treating Keerat Denicola/Extender: [name] in Treatment: 15 Vital Signs Height(in): 61 Pulse(bpm): 27 Weight(lbs): 126 Blood Pressure(mmHg): 132/86 Body Mass Index(BMI): 24 Temperature(F): 97.5 Respiratory Rate(breaths/min): 18 Photos: [N/A:N/A] Right Lower Leg Left, Lateral Lower Leg N/A Wound Location: Gradually Appeared Gradually Appeared N/A Wounding  Event: Lymphedema Lymphedema N/A Primary Etiology: Venous Leg Ulcer Venous Leg Ulcer N/A Secondary Etiology: Hypertension Hypertension N/A Comorbid History: 02/11/2021 02/11/2021 N/A Date Acquired: 0 0 N/A Weeks of Treatment: Open Open N/A Wound Status: 3.5x7x0.1 4.3x4.3x0.1 N/A Measurements L x W x D (cm) 19.242 14.522 N/A A (cm) : rea 1.924 1.452 N/A Volume (cm) : 0.00% 0.00% N/A % Reduction in A rea: 0.00% 0.00% N/A % Reduction in Volume: Partial Thickness Partial Thickness N/A Classification: Large Large N/A Exudate A mount: Serous Serous N/A Exudate Type: amber amber N/A Exudate Color: Indistinct, nonvisible Indistinct, nonvisible N/A Wound Margin: None Present (0%) None Present (0%) N/A Granulation A mount: None Present (0%) None Present (0%) N/A Necrotic A mount: Fascia: No Fascia: No N/A Exposed Structures: Fat Layer (Subcutaneous Tissue): No Fat Layer (Subcutaneous Tissue): No Tendon: No Tendon: No Muscle: No Muscle: No Joint: No Joint: No Bone: No Bone: No Limited to Skin Breakdown Small (1-33%) Medium (34-66%) N/A Epithelialization: Compression Therapy Compression Therapy N/A Procedures Performed: Treatment Notes Wound #3 (Lower Leg) Wound Laterality: Right Cleanser Soap and Water Discharge Instruction: May shower and wash wound with dial antibacterial soap and water prior to dressing change. Wound Cleanser Discharge Instruction: Cleanse the wound with wound cleanser prior to applying a clean dressing using gauze sponges, not tissue or cotton balls. Peri-Wound Care Triamcinolone 15 (g) Discharge Instruction: Use triamcinolone 15 (g) as directed Sween Lotion (Moisturizing lotion) Discharge Instruction: Apply moisturizing lotion as directed Topical Primary Dressing KerraCel Ag Gelling Fiber Dressing, 4x5 in (silver alginate) Discharge Instruction: Apply silver alginate to wound bed as instructed Secondary Dressing Woven Gauze Sponge,  Non-Sterile 4x4 in Discharge Instruction: Apply over primary dressing as directed. ABD Pad, 5x9 Discharge Instruction: Apply over primary dressing as directed. Secured With Compression Wrap ThreePress (3 layer compression wrap) Discharge Instruction: Apply three layer compression as directed. Compression Stockings Circaid Juxta Lite Compression Wrap Quantity: 1 Right Leg Compression Amount: 30-40 mmHg Discharge Instruction: Apply Circaid Juxta Lite Compression Wrap daily as instructed. Apply first thing in the morning, remove at night before bed. Add-Ons Wound #4 (Lower Leg) Wound Laterality: Left, Lateral Cleanser Soap and Water Discharge Instruction: May shower and wash wound with dial antibacterial soap and water prior to dressing change. Wound Cleanser Discharge Instruction: Cleanse the wound with wound cleanser prior to applying a clean dressing using gauze sponges, not tissue or cotton balls. Peri-Wound Care Triamcinolone 15 (g) Discharge Instruction: Use triamcinolone 15 (g) as directed Sween Lotion (Moisturizing lotion) Discharge Instruction: Apply moisturizing lotion as directed Topical Primary Dressing KerraCel Ag Gelling Fiber Dressing, 4x5 in (silver alginate) Discharge Instruction: Apply silver alginate to wound bed as instructed Secondary Dressing Woven Gauze Sponge, Non-Sterile 4x4  in Discharge Instruction: Apply over primary dressing as directed. ABD Pad, 5x9 Discharge Instruction: Apply over primary dressing as directed. Secured With Compression Wrap ThreePress (3 layer compression wrap) Discharge Instruction: Apply three layer compression as directed. Compression Stockings Circaid Juxta Lite Compression Wrap Quantity: 1 Left Leg Compression Amount: 30-40 mmHg Discharge Instruction: Apply Circaid Juxta Lite Compression Wrap daily as instructed. Apply first thing in the morning, remove at night before bed. Add-Ons Electronic Signature(s) Signed:  02/16/2021 5:50:43 PM By: Linton Ham MD Signed: 02/17/2021 5:50:40 PM By: Rhae Hammock RN Entered By: Linton Ham on 02/16/2021 17:36:36 -------------------------------------------------------------------------------- Multi-Disciplinary Care Plan Details Patient Name: Date of Service: Jackie [name], [place]. 02/16/2021 2:30 PM Medical Record Number: [id number] Patient Account Number: [id number] Date of Birth/Sex: Treating RN: 06/17/1932 (87 y.o. [name], Lauren Primary Care Monna Crean: [name] Other Clinician: Referring Kimiye Strathman: Treating Cariana Karge/Extender: [name] in Treatment: [place] reviewed with physician Active Inactive Orientation to the Wound Care Program Nursing Diagnoses: Knowledge deficit related to the wound healing center program Goals: Patient/caregiver will verbalize understanding of the [facility] Program Date Initiated: 10/29/2020 Target Resolution Date: 02/27/2021 Goal Status: Active Interventions: Provide education on orientation to the wound center Notes: Electronic Signature(s) Signed: 02/16/2021 5:23:06 PM By: Rhae Hammock RN Entered By: Rhae Hammock on 02/16/2021 16:01:13 -------------------------------------------------------------------------------- Pain Assessment Details Patient Name: Date of Service: Jackie [name], [place]. 02/16/2021 2:30 PM Medical Record Number: [id number] Patient Account Number: [id number] Date of Birth/Sex: Treating RN: 1932/07/16 (87 y.o. [name], Lauren Primary Care Jamien Casanova: [name] Other Clinician: Referring Frankie Zito: Treating Copeland Neisen/Extender: [name] in Treatment: 15 Active Problems Location of Pain Severity and Description of Pain Patient Has Paino No Site Locations Pain Management and Medication Current Pain Management: Electronic Signature(s) Signed: 02/16/2021 5:23:06 PM By: Rhae Hammock RN Signed: 02/17/2021 7:45:09 AM By: Sandre Kitty Entered By: Sandre Kitty on 02/16/2021 14:40:03 -------------------------------------------------------------------------------- Patient/Caregiver Education Details Patient Name: Date of Service: Jackie [name], [place]. 3/22/2022andnbsp2:30 PM Medical Record Number: [id number] Patient Account Number: [id number] Date of Birth/Gender: Treating RN: May 12, 1932 (87 y.o. Benjaman Lobe Primary Care Physician: [name] Other Clinician: Referring Physician: Treating Physician/Extender: [name] in Treatment: 15 Education Assessment Education Provided To: Patient Education Topics Provided Basic Hygiene: Methods: Explain/Verbal Responses: State content correctly Electronic Signature(s) Signed: 02/16/2021 5:23:06 PM By: Rhae Hammock RN Entered By: Rhae Hammock on 02/16/2021 16:01:27 -------------------------------------------------------------------------------- Wound Assessment Details Patient Name: Date of Service: Jackie [name], [place]. 02/16/2021 2:30 PM Medical Record Number: [id number] Patient Account Number: [id number] Date of Birth/Sex: Treating RN: 02/24/1932 (87 y.o. [name] Primary Care Shaniqua Guillot: [name] Other Clinician: Referring Jeramie Scogin: Treating Tavaughn Silguero/Extender: [name] in Treatment: 15 Wound Status Wound Number: 3 Primary Etiology: Lymphedema Wound Location: Right Lower Leg Secondary Etiology: Venous Leg Ulcer Wounding Event: Gradually Appeared Wound Status: Open Date Acquired: 02/11/2021 Comorbid History: Hypertension Weeks Of Treatment: 0 Clustered Wound: No Photos Wound Measurements Length: (cm) 3.5 Width: (cm) 7 Depth: (cm) 0.1 Area: (cm) 19.242 Volume: (cm) 1.924 % Reduction in Area: 0% % Reduction in Volume: 0% Epithelialization: Small (1-33%) Tunneling: No Undermining: No Wound  Description Classification: Partial Thickness Wound Margin: Indistinct, nonvisible Exudate Amount: Large Exudate Type: Serous Exudate Color: amber Foul Odor After Cleansing: No Slough/Fibrino No Wound Bed Granulation Amount: None Present (0%) Exposed Structure Necrotic Amount: None Present (0%) Fascia Exposed: No Fat Layer (Subcutaneous Tissue)  Exposed: No Tendon Exposed: No Muscle Exposed: No Joint Exposed: No Bone Exposed: No Limited to Skin Breakdown Treatment Notes Wound #3 (Lower Leg) Wound Laterality: Right Cleanser Soap and Water Discharge Instruction: May shower and wash wound with dial antibacterial soap and water prior to dressing change. Wound Cleanser Discharge Instruction: Cleanse the wound with wound cleanser prior to applying a clean dressing using gauze sponges, not tissue or cotton balls. Peri-Wound Care Triamcinolone 15 (g) Discharge Instruction: Use triamcinolone 15 (g) as directed Sween Lotion (Moisturizing lotion) Discharge Instruction: Apply moisturizing lotion as directed Topical Primary Dressing KerraCel Ag Gelling Fiber Dressing, 4x5 in (silver alginate) Discharge Instruction: Apply silver alginate to wound bed as instructed Secondary Dressing Woven Gauze Sponge, Non-Sterile 4x4 in Discharge Instruction: Apply over primary dressing as directed. ABD Pad, 5x9 Discharge Instruction: Apply over primary dressing as directed. Secured With Compression Wrap ThreePress (3 layer compression wrap) Discharge Instruction: Apply three layer compression as directed. Compression Stockings Circaid Juxta Lite Compression Wrap Quantity: 1 Right Leg Compression Amount: 30-40 mmHg Discharge Instruction: Apply Circaid Juxta Lite Compression Wrap daily as instructed. Apply first thing in the morning, remove at night before bed. Add-Ons Electronic Signature(s) Signed: 02/16/2021 5:27:17 PM By: Baruch Gouty RN, BSN Signed: 02/17/2021 7:45:09 AM By: Sandre Kitty Entered By: Sandre Kitty on 02/16/2021 16:06:57 -------------------------------------------------------------------------------- Wound Assessment Details Patient Name: Date of Service: Jackie [name], [name] 02/16/2021 2:30 PM Medical Record Number: [id number] Patient Account Number: [id number] Date of Birth/Sex: Treating RN: 1932-02-15 (87 y.o. [name] Primary Care Foday Cone: [name] Other Clinician: Referring Mardi Cannady: Treating Tianna Baus/Extender: [name] in Treatment: 15 Wound Status Wound Number: 4 Primary Etiology: Lymphedema Wound Location: Left, Lateral Lower Leg Secondary Etiology: Venous Leg Ulcer Wounding Event: Gradually Appeared Wound Status: Open Date Acquired: 02/11/2021 Comorbid History: Hypertension Weeks Of Treatment: 0 Clustered Wound: No Photos Wound Measurements Length: (cm) 4.3 Width: (cm) 4.3 Depth: (cm) 0.1 Area: (cm) 14.522 Volume: (cm) 1.452 % Reduction in Area: 0% % Reduction in Volume: 0% Epithelialization: Medium (34-66%) Tunneling: No Undermining: No Wound Description Classification: Partial Thickness Wound Margin: Indistinct, nonvisible Exudate Amount: Large Exudate Type: Serous Exudate Color: amber Foul Odor After Cleansing: No Slough/Fibrino No Wound Bed Granulation Amount: None Present (0%) Exposed Structure Necrotic Amount: None Present (0%) Fascia Exposed: No Fat Layer (Subcutaneous Tissue) Exposed: No Tendon Exposed: No Muscle Exposed: No Joint Exposed: No Bone Exposed: No Treatment Notes Wound #4 (Lower Leg) Wound Laterality: Left, Lateral Cleanser Soap and Water Discharge Instruction: May shower and wash wound with dial antibacterial soap and water prior to dressing change. Wound Cleanser Discharge Instruction: Cleanse the wound with wound cleanser prior to applying a clean dressing using gauze sponges, not tissue or cotton balls. Peri-Wound Care Triamcinolone 15  (g) Discharge Instruction: Use triamcinolone 15 (g) as directed Sween Lotion (Moisturizing lotion) Discharge Instruction: Apply moisturizing lotion as directed Topical Primary Dressing KerraCel Ag Gelling Fiber Dressing, 4x5 in (silver alginate) Discharge Instruction: Apply silver alginate to wound bed as instructed Secondary Dressing Woven Gauze Sponge, Non-Sterile 4x4 in Discharge Instruction: Apply over primary dressing as directed. ABD Pad, 5x9 Discharge Instruction: Apply over primary dressing as directed. Secured With Compression Wrap ThreePress (3 layer compression wrap) Discharge Instruction: Apply three layer compression as directed. Compression Stockings Circaid Juxta Lite Compression Wrap Quantity: 1 Left Leg Compression Amount: 30-40 mmHg Discharge Instruction: Apply Circaid Juxta Lite Compression Wrap daily as instructed. Apply first thing in the morning, remove at night before bed. Add-Ons  Electronic Signature(s) Signed: 02/16/2021 5:27:17 PM By: Baruch Gouty RN, BSN Signed: 02/17/2021 7:45:09 AM By: Sandre Kitty Entered By: Sandre Kitty on 02/16/2021 16:07:32 -------------------------------------------------------------------------------- [place] Details Patient Name: Date of Service: Jackie [name], [place]. 02/16/2021 2:30 PM Medical Record Number: [id number] Patient Account Number: [id number] Date of Birth/Sex: Treating RN: 12/24/1931 (87 y.o. [name], Lauren Primary Care Letzy Gullickson: [name] Other Clinician: Referring Christain Mcraney: Treating Colleena Kurtenbach/Extender: [name] in Treatment: 15 Vital Signs Time Taken: 14:39 Temperature (F): 97.5 Height (in): 61 Pulse (bpm): 74 Weight (lbs): 126 Respiratory Rate (breaths/min): 18 Body Mass Index (BMI): 23.8 Blood Pressure (mmHg): 132/86 Reference Range: 80 - 120 mg / dl Electronic Signature(s) Signed: 02/17/2021 7:45:09 AM By: Sandre Kitty Entered By: Sandre Kitty on 02/16/2021 14:39:57

## 2021-02-23 ENCOUNTER — Other Ambulatory Visit: Payer: Self-pay

## 2021-02-23 ENCOUNTER — Encounter (HOSPITAL_BASED_OUTPATIENT_CLINIC_OR_DEPARTMENT_OTHER): Payer: Medicare Other | Admitting: Internal Medicine

## 2021-02-23 DIAGNOSIS — I87332 Chronic venous hypertension (idiopathic) with ulcer and inflammation of left lower extremity: Secondary | ICD-10-CM | POA: Diagnosis not present

## 2021-02-24 NOTE — Progress Notes (Addendum)
CINNAMON, MORENCY [place]. ([id number]) Visit Report for 02/23/2021 Arrival Information Details Patient Name: Date of Service: Jackie [name] CE M. 02/23/2021 2:30 PM Medical Record Number: [id number] Patient Account Number: [id number] Date of Birth/Sex: Treating RN: November 14, 1932 (87 y.o. Jackie [name], Lauren Primary Care Nevea Spiewak: [name] Other Clinician: Referring Atharva Mirsky: Treating  Wenzlick/Extender: [name] in Treatment: 7 Visit Information History Since Last Visit Added or deleted any medications: No Patient Arrived: Jackie [name] Any new allergies or adverse reactions: No Arrival Time: 14:47 Had a fall or experienced change in No Accompanied By: daughter activities of daily living that may affect Transfer Assistance: None risk of falls: Patient Identification Verified: Yes Signs or symptoms of abuse/neglect since last visito No Secondary Verification Process Completed: Yes Hospitalized since last visit: No Patient Requires Transmission-Based Precautions: No Implantable device outside of the clinic excluding No Patient Has Alerts: Yes cellular tissue based products placed in the center Patient Alerts: ABI: L 1.13 07/2020 since last visit: Has Dressing in Place as Prescribed: Yes Pain Present Now: No Electronic Signature(s) Signed: 02/24/2021 9:05:34 AM By: Sandre Kitty Entered By: Sandre Kitty on 02/23/2021 14:48:58 -------------------------------------------------------------------------------- Clinic Level of Care Assessment Details Patient Name: Date of Service: Jackie [name] CE M. 02/23/2021 2:30 PM Medical Record Number: [id number] Patient Account Number: [id number] Date of Birth/Sex: Treating RN: Nov 06, 1932 (87 y.o. Jackie [name], [name] Primary Care Joquan Lotz: [name] Other Clinician: Referring Koltin Wehmeyer: Treating Jaleia Hanke/Extender: [name] in Treatment: 16 Clinic Level of Care Assessment Items TOOL 4  Quantity Score X- 1 0 Use when only an EandM is performed on FOLLOW-UP visit ASSESSMENTS - Nursing Assessment / Reassessment X- 1 10 Reassessment of Co-morbidities (includes updates in patient status) X- 1 5 Reassessment of Adherence to Treatment Plan ASSESSMENTS - Wound and Skin A ssessment / Reassessment X - Simple Wound Assessment / Reassessment - one wound 1 5 []  - 0 Complex Wound Assessment / Reassessment - multiple wounds X- 1 10 Dermatologic / Skin Assessment (not related to wound area) ASSESSMENTS - Focused Assessment X- 1 5 Circumferential Edema Measurements - multi extremities []  - 0 Nutritional Assessment / Counseling / Intervention []  - 0 Lower Extremity Assessment (monofilament, tuning fork, pulses) []  - 0 Peripheral Arterial Disease Assessment (using hand held doppler) ASSESSMENTS - Ostomy and/or Continence Assessment and Care []  - 0 Incontinence Assessment and Management []  - 0 Ostomy Care Assessment and Management (repouching, etc.) PROCESS - Coordination of Care X - Simple Patient / Family Education for ongoing care 1 15 []  - 0 Complex (extensive) Patient / Family Education for ongoing care X- 1 10 Staff obtains Programmer, systems, Records, T Results / Process Orders est []  - 0 Staff telephones HHA, Nursing Homes / Clarify orders / etc []  - 0 Routine Transfer to another Facility (non-emergent condition) []  - 0 Routine Hospital Admission (non-emergent condition) []  - 0 New Admissions / Biomedical engineer / Ordering NPWT Apligraf, etc. , []  - 0 Emergency Hospital Admission (emergent condition) X- 1 10 Simple Discharge Coordination []  - 0 Complex (extensive) Discharge Coordination PROCESS - Special Needs []  - 0 Pediatric / Minor Patient Management []  - 0 Isolation Patient Management []  - 0 Hearing / Language / Visual special needs []  - 0 Assessment of Community assistance (transportation, D/C planning, etc.) []  - 0 Additional assistance / Altered  mentation []  - 0 Support Surface(s) Assessment (bed, cushion, seat, etc.) INTERVENTIONS - Wound Cleansing / Measurement []  - 0 Simple Wound Cleansing - one wound X- 2  5 Complex Wound Cleansing - multiple wounds X- 1 5 Wound Imaging (photographs - any number of wounds) []  - 0 Wound Tracing (instead of photographs) []  - 0 Simple Wound Measurement - one wound X- 2 5 Complex Wound Measurement - multiple wounds INTERVENTIONS - Wound Dressings []  - 0 Small Wound Dressing one or multiple wounds []  - 0 Medium Wound Dressing one or multiple wounds []  - 0 Large Wound Dressing one or multiple wounds []  - 0 Application of Medications - topical []  - 0 Application of Medications - injection INTERVENTIONS - Miscellaneous []  - 0 External ear exam []  - 0 Specimen Collection (cultures, biopsies, blood, body fluids, etc.) []  - 0 Specimen(s) / Culture(s) sent or taken to Lab for analysis []  - 0 Patient Transfer (multiple staff / Civil Service fast streamer / Similar devices) []  - 0 Simple Staple / Suture removal (25 or less) []  - 0 Complex Staple / Suture removal (26 or more) []  - 0 Hypo / Hyperglycemic Management (close monitor of Blood Glucose) []  - 0 Ankle / Brachial Index (ABI) - do not check if billed separately X- 1 5 Vital Signs Has the patient been seen at the hospital within the last three years: Yes Total Score: 100 Level Of Care: New/Established - Level 3 Electronic Signature(s) Signed: 02/23/2021 5:10:40 PM By: Rhae Hammock RN Entered By: Rhae Hammock on 02/23/2021 15:24:53 -------------------------------------------------------------------------------- Encounter Discharge Information Details Patient Name: Date of Service: Jackie [name], [place] M. 02/23/2021 2:30 PM Medical Record Number: [id number] Patient Account Number: [id number] Date of Birth/Sex: Treating RN: 06/02/1932 (87 y.o. [name] Primary Care Burel Kahre: [name] Other Clinician: Referring  Jayma Volpi: Treating Taevin Mcferran/Extender: [name] in Treatment: 16 Encounter Discharge Information Items Discharge Condition: Stable Ambulatory Status: Cane Discharge Destination: Home Transportation: Private Auto Accompanied By: Daughter Schedule Follow-up Appointment: Yes Clinical Summary of Care: Provided on 02/23/2021 Form Type Recipient Paper Patient Patient Electronic Signature(s) Signed: 02/23/2021 3:26:53 PM By: Lorrin Jackson Entered By: Lorrin Jackson on 02/23/2021 15:26:53 -------------------------------------------------------------------------------- Lower Extremity Assessment Details Patient Name: Date of Service: Jackie [name], [place]. 02/23/2021 2:30 PM Medical Record Number: [id number] Patient Account Number: [id number] Date of Birth/Sex: Treating RN: 11-25-1932 (87 y.o. Jackie [name], Lauren Primary Care Neve Branscomb: [name] Other Clinician: Referring Noma Quijas: Treating Minnetta Sandora/Extender: [name] in Treatment: 16 Edema Assessment Assessed: [name]: Yes] [name]: Yes] Edema: [Left: Yes] [Right: Yes] Calf Left: Right: Point of Measurement: 38 cm From Medial Instep 30.4 cm 34 cm Ankle Left: Right: Point of Measurement: 9 cm From Medial Instep 22.2 cm 22.5 cm Vascular Assessment Pulses: Dorsalis Pedis Palpable: [Left:Yes] [Right:Yes] Posterior Tibial Palpable: [Left:Yes] [Right:Yes] Electronic Signature(s) Signed: 02/23/2021 5:10:40 PM By: Rhae Hammock RN Entered By: Rhae Hammock on 02/23/2021 15:21:01 -------------------------------------------------------------------------------- Multi Wound Chart Details Patient Name: Date of Service: Jackie [name], [place]. 02/23/2021 2:30 PM Medical Record Number: [id number] Patient Account Number: [id number] Date of Birth/Sex: Treating RN: [birth year] (87 y.o. Jackie [name], Lauren Primary Care Juwann Sherk: [name] Other Clinician: Referring  Donyel Nester: Treating Alacia Rehmann/Extender: [name] in Treatment: 16 Vital Signs Height(in): 61 Pulse(bpm): 73 Weight(lbs): 126 Blood Pressure(mmHg): 152/77 Body Mass Index(BMI): 24 Temperature(F): 97.6 Respiratory Rate(breaths/min): 18 Photos: [3:No Photos Right Lower Leg] [4:No Photos Left, Lateral Lower Leg] [N/A:N/A N/A] Wound Location: [3:Gradually Appeared] [4:Gradually Appeared] [N/A:N/A] Wounding Event: [3:Lymphedema] [4:Lymphedema] [N/A:N/A] Primary Etiology: [3:Venous Leg Ulcer] [4:Venous Leg Ulcer] [N/A:N/A] Secondary Etiology: [3:02/11/2021] [4:02/11/2021] [N/A:N/A] Date Acquired: [3:1] [4:1] [N/A:N/A] Weeks of Treatment: [  3:Open] [4:Open] [N/A:N/A] Wound Status: [3:0x0x0] [4:0x0x0] [N/A:N/A] Measurements L x W x D (cm) [3:0] [4:0] [N/A:N/A] A (cm) : rea [3:0] [4:0] [N/A:N/A] Volume (cm) : [3:100.00%] [4:100.00%] [N/A:N/A] % Reduction in A rea: [3:100.00%] [4:100.00%] [N/A:N/A] % Reduction in Volume: [3:Partial Thickness] [4:Partial Thickness] [N/A:N/A] Treatment Notes Wound #3 (Lower Leg) Wound Laterality: Right Cleanser Peri-Wound Care Topical Primary Dressing Secondary Dressing Secured With Compression Wrap Compression Stockings Add-Ons Notes Healed, discharged from center Wound #4 (Lower Leg) Wound Laterality: Left, Lateral Cleanser Peri-Wound Care Topical Primary Dressing Secondary Dressing Secured With Compression Wrap Compression Stockings Add-Ons Notes Healed, discharged from center Electronic Signature(s) Signed: 02/25/2021 7:47:56 AM By: Linton Ham MD Signed: 02/26/2021 5:01:26 PM By: Rhae Hammock RN Entered By: Linton Ham on 02/25/2021 07:27:06 -------------------------------------------------------------------------------- Multi-Disciplinary Care Plan Details Patient Name: Date of Service: Jackie [name], [place]. 02/23/2021 2:30 PM Medical Record Number: [id number] Patient Account Number:  [id number] Date of Birth/Sex: Treating RN: 10-04-1932 (87 y.o. Jackie [name], Lauren Primary Care Jovante Hammitt: [name] Other Clinician: Referring Windle Huebert: Treating Damar Petit/Extender: [name] in Treatment: [place] reviewed with physician Active Inactive Electronic Signature(s) Signed: 02/23/2021 5:10:40 PM By: Rhae Hammock RN Entered By: Rhae Hammock on 02/23/2021 15:08:01 -------------------------------------------------------------------------------- Pain Assessment Details Patient Name: Date of Service: Jackie [name], [place]. 02/23/2021 2:30 PM Medical Record Number: [id number] Patient Account Number: [id number] Date of Birth/Sex: Treating RN: [birth year] (87 y.o. Jackie [name], Lauren Primary Care Fortino Haag: [name] Other Clinician: Referring Masie Bermingham: Treating Izabelle Daus/Extender: [name] in Treatment: 16 Active Problems Location of Pain Severity and Description of Pain Patient Has Paino No Patient Has Paino No Site Locations Pain Management and Medication Current Pain Management: Electronic Signature(s) Signed: 02/23/2021 5:10:40 PM By: Rhae Hammock RN Signed: 02/24/2021 9:05:34 AM By: Sandre Kitty Entered By: Sandre Kitty on 02/23/2021 14:51:19 -------------------------------------------------------------------------------- Patient/Caregiver Education Details Patient Name: Date of Service: Jackie [name] CE M. 3/29/2022andnbsp2:30 PM Medical Record Number: [id number] Patient Account Number: [id number] Date of Birth/Gender: Treating RN: 1932-05-29 (87 y.o. Benjaman Lobe Primary Care Physician: [name] Other Clinician: Referring Physician: Treating Physician/Extender: [name] in Treatment: 16 Education Assessment Education Provided To: Patient Education Topics Provided Wound/Skin Impairment: Methods:  Explain/Verbal Responses: State content correctly [employer]) Signed: 02/23/2021 5:10:40 PM By: Rhae Hammock RN Entered By: Rhae Hammock on 02/23/2021 15:29:29 -------------------------------------------------------------------------------- Wound Assessment Details Patient Name: Date of Service: Jackie [name], [employer]. 02/23/2021 2:30 PM Medical Record Number: [id number] Patient Account Number: [id number] Date of Birth/Sex: Treating RN: 1932/08/26 (87 y.o. Jackie [name], Lauren Primary Care Jaslynne Dahan: Other Clinician: [name] Referring Araceli Arango: Treating Brayam Boeke/Extender: [name] in Treatment: 16 Wound Status Wound Number: 3 Primary Etiology: Lymphedema Wound Location: Right Lower Leg Secondary Etiology: Venous Leg Ulcer Wounding Event: Gradually Appeared Wound Status: Open Date Acquired: 02/11/2021 Weeks Of Treatment: 1 Clustered Wound: No Wound Measurements Length: (cm) Width: (cm) Depth: (cm) Area: (cm) Volume: (cm) 0 % Reduction in Area: 100% 0 % Reduction in Volume: 100% 0 0 0 Wound Description Classification: Partial Thickness Electronic Signature(s) Signed: 02/23/2021 5:10:40 PM By: Rhae Hammock RN Signed: 02/24/2021 9:05:34 AM By: Sandre Kitty Entered By: Sandre Kitty on 02/23/2021 14:56:40 -------------------------------------------------------------------------------- Wound Assessment Details Patient Name: Date of Service: Jackie [name], [name] 02/23/2021 2:30 PM Medical Record Number: [id number] Patient Account Number: [id number] Date of Birth/Sex: Treating RN: November 15, 1932 (87 y.o. Jackie [name], Lauren Primary Care Evaan Tidwell: [name] Other  Clinician: Referring Jabron Weese: Treating Burdell Peed/Extender: [name] in Treatment: 16 Wound Status Wound Number: 4 Primary Etiology: Lymphedema Wound Location: Left, Lateral Lower Leg Secondary Etiology: Venous Leg  Ulcer Wounding Event: Gradually Appeared Wound Status: Open Date Acquired: 02/11/2021 Weeks Of Treatment: 1 Clustered Wound: No Wound Measurements Length: (cm) Width: (cm) Depth: (cm) Area: (cm) Volume: (cm) 0 % Reduction in Area: 100% 0 % Reduction in Volume: 100% 0 0 0 Wound Description Classification: Partial Thickness Electronic Signature(s) Signed: 02/23/2021 5:10:40 PM By: Rhae Hammock RN Signed: 02/24/2021 9:05:34 AM By: Sandre Kitty Entered By: Sandre Kitty on 02/23/2021 14:56:41 -------------------------------------------------------------------------------- Vitals Details Patient Name: Date of Service: Jackie [name], [name] 02/23/2021 2:30 PM Medical Record Number: [id number] Patient Account Number: [id number] Date of Birth/Sex: Treating RN: [birth year] (87 y.o. Jackie [name], Lauren Primary Care Khrystyne Arpin: [name] Other Clinician: Referring Arma Reining: Treating Ekam Besson/Extender: [name] in Treatment: 16 Vital Signs Time Taken: 14:50 Temperature (F): 97.6 Height (in): 61 Pulse (bpm): 62 Weight (lbs): 126 Respiratory Rate (breaths/min): 18 Body Mass Index (BMI): 23.8 Blood Pressure (mmHg): 152/77 Reference Range: 80 - 120 mg / dl Electronic Signature(s) Signed: 02/24/2021 9:05:34 AM By: Sandre Kitty Entered By: Sandre Kitty on 02/23/2021 14:51:13

## 2021-02-25 NOTE — Progress Notes (Signed)
CALIA, NAPP [place]. ([id number]) Visit Report for 02/23/2021 HPI Details Patient Name: Date of Service: [name] CE M. 02/23/2021 2:30 PM Medical Record Number: [id number] Patient Account Number: [id number] Date of Birth/Sex: Treating RN: 1932/06/07 (87 y.o. [name], Lauren Primary Care Provider: [name] Other Clinician: Referring Provider: Treating Provider/Extender: [name] in Treatment: 16 History of Present Illness HPI Description: ADMISSION 07/17/2020 This is an 87 year old woman who is referred by her nurse practitioner at [facility] who has been doing treatment for the wound on the right medial lower leg and ankle. Apparently 1 point a substantial wound however it is largely epithelialized now. Patient states its been there for about 5 or 6 weeks. On the background she has changes in the skin in this area I think the go back several years to when she had a left hip fracture. Nevertheless she does not have a wound history that I could elicit. As far as I know that they had been using TCA, Mepitel under an Unna boot they have been changing this weekly at [facility] [employer]. She also received a course of doxycycline. Past medical history very little that I can find in  link. She has a history of venous insufficiency bilaterally and a left hip fracture hypertension and a history of thyroidectomy for thyroid cancer. ABI in our clinic was 1.13 on the left 8/27; the patient is substantial initial wound by her description on the left medial lower leg and ankle is completely epithelialized. She has chronic venous insufficiency, dermatosclerosis and probably lymphedema. She does not have stockings and states that she would have trouble getting over the toe stockings on in any fashion because of hip problems except 9/3; this is a patient with chronic venous insufficiency with a particularly difficult area on her left  medial lower leg and ankle. My assessment of this is that she has chronic venous insufficiency and chronic venous hypertension. And at least on the left medial ankle stasis dermatitis. The area still looks angry and inflamed. Nevertheless it is epithelialized. I do not believe there is active infection. We have ordered her bilateral juxta lite stockings. These should be easier for her to put on but need to be adjusted to 30/40 compression. If this is not successful in maintaining this area I think she is going to need formal reflux studies to see if she might be a candidate for ablation of the greater saphenous vein 08/05/2020 upon evaluation today patient appears to be doing poorly in regard to her lower extremity. Unfortunately this has reopened after being healed on Friday. She tells me she was wearing her compression wrap but that even when she woke up in the morning on Saturday morning she was leaking. Fortunately there is no signs of active infection at this time systemically though there is some question of a thing locally. She does have juxta light compression bilaterally. 08/12/2020 on evaluation today patient appears to be doing well with regard to her leg ulcer which actually is closed again at this point. With that being said she is still having some issues currently with very thin skin that has come back over and again with her swelling if we do not wrap around afraid this is can open right back up. Nonetheless I do believe that she should proceed with the vascular testing next Thursday as previously ordered and recommended. Fortunately there is no signs of active infection at this time. 9/23; this is a patient I  discharged a few weeks ago. Apparently she had a reopening on the left leg medially. This is closed now. We have ordered venous reflux studies but they are not until 10/5 at [place] 10/29/2020 Patient is a now 87 year old female. She is somewhat frail  however still lives at home largely on her own. We had her in the clinic in August and September 2021 with a wound on the left medial lower extremity in the setting of chronic venous insufficiency and lymphedema. This closed reasonably easily and we discharged her to juxta lite stockings. After her discharge she had her venous reflux studies at interventional radiology. She was seen in consultation by Dr. Earleen Newport. She did not have evidence of thrombosis. She did have significant reflux of the bilateral greater saphenous vein on the right there was reflux from the saphenofemoral junction to the proximal calf where there is further decompression through network of varicosities on the left there was reflux from the saphenofemoral junction to the distal calf there is also a varicose network decompressing the calf. She was recommended I think for venous ablations of the greater saphenous vein on the left however she is wanting to defer that it into the new year. She tells me that very shortly after she left here the last time she was not able to put the juxta lite stocking on largely the inner sleeve. She developed swelling and then leakage and skin breakdown again in the left medial lower extremity. She was seen by podiatry on 3 occasions I think they were applying Unna boots. Her son says that at one point the [facility] boot was not put on properly his wife who is a nurse had to adjust this and the swelling is actually somewhat better. She arrives in clinic today with very significant swelling of the proximal two thirds of her lower extremity erythema superficial skin breakdown on the left medial ankle and calf and weeping edema fluid. Past medical history not much change from last time she has chronic venous insufficiency with very significant reflux and lymphedema. She states that she has trouble bending over to get the inner part of the juxta lite stocking on because of hip problems. Her ABI in this clinic  was not repeated but the last time she was here it was 1.13 she was not felt to have an arterial issue 12/9; the patient's wound on the left medial lower leg is again closed. She has chronic hemosiderin and stasis dermatitis left greater than right lipodermatosclerosis left greater than right. On the right leg today we are not wrapping she says she hit this on a screen door however there is no open area here. The right leg has a lot of swelling 12/21; this is a patient we discharged 10 days ago. She had bilateral juxta lite stockings and was supposed to be getting compression pumps from her son. According the patient her son tested positive for Covid and has been on quarantine and she is unable to get the internal part of the juxta lites on properly. She therefore has weeping edema left greater than right medial and is back in clinic. 12/28. Patient comes in with no open areas on the right leg. She still has very inflamed tenderness on the left medial ankle and lower calf. This could be cellulitis or stasis dermatitis. Also similar changes on the left lateral 1/6; patient returns to clinic. We did not wrap her right leg last time we wrapped the left. I gave her a course  of antibiotics because of tenderness on the left medial ankle possible cellulitis versus severe stasis dermatitis. Everything is taken care of on the left where the 3 layer compression was. Unfortunately on the right she has inflamed edema on the right medial ankle and right lateral ankle. Poorly controlled edema. She Kaven with her juxta lite stocking on but it is not wrapped tightly enough to control the swelling. 1/13; she comes back in today after a week of compression wraps on all her wounds are healed. Her skin is dry and flaky she has stasis dermatitis of predominantly in the right medial greater than left medial lower leg extending into her foot on the right. She has a bag of stockings and related apparel including juxta lite  stockings, over the toe compression stockings, a sock Donner. She is for 1 reason or another not able to get these things on at home on her own she comes in today with her daughter from [place]. She got external compression pumps delivered the other day but has not started to use them. The other major problem is she lives alone 12/23/2020 upon evaluation today patient unfortunately presents for readmission due to issues that she is having with weeping of her lower extremities along with increased discomfort and pain. She also has increased redness. I am concerned about infection here among other things. She has not been using the compression as she tells me that this is not easy for her to do. She is also not been using her lymphedema pumps enough to even know "whether they would work or not" 2/3; patient came into the clinic last week with concern of cellulitis increasing edema with weeping. She was put on doxycycline she comes in today with dry skin once again but no wounds and no evidence of infection. She probably had stasis dermatitis. She is here with her daughter. As usual we will long discussion about how were going to arrange to have the patient put stockings on. Since she was last here she had a fall is apparently fractured L3 in the lower thoracic vertebrae. She has some discomfort. Certainly in this situation she could not be expected to put on compression stockings. She has not use the compression pumps we ordered either. 2/17; we discharge the patient 2 weeks ago with bilateral juxta lite stockings. We also suggested in-home care in the morning to help her get the stockings on with the options of being perhaps assisted living or going to live in [place] with her daughter however she is done none of the above. She called the clinic multiple times to report weeping edema. She states today than on Monday, 3 days ago her sheets were soaked with fluid when she woke up in the morning. 2/24;  patient once again today looks fine. There is no swelling no weeping edema and no wounds. She brought in her stockings. 01/28/2021 upon evaluation today patient's wounds actually are showing signs of being completely healed I do not see anything open she does have a lot of dry skin at this point unfortunately. Fortunately there is no signs of active infection however and in general I feel like she is doing quite well. No fevers, chills, nausea, vomiting, or diarrhea. 02/16/2021; patient was discharged almost 3 weeks ago. She is back in clinic today again with weeping areas on the left lateral lower leg and on the right lower leg. Severe stasis dermatitis poorly controlled edema. She apparently has been more compliant with her juxta lites although I  did not actually see these on and I am not certain that they were on with enough pressure. She does not complain of pain just the leaking fluid out of the areas 3/29; arrives in clinic today everything is closed. Her edema control is actually quite good. She has been in compression wraps however. She has marked chronic stasis dermatitis skin changes on the right dry flaking skin. Electronic Signature(s) Signed: 02/25/2021 7:47:56 AM By: Linton Ham MD Entered By: Linton Ham on 02/25/2021 07:27:48 -------------------------------------------------------------------------------- Physical Exam Details Patient Name: Date of Service: [name], [place]. 02/23/2021 2:30 PM Medical Record Number: [id number] Patient Account Number: [id number] Date of Birth/Sex: Treating RN: 12-06-1931 (87 y.o. Benjaman Lobe Primary Care Provider: [name] Other Clinician: Referring Provider: Treating Provider/Extender: [name] in Treatment: 26 Constitutional Patient is hypertensive.. Pulse regular and within target range for patient.Marland Kitchen Respirations regular, non-labored and within target range.. Temperature is normal and within  the target range for the patient.Marland Kitchen Appears in no distress. Notes Wound exam; there is no open areas on the left lateral or right lateral lower legs. On the right she does have stasis dermatitis changes with inflammation chronic flaking dry skin but no open areas. No leaking of edema fluid like last week. Electronic Signature(s) Signed: 02/25/2021 7:47:56 AM By: Linton Ham MD Entered By: Linton Ham on 02/25/2021 07:28:33 -------------------------------------------------------------------------------- Physician Orders Details Patient Name: Date of Service: [name], [place]. 02/23/2021 2:30 PM Medical Record Number: [id number] Patient Account Number: [id number] Date of Birth/Sex: Treating RN: 03-07-1932 (87 y.o. [name], Lauren Primary Care Provider: [name] Other Clinician: Referring Provider: Treating Provider/Extender: [name] in Treatment: 16 Verbal / Phone Orders: No Diagnosis Coding Follow-up Appointments Other: - No need to follow up!!:) Call us if you have any future concerns!!:) Discharge From [facility] Services Discharge from [place] Edema Control - Lymphedema / SCD / Other Elevate legs to the level of the heart or above for 30 minutes daily and/or when sitting, a frequency of: Avoid standing for long periods of time. Moisturize legs daily. Compression stocking or Garment 20-30 mm/Hg pressure to: - wear juxta lites to both legs. Apply in the morning and remove at bedtime Electronic Signature(s) Signed: 02/23/2021 5:10:40 PM By: Rhae Hammock RN Signed: 02/25/2021 7:47:56 AM By: Linton Ham MD Entered By: Rhae Hammock on 02/23/2021 15:24:07 -------------------------------------------------------------------------------- Problem List Details Patient Name: Date of Service: [name], [place]. 02/23/2021 2:30 PM Medical Record Number: [id number] Patient Account Number: [id number] Date of Birth/Sex: Treating  RN: [birth year] (87 y.o. 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 02/23/2021 2:30 PM Medical Record Number: [id number] Patient Account Number: [id number] Date of Birth/Sex: Treating RN: 02/13/1932 (87 y.o. Benjaman Lobe Primary Care Provider: [name] Other Clinician: Referring Provider: Treating Provider/Extender: [name] in Treatment: 16 Subjective History of Present Illness (  HPI) ADMISSION 07/17/2020 This is an 87 year old woman who is referred by her nurse practitioner at [facility] who has been doing treatment for the wound on the right medial lower leg and ankle. Apparently 1 point a substantial wound however it is largely epithelialized now. Patient states its been there for about 5 or 6 weeks. On the background she has changes in the skin in this area I think the go back several years to when she had a left hip fracture. Nevertheless she does not have a wound history that I could elicit. As far as I know that they had been using TCA, Mepitel under an Unna boot they have been changing this weekly at [facility] [employer].  She also received a course of doxycycline. Past medical history very little that I can find in [place] link. She has a history of venous insufficiency bilaterally and a left hip fracture hypertension and a history of thyroidectomy for thyroid cancer. ABI in our clinic was 1.13 on the left 8/27; the patient is substantial initial wound by her description on the left medial lower leg and ankle is completely epithelialized. She has chronic venous insufficiency, dermatosclerosis and probably lymphedema. She does not have stockings and states that she would have trouble getting over the toe stockings on in any fashion because of hip problems except 9/3; this is a patient with chronic venous insufficiency with a particularly difficult area on her left medial lower leg and ankle. My assessment of this is that she has chronic venous insufficiency and chronic venous hypertension. And at least on the left medial ankle stasis dermatitis. The area still looks angry and inflamed. Nevertheless it is epithelialized. I do not believe there is active infection. We have ordered her bilateral juxta lite stockings. These should be easier for her to put on but need to be adjusted to 30/40 compression. If this is not successful in maintaining this area I think she is going to need formal reflux studies to see if she might be a candidate for ablation of the greater saphenous vein 08/05/2020 upon evaluation today patient appears to be doing poorly in regard to her lower extremity. Unfortunately this has reopened after being healed on Friday. She tells me she was wearing her compression wrap but that even when she woke up in the morning on Saturday morning she was leaking. Fortunately there is no signs of active infection at this time systemically though there is some question of a thing locally. She does have juxta light compression bilaterally. 08/12/2020 on evaluation today patient appears to be doing well with regard  to her leg ulcer which actually is closed again at this point. With that being said she is still having some issues currently with very thin skin that has come back over and again with her swelling if we do not wrap around afraid this is can open right back up. Nonetheless I do believe that she should proceed with the vascular testing next Thursday as previously ordered and recommended. Fortunately there is no signs of active infection at this time. 9/23; this is a patient I discharged a few weeks ago. Apparently she had a reopening on the left leg medially. This is closed now. We have ordered venous reflux studies but they are not until 10/5 at [place] 10/29/2020 Patient is a now 87 year old female. She is somewhat frail however still lives at home largely on her own. We had her in the clinic in August and September 2021  with a wound on the left medial lower extremity in the setting of chronic venous insufficiency and lymphedema. This closed reasonably easily and we discharged her to juxta lite stockings. After her discharge she had her venous reflux studies at interventional radiology. She was seen in consultation by Dr. Earleen Newport. She did not have evidence of thrombosis. She did have significant reflux of the bilateral greater saphenous vein on the right there was reflux from the saphenofemoral junction to the proximal calf where there is further decompression through network of varicosities on the left there was reflux from the saphenofemoral junction to the distal calf there is also a varicose network decompressing the calf. She was recommended I think for venous ablations of the greater saphenous vein on the left however she is wanting to defer that it into the new year. She tells me that very shortly after she left here the last time she was not able to put the juxta lite stocking on largely the inner sleeve. She developed swelling and then leakage and skin breakdown again  in the left medial lower extremity. She was seen by podiatry on 3 occasions I think they were applying Unna boots. Her son says that at one point the [facility] boot was not put on properly his wife who is a nurse had to adjust this and the swelling is actually somewhat better. She arrives in clinic today with very significant swelling of the proximal two thirds of her lower extremity erythema superficial skin breakdown on the left medial ankle and calf and weeping edema fluid. Past medical history not much change from last time she has chronic venous insufficiency with very significant reflux and lymphedema. She states that she has trouble bending over to get the inner part of the juxta lite stocking on because of hip problems. Her ABI in this clinic was not repeated but the last time she was here it was 1.13 she was not felt to have an arterial issue 12/9; the patient's wound on the left medial lower leg is again closed. She has chronic hemosiderin and stasis dermatitis left greater than right lipodermatosclerosis left greater than right. On the right leg today we are not wrapping she says she hit this on a screen door however there is no open area here. The right leg has a lot of swelling 12/21; this is a patient we discharged 10 days ago. She had bilateral juxta lite stockings and was supposed to be getting compression pumps from her son. According the patient her son tested positive for Covid and has been on quarantine and she is unable to get the internal part of the juxta lites on properly. She therefore has weeping edema left greater than right medial and is back in clinic. 12/28. Patient comes in with no open areas on the right leg. She still has very inflamed tenderness on the left medial ankle and lower calf. This could be cellulitis or stasis dermatitis. Also similar changes on the left lateral 1/6; patient returns to clinic. We did not wrap her right leg last time we wrapped the left. I gave  her a course of antibiotics because of tenderness on the left medial ankle possible cellulitis versus severe stasis dermatitis. Everything is taken care of on the left where the 3 layer compression was. Unfortunately on the right she has inflamed edema on the right medial ankle and right lateral ankle. Poorly controlled edema. She K[place] with her juxta lite stocking on but it is not wrapped tightly enough to  control the swelling. 1/13; she comes back in today after a week of compression wraps on all her wounds are healed. Her skin is dry and flaky she has stasis dermatitis of predominantly in the right medial greater than left medial lower leg extending into her foot on the right. She has a bag of stockings and related apparel including juxta lite stockings, over the toe compression stockings, a sock Donner. She is for 1 reason or another not able to get these things on at home on her own she comes in today with her daughter from [place]. She got external compression pumps delivered the other day but has not started to use them. The other major problem is she lives alone 12/23/2020 upon evaluation today patient unfortunately presents for readmission due to issues that she is having with weeping of her lower extremities along with increased discomfort and pain. She also has increased redness. I am concerned about infection here among other things. She has not been using the compression as she tells me that this is not easy for her to do. She is also not been using her lymphedema pumps enough to even know "whether they would work or not" 2/3; patient came into the clinic last week with concern of cellulitis increasing edema with weeping. She was put on doxycycline she comes in today with dry skin once again but no wounds and no evidence of infection. She probably had stasis dermatitis. She is here with her daughter. As usual we will long discussion about how were going to arrange to have the patient put  stockings on. Since she was last here she had a fall is apparently fractured L3 in the lower thoracic vertebrae. She has some discomfort. Certainly in this situation she could not be expected to put on compression stockings. She has not use the compression pumps we ordered either. 2/17; we discharge the patient 2 weeks ago with bilateral juxta lite stockings. We also suggested in-home care in the morning to help her get the stockings on with the options of being perhaps assisted living or going to live in [place] with her daughter however she is done none of the above. She called the clinic multiple times to report weeping edema. She states today than on Monday, 3 days ago her sheets were soaked with fluid when she woke up in the morning. 2/24; patient once again today looks fine. There is no swelling no weeping edema and no wounds. She brought in her stockings. 01/28/2021 upon evaluation today patient's wounds actually are showing signs of being completely healed I do not see anything open she does have a lot of dry skin at this point unfortunately. Fortunately there is no signs of active infection however and in general I feel like she is doing quite well. No fevers, chills, nausea, vomiting, or diarrhea. 02/16/2021; patient was discharged almost 3 weeks ago. She is back in clinic today again with weeping areas on the left lateral lower leg and on the right lower leg. Severe stasis dermatitis poorly controlled edema. She apparently has been more compliant with her juxta lites although I did not actually see these on and I am not certain that they were on with enough pressure. She does not complain of pain just the leaking fluid out of the areas 3/29; arrives in clinic today everything is closed. Her edema control is actually quite good. She has been in compression wraps however. She has marked chronic stasis dermatitis skin changes on the right dry flaking  skin. Objective Constitutional Patient is  hypertensive.. Pulse regular and within target range for patient.Marland Kitchen Respirations regular, non-labored and within target range.. Temperature is normal and within the target range for the patient.Marland Kitchen Appears in no distress. Vitals Time Taken: 2:50 PM, Height: 61 in, Weight: 126 lbs, BMI: 23.8, Temperature: 97.6 F, Pulse: 62 bpm, Respiratory Rate: 18 breaths/min, Blood Pressure: 152/77 mmHg. General Notes: Wound exam; there is no open areas on the left lateral or right lateral lower legs. On the right she does have stasis dermatitis changes with inflammation chronic flaking dry skin but no open areas. No leaking of edema fluid like last week. Integumentary (Hair, Skin) Wound #3 status is Open. Original cause of wound was Gradually Appeared. The date acquired was: 02/11/2021. The wound has been in treatment 1 weeks. The wound is located on the Right Lower Leg. The wound measures 0cm length x 0cm width x 0cm depth; 0cm^2 area and 0cm^3 volume. Wound #4 status is Open. Original cause of wound was Gradually Appeared. The date acquired was: 02/11/2021. The wound has been in treatment 1 weeks. The wound is located on the Left,Lateral Lower Leg. The wound measures 0cm length x 0cm width x 0cm depth; 0cm^2 area and 0cm^3 volume. Assessment Active Problems ICD-10 Chronic venous hypertension (idiopathic) with ulcer and inflammation of left lower extremity Lymphedema, not elsewhere classified Non-pressure chronic ulcer of left ankle limited to breakdown of skin Plan Follow-up Appointments: Other: - No need to follow up!!:) Call us if you have any future concerns!!:) Discharge From [facility] Services: Discharge from [place] Edema Control - Lymphedema / SCD / Other: Elevate legs to the level of the heart or above for 30 minutes daily and/or when sitting, a frequency of: Avoid standing for long periods of time. Moisturize legs daily. Compression stocking or Garment 20-30 mm/Hg pressure to: - wear juxta  lites to both legs. Apply in the morning and remove at bedtime #1 the patient can be discharged from the clinic again. She has all the tools here including skin moisturizer, juxta lite stockings, instructions about leg elevation, leg exercise etc. 2. Problem is the patient lives alone will not accept help and has trouble applying her stockings successfully by herself. Our staff have been over and over this with her Electronic Signature(s) Signed: 02/25/2021 7:47:56 AM By: Linton Ham MD Entered By: Linton Ham on 02/25/2021 07:29:59 -------------------------------------------------------------------------------- SuperBill Details Patient Name: Date of Service: [name], [name] 02/23/2021 Medical Record Number: [id number] Patient Account Number: [id number] Date of Birth/Sex: Treating RN: 04-Sep-1932 (87 y.o. [name], Lauren Primary Care Provider: [name] Other Clinician: Referring Provider: Treating Provider/Extender: [name] in Treatment: 16 Diagnosis Coding ICD-10 Codes Code Description [phone] Chronic venous hypertension (idiopathic) with ulcer and inflammation of left lower extremity I89.0 Lymphedema, not elsewhere classified L97.321 Non-pressure chronic ulcer of left ankle limited to breakdown of skin Facility Procedures CPT4 Code: [id number] Description: 99213 - WOUND CARE VISIT-LEV 3 EST PT Modifier: Quantity: 1 Physician Procedures : CPT4 Code Description Modifier [id number] - WC PHYS LEVEL 2 - EST PT ICD-10 Diagnosis Description I87.332 Chronic venous hypertension (idiopathic) with ulcer and inflammation of left lower extremity I89.0 Lymphedema, not elsewhere classified L97.321  Non-pressure chronic ulcer of left ankle limited to breakdown of skin Quantity: 1 Electronic Signature(s) Signed: 02/25/2021 7:47:56 AM By: Linton Ham MD Previous Signature: 02/23/2021 5:10:40 PM Version By: Rhae Hammock RN Entered By:  Linton Ham on 02/25/2021 07:30:15

## 2021-03-08 ENCOUNTER — Encounter (HOSPITAL_BASED_OUTPATIENT_CLINIC_OR_DEPARTMENT_OTHER): Payer: Medicare Other | Attending: Internal Medicine | Admitting: Internal Medicine

## 2021-03-08 ENCOUNTER — Other Ambulatory Visit: Payer: Self-pay

## 2021-03-08 DIAGNOSIS — I890 Lymphedema, not elsewhere classified: Secondary | ICD-10-CM | POA: Insufficient documentation

## 2021-03-08 DIAGNOSIS — I87332 Chronic venous hypertension (idiopathic) with ulcer and inflammation of left lower extremity: Secondary | ICD-10-CM | POA: Diagnosis not present

## 2021-03-08 DIAGNOSIS — L97818 Non-pressure chronic ulcer of other part of right lower leg with other specified severity: Secondary | ICD-10-CM | POA: Insufficient documentation

## 2021-03-08 DIAGNOSIS — L97819 Non-pressure chronic ulcer of other part of right lower leg with unspecified severity: Secondary | ICD-10-CM | POA: Diagnosis present

## 2021-03-08 NOTE — Progress Notes (Signed)
TIFFANE, SHELDON [place]. ([id number]) Visit Report for 03/08/2021 HPI Details Patient Name: Date of Service: [name] CE M. 03/08/2021 3:30 PM Medical Record Number: [id number] Patient Account Number: [id number] Date of Birth/Sex: Treating RN: 08/04/1932 (87 y.o. [name] Primary Care Provider: [name] Other Clinician: Referring Provider: Treating Provider/Extender: [name] in Treatment: 18 History of Present Illness HPI Description: ADMISSION 07/17/2020 This is an 87 year old woman who is referred by her nurse practitioner at [facility] who has been doing treatment for the wound on the right medial lower leg and ankle. Apparently 1 point a substantial wound however it is largely epithelialized now. Patient states its been there for about 5 or 6 weeks. On the background she has changes in the skin in this area I think the go back several years to when she had a left hip fracture. Nevertheless she does not have a wound history that I could elicit. As far as I know that they had been using TCA, Mepitel under an Unna boot they have been changing this weekly at [facility] [employer]. She also received a course of doxycycline. Past medical history very little that I can find in [place] link. She has a history of venous insufficiency bilaterally and a left hip fracture hypertension and a history of thyroidectomy for thyroid cancer. ABI in our clinic was 1.13 on the left 8/27; the patient is substantial initial wound by her description on the left medial lower leg and ankle is completely epithelialized. She has chronic venous insufficiency, dermatosclerosis and probably lymphedema. She does not have stockings and states that she would have trouble getting over the toe stockings on in any fashion because of hip problems except 9/3; this is a patient with chronic venous insufficiency with a particularly difficult area on her left  medial lower leg and ankle. My assessment of this is that she has chronic venous insufficiency and chronic venous hypertension. And at least on the left medial ankle stasis dermatitis. The area still looks angry and inflamed. Nevertheless it is epithelialized. I do not believe there is active infection. We have ordered her bilateral juxta lite stockings. These should be easier for her to put on but need to be adjusted to 30/40 compression. If this is not successful in maintaining this area I think she is going to need formal reflux studies to see if she might be a candidate for ablation of the greater saphenous vein 08/05/2020 upon evaluation today patient appears to be doing poorly in regard to her lower extremity. Unfortunately this has reopened after being healed on Friday. She tells me she was wearing her compression wrap but that even when she woke up in the morning on Saturday morning she was leaking. Fortunately there is no signs of active infection at this time systemically though there is some question of a thing locally. She does have juxta light compression bilaterally. 08/12/2020 on evaluation today patient appears to be doing well with regard to her leg ulcer which actually is closed again at this point. With that being said she is still having some issues currently with very thin skin that has come back over and again with her swelling if we do not wrap around afraid this is can open right back up. Nonetheless I do believe that she should proceed with the vascular testing next Thursday as previously ordered and recommended. Fortunately there is no signs of active infection at this time. 9/23; this is a patient I  discharged a few weeks ago. Apparently she had a reopening on the left leg medially. This is closed now. We have ordered venous reflux studies but they are not until 10/5 at [place] 10/29/2020 Patient is a now 87 year old female. She is somewhat frail  however still lives at home largely on her own. We had her in the clinic in August and September 2021 with a wound on the left medial lower extremity in the setting of chronic venous insufficiency and lymphedema. This closed reasonably easily and we discharged her to juxta lite stockings. After her discharge she had her venous reflux studies at interventional radiology. She was seen in consultation by Dr. Earleen Newport. She did not have evidence of thrombosis. She did have significant reflux of the bilateral greater saphenous vein on the right there was reflux from the saphenofemoral junction to the proximal calf where there is further decompression through network of varicosities on the left there was reflux from the saphenofemoral junction to the distal calf there is also a varicose network decompressing the calf. She was recommended I think for venous ablations of the greater saphenous vein on the left however she is wanting to defer that it into the new year. She tells me that very shortly after she left here the last time she was not able to put the juxta lite stocking on largely the inner sleeve. She developed swelling and then leakage and skin breakdown again in the left medial lower extremity. She was seen by podiatry on 3 occasions I think they were applying Unna boots. Her son says that at one point the [facility] boot was not put on properly his wife who is a nurse had to adjust this and the swelling is actually somewhat better. She arrives in clinic today with very significant swelling of the proximal two thirds of her lower extremity erythema superficial skin breakdown on the left medial ankle and calf and weeping edema fluid. Past medical history not much change from last time she has chronic venous insufficiency with very significant reflux and lymphedema. She states that she has trouble bending over to get the inner part of the juxta lite stocking on because of hip problems. Her ABI in this clinic  was not repeated but the last time she was here it was 1.13 she was not felt to have an arterial issue 12/9; the patient's wound on the left medial lower leg is again closed. She has chronic hemosiderin and stasis dermatitis left greater than right lipodermatosclerosis left greater than right. On the right leg today we are not wrapping she says she hit this on a screen door however there is no open area here. The right leg has a lot of swelling 12/21; this is a patient we discharged 10 days ago. She had bilateral juxta lite stockings and was supposed to be getting compression pumps from her son. According the patient her son tested positive for Covid and has been on quarantine and she is unable to get the internal part of the juxta lites on properly. She therefore has weeping edema left greater than right medial and is back in clinic. 12/28. Patient comes in with no open areas on the right leg. She still has very inflamed tenderness on the left medial ankle and lower calf. This could be cellulitis or stasis dermatitis. Also similar changes on the left lateral 1/6; patient returns to clinic. We did not wrap her right leg last time we wrapped the left. I gave her a course  of antibiotics because of tenderness on the left medial ankle possible cellulitis versus severe stasis dermatitis. Everything is taken care of on the left where the 3 layer compression was. Unfortunately on the right she has inflamed edema on the right medial ankle and right lateral ankle. Poorly controlled edema. She [place] with her juxta lite stocking on but it is not wrapped tightly enough to control the swelling. 1/13; she comes back in today after a week of compression wraps on all her wounds are healed. Her skin is dry and flaky she has stasis dermatitis of predominantly in the right medial greater than left medial lower leg extending into her foot on the right. She has a bag of stockings and related apparel including juxta lite  stockings, over the toe compression stockings, a sock Donner. She is for 1 reason or another not able to get these things on at home on her own she comes in today with her daughter from [place]. She got external compression pumps delivered the other day but has not started to use them. The other major problem is she lives alone 12/23/2020 upon evaluation today patient unfortunately presents for readmission due to issues that she is having with weeping of her lower extremities along with increased discomfort and pain. She also has increased redness. I am concerned about infection here among other things. She has not been using the compression as she tells me that this is not easy for her to do. She is also not been using her lymphedema pumps enough to even know "whether they would work or not" 2/3; patient came into the clinic last week with concern of cellulitis increasing edema with weeping. She was put on doxycycline she comes in today with dry skin once again but no wounds and no evidence of infection. She probably had stasis dermatitis. She is here with her daughter. As usual we will long discussion about how were going to arrange to have the patient put stockings on. Since she was last here she had a fall is apparently fractured L3 in the lower thoracic vertebrae. She has some discomfort. Certainly in this situation she could not be expected to put on compression stockings. She has not use the compression pumps we ordered either. 2/17; we discharge the patient 2 weeks ago with bilateral juxta lite stockings. We also suggested in-home care in the morning to help her get the stockings on with the options of being perhaps assisted living or going to live in [place] with her daughter however she is done none of the above. She called the clinic multiple times to report weeping edema. She states today than on Monday, 3 days ago her sheets were soaked with fluid when she woke up in the morning. 2/24;  patient once again today looks fine. There is no swelling no weeping edema and no wounds. She brought in her stockings. 01/28/2021 upon evaluation today patient's wounds actually are showing signs of being completely healed I do not see anything open she does have a lot of dry skin at this point unfortunately. Fortunately there is no signs of active infection however and in general I feel like she is doing quite well. No fevers, chills, nausea, vomiting, or diarrhea. 02/16/2021; patient was discharged almost 3 weeks ago. She is back in clinic today again with weeping areas on the left lateral lower leg and on the right lower leg. Severe stasis dermatitis poorly controlled edema. She apparently has been more compliant with her juxta lites although I  did not actually see these on and I am not certain that they were on with enough pressure. She does not complain of pain just the leaking fluid out of the areas 3/29; arrives in clinic today everything is closed. Her edema control is actually quite good. She has been in compression wraps however. She has marked chronic stasis dermatitis skin changes on the right dry flaking skin. 4/11; 2 weeks the patient arrives in clinic with severe edema in her right leg stasis dermatitis loss of surface epithelium almost circumferentially in the right lower leg. The left leg has some edema but no open wound area. In discussion it turns out that she does not wear her juxta lite product properly and she does not wear the inner stocking layer. We got her compression pumps but she does not seem to abuse them since they were set up according to her daughter. Electronic Signature(s) Signed: 03/08/2021 4:45:43 PM By: Linton Ham MD Entered By: Linton Ham on 03/08/2021 16:36:40 -------------------------------------------------------------------------------- Physical Exam Details Patient Name: Date of Service: [name], [place]. 03/08/2021 3:30 PM Medical Record  Number: [id number] Patient Account Number: [id number] Date of Birth/Sex: Treating RN: 1932/09/28 (87 y.o. [name] Primary Care Provider: [name] Other Clinician: Referring Provider: Treating Provider/Extender: [name] in Treatment: 80 Constitutional Patient is hypertensive.. Pulse regular and within target range for patient.Marland Kitchen Respirations regular, non-labored and within target range.. Temperature is normal and within the target range for the patient.Marland Kitchen Appears in no distress. Respiratory work of breathing is normal. Cardiovascular Salas pedis pulses are palpable on both side. Notes Wound exam; there is no areas on the left side. However on the right there is almost circumferential epithelial loss. Marked inflammation but I do not think there is cellulitis here. Weeping edema fluid Electronic Signature(s) Signed: 03/08/2021 4:45:43 PM By: Linton Ham MD Entered By: Linton Ham on 03/08/2021 16:37:43 -------------------------------------------------------------------------------- Physician Orders Details Patient Name: Date of Service: [name], [place]. 03/08/2021 3:30 PM Medical Record Number: [id number] Patient Account Number: [id number] Date of Birth/Sex: Treating RN: 03/15/1932 (87 y.o. [name] Primary Care Provider: [name] Other Clinician: Referring Provider: Treating Provider/Extender: [name] in Treatment: 18 Verbal / Phone Orders: No Diagnosis Coding Follow-up Appointments Return Appointment in 1 week. Bathing/ Shower/ Hygiene May shower with protection but do not get wound dressing(s) wet. Edema Control - Lymphedema / SCD / Other Lymphedema Pumps. Use Lymphedema pumps on leg(s) 2-3 times a day for 45-60 minutes. If wearing any wraps or hose, do not remove them. Continue exercising as instructed. - Ok to use pumps over compression wraps Elevate legs to the level of the  heart or above for 30 minutes daily and/or when sitting, a frequency of: - multiple times throughout the day Avoid standing for long periods of time. Exercise regularly Non Wound Condition Left Lower Extremity Other Non Wound Condition Orders/Instructions: - Apply TCA/lotion and 3 layer compression wrap to left leg. Change weekly. Wound Treatment Wound #5 - Lower Leg Wound Laterality: Right, Circumferential Cleanser: Soap and Water 1 x Per Week/7 Days Discharge Instructions: May shower and wash wound with dial antibacterial soap and water prior to dressing change. Peri-Wound Care: Triamcinolone 15 (g) 1 x Per Week/7 Days Discharge Instructions: Use triamcinolone 15 (g) as directed Peri-Wound Care: Sween Lotion (Moisturizing lotion) 1 x Per Week/7 Days Discharge Instructions: Apply moisturizing lotion as directed Prim Dressing: KerraCel Ag Gelling Fiber Dressing, 4x5 in (silver alginate)  1 x Per Week/7 Days ary Discharge Instructions: Apply silver alginate to wound bed as instructed Secondary Dressing: ABD Pad, 8x10 1 x Per Week/7 Days Discharge Instructions: Apply over primary dressing as directed. Secondary Dressing: Zetuvit Plus 4x8 in 1 x Per Week/7 Days Discharge Instructions: Apply over primary dressing as directed. Compression Wrap: ThreePress (3 layer compression wrap) 1 x Per Week/7 Days Discharge Instructions: Apply three layer compression as directed. Electronic Signature(s) Signed: 03/08/2021 4:45:43 PM By: Linton Ham MD Signed: 03/08/2021 5:30:45 PM By: Levan Hurst RN, BSN Entered By: Levan Hurst on 03/08/2021 16:17:45 -------------------------------------------------------------------------------- Problem List Details Patient Name: Date of Service: [name], [place]. 03/08/2021 3:30 PM Medical Record Number: [id number] Patient Account Number: [id number] Date of Birth/Sex: Treating RN: October 01, 1932 (87 y.o. 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 03/08/2021 3:30 PM Medical Record Number: [id number] Patient Account Number: [id number] Date of Birth/Sex: Treating RN: 1932-03-29 (87 y.o. [name] Primary Care Provider: [name] Other Clinician: Referring Provider: Treating Provider/Extender: [name] in Treatment: 18 Subjective History of Present Illness (HPI) ADMISSION 07/17/2020 This is an 87 year old woman who is referred by her nurse practitioner at [facility] who has been doing treatment for the wound on the right medial lower leg and ankle. Apparently 1 point a substantial wound however it is largely epithelialized now. Patient states its been there for about 5 or 6 weeks. On the background she has changes in the skin in this area I think the go back several years to when she had a left hip fracture. Nevertheless she does not have a wound history that I could elicit. As far as I know that they had been using TCA, Mepitel  under an Unna boot they have been changing this weekly at [facility] [employer]. She also received a course of doxycycline. Past medical history very little that I can find in  [place] link. She has a history of venous insufficiency bilaterally and a left hip fracture hypertension and a history of thyroidectomy for thyroid cancer. ABI in our clinic was 1.13 on the left 8/27; the patient is substantial initial wound by her description on the left medial lower leg and ankle is completely epithelialized. She has chronic venous insufficiency, dermatosclerosis and probably lymphedema. She does not have stockings and states that she would have trouble getting over the toe stockings on in any fashion because of hip problems except 9/3; this is a patient with chronic venous insufficiency with a particularly difficult area on her left medial lower leg and ankle. My assessment of this is that she has chronic venous insufficiency and chronic venous hypertension. And at least on the left medial ankle stasis dermatitis. The area still looks angry and inflamed. Nevertheless it is epithelialized.  I do not believe there is active infection. We have ordered her bilateral juxta lite stockings. These should be easier for her to put on but need to be adjusted to 30/40 compression. If this is not successful in maintaining this area I think she is going to need formal reflux studies to see if she might be a candidate for ablation of the greater saphenous vein 08/05/2020 upon evaluation today patient appears to be doing poorly in regard to her lower extremity. Unfortunately this has reopened after being healed on Friday. She tells me she was wearing her compression wrap but that even when she woke up in the morning on Saturday morning she was leaking. Fortunately there is no signs of active infection at this time systemically though there is some question of a thing locally. She does have juxta light  compression bilaterally. 08/12/2020 on evaluation today patient appears to be doing well with regard to her leg ulcer which actually is closed again at this point. With that being said she is still having some issues currently with very thin skin that has come back over and again with her swelling if we do not wrap around afraid this is can open right back up. Nonetheless I do believe that she should proceed with the vascular testing next Thursday as previously ordered and recommended. Fortunately there is no signs of active infection at this time. 9/23; this is a patient I discharged a few weeks ago. Apparently she had a reopening on the left leg medially. This is closed now. We have ordered venous reflux studies but they are not until 10/5 at [place] 10/29/2020 Patient is a now 87 year old female. She is somewhat frail however still lives at home largely on her own. We had her in the clinic in August and September 2021 with a wound on the left medial lower extremity in the setting of chronic venous insufficiency and lymphedema. This closed reasonably easily and we discharged her to juxta lite stockings. After her discharge she had her venous reflux studies at interventional radiology. She was seen in consultation by Dr. Earleen Newport. She did not have evidence of thrombosis. She did have significant reflux of the bilateral greater saphenous vein on the right there was reflux from the saphenofemoral junction to the proximal calf where there is further decompression through network of varicosities on the left there was reflux from the saphenofemoral junction to the distal calf there is also a varicose network decompressing the calf. She was recommended I think for venous ablations of the greater saphenous vein on the left however she is wanting to defer that it into the new year. She tells me that very shortly after she left here the last time she was not able to put the juxta lite  stocking on largely the inner sleeve. She developed swelling and then leakage and skin breakdown again in the left medial lower extremity. She was seen by podiatry on 3 occasions I think they were applying Unna boots. Her son says that at one point the [facility] boot was not put on properly his wife who is a nurse had to adjust this and the swelling is actually somewhat better. She arrives in clinic today with very significant swelling of the proximal two thirds of her lower extremity erythema superficial skin breakdown on the left medial ankle and calf and weeping edema fluid. Past medical history not much change from last time she has chronic venous insufficiency with very significant reflux and lymphedema. She states  that she has trouble bending over to get the inner part of the juxta lite stocking on because of hip problems. Her ABI in this clinic was not repeated but the last time she was here it was 1.13 she was not felt to have an arterial issue 12/9; the patient's wound on the left medial lower leg is again closed. She has chronic hemosiderin and stasis dermatitis left greater than right lipodermatosclerosis left greater than right. On the right leg today we are not wrapping she says she hit this on a screen door however there is no open area here. The right leg has a lot of swelling 12/21; this is a patient we discharged 10 days ago. She had bilateral juxta lite stockings and was supposed to be getting compression pumps from her son. According the patient her son tested positive for Covid and has been on quarantine and she is unable to get the internal part of the juxta lites on properly. She therefore has weeping edema left greater than right medial and is back in clinic. 12/28. Patient comes in with no open areas on the right leg. She still has very inflamed tenderness on the left medial ankle and lower calf. This could be cellulitis or stasis dermatitis. Also similar changes on the left  lateral 1/6; patient returns to clinic. We did not wrap her right leg last time we wrapped the left. I gave her a course of antibiotics because of tenderness on the left medial ankle possible cellulitis versus severe stasis dermatitis. Everything is taken care of on the left where the 3 layer compression was. Unfortunately on the right she has inflamed edema on the right medial ankle and right lateral ankle. Poorly controlled edema. She [place] with her juxta lite stocking on but it is not wrapped tightly enough to control the swelling. 1/13; she comes back in today after a week of compression wraps on all her wounds are healed. Her skin is dry and flaky she has stasis dermatitis of predominantly in the right medial greater than left medial lower leg extending into her foot on the right. She has a bag of stockings and related apparel including juxta lite stockings, over the toe compression stockings, a sock Donner. She is for 1 reason or another not able to get these things on at home on her own she comes in today with her daughter from [place]. She got external compression pumps delivered the other day but has not started to use them. The other major problem is she lives alone 12/23/2020 upon evaluation today patient unfortunately presents for readmission due to issues that she is having with weeping of her lower extremities along with increased discomfort and pain. She also has increased redness. I am concerned about infection here among other things. She has not been using the compression as she tells me that this is not easy for her to do. She is also not been using her lymphedema pumps enough to even know "whether they would work or not" 2/3; patient came into the clinic last week with concern of cellulitis increasing edema with weeping. She was put on doxycycline she comes in today with dry skin once again but no wounds and no evidence of infection. She probably had stasis dermatitis. She is here  with her daughter. As usual we will long discussion about how were going to arrange to have the patient put stockings on. Since she was last here she had a fall is apparently fractured L3 in the lower  thoracic vertebrae. She has some discomfort. Certainly in this situation she could not be expected to put on compression stockings. She has not use the compression pumps we ordered either. 2/17; we discharge the patient 2 weeks ago with bilateral juxta lite stockings. We also suggested in-home care in the morning to help her get the stockings on with the options of being perhaps assisted living or going to live in [place] with her daughter however she is done none of the above. She called the clinic multiple times to report weeping edema. She states today than on Monday, 3 days ago her sheets were soaked with fluid when she woke up in the morning. 2/24; patient once again today looks fine. There is no swelling no weeping edema and no wounds. She brought in her stockings. 01/28/2021 upon evaluation today patient's wounds actually are showing signs of being completely healed I do not see anything open she does have a lot of dry skin at this point unfortunately. Fortunately there is no signs of active infection however and in general I feel like she is doing quite well. No fevers, chills, nausea, vomiting, or diarrhea. 02/16/2021; patient was discharged almost 3 weeks ago. She is back in clinic today again with weeping areas on the left lateral lower leg and on the right lower leg. Severe stasis dermatitis poorly controlled edema. She apparently has been more compliant with her juxta lites although I did not actually see these on and I am not certain that they were on with enough pressure. She does not complain of pain just the leaking fluid out of the areas 3/29; arrives in clinic today everything is closed. Her edema control is actually quite good. She has been in compression wraps however. She has  marked chronic stasis dermatitis skin changes on the right dry flaking skin. 4/11; 2 weeks the patient arrives in clinic with severe edema in her right leg stasis dermatitis loss of surface epithelium almost circumferentially in the right lower leg. The left leg has some edema but no open wound area. In discussion it turns out that she does not wear her juxta lite product properly and she does not wear the inner stocking layer. We got her compression pumps but she does not seem to abuse them since they were set up according to her daughter. Objective Constitutional Patient is hypertensive.. Pulse regular and within target range for patient.Marland Kitchen Respirations regular, non-labored and within target range.. Temperature is normal and within the target range for the patient.Marland Kitchen Appears in no distress. Vitals Time Taken: 3:54 PM, Height: 61 in, Weight: 126 lbs, BMI: 23.8, Temperature: 98.4 F, Pulse: 69 bpm, Respiratory Rate: 18 breaths/min, Blood Pressure: 156/76 mmHg. Respiratory work of breathing is normal. Cardiovascular Salas pedis pulses are palpable on both side. General Notes: Wound exam; there is no areas on the left side. However on the right there is almost circumferential epithelial loss. Marked inflammation but I do not think there is cellulitis here. Weeping edema fluid Integumentary (Hair, Skin) Wound #5 status is Open. Original cause of wound was Gradually Appeared. The date acquired was: 03/08/2021. The wound is located on the Right,Circumferential Lower Leg. The wound measures 10cm length x 25cm width x 0.1cm depth; 196.35cm^2 area and 19.635cm^3 volume. There is Fat Layer (Subcutaneous Tissue) exposed. There is no tunneling or undermining noted. There is a medium amount of serous drainage noted. The wound margin is flat and intact. There is large (67-100%) pink granulation within the wound bed. There is no necrotic  tissue within the wound bed. Assessment Active Problems ICD-10 Chronic  venous hypertension (idiopathic) with ulcer and inflammation of left lower extremity Lymphedema, not elsewhere classified Non-pressure chronic ulcer of other part of right lower leg with other specified severity Procedures Wound #5 Pre-procedure diagnosis of Wound #5 is a Lymphedema located on the Right,Circumferential Lower Leg . There was a Three Layer Compression Therapy Procedure by Levan Hurst, RN. Post procedure Diagnosis Wound #5: Same as Pre-Procedure There was a Three Layer Compression Therapy Procedure by Levan Hurst, RN. Post procedure Diagnosis Wound #: Same as Pre-Procedure Plan Follow-up Appointments: Return Appointment in 1 week. Bathing/ Shower/ Hygiene: May shower with protection but do not get wound dressing(s) wet. Edema Control - Lymphedema / SCD / Other: Lymphedema Pumps. Use Lymphedema pumps on leg(s) 2-3 times a day for 45-60 minutes. If wearing any wraps or hose, do not remove them. Continue exercising as instructed. - Ok to use pumps over compression wraps Elevate legs to the level of the heart or above for 30 minutes daily and/or when sitting, a frequency of: - multiple times throughout the day Avoid standing for long periods of time. Exercise regularly Non Wound Condition: Other Non Wound Condition Orders/Instructions: - Apply TCA/lotion and 3 layer compression wrap to left leg. Change weekly. WOUND #5: - Lower Leg Wound Laterality: Right, Circumferential Cleanser: Soap and Water 1 x Per Week/7 Days Discharge Instructions: May shower and wash wound with dial antibacterial soap and water prior to dressing change. Peri-Wound Care: Triamcinolone 15 (g) 1 x Per Week/7 Days Discharge Instructions: Use triamcinolone 15 (g) as directed Peri-Wound Care: Sween Lotion (Moisturizing lotion) 1 x Per Week/7 Days Discharge Instructions: Apply moisturizing lotion as directed Prim Dressing: KerraCel Ag Gelling Fiber Dressing, 4x5 in (silver alginate) 1 x Per Week/7  Days ary Discharge Instructions: Apply silver alginate to wound bed as instructed Secondary Dressing: ABD Pad, 8x10 1 x Per Week/7 Days Discharge Instructions: Apply over primary dressing as directed. Secondary Dressing: Zetuvit Plus 4x8 in 1 x Per Week/7 Days Discharge Instructions: Apply over primary dressing as directed. Com pression Wrap: ThreePress (3 layer compression wrap) 1 x Per Week/7 Days Discharge Instructions: Apply three layer compression as directed. 1. We use TCA silver alginate ABDs and 3 layer compression on the right leg. 2. We put the left leg in a 3 layer compression as well 3. Again we have the same conversation with her about an approach that will maintain skin integrity. She needs somebody to help her get her juxta lite stockings on in the morning. Helping her with the compression pumps would also be helpful. It does not appear that she has used the compression pumps in well over 2 months 4. I do not believe there is any cellulitis here. I think this is all severe stasis dermatitis. She has almost total loss of surface epithelium in the right lower leg. Electronic Signature(s) Signed: 03/08/2021 4:45:43 PM By: Linton Ham MD Entered By: Linton Ham on 03/08/2021 16:39:09 -------------------------------------------------------------------------------- SuperBill Details Patient Name: Date of Service: [name], [place]. 03/08/2021 Medical Record Number: [id number] Patient Account Number: [id number] Date of Birth/Sex: Treating RN: 1932-10-23 (87 y.o. [name] Primary Care Provider: [name] Other Clinician: Referring Provider: Treating Provider/Extender: [name] in Treatment: 18 Diagnosis Coding ICD-10 Codes Code Description [phone] Chronic venous hypertension (idiopathic) with ulcer and inflammation of left lower extremity I89.0 Lymphedema, not elsewhere classified L97.321 Non-pressure chronic ulcer of left ankle  limited to breakdown of  skin Facility Procedures CPT4: Code [id number] foot Description: 1 BILATERAL: Application of multi-layer venous compression system; leg (below knee), including ankle and . Modifier: Quantity: 1 Physician Procedures : CPT4 Code Description Modifier [id number] - WC PHYS LEVEL 4 - EST PT ICD-10 Diagnosis Description I87.332 Chronic venous hypertension (idiopathic) with ulcer and inflammation of left lower extremity I89.0 Lymphedema, not elsewhere classified L97.321  Non-pressure chronic ulcer of left ankle limited to breakdown of skin Quantity: 1 Electronic Signature(s) Signed: 03/08/2021 4:45:43 PM By: Linton Ham MD Entered By: Linton Ham on 03/08/2021 16:39:30

## 2021-03-10 NOTE — Progress Notes (Signed)
Jackie, [name] [place]. ([id number]) Visit Report for 03/08/2021 Arrival Information Details Patient Name: Date of Service: Jackie [name] CE M. 03/08/2021 3:30 PM Medical Record Number: [id number] Patient Account Number: [id number] Date of Birth/Sex: Treating RN: 1932/03/06 (87 y.o. [name] Primary Care Elson Ulbrich: [name] Other Clinician: Referring Dorianna Mckiver: Treating Anastacio Bua/Extender: [name] in Treatment: 18 Visit Information History Since Last Visit Added or deleted any medications: No Patient Arrived: Jackie [name] Any new allergies or adverse reactions: No Arrival Time: 15:52 Had a fall or experienced change in No Accompanied By: self activities of daily living that may affect Transfer Assistance: None risk of falls: Patient Identification Verified: Yes Signs or symptoms of abuse/neglect since last visito No Secondary Verification Process Completed: Yes Hospitalized since last visit: No Patient Requires Transmission-Based Precautions: No Implantable device outside of the clinic excluding No Patient Has Alerts: Yes cellular tissue based products placed in the center Patient Alerts: ABI: L 1.13 07/2020 since last visit: Has Dressing in Place as Prescribed: Yes Pain Present Now: No Electronic Signature(s) Signed: 03/10/2021 8:29:36 AM By: Sandre Kitty Entered By: Sandre Kitty on 03/08/2021 15:54:32 -------------------------------------------------------------------------------- Compression Therapy Details Patient Name: Date of Service: Jackie [name], [place]. 03/08/2021 3:30 PM Medical Record Number: [id number] Patient Account Number: [id number] Date of Birth/Sex: Treating RN: 1932/04/04 (87 y.o. [name] Primary Care Dynastee Brummell: [name] Other Clinician: Referring Dara Beidleman: Treating Bernis Schreur/Extender: [name] in Treatment: 18 Compression Therapy Performed for Wound Assessment: Wound #5  Right,Circumferential Lower Leg Performed By: Clinician Levan Hurst, RN Compression Type: Three Layer Post Procedure Diagnosis Same as Pre-procedure Electronic Signature(s) Signed: 03/08/2021 5:30:45 PM By: Levan Hurst RN, BSN Entered By: Levan Hurst on 03/08/2021 16:16:00 -------------------------------------------------------------------------------- Compression Therapy Details Patient Name: Date of Service: Jackie [name], [place]. 03/08/2021 3:30 PM Medical Record Number: [id number] Patient Account Number: [id number] Date of Birth/Sex: Treating RN: [birth year] (87 y.o. [name] Primary Care Braylinn Gulden: [name] Other Clinician: Referring Tallie Hevia: Treating Shunta Mclaurin/Extender: [name] in Treatment: 18 Compression Therapy Performed for Wound Assessment: NonWound Condition Lymphedema - Left Leg Performed By: Clinician Levan Hurst, RN Compression Type: Three Layer Post Procedure Diagnosis Same as Pre-procedure Electronic Signature(s) Signed: 03/08/2021 5:30:45 PM By: Levan Hurst RN, BSN Entered By: Levan Hurst on 03/08/2021 40:08:67 -------------------------------------------------------------------------------- Encounter Discharge Information Details Patient Name: Date of Service: Jackie [name], [place]. 03/08/2021 3:30 PM Medical Record Number: [id number] Patient Account Number: [id number] Date of Birth/Sex: Treating RN: 03/02/1932 (87 y.o. [name] Primary Care Liam Cammarata: [name] Other Clinician: Referring Emmanuella Mirante: Treating Dannika Hilgeman/Extender: [name] in Treatment: 18 Encounter Discharge Information Items Discharge Condition: Stable Ambulatory Status: [place] Discharge Destination: Home Transportation: Private Auto Schedule Follow-up Appointment: Yes Clinical Summary of Care: Provided on 03/08/2021 Form Type Recipient Paper Patient Patient Electronic Signature(s) Signed:  03/08/2021 4:58:04 PM By: Lorrin Jackson Entered By: Lorrin Jackson on 03/08/2021 16:58:04 -------------------------------------------------------------------------------- Lower Extremity Assessment Details Patient Name: Date of Service: Jackie [name], [place]. 03/08/2021 3:30 PM Medical Record Number: [id number] Patient Account Number: [id number] Date of Birth/Sex: Treating RN: 1932/07/31 (87 y.o. [name] Primary Care Jackie [name]: [name] Other Clinician: Referring Justinn Welter: Treating Clytee Heinrich/Extender: [name] in Treatment: 18 Edema Assessment Assessed: Jackie [name]: Yes] Jackie [name]: Yes] Edema: [Left: Yes] [Right: Yes] Calf Left: Right: Point of Measurement: 38 cm From Medial Instep 31.5 cm 33 cm Ankle Left: Right: Point of Measurement: 9 cm From  Medial Instep 23 cm 22.5 cm Vascular Assessment Pulses: Dorsalis Pedis Palpable: [Left:Yes] [Right:Yes] Electronic Signature(s) Signed: 03/08/2021 5:21:09 PM By: Lorrin Jackson Entered By: Lorrin Jackson on 03/08/2021 16:00:48 -------------------------------------------------------------------------------- Multi Wound Chart Details Patient Name: Date of Service: Jackie [name], [place]. 03/08/2021 3:30 PM Medical Record Number: [id number] Patient Account Number: [id number] Date of Birth/Sex: Treating RN: [birth year] (87 y.o. [name] Primary Care Caldonia Leap: [name] Other Clinician: Referring Reymond Maynez: Treating Sloka Volante/Extender: [name] in Treatment: 18 Vital Signs Height(in): 61 Pulse(bpm): 69 Weight(lbs): 126 Blood Pressure(mmHg): 156/76 Body Mass Index(BMI): 24 Temperature(F): 98.4 Respiratory Rate(breaths/min): 18 Photos: [5:No Photos Right, Circumferential Lower Leg] [N/A:N/A N/A] Wound Location: [5:Gradually Appeared] [N/A:N/A] Wounding Event: [5:Lymphedema] [N/A:N/A] Primary Etiology: [5:Hypertension] [N/A:N/A] Comorbid History: [5:03/08/2021]  [N/A:N/A] Date Acquired: [5:0] [N/A:N/A] Weeks of Treatment: [5:Open] [N/A:N/A] Wound Status: [5:10x25x0.1] [N/A:N/A] Measurements L x W x D (cm) [5:196.35] [N/A:N/A] A (cm) : rea [5:19.635] [N/A:N/A] Volume (cm) : [5:Full Thickness Without Exposed] [N/A:N/A] Classification: [5:Support Structures Medium] [N/A:N/A] Exudate Amount: [5:Serous] [N/A:N/A] Exudate Type: [5:amber] [N/A:N/A] Exudate Color: [5:Flat and Intact] [N/A:N/A] Wound Margin: [5:Large (67-100%)] [N/A:N/A] Granulation Amount: [5:Pink] [N/A:N/A] Granulation Quality: [5:None Present (0%)] [N/A:N/A] Necrotic Amount: [5:Fat Layer (Subcutaneous Tissue): Yes N/A] Exposed Structures: [5:Fascia: No Tendon: No Muscle: No Joint: No Bone: No None] [N/A:N/A] Epithelialization: [5:Compression Therapy] [N/A:N/A] Treatment Notes Electronic Signature(s) Signed: 03/08/2021 4:45:43 PM By: Linton Ham MD Signed: 03/08/2021 5:30:45 PM By: Levan Hurst RN, BSN Entered By: Linton Ham on 03/08/2021 16:35:34 -------------------------------------------------------------------------------- Multi-Disciplinary Care Plan Details Patient Name: Date of Service: Jackie [name], [place]. 03/08/2021 3:30 PM Medical Record Number: [id number] Patient Account Number: [id number] Date of Birth/Sex: Treating RN: [birth year] (87 y.o. [name] Primary Care Nabeeha Badertscher: [name] Other Clinician: Referring Kern Gingras: Treating Shandy Vi/Extender: [name] in Treatment: 18 Multidisciplinary Care Plan reviewed with physician Active Inactive Venous Leg Ulcer Nursing Diagnoses: Actual venous Insuffiency (use after diagnosis is confirmed) Knowledge deficit related to disease process and management Potential for venous Insuffiency (use before diagnosis confirmed) Goals: Patient will maintain optimal edema control Date Initiated: 12/23/2020 Target Resolution Date: 04/09/2021 Goal Status:  Active Interventions: Assess peripheral edema status every visit. Compression as ordered Provide education on venous insufficiency Treatment Activities: T ordered outside of clinic : 12/23/2020 est Therapeutic compression applied : 12/23/2020 Notes: Wound/Skin Impairment Nursing Diagnoses: Knowledge deficit related to ulceration/compromised skin integrity Goals: Patient/caregiver will verbalize understanding of skin care regimen Date Initiated: 10/29/2020 Target Resolution Date: 04/09/2021 Goal Status: Active Interventions: Assess patient/caregiver ability to obtain necessary supplies Assess patient/caregiver ability to perform ulcer/skin care regimen upon admission and as needed Provide education on ulcer and skin care Treatment Activities: Skin care regimen initiated : 10/29/2020 Topical wound management initiated : 10/29/2020 Notes: Electronic Signature(s) Signed: 03/08/2021 5:30:45 PM By: Levan Hurst RN, BSN Entered By: Levan Hurst on 03/08/2021 16:18:58 -------------------------------------------------------------------------------- Non-Wound Condition Assessment Details Patient Name: Date of Service: Jackie [name], [place]. 03/08/2021 3:30 PM Medical Record Number: [id number] Patient Account Number: [id number] Date of Birth/Sex: Treating RN: 21-May-1932 (87 y.o. [name] Primary Care Nazli Penn: [name] Other Clinician: Referring Ralonda Tartt: Treating Sonu Kruckenberg/Extender: [name] in Treatment: 18 Non-Wound Condition: Condition: Lymphedema Location: Leg Side: Bilateral Notes: leg ankles weeping Notes Red, weeping bilateral legs. Electronic Signature(s) Signed: 03/08/2021 5:21:09 PM By: Lorrin Jackson Entered By: Lorrin Jackson on 03/08/2021 16:01:22 -------------------------------------------------------------------------------- Pain Assessment Details Patient Name: Date of Service: Jackie [name], [place] M. 03/08/2021 3:30  PM Medical Record Number: [id number] Patient Account Number: [id number] Date of Birth/Sex: Treating RN: [birth year] (87 y.o. [name] Primary Care Thurl Boen: [name] Other Clinician: Referring Everleigh Colclasure: Treating Laruth Hanger/Extender: [name] in Treatment: 18 Active Problems Location of Pain Severity and Description of Pain Patient Has Paino No Site Locations Pain Management and Medication Current Pain Management: Electronic Signature(s) Signed: 03/08/2021 5:30:45 PM By: Levan Hurst RN, BSN Signed: 03/10/2021 8:29:36 AM By: Sandre Kitty Entered By: Sandre Kitty on 03/08/2021 15:55:00 -------------------------------------------------------------------------------- Patient/Caregiver Education Details Patient Name: Date of Service: Jackie [name], [place]. 4/11/2022andnbsp3:30 PM Medical Record Number: [id number] Patient Account Number: [id number] Date of Birth/Gender: Treating RN: 14-Apr-1932 (87 y.o. [name] Primary Care Physician: [name] Other Clinician: Referring Physician: Treating Physician/Extender: [name] in Treatment: 18 Education Assessment Education Provided To: Patient Education Topics Provided Venous: Methods: Explain/Verbal Responses: State content correctly Wound/Skin Impairment: Methods: Explain/Verbal Responses: State content correctly Electronic Signature(s) Signed: 03/08/2021 5:30:45 PM By: Levan Hurst RN, BSN Entered By: Levan Hurst on 03/08/2021 16:19:42 -------------------------------------------------------------------------------- Wound Assessment Details Patient Name: Date of Service: Jackie [name], [place]. 03/08/2021 3:30 PM Medical Record Number: [id number] Patient Account Number: [id number] Date of Birth/Sex: Treating RN: 10/26/1932 (87 y.o. [name] Primary Care Nadia Torr: [name] Other Clinician: Referring Joycelynn Fritsche: Treating  Kjuan Seipp/Extender: [name] in Treatment: 18 Wound Status Wound Number: 5 Primary Etiology: Lymphedema Wound Location: Right, Circumferential Lower Leg Wound Status: Open Wounding Event: Gradually Appeared Comorbid History: Hypertension Date Acquired: 03/08/2021 Weeks Of Treatment: 0 Clustered Wound: No Wound Measurements Length: (cm) 10 Width: (cm) 25 Depth: (cm) 0.1 Area: (cm) 196.35 Volume: (cm) 19.635 Wound Description Classification: Full Thickness Without Exposed Support Structu Wound Margin: Flat and Intact Exudate Amount: Medium Exudate Type: Serous Exudate Color: amber res Foul Odor After Cleansing: N Slough/Fibrino N % Reduction in Area: % Reduction in Volume: Epithelialization: None Tunneling: No Undermining: No o o Wound Bed Granulation Amount: Large (67-100%) Exposed Structure Granulation Quality: Pink Fascia Exposed: No Necrotic Amount: None Present (0%) Fat Layer (Subcutaneous Tissue) Exposed: Yes Tendon Exposed: No Muscle Exposed: No Joint Exposed: No Bone Exposed: No Treatment Notes Wound #5 (Lower Leg) Wound Laterality: Right, Circumferential Cleanser Soap and Water Discharge Instruction: May shower and wash wound with dial antibacterial soap and water prior to dressing change. Peri-Wound Care Triamcinolone 15 (g) Discharge Instruction: Use triamcinolone 15 (g) as directed Sween Lotion (Moisturizing lotion) Discharge Instruction: Apply moisturizing lotion as directed Topical Primary Dressing KerraCel Ag Gelling Fiber Dressing, 4x5 in (silver alginate) Discharge Instruction: Apply silver alginate to wound bed as instructed Secondary Dressing ABD Pad, 8x10 Discharge Instruction: Apply over primary dressing as directed. Zetuvit Plus 4x8 in Discharge Instruction: Apply over primary dressing as directed. Secured With Compression Wrap ThreePress (3 layer compression wrap) Discharge Instruction: Apply three  layer compression as directed. Compression Stockings Add-Ons Electronic Signature(s) Signed: 03/08/2021 5:30:45 PM By: Levan Hurst RN, BSN Entered By: Levan Hurst on 03/08/2021 16:10:44 -------------------------------------------------------------------------------- [place] Details Patient Name: Date of Service: Jackie [name], [place]. 03/08/2021 3:30 PM Medical Record Number: [id number] Patient Account Number: [id number] Date of Birth/Sex: Treating RN: [birth year] (87 y.o. [name] Primary Care Avonne Berkery: [name] Other Clinician: Referring Tyneka Scafidi: Treating Harper Vandervoort/Extender: [name] in Treatment: 18 Vital Signs Time Taken: 15:54 Temperature (F): 98.4 Height (in): 61 Pulse (bpm): 69 Weight (lbs): 126 Respiratory Rate (breaths/min): 18 Body Mass Index (BMI): 23.8 Blood Pressure (  mmHg): 156/76 Reference Range: 80 - 120 mg / dl Electronic Signature(s) Signed: 03/10/2021 8:29:36 AM By: Sandre Kitty Entered By: Sandre Kitty on 03/08/2021 15:54:54

## 2021-03-15 ENCOUNTER — Encounter (HOSPITAL_BASED_OUTPATIENT_CLINIC_OR_DEPARTMENT_OTHER): Payer: Medicare Other | Admitting: Internal Medicine

## 2021-03-15 ENCOUNTER — Other Ambulatory Visit: Payer: Self-pay

## 2021-03-15 DIAGNOSIS — I87332 Chronic venous hypertension (idiopathic) with ulcer and inflammation of left lower extremity: Secondary | ICD-10-CM | POA: Diagnosis not present

## 2021-03-15 NOTE — Progress Notes (Signed)
[name], [name]. ([id number]) Visit Report for 03/15/2021 HPI Details Patient Name: Date of Service: Jackie [name] CE M. 03/15/2021 3:15 PM Medical Record Number: [id number] Patient Account Number: [id number] Date of Birth/Sex: Treating RN: 02-Dec-1931 (87 y.o. [name] Primary Care Provider: [name] Other Clinician: Referring Provider: Treating Provider/Extender: [name] in Treatment: 19 History of Present Illness HPI Description: ADMISSION 07/17/2020 This is an 87 year old woman who is referred by her nurse practitioner at [facility] who has been doing treatment for the wound on the right medial lower leg and ankle. Apparently 1 point a substantial wound however it is largely epithelialized now. Patient states its been there for about 5 or 6 weeks. On the background she has changes in the skin in this area I think the go back several years to when she had a left hip fracture. Nevertheless she does not have a wound history that I could elicit. As far as I know that they had been using TCA, Mepitel under an Unna boot they have been changing this weekly at [facility] [employer]. She also received a course of doxycycline. Past medical history very little that I can find in [place] link. She has a history of venous insufficiency bilaterally and a left hip fracture hypertension and a history of thyroidectomy for thyroid cancer. ABI in our clinic was 1.13 on the left 8/27; the patient is substantial initial wound by her description on the left medial lower leg and ankle is completely epithelialized. She has chronic venous insufficiency, dermatosclerosis and probably lymphedema. She does not have stockings and states that she would have trouble getting over the toe stockings on in any fashion because of hip problems except 9/3; this is a patient with chronic venous insufficiency with a particularly difficult area on her left  medial lower leg and ankle. My assessment of this is that she has chronic venous insufficiency and chronic venous hypertension. And at least on the left medial ankle stasis dermatitis. The area still looks angry and inflamed. Nevertheless it is epithelialized. I do not believe there is active infection. We have ordered her bilateral juxta lite stockings. These should be easier for her to put on but need to be adjusted to 30/40 compression. If this is not successful in maintaining this area I think she is going to need formal reflux studies to see if she might be a candidate for ablation of the greater saphenous vein 08/05/2020 upon evaluation today patient appears to be doing poorly in regard to her lower extremity. Unfortunately this has reopened after being healed on Friday. She tells me she was wearing her compression wrap but that even when she woke up in the morning on Saturday morning she was leaking. Fortunately there is no signs of active infection at this time systemically though there is some question of a thing locally. She does have juxta light compression bilaterally. 08/12/2020 on evaluation today patient appears to be doing well with regard to her leg ulcer which actually is closed again at this point. With that being said she is still having some issues currently with very thin skin that has come back over and again with her swelling if we do not wrap around afraid this is can open right back up. Nonetheless I do believe that she should proceed with the vascular testing next Thursday as previously ordered and recommended. Fortunately there is no signs of active infection at this time. 9/23; this is a patient I  discharged a few weeks ago. Apparently she had a reopening on the left leg medially. This is closed now. We have ordered venous reflux studies but they are not until 10/5 at [place] 10/29/2020 Patient is a now 87 year old female. She is somewhat frail  however still lives at home largely on her own. We had her in the clinic in August and September 2021 with a wound on the left medial lower extremity in the setting of chronic venous insufficiency and lymphedema. This closed reasonably easily and we discharged her to juxta lite stockings. After her discharge she had her venous reflux studies at interventional radiology. She was seen in consultation by Dr. Earleen Newport. She did not have evidence of thrombosis. She did have significant reflux of the bilateral greater saphenous vein on the right there was reflux from the saphenofemoral junction to the proximal calf where there is further decompression through network of varicosities on the left there was reflux from the saphenofemoral junction to the distal calf there is also a varicose network decompressing the calf. She was recommended I think for venous ablations of the greater saphenous vein on the left however she is wanting to defer that it into the new year. She tells me that very shortly after she left here the last time she was not able to put the juxta lite stocking on largely the inner sleeve. She developed swelling and then leakage and skin breakdown again in the left medial lower extremity. She was seen by podiatry on 3 occasions I think they were applying Unna boots. Her son says that at one point the [facility] boot was not put on properly his wife who is a nurse had to adjust this and the swelling is actually somewhat better. She arrives in clinic today with very significant swelling of the proximal two thirds of her lower extremity erythema superficial skin breakdown on the left medial ankle and calf and weeping edema fluid. Past medical history not much change from last time she has chronic venous insufficiency with very significant reflux and lymphedema. She states that she has trouble bending over to get the inner part of the juxta lite stocking on because of hip problems. Her ABI in this clinic  was not repeated but the last time she was here it was 1.13 she was not felt to have an arterial issue 12/9; the patient's wound on the left medial lower leg is again closed. She has chronic hemosiderin and stasis dermatitis left greater than right lipodermatosclerosis left greater than right. On the right leg today we are not wrapping she says she hit this on a screen door however there is no open area here. The right leg has a lot of swelling 12/21; this is a patient we discharged 10 days ago. She had bilateral juxta lite stockings and was supposed to be getting compression pumps from her son. According the patient her son tested positive for Covid and has been on quarantine and she is unable to get the internal part of the juxta lites on properly. She therefore has weeping edema left greater than right medial and is back in clinic. 12/28. Patient comes in with no open areas on the right leg. She still has very inflamed tenderness on the left medial ankle and lower calf. This could be cellulitis or stasis dermatitis. Also similar changes on the left lateral 1/6; patient returns to clinic. We did not wrap her right leg last time we wrapped the left. I gave her a course  of antibiotics because of tenderness on the left medial ankle possible cellulitis versus severe stasis dermatitis. Everything is taken care of on the left where the 3 layer compression was. Unfortunately on the right she has inflamed edema on the right medial ankle and right lateral ankle. Poorly controlled edema. She [place] with her juxta lite stocking on but it is not wrapped tightly enough to control the swelling. 1/13; she comes back in today after a week of compression wraps on all her wounds are healed. Her skin is dry and flaky she has stasis dermatitis of predominantly in the right medial greater than left medial lower leg extending into her foot on the right. She has a bag of stockings and related apparel including juxta lite  stockings, over the toe compression stockings, a sock Donner. She is for 1 reason or another not able to get these things on at home on her own she comes in today with her daughter from [place]. She got external compression pumps delivered the other day but has not started to use them. The other major problem is she lives alone 12/23/2020 upon evaluation today patient unfortunately presents for readmission due to issues that she is having with weeping of her lower extremities along with increased discomfort and pain. She also has increased redness. I am concerned about infection here among other things. She has not been using the compression as she tells me that this is not easy for her to do. She is also not been using her lymphedema pumps enough to even know "whether they would work or not" 2/3; patient came into the clinic last week with concern of cellulitis increasing edema with weeping. She was put on doxycycline she comes in today with dry skin once again but no wounds and no evidence of infection. She probably had stasis dermatitis. She is here with her daughter. As usual we will long discussion about how were going to arrange to have the patient put stockings on. Since she was last here she had a fall is apparently fractured L3 in the lower thoracic vertebrae. She has some discomfort. Certainly in this situation she could not be expected to put on compression stockings. She has not use the compression pumps we ordered either. 2/17; we discharge the patient 2 weeks ago with bilateral juxta lite stockings. We also suggested in-home care in the morning to help her get the stockings on with the options of being perhaps assisted living or going to live in [place] with her daughter however she is done none of the above. She called the clinic multiple times to report weeping edema. She states today than on Monday, 3 days ago her sheets were soaked with fluid when she woke up in the morning. 2/24;  patient once again today looks fine. There is no swelling no weeping edema and no wounds. She brought in her stockings. 01/28/2021 upon evaluation today patient's wounds actually are showing signs of being completely healed I do not see anything open she does have a lot of dry skin at this point unfortunately. Fortunately there is no signs of active infection however and in general I feel like she is doing quite well. No fevers, chills, nausea, vomiting, or diarrhea. 02/16/2021; patient was discharged almost 3 weeks ago. She is back in clinic today again with weeping areas on the left lateral lower leg and on the right lower leg. Severe stasis dermatitis poorly controlled edema. She apparently has been more compliant with her juxta lites although I  did not actually see these on and I am not certain that they were on with enough pressure. She does not complain of pain just the leaking fluid out of the areas 3/29; arrives in clinic today everything is closed. Her edema control is actually quite good. She has been in compression wraps however. She has marked chronic stasis dermatitis skin changes on the right dry flaking skin. 4/11; 2 weeks the patient arrives in clinic with severe edema in her right leg stasis dermatitis loss of surface epithelium almost circumferentially in the right lower leg. The left leg has some edema but no open wound area. In discussion it turns out that she does not wear her juxta lite product properly and she does not wear the inner stocking layer. We got her compression pumps but she does not seem to abuse them since they were set up according to her daughter. 4/18; this is a patient who came back to clinic last week. Both legs were put under compression due to leaking edema fluid. The left leg is completely healed today whereas the right leg still has some stasis dermatitis. There is no open wound on either side. They apparently have got her do agreed to help to come in to the  home and help her with the stockings. Electronic Signature(s) Signed: 03/15/2021 5:05:50 PM By: Linton Ham MD Entered By: Linton Ham on 03/15/2021 16:56:09 -------------------------------------------------------------------------------- Physical Exam Details Patient Name: Date of Service: Jackie [name], [place]. 03/15/2021 3:15 PM Medical Record Number: [id number] Patient Account Number: [id number] Date of Birth/Sex: Treating RN: 1932-07-11 (87 y.o. [name] Primary Care Provider: [name] Other Clinician: Referring Provider: Treating Provider/Extender: [name] in Treatment: 19 Constitutional Sitting or standing Blood Pressure is within target range for patient.. Pulse regular and within target range for patient.Marland Kitchen Respirations regular, non-labored and within target range.. Temperature is normal and within the target range for the patient.Marland Kitchen Appears in no distress. Notes Wound exam; she has no open wounds on either side. Everything is settled down on the right side although there is still some erythema but no tenderness I think this is stasis dermatitis. There is no weeping edema fluid on the right either. She still has dry flaking skin. More edema present in the right leg than the left. Electronic Signature(s) Signed: 03/15/2021 5:05:50 PM By: Linton Ham MD Entered By: Linton Ham on 03/15/2021 16:57:03 -------------------------------------------------------------------------------- Physician Orders Details Patient Name: Date of Service: Jackie [name], [place]. 03/15/2021 3:15 PM Medical Record Number: [id number] Patient Account Number: [id number] Date of Birth/Sex: Treating RN: 1932-07-19 (87 y.o. [name] Primary Care Provider: [name] Other Clinician: Referring Provider: Treating Provider/Extender: [name] in Treatment: 19 Verbal / Phone Orders: No Diagnosis Coding ICD-10  Coding Code Description [id number] Chronic venous hypertension (idiopathic) with ulcer and inflammation of left lower extremity I89.0 Lymphedema, not elsewhere classified L97.818 Non-pressure chronic ulcer of other part of right lower leg with other specified severity Follow-up Appointments Return Appointment in 1 week. Bathing/ Shower/ Hygiene May shower with protection but do not get wound dressing(s) wet. Edema Control - Lymphedema / SCD / Other Lymphedema Pumps. Use Lymphedema pumps on leg(s) 2-3 times a day for 45-60 minutes. If wearing any wraps or hose, do not remove them. Continue exercising as instructed. - Ok to use pumps over compression wraps Elevate legs to the level of the heart or above for 30 minutes daily and/or when sitting, a frequency of: -  multiple times throughout the day Avoid standing for long periods of time. Exercise regularly Non Wound Condition Left Lower Extremity Other Non Wound Condition Orders/Instructions: - Apply TCA/lotion and 3 layer compression wrap to left leg. Change weekly. Wound Treatment Wound #5 - Lower Leg Wound Laterality: Right, Circumferential Cleanser: Soap and Water 1 x Per Week/7 Days Discharge Instructions: May shower and wash wound with dial antibacterial soap and water prior to dressing change. Peri-Wound Care: Triamcinolone 15 (g) 1 x Per Week/7 Days Discharge Instructions: Use triamcinolone 15 (g) as directed Peri-Wound Care: Sween Lotion (Moisturizing lotion) 1 x Per Week/7 Days Discharge Instructions: Apply moisturizing lotion as directed Prim Dressing: KerraCel Ag Gelling Fiber Dressing, 4x5 in (silver alginate) 1 x Per Week/7 Days ary Discharge Instructions: Apply silver alginate to wound bed as instructed Secondary Dressing: ABD Pad, 8x10 1 x Per Week/7 Days Discharge Instructions: Apply over primary dressing as directed. Secondary Dressing: Zetuvit Plus 4x8 in 1 x Per Week/7 Days Discharge Instructions: Apply over primary  dressing as directed. Compression Wrap: ThreePress (3 layer compression wrap) 1 x Per Week/7 Days Discharge Instructions: Apply three layer compression as directed. Electronic Signature(s) Signed: 03/15/2021 5:05:50 PM By: Linton Ham MD Signed: 03/15/2021 5:39:31 PM By: Levan Hurst RN, BSN Entered By: Levan Hurst on 03/15/2021 16:10:02 -------------------------------------------------------------------------------- Problem List Details Patient Name: Date of Service: Jackie [name], [place]. 03/15/2021 3:15 PM Medical Record Number: [id number] Patient Account Number: [id number] Date of Birth/Sex: Treating RN: 1932-01-10 (87 y.o. [name] Primary Care Provider: [name] Other Clinician: Referring Provider: Treating Provider/Extender: [name] in Treatment: 19 Active Problems ICD-10 Encounter Code Description Active Date MDM Diagnosis I87.332 Chronic venous hypertension (idiopathic) with ulcer and inflammation of left 10/29/2020 No Yes lower extremity I89.0 Lymphedema, not elsewhere classified 10/29/2020 No Yes L97.818 Non-pressure chronic ulcer of other part of right lower leg with other specified 03/08/2021 No Yes severity Inactive Problems ICD-10 Code Description Active Date Inactive Date L97.321 Non-pressure chronic ulcer of left ankle limited to breakdown of skin 10/29/2020 10/29/2020 Resolved Problems Electronic Signature(s) Signed: 03/15/2021 5:05:50 PM By: Linton Ham MD Entered By: Linton Ham on 03/15/2021 16:54:18 -------------------------------------------------------------------------------- Progress Note Details Patient Name: Date of Service: Jackie [name], [place]. 03/15/2021 3:15 PM Medical Record Number: [id number] Patient Account Number: [id number] Date of Birth/Sex: Treating RN: 1932-01-06 (87 y.o. [name] Primary Care Provider: [name] Other Clinician: Referring Provider: Treating Provider/Extender:  [name] in Treatment: 19 Subjective History of Present Illness (HPI) ADMISSION 07/17/2020 This is an 87 year old woman who is referred by her nurse practitioner at [facility] who has been doing treatment for the wound on the right medial lower leg and ankle. Apparently 1 point a substantial wound however it is largely epithelialized now. Patient states its been there for about 5 or 6 weeks. On the background she has changes in the skin in this area I think the go back several years to when she had a left hip fracture. Nevertheless she does not have a wound history that I could elicit. As far as I know that they had been using TCA, Mepitel under an Unna boot they have been changing this weekly at [facility] [employer]. She also received a course of doxycycline. Past medical history very little that I can find in [place] link. She has a history of venous insufficiency bilaterally and a left hip fracture hypertension and a history of thyroidectomy for thyroid cancer. ABI  in our clinic was 1.13 on the left 8/27; the patient is substantial initial wound by her description on the left medial lower leg and ankle is completely epithelialized. She has chronic venous insufficiency, dermatosclerosis and probably lymphedema. She does not have stockings and states that she would have trouble getting over the toe stockings on in any fashion because of hip problems except 9/3; this is a patient with chronic venous insufficiency with a particularly difficult area on her left medial lower leg and ankle. My assessment of this is that she has chronic venous insufficiency and chronic venous hypertension. And at least on the left medial ankle stasis dermatitis. The area still looks angry and inflamed. Nevertheless it is epithelialized. I do not believe there is active infection. We have ordered her bilateral juxta lite stockings. These should be easier for her  to put on but need to be adjusted to 30/40 compression. If this is not successful in maintaining this area I think she is going to need formal reflux studies to see if she might be a candidate for ablation of the greater saphenous vein 08/05/2020 upon evaluation today patient appears to be doing poorly in regard to her lower extremity. Unfortunately this has reopened after being healed on Friday. She tells me she was wearing her compression wrap but that even when she woke up in the morning on Saturday morning she was leaking. Fortunately there is no signs of active infection at this time systemically though there is some question of a thing locally. She does have juxta light compression bilaterally. 08/12/2020 on evaluation today patient appears to be doing well with regard to her leg ulcer which actually is closed again at this point. With that being said she is still having some issues currently with very thin skin that has come back over and again with her swelling if we do not wrap around afraid this is can open right back up. Nonetheless I do believe that she should proceed with the vascular testing next Thursday as previously ordered and recommended. Fortunately there is no signs of active infection at this time. 9/23; this is a patient I discharged a few weeks ago. Apparently she had a reopening on the left leg medially. This is closed now. We have ordered venous reflux studies but they are not until 10/5 at [place] 10/29/2020 Patient is a now 86 year old female. She is somewhat frail however still lives at home largely on her own. We had her in the clinic in August and September 2021 with a wound on the left medial lower extremity in the setting of chronic venous insufficiency and lymphedema. This closed reasonably easily and we discharged her to juxta lite stockings. After her discharge she had her venous reflux studies at interventional radiology. She was seen in  consultation by Dr. Earleen Newport. She did not have evidence of thrombosis. She did have significant reflux of the bilateral greater saphenous vein on the right there was reflux from the saphenofemoral junction to the proximal calf where there is further decompression through network of varicosities on the left there was reflux from the saphenofemoral junction to the distal calf there is also a varicose network decompressing the calf. She was recommended I think for venous ablations of the greater saphenous vein on the left however she is wanting to defer that it into the new year. She tells me that very shortly after she left here the last time she was not able to put the juxta lite stocking on  largely the inner sleeve. She developed swelling and then leakage and skin breakdown again in the left medial lower extremity. She was seen by podiatry on 3 occasions I think they were applying Unna boots. Her son says that at one point the [facility] boot was not put on properly his wife who is a nurse had to adjust this and the swelling is actually somewhat better. She arrives in clinic today with very significant swelling of the proximal two thirds of her lower extremity erythema superficial skin breakdown on the left medial ankle and calf and weeping edema fluid. Past medical history not much change from last time she has chronic venous insufficiency with very significant reflux and lymphedema. She states that she has trouble bending over to get the inner part of the juxta lite stocking on because of hip problems. Her ABI in this clinic was not repeated but the last time she was here it was 1.13 she was not felt to have an arterial issue 12/9; the patient's wound on the left medial lower leg is again closed. She has chronic hemosiderin and stasis dermatitis left greater than right lipodermatosclerosis left greater than right. On the right leg today we are not wrapping she says she hit this on a screen door however there  is no open area here. The right leg has a lot of swelling 12/21; this is a patient we discharged 10 days ago. She had bilateral juxta lite stockings and was supposed to be getting compression pumps from her son. According the patient her son tested positive for Covid and has been on quarantine and she is unable to get the internal part of the juxta lites on properly. She therefore has weeping edema left greater than right medial and is back in clinic. 12/28. Patient comes in with no open areas on the right leg. She still has very inflamed tenderness on the left medial ankle and lower calf. This could be cellulitis or stasis dermatitis. Also similar changes on the left lateral 1/6; patient returns to clinic. We did not wrap her right leg last time we wrapped the left. I gave her a course of antibiotics because of tenderness on the left medial ankle possible cellulitis versus severe stasis dermatitis. Everything is taken care of on the left where the 3 layer compression was. Unfortunately on the right she has inflamed edema on the right medial ankle and right lateral ankle. Poorly controlled edema. She [place] with her juxta lite stocking on but it is not wrapped tightly enough to control the swelling. 1/13; she comes back in today after a week of compression wraps on all her wounds are healed. Her skin is dry and flaky she has stasis dermatitis of predominantly in the right medial greater than left medial lower leg extending into her foot on the right. She has a bag of stockings and related apparel including juxta lite stockings, over the toe compression stockings, a sock Donner. She is for 1 reason or another not able to get these things on at home on her own she comes in today with her daughter from [place]. She got external compression pumps delivered the other day but has not started to use them. The other major problem is she lives alone 12/23/2020 upon evaluation today patient unfortunately  presents for readmission due to issues that she is having with weeping of her lower extremities along with increased discomfort and pain. She also has increased redness. I am concerned about infection here among other things. She  has not been using the compression as she tells me that this is not easy for her to do. She is also not been using her lymphedema pumps enough to even know "whether they would work or not" 2/3; patient came into the clinic last week with concern of cellulitis increasing edema with weeping. She was put on doxycycline she comes in today with dry skin once again but no wounds and no evidence of infection. She probably had stasis dermatitis. She is here with her daughter. As usual we will long discussion about how were going to arrange to have the patient put stockings on. Since she was last here she had a fall is apparently fractured L3 in the lower thoracic vertebrae. She has some discomfort. Certainly in this situation she could not be expected to put on compression stockings. She has not use the compression pumps we ordered either. 2/17; we discharge the patient 2 weeks ago with bilateral juxta lite stockings. We also suggested in-home care in the morning to help her get the stockings on with the options of being perhaps assisted living or going to live in [place] with her daughter however she is done none of the above. She called the clinic multiple times to report weeping edema. She states today than on Monday, 3 days ago her sheets were soaked with fluid when she woke up in the morning. 2/24; patient once again today looks fine. There is no swelling no weeping edema and no wounds. She brought in her stockings. 01/28/2021 upon evaluation today patient's wounds actually are showing signs of being completely healed I do not see anything open she does have a lot of dry skin at this point unfortunately. Fortunately there is no signs of active infection however and in general I  feel like she is doing quite well. No fevers, chills, nausea, vomiting, or diarrhea. 02/16/2021; patient was discharged almost 3 weeks ago. She is back in clinic today again with weeping areas on the left lateral lower leg and on the right lower leg. Severe stasis dermatitis poorly controlled edema. She apparently has been more compliant with her juxta lites although I did not actually see these on and I am not certain that they were on with enough pressure. She does not complain of pain just the leaking fluid out of the areas 3/29; arrives in clinic today everything is closed. Her edema control is actually quite good. She has been in compression wraps however. She has marked chronic stasis dermatitis skin changes on the right dry flaking skin. 4/11; 2 weeks the patient arrives in clinic with severe edema in her right leg stasis dermatitis loss of surface epithelium almost circumferentially in the right lower leg. The left leg has some edema but no open wound area. In discussion it turns out that she does not wear her juxta lite product properly and she does not wear the inner stocking layer. We got her compression pumps but she does not seem to abuse them since they were set up according to her daughter. 4/18; this is a patient who came back to clinic last week. Both legs were put under compression due to leaking edema fluid. The left leg is completely healed today whereas the right leg still has some stasis dermatitis. There is no open wound on either side. They apparently have got her do agreed to help to come in to the home and help her with the stockings. Objective Constitutional Sitting or standing Blood Pressure is within target range for  patient.. Pulse regular and within target range for patient.Marland Kitchen Respirations regular, non-labored and within target range.. Temperature is normal and within the target range for the patient.Marland Kitchen Appears in no distress. Vitals Time Taken: 3:15 PM, Height: 61 in,  Weight: 126 lbs, BMI: 23.8, Temperature: 97.5 F, Pulse: 65 bpm, Respiratory Rate: 18 breaths/min, Blood Pressure: 139/84 mmHg. General Notes: Wound exam; she has no open wounds on either side. Everything is settled down on the right side although there is still some erythema but no tenderness I think this is stasis dermatitis. There is no weeping edema fluid on the right either. She still has dry flaking skin. More edema present in the right leg than the left. Integumentary (Hair, Skin) Wound #5 status is Open. Original cause of wound was Gradually Appeared. The date acquired was: 03/08/2021. The wound has been in treatment 1 weeks. The wound is located on the Right,Circumferential Lower Leg. The wound measures 4cm length x 4cm width x 0.1cm depth; 12.566cm^2 area and 1.257cm^3 volume. There is Fat Layer (Subcutaneous Tissue) exposed. There is no tunneling or undermining noted. There is a medium amount of serous drainage noted. The wound margin is flat and intact. There is large (67-100%) pink granulation within the wound bed. There is no necrotic tissue within the wound bed. Assessment Active Problems ICD-10 Chronic venous hypertension (idiopathic) with ulcer and inflammation of left lower extremity Lymphedema, not elsewhere classified Non-pressure chronic ulcer of other part of right lower leg with other specified severity Procedures Wound #5 Pre-procedure diagnosis of Wound #5 is a Lymphedema located on the Right,Circumferential Lower Leg . There was a Three Layer Compression Therapy Procedure by Levan Hurst, RN. Post procedure Diagnosis Wound #5: Same as Pre-Procedure There was a Three Layer Compression Therapy Procedure by Levan Hurst, RN. Post procedure Diagnosis Wound #: Same as Pre-Procedure Plan Follow-up Appointments: Return Appointment in 1 week. Bathing/ Shower/ Hygiene: May shower with protection but do not get wound dressing(s) wet. Edema Control - Lymphedema / SCD /  Other: Lymphedema Pumps. Use Lymphedema pumps on leg(s) 2-3 times a day for 45-60 minutes. If wearing any wraps or hose, do not remove them. Continue exercising as instructed. - Ok to use pumps over compression wraps Elevate legs to the level of the heart or above for 30 minutes daily and/or when sitting, a frequency of: - multiple times throughout the day Avoid standing for long periods of time. Exercise regularly Non Wound Condition: Other Non Wound Condition Orders/Instructions: - Apply TCA/lotion and 3 layer compression wrap to left leg. Change weekly. WOUND #5: - Lower Leg Wound Laterality: Right, Circumferential Cleanser: Soap and Water 1 x Per Week/7 Days Discharge Instructions: May shower and wash wound with dial antibacterial soap and water prior to dressing change. Peri-Wound Care: Triamcinolone 15 (g) 1 x Per Week/7 Days Discharge Instructions: Use triamcinolone 15 (g) as directed Peri-Wound Care: Sween Lotion (Moisturizing lotion) 1 x Per Week/7 Days Discharge Instructions: Apply moisturizing lotion as directed Prim Dressing: KerraCel Ag Gelling Fiber Dressing, 4x5 in (silver alginate) 1 x Per Week/7 Days ary Discharge Instructions: Apply silver alginate to wound bed as instructed Secondary Dressing: ABD Pad, 8x10 1 x Per Week/7 Days Discharge Instructions: Apply over primary dressing as directed. Secondary Dressing: Zetuvit Plus 4x8 in 1 x Per Week/7 Days Discharge Instructions: Apply over primary dressing as directed. Com pression Wrap: ThreePress (3 layer compression wrap) 1 x Per Week/7 Days Discharge Instructions: Apply three layer compression as directed. 1. We are going to put Livedo  steroids especially on the right leg under 3 layer compression bilaterally 2. They are going to bring their stockings next week and perhaps we will go over how to put them on with the new caregiver. 3. My thought was that she has not been able to put these on with enough pressure to maintain  fluid control. 4. She has compression pumps but I am really not sure whether she has ever use them. Electronic Signature(s) Signed: 03/15/2021 5:05:50 PM By: Linton Ham MD Entered By: Linton Ham on 03/15/2021 16:58:27 -------------------------------------------------------------------------------- SuperBill Details Patient Name: Date of Service: Jackie [name], [place]. 03/15/2021 Medical Record Number: [id number] Patient Account Number: [id number] Date of Birth/Sex: Treating RN: Jun 11, 1932 (87 y.o. [name] Primary Care Provider: [name] Other Clinician: Referring Provider: Treating Provider/Extender: [name] in Treatment: 19 Diagnosis Coding ICD-10 Codes Code Description [id number] Chronic venous hypertension (idiopathic) with ulcer and inflammation of left lower extremity I89.0 Lymphedema, not elsewhere classified L97.818 Non-pressure chronic ulcer of other part of right lower leg with other specified severity Facility Procedures CPT4: Code [id number] Description: 81 BILATERAL: Application of multi-layer venous compression system; leg (below knee), including ankle and t. Modifier: Quantity: 1 Physician Procedures : CPT4 Code Description Modifier [id number] - WC PHYS LEVEL 3 - EST PT ICD-10 Diagnosis Description I87.332 Chronic venous hypertension (idiopathic) with ulcer and inflammation of left lower extremity I89.0 Lymphedema, not elsewhere classified L97.818  Non-pressure chronic ulcer of other part of right lower leg with other specified severity Quantity: 1 Electronic Signature(s) Signed: 03/15/2021 5:05:50 PM By: Linton Ham MD Entered By: Linton Ham on 03/15/2021 16:58:46

## 2021-03-16 NOTE — Progress Notes (Signed)
Jackie, [name] [place]. ([id number]) Visit Report for 03/15/2021 Arrival Information Details Patient Name: Date of Service: Jackie [name] CE M. 03/15/2021 3:15 PM Medical Record Number: [id number] Patient Account Number: [id number] Date of Birth/Sex: Treating RN: [birth year] (87 y.o. [name] Primary Care Deyanira Fesler: [name] Other Clinician: Referring Halen Mossbarger: Treating Dexton Zwilling/Extender: [name] in Treatment: 19 Visit Information History Since Last Visit Added or deleted any medications: No Patient Arrived: Jackie [name] Any new allergies or adverse reactions: No Arrival Time: 15:12 Had a fall or experienced change in No Accompanied By: son activities of daily living that may affect Transfer Assistance: None risk of falls: Patient Identification Verified: Yes Signs or symptoms of abuse/neglect since last visito No Secondary Verification Process Completed: Yes Hospitalized since last visit: No Patient Requires Transmission-Based Precautions: No Implantable device outside of the clinic excluding No Patient Has Alerts: Yes cellular tissue based products placed in the center Patient Alerts: ABI: L 1.13 07/2020 since last visit: Has Dressing in Place as Prescribed: Yes Has Compression in Place as Prescribed: Yes Pain Present Now: No Electronic Signature(s) Signed: 03/15/2021 5:00:15 PM By: Lorrin Jackson Entered By: Lorrin Jackson on 03/15/2021 15:15:10 -------------------------------------------------------------------------------- Compression Therapy Details Patient Name: Date of Service: Jackie [name], [place]. 03/15/2021 3:15 PM Medical Record Number: [id number] Patient Account Number: [id number] Date of Birth/Sex: Treating RN: [birth year] (87 y.o. [name] Primary Care Kiron Osmun: [name] Other Clinician: Referring Ardith Lewman: Treating Kameran Lallier/Extender: [name] in Treatment: 19 Compression Therapy Performed for  Wound Assessment: Wound #5 Right,Circumferential Lower Leg Performed By: Clinician Levan Hurst, RN Compression Type: Three Layer Post Procedure Diagnosis Same as Pre-procedure Electronic Signature(s) Signed: 03/15/2021 5:39:31 PM By: Levan Hurst RN, BSN Entered By: Levan Hurst on 03/15/2021 16:10:29 -------------------------------------------------------------------------------- Compression Therapy Details Patient Name: Date of Service: Jackie [name], [place]. 03/15/2021 3:15 PM Medical Record Number: [id number] Patient Account Number: [id number] Date of Birth/Sex: Treating RN: 28-Oct-1932 (87 y.o. [name] Primary Care Horatio Bertz: [name] Other Clinician: Referring Desarai Barrack: Treating Orest Dygert/Extender: [name] in Treatment: 19 Compression Therapy Performed for Wound Assessment: NonWound Condition Lymphedema - Left Leg Performed By: Clinician Levan Hurst, RN Compression Type: Three Layer Post Procedure Diagnosis Same as Pre-procedure Electronic Signature(s) Signed: 03/15/2021 5:39:31 PM By: Levan Hurst RN, BSN Entered By: Levan Hurst on 03/15/2021 16:10:41 -------------------------------------------------------------------------------- Encounter Discharge Information Details Patient Name: Date of Service: Jackie [name], [place]. 03/15/2021 3:15 PM Medical Record Number: [id number] Patient Account Number: [id number] Date of Birth/Sex: Treating RN: 12/11/1931 (87 y.o. [name] Primary Care Mohid Furuya: [name] Other Clinician: Referring Baylen Dea: Treating Mitsuye Schrodt/Extender: [name] in Treatment: 19 Encounter Discharge Information Items Discharge Condition: Stable Ambulatory Status: Cane Discharge Destination: Home Transportation: Private Auto Accompanied By: Son Schedule Follow-up Appointment: Yes Clinical Summary of Care: Provided on 03/15/2021 Form Type Recipient Paper Patient  Patient Electronic Signature(s) Signed: 03/15/2021 5:01:54 PM By: Lorrin Jackson Entered By: Lorrin Jackson on 03/15/2021 17:01:54 -------------------------------------------------------------------------------- Lower Extremity Assessment Details Patient Name: Date of Service: Jackie [name], [place]. 03/15/2021 3:15 PM Medical Record Number: [id number] Patient Account Number: [id number] Date of Birth/Sex: Treating RN: 1932/07/10 (87 y.o. [name] Primary Care Jojo Pehl: [name] Other Clinician: Referring Denym Christenberry: Treating Chelisa Hennen/Extender: [name] in Treatment: 19 Edema Assessment Assessed: Jackie [name]: Yes] [name]: Yes] Edema: [Left: Yes] [Right: Yes] Calf Left: Right: Point of Measurement: 38 cm From Medial Instep 31 cm 31.5  cm Ankle Left: Right: Point of Measurement: 9 cm From Medial Instep 21.5 cm 21 cm Vascular Assessment Pulses: Dorsalis Pedis Palpable: [Left:Yes] [Right:Yes] Electronic Signature(s) Signed: 03/15/2021 5:00:15 PM By: Lorrin Jackson Entered By: Lorrin Jackson on 03/15/2021 15:27:32 -------------------------------------------------------------------------------- Multi Wound Chart Details Patient Name: Date of Service: Jackie [name], [place]. 03/15/2021 3:15 PM Medical Record Number: [id number] Patient Account Number: [id number] Date of Birth/Sex: Treating RN: August 04, 1932 (87 y.o. [name] Primary Care Shariyah Eland: [name] Other Clinician: Referring Firas Guardado: Treating Zyia Kaneko/Extender: [name] in Treatment: 19 Vital Signs Height(in): 61 Pulse(bpm): 65 Weight(lbs): 126 Blood Pressure(mmHg): 139/84 Body Mass Index(BMI): 24 Temperature(F): 97.5 Respiratory Rate(breaths/min): 18 Photos: [5:No Photos Right, Circumferential Lower Leg] [N/A:N/A N/A] Wound Location: [5:Gradually Appeared] [N/A:N/A] Wounding Event: [5:Lymphedema] [N/A:N/A] Primary Etiology: [5:Hypertension]  [N/A:N/A] Comorbid History: [5:03/08/2021] [N/A:N/A] Date Acquired: [5:1] [N/A:N/A] Weeks of Treatment: [5:Open] [N/A:N/A] Wound Status: [5:4x4x0.1] [N/A:N/A] Measurements L x W x D (cm) [5:12.566] [N/A:N/A] A (cm) : rea [5:1.257] [N/A:N/A] Volume (cm) : [5:93.60%] [N/A:N/A] % Reduction in A rea: [5:93.60%] [N/A:N/A] % Reduction in Volume: [5:Full Thickness Without Exposed] [N/A:N/A] Classification: [5:Support Structures Medium] [N/A:N/A] Exudate Amount: [5:Serous] [N/A:N/A] Exudate Type: [5:amber] [N/A:N/A] Exudate Color: [5:Flat and Intact] [N/A:N/A] Wound Margin: [5:Large (67-100%)] [N/A:N/A] Granulation Amount: [5:Pink] [N/A:N/A] Granulation Quality: [5:None Present (0%)] [N/A:N/A] Necrotic Amount: [5:Fat Layer (Subcutaneous Tissue): Yes N/A] Exposed Structures: [5:Fascia: No Tendon: No Muscle: No Joint: No Bone: No Medium (34-66%)] [N/A:N/A] Epithelialization: [5:Compression Therapy] [N/A:N/A] Treatment Notes Electronic Signature(s) Signed: 03/15/2021 5:05:50 PM By: Linton Ham MD Signed: 03/15/2021 5:39:31 PM By: Levan Hurst RN, BSN Entered By: Linton Ham on 03/15/2021 16:54:23 -------------------------------------------------------------------------------- Multi-Disciplinary Care Plan Details Patient Name: Date of Service: Jackie [name], Jackie [name]. 03/15/2021 3:15 PM Medical Record Number: [id number] Patient Account Number: [id number] Date of Birth/Sex: Treating RN: [birth year] (87 y.o. [name] Primary Care Nyshaun Standage: [name] Other Clinician: Referring Zella Dewan: Treating Celina Shiley/Extender: [name] in Treatment: 19 Multidisciplinary Care Plan reviewed with physician Active Inactive Venous Leg Ulcer Nursing Diagnoses: Actual venous Insuffiency (use after diagnosis is confirmed) Knowledge deficit related to disease process and management Potential for venous Insuffiency (use before diagnosis confirmed) Goals: Patient  will maintain optimal edema control Date Initiated: 12/23/2020 Target Resolution Date: 04/09/2021 Goal Status: Active Interventions: Assess peripheral edema status every visit. Compression as ordered Provide education on venous insufficiency Treatment Activities: T ordered outside of clinic : 12/23/2020 est Therapeutic compression applied : 12/23/2020 Notes: Wound/Skin Impairment Nursing Diagnoses: Knowledge deficit related to ulceration/compromised skin integrity Goals: Patient/caregiver will verbalize understanding of skin care regimen Date Initiated: 10/29/2020 Target Resolution Date: 04/09/2021 Goal Status: Active Interventions: Assess patient/caregiver ability to obtain necessary supplies Assess patient/caregiver ability to perform ulcer/skin care regimen upon admission and as needed Provide education on ulcer and skin care Treatment Activities: Skin care regimen initiated : 10/29/2020 Topical wound management initiated : 10/29/2020 Notes: Electronic Signature(s) Signed: 03/15/2021 5:39:31 PM By: Levan Hurst RN, BSN Entered By: Levan Hurst on 03/15/2021 15:18:29 -------------------------------------------------------------------------------- Pain Assessment Details Patient Name: Date of Service: Jackie [name], [place]. 03/15/2021 3:15 PM Medical Record Number: [id number] Patient Account Number: [id number] Date of Birth/Sex: Treating RN: Jun 26, 1932 (87 y.o. [name] Primary Care Amandeep Nesmith: [name] Other Clinician: Referring Ciarrah Rae: Treating Alezandra Egli/Extender: [name] in Treatment: 19 Active Problems Location of Pain Severity and Description of Pain Patient Has Paino No Site Locations Pain Management and Medication Current Pain Management: Electronic Signature(s) Signed: 03/15/2021 5:00:15  PM By: Fara Chute By: Lorrin Jackson on 03/15/2021  15:26:33 -------------------------------------------------------------------------------- Patient/Caregiver Education Details Patient Name: Date of Service: Jackie [name] CE M. 4/18/2022andnbsp3:15 PM Medical Record Number: [id number] Patient Account Number: [id number] Date of Birth/Gender: Treating RN: [birth year] (87 y.o. [name] Primary Care Physician: [name] Other Clinician: Referring Physician: Treating Physician/Extender: [name] in Treatment: 19 Education Assessment Education Provided To: Patient Education Topics Provided Wound/Skin Impairment: Methods: Explain/Verbal Responses: State content correctly [employer]) Signed: 03/15/2021 5:39:31 PM By: Levan Hurst RN, BSN Entered By: Levan Hurst on 03/15/2021 15:18:44 -------------------------------------------------------------------------------- Wound Assessment Details Patient Name: Date of Service: Jackie [name], [place]. 03/15/2021 3:15 PM Medical Record Number: [id number] Patient Account Number: [id number] Date of Birth/Sex: Treating RN: Apr 06, 1932 (87 y.o. [name] Primary Care Merl Guardino: [name] Other Clinician: Referring Cerita Rabelo: Treating Marsden Zaino/Extender: [name] in Treatment: 19 Wound Status Wound Number: 5 Primary Etiology: Lymphedema Wound Location: Right, Circumferential Lower Leg Wound Status: Open Wounding Event: Gradually Appeared Comorbid History: Hypertension Date Acquired: 03/08/2021 Weeks Of Treatment: 1 Clustered Wound: No Photos Wound Measurements Length: (cm) 4 Width: (cm) 4 Depth: (cm) 0.1 Area: (cm) 12.566 Volume: (cm) 1.257 % Reduction in Area: 93.6% % Reduction in Volume: 93.6% Epithelialization: Medium (34-66%) Tunneling: No Undermining: No Wound Description Classification: Full Thickness Without Exposed Support Structures Wound Margin: Flat and Intact Exudate Amount:  Medium Exudate Type: Serous Exudate Color: amber Foul Odor After Cleansing: No Slough/Fibrino No Wound Bed Granulation Amount: Large (67-100%) Exposed Structure Granulation Quality: Pink Fascia Exposed: No Necrotic Amount: None Present (0%) Fat Layer (Subcutaneous Tissue) Exposed: Yes Tendon Exposed: No Muscle Exposed: No Joint Exposed: No Bone Exposed: No Treatment Notes Wound #5 (Lower Leg) Wound Laterality: Right, Circumferential Cleanser Soap and Water Discharge Instruction: May shower and wash wound with dial antibacterial soap and water prior to dressing change. Peri-Wound Care Triamcinolone 15 (g) Discharge Instruction: Use triamcinolone 15 (g) as directed Sween Lotion (Moisturizing lotion) Discharge Instruction: Apply moisturizing lotion as directed Topical Primary Dressing KerraCel Ag Gelling Fiber Dressing, 4x5 in (silver alginate) Discharge Instruction: Apply silver alginate to wound bed as instructed Secondary Dressing ABD Pad, 8x10 Discharge Instruction: Apply over primary dressing as directed. Zetuvit Plus 4x8 in Discharge Instruction: Apply over primary dressing as directed. Secured With Compression Wrap ThreePress (3 layer compression wrap) Discharge Instruction: Apply three layer compression as directed. Compression Stockings Add-Ons Electronic Signature(s) Signed: 03/15/2021 5:00:15 PM By: Lorrin Jackson Signed: 03/16/2021 8:07:32 AM By: Sandre Kitty Entered By: Sandre Kitty on 03/15/2021 16:54:23 -------------------------------------------------------------------------------- Vitals Details Patient Name: Date of Service: Jackie [name], [place]. 03/15/2021 3:15 PM Medical Record Number: [id number] Patient Account Number: [id number] Date of Birth/Sex: Treating RN: Mar 09, 1932 (87 y.o. [name] Primary Care Jameah Rouser: [name] Other Clinician: Referring Destiney Sanabia: Treating Evelina Lore/Extender: [name] in  Treatment: 19 Vital Signs Time Taken: 15:15 Temperature (F): 97.5 Height (in): 61 Pulse (bpm): 65 Weight (lbs): 126 Respiratory Rate (breaths/min): 18 Body Mass Index (BMI): 23.8 Blood Pressure (mmHg): 139/84 Reference Range: 80 - 120 mg / dl Electronic Signature(s) Signed: 03/15/2021 5:00:15 PM By: Lorrin Jackson Entered By: Lorrin Jackson on 03/15/2021 15:18:07

## 2021-03-22 ENCOUNTER — Other Ambulatory Visit: Payer: Self-pay

## 2021-03-22 ENCOUNTER — Encounter (HOSPITAL_BASED_OUTPATIENT_CLINIC_OR_DEPARTMENT_OTHER): Payer: Medicare Other | Admitting: Internal Medicine

## 2021-03-22 ENCOUNTER — Encounter: Payer: Self-pay | Admitting: Podiatry

## 2021-03-22 ENCOUNTER — Ambulatory Visit: Payer: Medicare Other | Admitting: Podiatry

## 2021-03-22 DIAGNOSIS — B351 Tinea unguium: Secondary | ICD-10-CM

## 2021-03-22 DIAGNOSIS — I872 Venous insufficiency (chronic) (peripheral): Secondary | ICD-10-CM

## 2021-03-22 DIAGNOSIS — M79675 Pain in left toe(s): Secondary | ICD-10-CM | POA: Diagnosis not present

## 2021-03-22 DIAGNOSIS — L309 Dermatitis, unspecified: Secondary | ICD-10-CM | POA: Diagnosis not present

## 2021-03-22 DIAGNOSIS — M79674 Pain in right toe(s): Secondary | ICD-10-CM

## 2021-03-22 DIAGNOSIS — R609 Edema, unspecified: Secondary | ICD-10-CM

## 2021-03-22 MED ORDER — CLOTRIMAZOLE-BETAMETHASONE 1-0.05 % EX CREA: 1.0000 "application " | TOPICAL_CREAM | Freq: Two times a day (BID) | CUTANEOUS | 0 refills | Status: DC

## 2021-03-22 NOTE — Progress Notes (Signed)
This patient returns to my office for at risk foot care.  This patient requires this care by a professional since this patient will be at risk due to having venous stasis  Legs  B/L.  This patient is unable to cut nails herself since the patient cannot reach her nails.These nails are painful walking and wearing shoes. This patient presents to the office with her daughter.  She also has red inflamed skin on the top of her 1,2 and 3 toes right foot.  They have been applying eucerin to the inflamed skin.   This patient presents for at risk foot care today.  Patient has been treated for her leg swelling/lymphedema  B/L.  General Appearance  Alert, conversant and in no acute stress.  Vascular  Dorsalis pedis and posterior tibial  pulses are weakly palpable  bilaterally.  Capillary return is within normal limits  Bilaterally.Cold feet  Bilaterally.  Absent digital hair  B/L.  Venous stasis legs  B/L.  Neurologic  Senn-Weinstein monofilament wire test within normal limits  bilaterally. Muscle power within normal limits bilaterally.  Nails Thick disfigured discolored nails with subungual debris  from hallux to fifth toes bilaterally. No evidence of bacterial infection or drainage bilaterally.  Orthopedic  No limitations of motion  feet .  No crepitus or effusions noted.  No bony pathology or digital deformities noted.  Skin  normotropic skin with no porokeratosis noted bilaterally.  No signs of infections or ulcers noted.   Red inflamed swollen skin on dorsum of 1,2 and 3 right foot.  Onychomycosis  Pain in right toes  Pain in left toes  Consent was obtained for treatment procedures.   Mechanical debridement of nails 1-5  bilaterally performed with a nail nipper.  Filed with dremel without incident. Prescribe lotrisone for this patient.   Return office visit  9 weeks                  Told patient to return for periodic foot care and evaluation due to potential at risk complications. If the skin worsens  she should call the office.   Gardiner Barefoot DPM

## 2021-03-23 ENCOUNTER — Encounter (HOSPITAL_BASED_OUTPATIENT_CLINIC_OR_DEPARTMENT_OTHER): Payer: Medicare Other | Admitting: Internal Medicine

## 2021-03-23 DIAGNOSIS — I87322 Chronic venous hypertension (idiopathic) with inflammation of left lower extremity: Secondary | ICD-10-CM

## 2021-03-23 DIAGNOSIS — L97818 Non-pressure chronic ulcer of other part of right lower leg with other specified severity: Secondary | ICD-10-CM | POA: Diagnosis not present

## 2021-03-23 DIAGNOSIS — I87332 Chronic venous hypertension (idiopathic) with ulcer and inflammation of left lower extremity: Secondary | ICD-10-CM | POA: Diagnosis not present

## 2021-03-23 DIAGNOSIS — I890 Lymphedema, not elsewhere classified: Secondary | ICD-10-CM | POA: Diagnosis not present

## 2021-03-23 NOTE — Progress Notes (Signed)
Jackie [name], Jackie [name] [place]. ([id number]) Visit Report for 03/23/2021 Chief Complaint Document Details Patient Name: Date of Service: Jackie [name] CE M. 03/23/2021 3:00 PM Medical Record Number: [id number] Patient Account Number: [id number] Date of Birth/Sex: Treating RN: Dec 31, 1931 (87 y.o. Jackie [name] Primary Care Provider: [name] Other Clinician: Referring Provider: Treating Provider/Extender: [name] in Treatment: 20 Information Obtained from: Patient Chief Complaint 07/17/2020; patient is here for review of a wound on her left medial ankle and lower leg Electronic Signature(s) Signed: 03/23/2021 6:00:49 PM By: Kalman Shan DO Entered By: Kalman Shan on 03/23/2021 17:55:24 -------------------------------------------------------------------------------- HPI Details Patient Name: Date of Service: Jackie [name], [place]. 03/23/2021 3:00 PM Medical Record Number: [id number] Patient Account Number: [id number] Date of Birth/Sex: Treating RN: [birth year] (87 y.o. Jackie [name] Primary Care Provider: [name] Other Clinician: Referring Provider: Treating Provider/Extender: [name] in Treatment: 20 History of Present Illness HPI Description: ADMISSION 07/17/2020 This is an 87 year old woman who is referred by her nurse practitioner at [facility] who has been doing treatment for the wound on the right medial lower leg and ankle. Apparently 1 point a substantial wound however it is largely epithelialized now. Patient states its been there for about 5 or 6 weeks. On the background she has changes in the skin in this area I think the go back several years to when she had a left hip fracture. Nevertheless she does not have a wound history that I could elicit. As far as I know that they had been using TCA, Mepitel under an Unna boot they have been changing this weekly at [facility] [employer]. She  also received a course of doxycycline. Past medical history very little that I can find in [place] link. She has a history of venous insufficiency bilaterally and a left hip fracture hypertension and a history of thyroidectomy for thyroid cancer. ABI in our clinic was 1.13 on the left 8/27; the patient is substantial initial wound by her description on the left medial lower leg and ankle is completely epithelialized. She has chronic venous insufficiency, dermatosclerosis and probably lymphedema. She does not have stockings and states that she would have trouble getting over the toe stockings on in any fashion because of hip problems except 9/3; this is a patient with chronic venous insufficiency with a particularly difficult area on her left medial lower leg and ankle. My assessment of this is that she has chronic venous insufficiency and chronic venous hypertension. And at least on the left medial ankle stasis dermatitis. The area still looks angry and inflamed. Nevertheless it is epithelialized. I do not believe there is active infection. We have ordered her bilateral juxta lite stockings. These should be easier for her to put on but need to be adjusted to 30/40 compression. If this is not successful in maintaining this area I think she is going to need formal reflux studies to see if she might be a candidate for ablation of the greater saphenous vein 08/05/2020 upon evaluation today patient appears to be doing poorly in regard to her lower extremity. Unfortunately this has reopened after being healed on Friday. She tells me she was wearing her compression wrap but that even when she woke up in the morning on Saturday morning she was leaking. Fortunately there is no signs of active infection at this time systemically though there is some question of a thing locally. She does have juxta light compression bilaterally. 08/12/2020 on  evaluation today patient appears to be doing well with regard to  her leg ulcer which actually is closed again at this point. With that being said she is still having some issues currently with very thin skin that has come back over and again with her swelling if we do not wrap around afraid this is can open right back up. Nonetheless I do believe that she should proceed with the vascular testing next Thursday as previously ordered and recommended. Fortunately there is no signs of active infection at this time. 9/23; this is a patient I discharged a few weeks ago. Apparently she had a reopening on the left leg medially. This is closed now. We have ordered venous reflux studies but they are not until 10/5 at [place] 10/29/2020 Patient is a now 87 year old female. She is somewhat frail however still lives at home largely on her own. We had her in the clinic in August and September 2021 with a wound on the left medial lower extremity in the setting of chronic venous insufficiency and lymphedema. This closed reasonably easily and we discharged her to juxta lite stockings. After her discharge she had her venous reflux studies at interventional radiology. She was seen in consultation by Dr. Earleen Newport. She did not have evidence of thrombosis. She did have significant reflux of the bilateral greater saphenous vein on the right there was reflux from the saphenofemoral junction to the proximal calf where there is further decompression through network of varicosities on the left there was reflux from the saphenofemoral junction to the distal calf there is also a varicose network decompressing the calf. She was recommended I think for venous ablations of the greater saphenous vein on the left however she is wanting to defer that it into the new year. She tells me that very shortly after she left here the last time she was not able to put the juxta lite stocking on largely the inner sleeve. She developed swelling and then leakage and skin breakdown again in  the left medial lower extremity. She was seen by podiatry on 3 occasions I think they were applying Unna boots. Her son says that at one point the [facility] boot was not put on properly his wife who is a nurse had to adjust this and the swelling is actually somewhat better. She arrives in clinic today with very significant swelling of the proximal two thirds of her lower extremity erythema superficial skin breakdown on the left medial ankle and calf and weeping edema fluid. Past medical history not much change from last time she has chronic venous insufficiency with very significant reflux and lymphedema. She states that she has trouble bending over to get the inner part of the juxta lite stocking on because of hip problems. Her ABI in this clinic was not repeated but the last time she was here it was 1.13 she was not felt to have an arterial issue 12/9; the patient's wound on the left medial lower leg is again closed. She has chronic hemosiderin and stasis dermatitis left greater than right lipodermatosclerosis left greater than right. On the right leg today we are not wrapping she says she hit this on a screen door however there is no open area here. The right leg has a lot of swelling 12/21; this is a patient we discharged 10 days ago. She had bilateral juxta lite stockings and was supposed to be getting compression pumps from her son. According the patient her son tested positive for Covid and  has been on quarantine and she is unable to get the internal part of the juxta lites on properly. She therefore has weeping edema left greater than right medial and is back in clinic. 12/28. Patient comes in with no open areas on the right leg. She still has very inflamed tenderness on the left medial ankle and lower calf. This could be cellulitis or stasis dermatitis. Also similar changes on the left lateral 1/6; patient returns to clinic. We did not wrap her right leg last time we wrapped the left. I gave her a  course of antibiotics because of tenderness on the left medial ankle possible cellulitis versus severe stasis dermatitis. Everything is taken care of on the left where the 3 layer compression was. Unfortunately on the right she has inflamed edema on the right medial ankle and right lateral ankle. Poorly controlled edema. She Kaven with her juxta lite stocking on but it is not wrapped tightly enough to control the swelling. 1/13; she comes back in today after a week of compression wraps on all her wounds are healed. Her skin is dry and flaky she has stasis dermatitis of predominantly in the right medial greater than left medial lower leg extending into her foot on the right. She has a bag of stockings and related apparel including juxta lite stockings, over the toe compression stockings, a sock Donner. She is for 1 reason or another not able to get these things on at home on her own she comes in today with her daughter from [place]. She got external compression pumps delivered the other day but has not started to use them. The other major problem is she lives alone 12/23/2020 upon evaluation today patient unfortunately presents for readmission due to issues that she is having with weeping of her lower extremities along with increased discomfort and pain. She also has increased redness. I am concerned about infection here among other things. She has not been using the compression as she tells me that this is not easy for her to do. She is also not been using her lymphedema pumps enough to even know "whether they would work or not" 2/3; patient came into the clinic last week with concern of cellulitis increasing edema with weeping. She was put on doxycycline she comes in today with dry skin once again but no wounds and no evidence of infection. She probably had stasis dermatitis. She is here with her daughter. As usual we will long discussion about how were going to arrange to have the patient put  stockings on. Since she was last here she had a fall is apparently fractured L3 in the lower thoracic vertebrae. She has some discomfort. Certainly in this situation she could not be expected to put on compression stockings. She has not use the compression pumps we ordered either. 2/17; we discharge the patient 2 weeks ago with bilateral juxta lite stockings. We also suggested in-home care in the morning to help her get the stockings on with the options of being perhaps assisted living or going to live in [place] with her daughter however she is done none of the above. She called the clinic multiple times to report weeping edema. She states today than on Monday, 3 days ago her sheets were soaked with fluid when she woke up in the morning. 2/24; patient once again today looks fine. There is no swelling no weeping edema and no wounds. She brought in her stockings. 01/28/2021 upon evaluation today patient's wounds actually are showing signs  of being completely healed I do not see anything open she does have a lot of dry skin at this point unfortunately. Fortunately there is no signs of active infection however and in general I feel like she is doing quite well. No fevers, chills, nausea, vomiting, or diarrhea. 02/16/2021; patient was discharged almost 3 weeks ago. She is back in clinic today again with weeping areas on the left lateral lower leg and on the right lower leg. Severe stasis dermatitis poorly controlled edema. She apparently has been more compliant with her juxta lites although I did not actually see these on and I am not certain that they were on with enough pressure. She does not complain of pain just the leaking fluid out of the areas 3/29; arrives in clinic today everything is closed. Her edema control is actually quite good. She has been in compression wraps however. She has marked chronic stasis dermatitis skin changes on the right dry flaking skin. 4/11; 2 weeks the patient arrives in  clinic with severe edema in her right leg stasis dermatitis loss of surface epithelium almost circumferentially in the right lower leg. The left leg has some edema but no open wound area. In discussion it turns out that she does not wear her juxta lite product properly and she does not wear the inner stocking layer. We got her compression pumps but she does not seem to abuse them since they were set up according to her daughter. 4/18; this is a patient who came back to clinic last week. Both legs were put under compression due to leaking edema fluid. The left leg is completely healed today whereas the right leg still has some stasis dermatitis. There is no open wound on either side. They apparently have got her do agreed to help to come in to the home and help her with the stockings. 4/26; there are no open wounds to the legs bilaterally. Patient has her compression stockings. She also reports having her lymphedema pumps at home. She is also getting an aide that will help her with her stockings. She has no complaints today. Electronic Signature(s) Signed: 03/23/2021 6:00:49 PM By: Kalman Shan DO Entered By: Kalman Shan on 03/23/2021 17:57:06 -------------------------------------------------------------------------------- Physical Exam Details Patient Name: Date of Service: Jackie [name], [place]. 03/23/2021 3:00 PM Medical Record Number: [id number] Patient Account Number: [id number] Date of Birth/Sex: Treating RN: 08/28/1932 (87 y.o. Jackie [name] Primary Care Provider: [name] Other Clinician: Referring Provider: Treating Provider/Extender: [name] in Treatment: 20 Constitutional respirations regular, non-labored and within target range for patient.. Notes Lower extremities bilaterally: Dry flaky skin noted to both extremities. No open wounds. Electronic Signature(s) Signed: 03/23/2021 6:00:49 PM By: Kalman Shan DO Entered By: Kalman Shan on 03/23/2021 17:57:50 -------------------------------------------------------------------------------- Physician Orders Details Patient Name: Date of Service: Jackie [name], [place]. 03/23/2021 3:00 PM Medical Record Number: [id number] Patient Account Number: [id number] Date of Birth/Sex: Treating RN: August 30, 1932 (87 y.o. Jackie [name] Primary Care Provider: [name] Other Clinician: Referring Provider: Treating Provider/Extender: [name] in Treatment: 20 Verbal / Phone Orders: No Diagnosis Coding ICD-10 Coding Code Description [phone] Chronic venous hypertension (idiopathic) with ulcer and inflammation of left lower extremity I89.0 Lymphedema, not elsewhere classified L97.818 Non-pressure chronic ulcer of other part of right lower leg with other specified severity Discharge From [facility] Discharge from [place] [facility]ing/ Shower/ Hygiene May shower and wash wound with soap and water. Edema Control -  Lymphedema / SCD / Other Lymphedema Pumps. Use Lymphedema pumps on leg(s) 2-3 times a day for 45-60 minutes. If wearing any wraps or hose, do not remove them. Continue exercising as instructed. - Ok to use pumps over compression wraps Elevate legs to the level of the heart or above for 30 minutes daily and/or when sitting, a frequency of: - multiple times throughout the day Avoid standing for long periods of time. Exercise regularly Moisturize legs daily. - nightly after removing compression garment Compression stocking or Garment 20-30 mm/Hg pressure to: - juxtalites or stockings daily Electronic Signature(s) Signed: 03/23/2021 6:00:49 PM By: Kalman Shan DO Entered By: Kalman Shan on 03/23/2021 17:58:00 -------------------------------------------------------------------------------- Problem List Details Patient Name: Date of Service: Jackie [name], [place]. 03/23/2021 3:00 PM Medical Record Number: [id number] Patient  Account Number: [id number] Date of Birth/Sex: Treating RN: 08/01/1932 (87 y.o. Jackie [name] Primary Care Provider: [name] Other Clinician: Referring Provider: Treating Provider/Extender: [name] in Treatment: 20 Active Problems ICD-10 Encounter Code Description Active Date MDM Diagnosis I87.332 Chronic venous hypertension (idiopathic) with ulcer and inflammation of left 10/29/2020 No Yes lower extremity I89.0 Lymphedema, not elsewhere classified 10/29/2020 No Yes L97.818 Non-pressure chronic ulcer of other part of right lower leg with other specified 03/08/2021 No Yes severity Inactive Problems ICD-10 Code Description Active Date Inactive Date L97.321 Non-pressure chronic ulcer of left ankle limited to breakdown of skin 10/29/2020 10/29/2020 Resolved Problems Electronic Signature(s) Signed: 03/23/2021 6:00:49 PM By: Kalman Shan DO Entered By: Kalman Shan on 03/23/2021 17:55:07 -------------------------------------------------------------------------------- Progress Note Details Patient Name: Date of Service: Jackie [name], [place]. 03/23/2021 3:00 PM Medical Record Number: [id number] Patient Account Number: [id number] Date of Birth/Sex: Treating RN: Jan 20, 1932 (87 y.o. Jackie [name] Primary Care Provider: [name] Other Clinician: Referring Provider: Treating Provider/Extender: [name] in Treatment: 20 Subjective Chief Complaint Information obtained from Patient 07/17/2020; patient is here for review of a wound on her left medial ankle and lower leg History of Present Illness (HPI) ADMISSION 07/17/2020 This is an 87 year old woman who is referred by her nurse practitioner at [facility] who has been doing treatment for the wound on the right medial lower leg and ankle. Apparently 1 point a substantial wound however it is largely epithelialized now. Patient states its been there  for about 5 or 6 weeks. On the background she has changes in the skin in this area I think the go back several years to when she had a left hip fracture. Nevertheless she does not have a wound history that I could elicit. As far as I know that they had been using TCA, Mepitel under an Unna boot they have been changing this weekly at [facility]. She also received a course of doxycycline. Past medical history very little that I can find in [place] link. She has a history of venous insufficiency bilaterally and a left hip fracture hypertension and a history of thyroidectomy for thyroid cancer. ABI in our clinic was 1.13 on the left 8/27; the patient is substantial initial wound by her description on the left medial lower leg and ankle is completely epithelialized. She has chronic venous insufficiency, dermatosclerosis and probably lymphedema. She does not have stockings and states that she would have trouble getting over the toe stockings on in any fashion because of hip problems except 9/3; this is a patient with chronic venous insufficiency with a particularly difficult area on her left medial lower  leg and ankle. My assessment of this is that she has chronic venous insufficiency and chronic venous hypertension. And at least on the left medial ankle stasis dermatitis. The area still looks angry and inflamed. Nevertheless it is epithelialized. I do not believe there is active infection. We have ordered her bilateral juxta lite stockings. These should be easier for her to put on but need to be adjusted to 30/40 compression. If this is not successful in maintaining this area I think she is going to need formal reflux studies to see if she might be a candidate for ablation of the greater saphenous vein 08/05/2020 upon evaluation today patient appears to be doing poorly in regard to her lower extremity. Unfortunately this has reopened after being healed on Friday. She tells me she was  wearing her compression wrap but that even when she woke up in the morning on Saturday morning she was leaking. Fortunately there is no signs of active infection at this time systemically though there is some question of a thing locally. She does have juxta light compression bilaterally. 08/12/2020 on evaluation today patient appears to be doing well with regard to her leg ulcer which actually is closed again at this point. With that being said she is still having some issues currently with very thin skin that has come back over and again with her swelling if we do not wrap around afraid this is can open right back up. Nonetheless I do believe that she should proceed with the vascular testing next Thursday as previously ordered and recommended. Fortunately there is no signs of active infection at this time. 9/23; this is a patient I discharged a few weeks ago. Apparently she had a reopening on the left leg medially. This is closed now. We have ordered venous reflux studies but they are not until 10/5 at [place] 10/29/2020 Patient is a now 87 year old female. She is somewhat frail however still lives at home largely on her own. We had her in the clinic in August and September 2021 with a wound on the left medial lower extremity in the setting of chronic venous insufficiency and lymphedema. This closed reasonably easily and we discharged her to juxta lite stockings. After her discharge she had her venous reflux studies at interventional radiology. She was seen in consultation by Dr. Earleen Newport. She did not have evidence of thrombosis. She did have significant reflux of the bilateral greater saphenous vein on the right there was reflux from the saphenofemoral junction to the proximal calf where there is further decompression through network of varicosities on the left there was reflux from the saphenofemoral junction to the distal calf there is also a varicose network decompressing the  calf. She was recommended I think for venous ablations of the greater saphenous vein on the left however she is wanting to defer that it into the new year. She tells me that very shortly after she left here the last time she was not able to put the juxta lite stocking on largely the inner sleeve. She developed swelling and then leakage and skin breakdown again in the left medial lower extremity. She was seen by podiatry on 3 occasions I think they were applying Unna boots. Her son says that at one point the [facility] boot was not put on properly his wife who is a nurse had to adjust this and the swelling is actually somewhat better. She arrives in clinic today with very significant swelling of the proximal two thirds of her  lower extremity erythema superficial skin breakdown on the left medial ankle and calf and weeping edema fluid. Past medical history not much change from last time she has chronic venous insufficiency with very significant reflux and lymphedema. She states that she has trouble bending over to get the inner part of the juxta lite stocking on because of hip problems. Her ABI in this clinic was not repeated but the last time she was here it was 1.13 she was not felt to have an arterial issue 12/9; the patient's wound on the left medial lower leg is again closed. She has chronic hemosiderin and stasis dermatitis left greater than right lipodermatosclerosis left greater than right. On the right leg today we are not wrapping she says she hit this on a screen door however there is no open area here. The right leg has a lot of swelling 12/21; this is a patient we discharged 10 days ago. She had bilateral juxta lite stockings and was supposed to be getting compression pumps from her son. According the patient her son tested positive for Covid and has been on quarantine and she is unable to get the internal part of the juxta lites on properly. She therefore has weeping edema left greater than  right medial and is back in clinic. 12/28. Patient comes in with no open areas on the right leg. She still has very inflamed tenderness on the left medial ankle and lower calf. This could be cellulitis or stasis dermatitis. Also similar changes on the left lateral 1/6; patient returns to clinic. We did not wrap her right leg last time we wrapped the left. I gave her a course of antibiotics because of tenderness on the left medial ankle possible cellulitis versus severe stasis dermatitis. Everything is taken care of on the left where the 3 layer compression was. Unfortunately on the right she has inflamed edema on the right medial ankle and right lateral ankle. Poorly controlled edema. She [place] with her juxta lite stocking on but it is not wrapped tightly enough to control the swelling. 1/13; she comes back in today after a week of compression wraps on all her wounds are healed. Her skin is dry and flaky she has stasis dermatitis of predominantly in the right medial greater than left medial lower leg extending into her foot on the right. She has a bag of stockings and related apparel including juxta lite stockings, over the toe compression stockings, a sock Donner. She is for 1 reason or another not able to get these things on at home on her own she comes in today with her daughter from [place]. She got external compression pumps delivered the other day but has not started to use them. The other major problem is she lives alone 12/23/2020 upon evaluation today patient unfortunately presents for readmission due to issues that she is having with weeping of her lower extremities along with increased discomfort and pain. She also has increased redness. I am concerned about infection here among other things. She has not been using the compression as she tells me that this is not easy for her to do. She is also not been using her lymphedema pumps enough to even know "whether they would work or not" 2/3;  patient came into the clinic last week with concern of cellulitis increasing edema with weeping. She was put on doxycycline she comes in today with dry skin once again but no wounds and no evidence of infection. She probably had stasis dermatitis. She is  here with her daughter. As usual we will long discussion about how were going to arrange to have the patient put stockings on. Since she was last here she had a fall is apparently fractured L3 in the lower thoracic vertebrae. She has some discomfort. Certainly in this situation she could not be expected to put on compression stockings. She has not use the compression pumps we ordered either. 2/17; we discharge the patient 2 weeks ago with bilateral juxta lite stockings. We also suggested in-home care in the morning to help her get the stockings on with the options of being perhaps assisted living or going to live in [place] with her daughter however she is done none of the above. She called the clinic multiple times to report weeping edema. She states today than on Monday, 3 days ago her sheets were soaked with fluid when she woke up in the morning. 2/24; patient once again today looks fine. There is no swelling no weeping edema and no wounds. She brought in her stockings. 01/28/2021 upon evaluation today patient's wounds actually are showing signs of being completely healed I do not see anything open she does have a lot of dry skin at this point unfortunately. Fortunately there is no signs of active infection however and in general I feel like she is doing quite well. No fevers, chills, nausea, vomiting, or diarrhea. 02/16/2021; patient was discharged almost 3 weeks ago. She is back in clinic today again with weeping areas on the left lateral lower leg and on the right lower leg. Severe stasis dermatitis poorly controlled edema. She apparently has been more compliant with her juxta lites although I did not actually see these on and I am not certain  that they were on with enough pressure. She does not complain of pain just the leaking fluid out of the areas 3/29; arrives in clinic today everything is closed. Her edema control is actually quite good. She has been in compression wraps however. She has marked chronic stasis dermatitis skin changes on the right dry flaking skin. 4/11; 2 weeks the patient arrives in clinic with severe edema in her right leg stasis dermatitis loss of surface epithelium almost circumferentially in the right lower leg. The left leg has some edema but no open wound area. In discussion it turns out that she does not wear her juxta lite product properly and she does not wear the inner stocking layer. We got her compression pumps but she does not seem to abuse them since they were set up according to her daughter. 4/18; this is a patient who came back to clinic last week. Both legs were put under compression due to leaking edema fluid. The left leg is completely healed today whereas the right leg still has some stasis dermatitis. There is no open wound on either side. They apparently have got her do agreed to help to come in to the home and help her with the stockings. 4/26; there are no open wounds to the legs bilaterally. Patient has her compression stockings. She also reports having her lymphedema pumps at home. She is also getting an aide that will help her with her stockings. She has no complaints today. Patient History Information obtained from Patient. Family History Cancer - Siblings, Diabetes - Siblings, Heart Disease - Father, Hypertension - Mother,Father, Lung Disease - Siblings, Stroke - Mother, No family history of Hereditary Spherocytosis, Kidney Disease, Seizures, Thyroid Problems, Tuberculosis. Social History Never smoker, Marital Status - Widowed, Alcohol Use - Never, Drug  Use - No History, Caffeine Use - Rarely. Medical History Eyes Denies history of Cataracts, Glaucoma, Optic  Neuritis Ear/Nose/Mouth/Throat Denies history of Chronic sinus problems/congestion, Middle ear problems Hematologic/Lymphatic Denies history of Anemia, Hemophilia, Human Immunodeficiency Virus, Lymphedema, Sickle Cell Disease Respiratory Denies history of Aspiration, Asthma, Chronic Obstructive Pulmonary Disease (COPD), Pneumothorax, Sleep Apnea, Tuberculosis Cardiovascular Patient has history of Hypertension Denies history of Angina, Arrhythmia, Congestive Heart Failure, Coronary Artery Disease, Deep Vein Thrombosis, Hypotension, Myocardial Infarction, Peripheral Arterial Disease, Peripheral Venous Disease, Phlebitis, Vasculitis Gastrointestinal Denies history of Cirrhosis , Colitis, Crohnoos, Hepatitis A, Hepatitis B, Hepatitis C Endocrine Denies history of Type I Diabetes, Type II Diabetes Genitourinary Denies history of End Stage Renal Disease Immunological Denies history of Lupus Erythematosus, Raynaudoos, Scleroderma Integumentary (Skin) Denies history of History of Burn Musculoskeletal Denies history of Gout, Rheumatoid Arthritis, Osteoarthritis, Osteomyelitis Neurologic Denies history of Dementia, Neuropathy, Quadriplegia, Paraplegia, Seizure Disorder Oncologic Denies history of Received Chemotherapy, Received Radiation Psychiatric Denies history of Anorexia/bulimia, Confinement Anxiety Objective Constitutional respirations regular, non-labored and within target range for patient.. Vitals Time Taken: 4:11 PM, Height: 61 in, Weight: 126 lbs, BMI: 23.8, Temperature: 98.2 F, Pulse: 75 bpm, Respiratory Rate: 18 breaths/min, Blood Pressure: 132/81 mmHg. General Notes: Lower extremities bilaterally: Dry flaky skin noted to both extremities. No open wounds. Integumentary (Hair, Skin) Wound #5 status is Open. Original cause of wound was Gradually Appeared. The date acquired was: 03/08/2021. The wound has been in treatment 2 weeks. The wound is located on the  Right,Circumferential Lower Leg. The wound measures 0cm length x 0cm width x 0cm depth; 0cm^2 area and 0cm^3 volume. Assessment Active Problems ICD-10 Chronic venous hypertension (idiopathic) with ulcer and inflammation of left lower extremity Lymphedema, not elsewhere classified Non-pressure chronic ulcer of other part of right lower leg with other specified severity Patient presents with no open wounds. She has compression stockings today we will place these in the office. She is set up at home with her lymphatic pumps and an aide to help her. She can follow-up as needed. Plan Discharge From [facility] Services: Discharge from [facility]/ Shower/ Hygiene: May shower and wash wound with soap and water. Edema Control - Lymphedema / SCD / Other: Lymphedema Pumps. Use Lymphedema pumps on leg(s) 2-3 times a day for 45-60 minutes. If wearing any wraps or hose, do not remove them. Continue exercising as instructed. - Ok to use pumps over compression wraps Elevate legs to the level of the heart or above for 30 minutes daily and/or when sitting, a frequency of: - multiple times throughout the day Avoid standing for long periods of time. Exercise regularly Moisturize legs daily. - nightly after removing compression garment Compression stocking or Garment 20-30 mm/Hg pressure to: - juxtalites or stockings daily 1. Compression stockings daily 2. Lymphedema pumps daily 3. Follow-up as needed Electronic Signature(s) Signed: 03/23/2021 6:00:49 PM By: Kalman Shan DO Entered By: Kalman Shan on 03/23/2021 17:59:17 -------------------------------------------------------------------------------- HxROS Details Patient Name: Date of Service: Jackie [name], [place]. 03/23/2021 3:00 PM Medical Record Number: [id number] Patient Account Number: [id number] Date of Birth/Sex: Treating RN: 01/12/1932 (87 y.o. Jackie [name] Primary Care Provider: [name] Other Clinician: Referring  Provider: Treating Provider/Extender: [name] in Treatment: 20 Information Obtained From Patient Eyes Medical History: Negative for: Cataracts; Glaucoma; Optic Neuritis Ear/Nose/Mouth/Throat Medical History: Negative for: Chronic sinus problems/congestion; Middle ear problems Hematologic/Lymphatic Medical History: Negative for: Anemia; Hemophilia; Human Immunodeficiency Virus; Lymphedema; Sickle Cell Disease Respiratory Medical History: Negative  for: Aspiration; Asthma; Chronic Obstructive Pulmonary Disease (COPD); Pneumothorax; Sleep Apnea; Tuberculosis Cardiovascular Medical History: Positive for: Hypertension Negative for: Angina; Arrhythmia; Congestive Heart Failure; Coronary Artery Disease; Deep Vein Thrombosis; Hypotension; Myocardial Infarction; Peripheral Arterial Disease; Peripheral Venous Disease; Phlebitis; Vasculitis Gastrointestinal Medical History: Negative for: Cirrhosis ; Colitis; Crohns; Hepatitis A; Hepatitis B; Hepatitis C Endocrine Medical History: Negative for: Type I Diabetes; Type II Diabetes Genitourinary Medical History: Negative for: End Stage Renal Disease Immunological Medical History: Negative for: Lupus Erythematosus; Raynauds; Scleroderma Integumentary (Skin) Medical History: Negative for: History of Burn Musculoskeletal Medical History: Negative for: Gout; Rheumatoid Arthritis; Osteoarthritis; Osteomyelitis Neurologic Medical History: Negative for: Dementia; Neuropathy; Quadriplegia; Paraplegia; Seizure Disorder Oncologic Medical History: Negative for: Received Chemotherapy; Received Radiation Psychiatric Medical History: Negative for: Anorexia/bulimia; Confinement Anxiety Immunizations Pneumococcal Vaccine: Received Pneumococcal Vaccination: No Implantable Devices None Family and Social History Cancer: Yes - Siblings; Diabetes: Yes - Siblings; Heart Disease: Yes - Father; Hereditary Spherocytosis:  No; Hypertension: Yes - Mother,Father; Kidney Disease: No; Lung Disease: Yes - Siblings; Seizures: No; Stroke: Yes - Mother; Thyroid Problems: No; Tuberculosis: No; Never smoker; Marital Status - Widowed; Alcohol Use: Never; Drug Use: No History; Caffeine Use: Rarely; Financial Concerns: No; Food, Clothing or Shelter Needs: No; Support System Lacking: No; Transportation Concerns: No Electronic Signature(s) Signed: 03/23/2021 6:00:49 PM By: Kalman Shan DO Signed: 03/23/2021 6:28:10 PM By: Baruch Gouty RN, BSN Entered By: Kalman Shan on 03/23/2021 17:57:13 -------------------------------------------------------------------------------- [place] Details Patient Name: Date of Service: Jackie [name], Jackie CE M. 03/23/2021 Medical Record Number: [id number] Patient Account Number: [id number] Date of Birth/Sex: Treating RN: [birth year] (87 y.o. Jackie [name] Primary Care Provider: [name] Other Clinician: Referring Provider: Treating Provider/Extender: [name] in Treatment: 20 Diagnosis Coding ICD-10 Codes Code Description [phone] Chronic venous hypertension (idiopathic) with ulcer and inflammation of left lower extremity I89.0 Lymphedema, not elsewhere classified L97.818 Non-pressure chronic ulcer of other part of right lower leg with other specified severity Facility Procedures CPT4 Code: [id number] Description: [id number] - WOUND CARE VISIT-LEV 3 EST PT Modifier: Quantity: 1 Electronic Signature(s) Signed: 03/23/2021 6:00:49 PM By: Kalman Shan DO Entered By: Kalman Shan on 03/23/2021 18:00:08

## 2021-03-23 NOTE — Progress Notes (Signed)
Jackie, [name] [place]. ([id number]) Visit Report for 03/23/2021 Arrival Information Details Patient Name: Date of Service: Jackie [name] CE M. 03/23/2021 3:00 PM Medical Record Number: [id number] Patient Account Number: [id number] Date of Birth/Sex: Treating RN: [birth year] (87 y.o. [name] Primary Care Shadman Tozzi: [name] Other Clinician: Referring Deb Loudin: Treating Taegen Delker/Extender: [name] in Treatment: 49 Visit Information History Since Last Visit Added or deleted any medications: No Patient Arrived: Jackie [name] Any new allergies or adverse reactions: No Arrival Time: 16:10 Had a fall or experienced change in No Accompanied By: daughter activities of daily living that may affect Transfer Assistance: None risk of falls: Patient Identification Verified: Yes Signs or symptoms of abuse/neglect since last visito No Secondary Verification Process Completed: Yes Hospitalized since last visit: No Patient Requires Transmission-Based Precautions: No Implantable device outside of the clinic excluding No Patient Has Alerts: Yes cellular tissue based products placed in the center Patient Alerts: ABI: L 1.13 07/2020 since last visit: Has Dressing in Place as Prescribed: Yes Has Compression in Place as Prescribed: Yes Pain Present Now: No Electronic Signature(s) Signed: 03/23/2021 5:25:58 PM By: Lorrin Jackson Entered By: Lorrin Jackson on 03/23/2021 16:11:45 -------------------------------------------------------------------------------- Clinic Level of Care Assessment Details Patient Name: Date of Service: Jackie [name], [place]. 03/23/2021 3:00 PM Medical Record Number: [id number] Patient Account Number: [id number] Date of Birth/Sex: Treating RN: 1932-04-13 (87 y.o. Jackie [name] Primary Care Devyn Sheerin: [name] Other Clinician: Referring Chinwe Lope: Treating Niccolas Loeper/Extender: [name] in Treatment: 20 Clinic Level  of Care Assessment Items TOOL 4 Quantity Score []  - 0 Use when only an EandM is performed on FOLLOW-UP visit ASSESSMENTS - Nursing Assessment / Reassessment X- 1 10 Reassessment of Co-morbidities (includes updates in patient status) X- 1 5 Reassessment of Adherence to Treatment Plan ASSESSMENTS - Wound and Skin A ssessment / Reassessment []  - 0 Simple Wound Assessment / Reassessment - one wound []  - 0 Complex Wound Assessment / Reassessment - multiple wounds X- 1 10 Dermatologic / Skin Assessment (not related to wound area) ASSESSMENTS - Focused Assessment X- 2 5 Circumferential Edema Measurements - multi extremities []  - 0 Nutritional Assessment / Counseling / Intervention X- 1 5 Lower Extremity Assessment (monofilament, tuning fork, pulses) []  - 0 Peripheral Arterial Disease Assessment (using hand held doppler) ASSESSMENTS - Ostomy and/or Continence Assessment and Care []  - 0 Incontinence Assessment and Management []  - 0 Ostomy Care Assessment and Management (repouching, etc.) PROCESS - Coordination of Care X - Simple Patient / Family Education for ongoing care 1 15 []  - 0 Complex (extensive) Patient / Family Education for ongoing care X- 1 10 Staff obtains Programmer, systems, Records, T Results / Process Orders est []  - 0 Staff telephones HHA, Nursing Homes / Clarify orders / etc []  - 0 Routine Transfer to another Facility (non-emergent condition) []  - 0 Routine Hospital Admission (non-emergent condition) []  - 0 New Admissions / Biomedical engineer / Ordering NPWT Apligraf, etc. , []  - 0 Emergency Hospital Admission (emergent condition) X- 1 10 Simple Discharge Coordination []  - 0 Complex (extensive) Discharge Coordination PROCESS - Special Needs []  - 0 Pediatric / Minor Patient Management []  - 0 Isolation Patient Management []  - 0 Hearing / Language / Visual special needs []  - 0 Assessment of Community assistance (transportation, D/C planning, etc.) []  -  0 Additional assistance / Altered mentation []  - 0 Support Surface(s) Assessment (bed, cushion, seat, etc.) INTERVENTIONS - Wound Cleansing / Measurement []  - 0 Simple Wound  Cleansing - one wound []  - 0 Complex Wound Cleansing - multiple wounds []  - 0 Wound Imaging (photographs - any number of wounds) []  - 0 Wound Tracing (instead of photographs) []  - 0 Simple Wound Measurement - one wound []  - 0 Complex Wound Measurement - multiple wounds INTERVENTIONS - Wound Dressings []  - 0 Small Wound Dressing one or multiple wounds []  - 0 Medium Wound Dressing one or multiple wounds []  - 0 Large Wound Dressing one or multiple wounds []  - 0 Application of Medications - topical []  - 0 Application of Medications - injection INTERVENTIONS - Miscellaneous []  - 0 External ear exam []  - 0 Specimen Collection (cultures, biopsies, blood, body fluids, etc.) []  - 0 Specimen(s) / Culture(s) sent or taken to Lab for analysis []  - 0 Patient Transfer (multiple staff / Civil Service fast streamer / Similar devices) []  - 0 Simple Staple / Suture removal (25 or less) []  - 0 Complex Staple / Suture removal (26 or more) []  - 0 Hypo / Hyperglycemic Management (close monitor of Blood Glucose) []  - 0 Ankle / Brachial Index (ABI) - do not check if billed separately X- 1 5 Vital Signs Has the patient been seen at the hospital within the last three years: Yes Total Score: 80 Level Of Care: New/Established - Level 3 Electronic Signature(s) Signed: 03/23/2021 6:28:10 PM By: Baruch Gouty RN, BSN Entered By: Baruch Gouty on 03/23/2021 16:56:59 -------------------------------------------------------------------------------- Encounter Discharge Information Details Patient Name: Date of Service: Jackie [name], [place] M. 03/23/2021 3:00 PM Medical Record Number: [id number] Patient Account Number: [id number] Date of Birth/Sex: Treating RN: Jan 19, 1932 (87 y.o. Jackie [name] Primary Care Kosha Jaquith: [name] Other  Clinician: Referring Jackie [name]: Treating Wilna Pennie/Extender: [name] in Treatment: 20 Encounter Discharge Information Items Discharge Condition: Stable Ambulatory Status: Cane Discharge Destination: Home Transportation: Private Auto Accompanied By: Charolett Bumpers Schedule Follow-up Appointment: Yes Clinical Summary of Care: Patient Declined Electronic Signature(s) Signed: 03/23/2021 6:28:10 PM By: Baruch Gouty RN, BSN Entered By: Baruch Gouty on 03/23/2021 17:12:10 -------------------------------------------------------------------------------- Lower Extremity Assessment Details Patient Name: Date of Service: Jackie [name], [place]. 03/23/2021 3:00 PM Medical Record Number: [id number] Patient Account Number: [id number] Date of Birth/Sex: Treating RN: [birth year] (87 y.o. [name] Primary Care Zayne Marovich: [name] Other Clinician: Referring Lucca Greggs: Treating Ventura Leggitt/Extender: [name] in Treatment: 20 Edema Assessment Assessed: [name]: Yes] [Right: Yes] Edema: [Left: Yes] [Right: Yes] Calf Left: Right: Point of Measurement: 38 cm From Medial Instep 30.6 cm 31 cm Ankle Left: Right: Point of Measurement: 9 cm From Medial Instep 19.8 cm 20 cm Vascular Assessment Pulses: Dorsalis Pedis Palpable: [Left:Yes] [Right:Yes] Electronic Signature(s) Signed: 03/23/2021 5:25:58 PM By: Lorrin Jackson Entered By: Lorrin Jackson on 03/23/2021 16:20:01 -------------------------------------------------------------------------------- Multi Wound Chart Details Patient Name: Date of Service: Jackie [name], [place]. 03/23/2021 3:00 PM Medical Record Number: [id number] Patient Account Number: [id number] Date of Birth/Sex: Treating RN: [birth year] (87 y.o. Jackie [name] Primary Care Gorden Stthomas: [name] Other Clinician: Referring Ruel Dimmick: Treating Italo Banton/Extender: [name] in Treatment: 20 Vital  Signs Height(in): 61 Pulse(bpm): 75 Weight(lbs): 126 Blood Pressure(mmHg): 132/81 Body Mass Index(BMI): 24 Temperature(F): 98.2 Respiratory Rate(breaths/min): 18 Photos: [5:No Photos Right, Circumferential Lower Leg] [N/A:N/A N/A] Wound Location: [5:Gradually Appeared] [N/A:N/A] Wounding Event: [5:Lymphedema] [N/A:N/A] Primary Etiology: [5:Hypertension] [N/A:N/A] Comorbid History: [5:03/08/2021] [N/A:N/A] Date Acquired: [5:2] [N/A:N/A] Weeks of Treatment: [5:Open] [N/A:N/A] Wound Status: [5:0x0x0] [N/A:N/A] Measurements L x W x D (cm) [5:0] [N/A:N/A] A (cm) : rea [  5:0] [N/A:N/A] Volume (cm) : [5:100.00%] [N/A:N/A] % Reduction in A rea: [5:100.00%] [N/A:N/A] % Reduction in Volume: [5:Full Thickness Without Exposed] [N/A:N/A] Classification: [5:Support Structures] Treatment Notes Wound #5 (Lower Leg) Wound Laterality: Right, Circumferential Cleanser Peri-Wound Care Topical Primary Dressing Secondary Dressing Secured With Compression Wrap Compression Stockings Add-Ons Notes bil juxtalites applied Electronic Signature(s) Signed: 03/23/2021 6:00:49 PM By: Kalman Shan DO Signed: 03/23/2021 6:28:10 PM By: Baruch Gouty RN, BSN Entered By: Kalman Shan on 03/23/2021 17:55:13 -------------------------------------------------------------------------------- Multi-Disciplinary Care Plan Details Patient Name: Date of Service: Jackie [name], [place]. 03/23/2021 3:00 PM Medical Record Number: [id number] Patient Account Number: [id number] Date of Birth/Sex: Treating RN: March 26, 1932 (87 y.o. Jackie [name] Primary Care Burna Atlas: [name] Other Clinician: Referring Seona Clemenson: Treating Clydell Alberts/Extender: [name] in Treatment: [place] reviewed with physician Active Inactive Electronic Signature(s) Signed: 03/23/2021 6:28:10 PM By: Baruch Gouty RN, BSN Entered By: Baruch Gouty on 03/23/2021  16:56:07 -------------------------------------------------------------------------------- Pain Assessment Details Patient Name: Date of Service: Jackie [name], [place]. 03/23/2021 3:00 PM Medical Record Number: [id number] Patient Account Number: [id number] Date of Birth/Sex: Treating RN: Mar 15, 1932 (87 y.o. [name] Primary Care Raad Clayson: [name] Other Clinician: Referring Toba Claudio: Treating Kemuel Buchmann/Extender: [name] in Treatment: 20 Active Problems Location of Pain Severity and Description of Pain Patient Has Paino No Site Locations Pain Management and Medication Current Pain Management: Electronic Signature(s) Signed: 03/23/2021 5:25:58 PM By: Lorrin Jackson Entered By: Lorrin Jackson on 03/23/2021 16:16:11 -------------------------------------------------------------------------------- Patient/Caregiver Education Details Patient Name: Date of Service: Jackie [name], [place]. 4/26/2022andnbsp3:00 PM Medical Record Number: [id number] Patient Account Number: [id number] Date of Birth/Gender: Treating RN: 1932-01-19 (87 y.o. Jackie [name] Primary Care Physician: [name] Other Clinician: Referring Physician: Treating Physician/Extender: [name] in Treatment: 20 Education Assessment Education Provided To: Patient Education Topics Provided Venous: Methods: Explain/Verbal Responses: Reinforcements needed, State content correctly Wound/Skin Impairment: Methods: Explain/Verbal Responses: Reinforcements needed, State content correctly Electronic Signature(s) Signed: 03/23/2021 6:28:10 PM By: Baruch Gouty RN, BSN Entered By: Baruch Gouty on 03/23/2021 16:56:28 -------------------------------------------------------------------------------- Wound Assessment Details Patient Name: Date of Service: Jackie [name], [place]. 03/23/2021 3:00 PM Medical Record Number: [id number] Patient Account Number:  [id number] Date of Birth/Sex: Treating RN: 1932-05-30 (87 y.o. [name] Primary Care Keala Drum: [name] Other Clinician: Referring Valorie Mcgrory: Treating Dillan Lunden/Extender: [name] in Treatment: 20 Wound Status Wound Number: 5 Primary Etiology: Lymphedema Wound Location: Right, Circumferential Lower Leg Wound Status: Open Wounding Event: Gradually Appeared Comorbid History: Hypertension Date Acquired: 03/08/2021 Weeks Of Treatment: 2 Clustered Wound: No Wound Measurements Length: (cm) Width: (cm) Depth: (cm) Area: (cm) Volume: (cm) Wound Description Classification: Full Thickness Without Exposed Support Structur es 0 % Reduction in Area: 100% 0 % Reduction in Volume: 100% 0 0 0 Electronic Signature(s) Signed: 03/23/2021 5:25:58 PM By: Lorrin Jackson Entered By: Lorrin Jackson on 03/23/2021 16:22:04 -------------------------------------------------------------------------------- Vitals Details Patient Name: Date of Service: Jackie [name], [place]. 03/23/2021 3:00 PM Medical Record Number: [id number] Patient Account Number: [id number] Date of Birth/Sex: Treating RN: 1932-08-31 (87 y.o. [name] Primary Care Coree Riester: [name] Other Clinician: Referring Braeden Dolinski: Treating Ryu Cerreta/Extender: [name] in Treatment: 20 Vital Signs Time Taken: 16:11 Temperature (F): 98.2 Height (in): 61 Pulse (bpm): 75 Weight (lbs): 126 Respiratory Rate (breaths/min): 18 Body Mass Index (BMI): 23.8 Blood Pressure (mmHg): 132/81 Reference Range: 80 - 120 mg / dl Electronic Signature(s)  Signed: 03/23/2021 5:25:58 PM By: Lorrin Jackson Entered By: Lorrin Jackson on 03/23/2021 16:14:51

## 2021-04-02 ENCOUNTER — Ambulatory Visit: Payer: Medicare Other | Admitting: Podiatry

## 2021-04-21 ENCOUNTER — Encounter (HOSPITAL_BASED_OUTPATIENT_CLINIC_OR_DEPARTMENT_OTHER): Payer: Medicare Other | Admitting: Internal Medicine

## 2021-05-24 ENCOUNTER — Ambulatory Visit: Payer: Medicare Other | Admitting: Podiatry

## 2021-05-27 ENCOUNTER — Other Ambulatory Visit: Payer: Self-pay

## 2021-05-27 ENCOUNTER — Encounter (HOSPITAL_BASED_OUTPATIENT_CLINIC_OR_DEPARTMENT_OTHER): Payer: Medicare Other | Attending: Internal Medicine | Admitting: Internal Medicine

## 2021-05-27 DIAGNOSIS — Z881 Allergy status to other antibiotic agents status: Secondary | ICD-10-CM | POA: Diagnosis not present

## 2021-05-27 DIAGNOSIS — I890 Lymphedema, not elsewhere classified: Secondary | ICD-10-CM | POA: Insufficient documentation

## 2021-05-27 DIAGNOSIS — Z833 Family history of diabetes mellitus: Secondary | ICD-10-CM | POA: Insufficient documentation

## 2021-05-27 DIAGNOSIS — Z8249 Family history of ischemic heart disease and other diseases of the circulatory system: Secondary | ICD-10-CM | POA: Diagnosis not present

## 2021-05-27 DIAGNOSIS — L97909 Non-pressure chronic ulcer of unspecified part of unspecified lower leg with unspecified severity: Secondary | ICD-10-CM | POA: Insufficient documentation

## 2021-05-27 DIAGNOSIS — Z885 Allergy status to narcotic agent status: Secondary | ICD-10-CM | POA: Diagnosis not present

## 2021-05-27 DIAGNOSIS — I872 Venous insufficiency (chronic) (peripheral): Secondary | ICD-10-CM | POA: Insufficient documentation

## 2021-05-27 NOTE — Progress Notes (Signed)
BRIAHNNA, HARRIES Jackie [name]. ([id number]) Visit Report for 05/27/2021 Allergy List Details Patient Name: Date of Service: Jackie [name] [name]. 05/27/2021 9:15 A M Medical Record Number: [id number] Patient Account Number: [id number] Date of Birth/Sex: Treating RN: 1932-06-14 (87 y.o. [name] Primary Care Earley Grobe: [name] Other Clinician: Referring Pearl Berlinger: Treating Tomma Ehinger/Extender: [name] in Treatment: 0 Allergies Active Allergies codeine Zithromax Allergy Notes Electronic Signature(s) Signed: 05/27/2021 6:39:25 PM By: Lorrin Jackson Entered By: Lorrin Jackson on 05/27/2021 09:49:40 -------------------------------------------------------------------------------- Arrival Information Details Patient Name: Date of Service: Jackie [name], Jackie [name]. 05/27/2021 9:15 A M Medical Record Number: [id number] Patient Account Number: [id number] Date of Birth/Sex: Treating RN: 25-Mar-1932 (87 y.o. [name] Primary Care Ajwa Kimberley: [name] Other Clinician: Referring Catalia Massett: Treating Rashad Auld/Extender: [name] in Treatment: 0 Visit Information Patient Arrived: [name] Arrival Time: 09:48 Accompanied By: Pandora Leiter Transfer Assistance: None Patient Identification Verified: Yes Secondary Verification Process Completed: Yes Patient Requires Transmission-Based Precautions: No Patient Has Alerts: Yes History Since Last Visit Added or deleted any medications: No Any new allergies or adverse reactions: No Had a fall or experienced change in activities of daily living that may affect risk of falls: No Signs or symptoms of abuse/neglect since last visito No Hospitalized since last visit: No Implantable device outside of the clinic excluding cellular tissue based products placed in the center since last visit: No Has Dressing in Place as Prescribed: Yes Electronic Signature(s) Signed: 05/27/2021 6:39:25 PM By: Lorrin Jackson Entered  By: Lorrin Jackson on 05/27/2021 09:49:06 -------------------------------------------------------------------------------- Clinic Level of Care Assessment Details Patient Name: Date of Service: Jackie [name] [name]. 05/27/2021 9:15 A M Medical Record Number: [id number] Patient Account Number: [id number] Date of Birth/Sex: Treating RN: 1932/07/18 (87 y.o. [name], [place] Primary Care Evelyne Makepeace: [name] Other Clinician: Referring Colleena Kurtenbach: Treating Donshay Lupinski/Extender: [name] in Treatment: 0 Clinic Level of Care Assessment Items TOOL 4 Quantity Score X- 1 0 Use when only an EandM is performed on FOLLOW-UP visit ASSESSMENTS - Nursing Assessment / Reassessment X- 1 10 Reassessment of Co-morbidities (includes updates in patient status) X- 1 5 Reassessment of Adherence to Treatment Plan ASSESSMENTS - Wound and Skin A ssessment / Reassessment X - Simple Wound Assessment / Reassessment - one wound 1 5 []  - 0 Complex Wound Assessment / Reassessment - multiple wounds X- 1 10 Dermatologic / Skin Assessment (not related to wound area) ASSESSMENTS - Focused Assessment X- 1 5 Circumferential Edema Measurements - multi extremities []  - 0 Nutritional Assessment / Counseling / Intervention []  - 0 Lower Extremity Assessment (monofilament, tuning fork, pulses) []  - 0 Peripheral Arterial Disease Assessment (using hand held doppler) ASSESSMENTS - Ostomy and/or Continence Assessment and Care []  - 0 Incontinence Assessment and Management []  - 0 Ostomy Care Assessment and Management (repouching, etc.) PROCESS - Coordination of Care X - Simple Patient / Family Education for ongoing care 1 15 []  - 0 Complex (extensive) Patient / Family Education for ongoing care []  - 0 Staff obtains Programmer, systems, Records, T Results / Process Orders est []  - 0 Staff telephones HHA, Nursing Homes / Clarify orders / etc []  - 0 Routine Transfer to another Facility (non-emergent  condition) []  - 0 Routine Hospital Admission (non-emergent condition) X- 1 15 New Admissions / Biomedical engineer / Ordering NPWT Apligraf, etc. , []  - 0 Emergency Hospital Admission (emergent condition) X- 1 10 Simple Discharge Coordination []  - 0 Complex (extensive) Discharge Coordination PROCESS -  Special Needs []  - 0 Pediatric / Minor Patient Management []  - 0 Isolation Patient Management []  - 0 Hearing / Language / Visual special needs []  - 0 Assessment of Community assistance (transportation, D/C planning, etc.) []  - 0 Additional assistance / Altered mentation []  - 0 Support Surface(s) Assessment (bed, cushion, seat, etc.) INTERVENTIONS - Wound Cleansing / Measurement []  - 0 Simple Wound Cleansing - one wound []  - 0 Complex Wound Cleansing - multiple wounds []  - 0 Wound Imaging (photographs - any number of wounds) []  - 0 Wound Tracing (instead of photographs) []  - 0 Simple Wound Measurement - one wound []  - 0 Complex Wound Measurement - multiple wounds INTERVENTIONS - Wound Dressings []  - 0 Small Wound Dressing one or multiple wounds []  - 0 Medium Wound Dressing one or multiple wounds []  - 0 Large Wound Dressing one or multiple wounds []  - 0 Application of Medications - topical []  - 0 Application of Medications - injection INTERVENTIONS - Miscellaneous []  - 0 External ear exam []  - 0 Specimen Collection (cultures, biopsies, blood, body fluids, etc.) []  - 0 Specimen(s) / Culture(s) sent or taken to Lab for analysis []  - 0 Patient Transfer (multiple staff / Civil Service fast streamer / Similar devices) []  - 0 Simple Staple / Suture removal (25 or less) []  - 0 Complex Staple / Suture removal (26 or more) []  - 0 Hypo / Hyperglycemic Management (close monitor of Blood Glucose) []  - 0 Ankle / Brachial Index (ABI) - do not check if billed separately X- 1 5 Vital Signs Has the patient been seen at the hospital within the last three years: Yes Total Score:  80 Level Of Care: New/Established - Level 3 Electronic Signature(s) Signed: 05/27/2021 5:32:03 PM By: Rhae Hammock RN Entered By: Rhae Hammock on 05/27/2021 10:28:28 -------------------------------------------------------------------------------- Lower Extremity Assessment Details Patient Name: Date of Service: Jackie [name], [place] M. 05/27/2021 9:15 A M Medical Record Number: [id number] Patient Account Number: [id number] Date of Birth/Sex: Treating RN: Nov 01, 1932 (87 y.o. [name] Primary Care Larita Deremer: [name] Other Clinician: Referring Sholom Dulude: Treating Jameisha Stofko/Extender: [name] in Treatment: 0 Edema Assessment Assessed: [name]: Yes] [name]: Yes] Edema: [Left: Yes] [Right: Yes] Calf Left: Right: Point of Measurement: 28 cm From Medial Instep 33 cm 31.5 cm Ankle Left: Right: Point of Measurement: 10 cm From Medial Instep 20.5 cm 21.5 cm Vascular Assessment Pulses: Dorsalis Pedis Palpable: [Left:Yes] [Right:Yes] Electronic Signature(s) Signed: 05/27/2021 6:39:25 PM By: Lorrin Jackson Entered By: Lorrin Jackson on 05/27/2021 09:54:00 -------------------------------------------------------------------------------- Multi Wound Chart Details Patient Name: Date of Service: Jackie [name], Jackie [name]. 05/27/2021 9:15 A M Medical Record Number: [id number] Patient Account Number: [id number] Date of Birth/Sex: Treating RN: 04/26/1932 (87 y.o. [name], [place] Primary Care Javiana Anwar: [name] Other Clinician: Referring Zailynn Brandel: Treating Waldine Zenz/Extender: [name] in Treatment: 0 Vital Signs Height(in): Pulse(bpm): 62 Weight(lbs): Blood Pressure(mmHg): 167/81 Body Mass Index(BMI): Temperature(F): 97.8 Respiratory Rate(breaths/min): 18 Wound Assessments Treatment Notes Electronic Signature(s) Signed: 05/27/2021 11:54:58 AM By: Kalman Shan DO Signed: 05/27/2021 5:32:03 PM By: Rhae Hammock RN Entered By: Kalman Shan on 05/27/2021 11:42:05 -------------------------------------------------------------------------------- Multi-Disciplinary Care Plan Details Patient Name: Date of Service: Jackie [name], Jackie [name]. 05/27/2021 9:15 A M Medical Record Number: [id number] Patient Account Number: [id number] Date of Birth/Sex: Treating RN: 04/25/1932 (87 y.o. [name], [place] Primary Care [place]: [name] Other Clinician: Referring Hosam Mcfetridge: Treating Ahja Martello/Extender: [name] in Treatment: 0 Active Inactive Electronic Signature(s) Signed:  05/27/2021 5:32:03 PM By: Rhae Hammock RN Entered By: Rhae Hammock on 05/27/2021 10:27:12 -------------------------------------------------------------------------------- Pain Assessment Details Patient Name: Date of Service: Jackie [name], Jackie [name]. 05/27/2021 9:15 A M Medical Record Number: [id number] Patient Account Number: [id number] Date of Birth/Sex: Treating RN: [birth year] (87 y.o. [name] Primary Care Kavan Devan: [name] Other Clinician: Referring Clelia Trabucco: Treating Hatley Henegar/Extender: [name] in Treatment: 0 Active Problems Location of Pain Severity and Description of Pain Patient Has Paino No Site Locations Pain Management and Medication Current Pain Management: Electronic Signature(s) Signed: 05/27/2021 6:39:25 PM By: Lorrin Jackson Entered By: Lorrin Jackson on 05/27/2021 09:54:59 -------------------------------------------------------------------------------- Patient/Caregiver Education Details Patient Name: Date of Service: Jackie [name], [place]. 6/30/2022andnbsp9:15 A M Medical Record Number: [id number] Patient Account Number: [id number] Date of Birth/Gender: Treating RN: 1932/07/31 (87 y.o. Benjaman Lobe Primary Care Physician: [name] Other Clinician: Referring Physician: Treating Physician/Extender:  [name] in Treatment: 0 Education Assessment Education Provided To: Patient Education Topics Provided Wound/Skin Impairment: Methods: Explain/Verbal Responses: State content correctly Electronic Signature(s) Signed: 05/27/2021 5:32:03 PM By: Rhae Hammock RN Entered By: Rhae Hammock on 05/27/2021 10:27:20 -------------------------------------------------------------------------------- Vitals Details Patient Name: Date of Service: Jackie [name], Jackie [name]. 05/27/2021 9:15 A M Medical Record Number: [id number] Patient Account Number: [id number] Date of Birth/Sex: Treating RN: 11/21/1932 (87 y.o. [name] Primary Care Tekeya Geffert: [name] Other Clinician: Referring Niesha Bame: Treating Tiffay Pinette/Extender: [name] in Treatment: 0 Vital Signs Time Taken: 09:45 Temperature (F): 97.8 Pulse (bpm): 62 Respiratory Rate (breaths/min): 18 Blood Pressure (mmHg): 167/81 Reference Range: 80 - 120 mg / dl Electronic Signature(s) Signed: 05/27/2021 6:39:25 PM By: Lorrin Jackson Entered By: Lorrin Jackson on 05/27/2021 09:49:34

## 2021-05-27 NOTE — Progress Notes (Signed)
[name], [name] [place]. ([id number]) Visit Report for 05/27/2021 Abuse/Suicide Risk Screen Details Patient Name: Date of Service: Jackie [name] CE M. 05/27/2021 9:15 A M Medical Record Number: [id number] Patient Account Number: [id number] Date of Birth/Sex: Treating RN: 1932/10/23 (87 y.o. Jackie [name] Primary Care Jackie [name]: Jackie [name] Other Clinician: Referring Jackie [name]: Treating Jackie [name]/Extender: Jackie [name] in Treatment: 0 Abuse/Suicide Risk Screen Items Answer ABUSE RISK SCREEN: Has anyone close to you tried to hurt or harm you recentlyo No Do you feel uncomfortable with anyone in your familyo No Has anyone forced you do things that you didnt want to doo No Electronic Signature(s) Signed: 05/27/2021 6:39:25 PM By: Jackie [name] Entered By: Jackie [name] on 05/27/2021 09:49:56 -------------------------------------------------------------------------------- Activities of Daily Living Details Patient Name: Date of Service: Jackie [name] CE M. 05/27/2021 9:15 A M Medical Record Number: [id number] Patient Account Number: [id number] Date of Birth/Sex: Treating RN: Oct 04, 1932 (87 y.o. Jackie [name] Primary Care Jackie [name]: Jackie [name] Other Clinician: Referring Jackie [name]: Treating Jackie [name]/Extender: Jackie [name] in Treatment: 0 Activities of Daily Living Items Answer Activities of Daily Living (Please select one for each item) Drive Automobile Completely Able T Medications ake Completely Able Use T elephone Completely Able Care for Appearance Completely Able Use T oilet Completely Able Bath / Shower Completely Able Dress Self Completely Able Feed Self Completely Able Walk Need Assistance Get In / Out Bed Completely Able Housework Completely Able Prepare Meals Completely [place] for Self Completely Able Electronic Signature(s) Signed: 05/27/2021 6:39:25 PM By: Jackie [name] Entered By:  Jackie [name] on 05/27/2021 09:50:26 -------------------------------------------------------------------------------- Education Screening Details Patient Name: Date of Service: Jackie [name], Jackie CE M. 05/27/2021 9:15 A M Medical Record Number: [id number] Patient Account Number: [id number] Date of Birth/Sex: Treating RN: 14-Nov-1932 (87 y.o. Jackie [name] Primary Care Jackie [name]: Jackie [name] Other Clinician: Referring Jackie [name]: Treating Jackie [name]/Extender: Jackie [name] in Treatment: 0 Primary Learner Assessed: Patient Learning Preferences/Education Level/Primary Language Learning Preference: Explanation, Demonstration, Printed Material Highest Education Level: High School Preferred Language: English Cognitive Barrier Language Barrier: No Translator Needed: No Memory Deficit: No Emotional Barrier: No Cultural/Religious Beliefs Affecting Medical Care: No Physical Barrier Impaired Vision: Yes Glasses Impaired Hearing: No Decreased Hand dexterity: No Knowledge/Comprehension Knowledge Level: Medium Comprehension Level: High Ability to understand written instructions: High Ability to understand verbal instructions: High Motivation Anxiety Level: Calm Cooperation: Cooperative Education Importance: Acknowledges Need Interest in Health Problems: Asks Questions Perception: Coherent Willingness to Engage in Self-Management Medium Activities: Readiness to Engage in Self-Management Medium Activities: Electronic Signature(s) Signed: 05/27/2021 6:39:25 PM By: Jackie [name] Entered By: Jackie [name] on 05/27/2021 09:51:05 -------------------------------------------------------------------------------- Fall Risk Assessment Details Patient Name: Date of Service: Jackie [name], [place]. 05/27/2021 9:15 A M Medical Record Number: [id number] Patient Account Number: [id number] Date of Birth/Sex: Treating RN: [birth year] (87 y.o. Jackie [name] Primary Care  Jackie [name]: Jackie [name] Other Clinician: Referring Jackie [name]: Treating Jackie [name]/Extender: Jackie [name] in Treatment: 0 Fall Risk Assessment Items Have you had 2 or more falls in the last 12 monthso 0 No Have you had any fall that resulted in injury in the last 12 monthso 0 No FALLS RISK SCREEN History of falling - immediate or within 3 months 0 No Secondary diagnosis (Do you have 2 or more medical diagnoseso) 0 No Ambulatory aid None/bed rest/wheelchair/nurse 0 No Crutches/cane/walker 15 Yes Furniture 0 No Intravenous therapy Access/Saline/Heparin Lock 0 No Gait/Transferring Normal/ bed rest/  wheelchair 0 Yes Weak (short steps with or without shuffle, stooped but able to lift head while walking, may seek 0 No support from furniture) Impaired (short steps with shuffle, may have difficulty arising from chair, head down, impaired 0 No balance) Mental Status Oriented to own ability 0 Yes Electronic Signature(s) Signed: 05/27/2021 6:39:25 PM By: Jackie [name] Entered By: Jackie [name] on 05/27/2021 09:51:18 -------------------------------------------------------------------------------- Foot Assessment Details Patient Name: Date of Service: Jackie [name], Jackie CE M. 05/27/2021 9:15 A M Medical Record Number: [id number] Patient Account Number: [id number] Date of Birth/Sex: Treating RN: Jul 09, 1932 (87 y.o. Jackie [name] Primary Care Jackie [name]: Jackie [name] Other Clinician: Referring Jackie [name]: Treating Jackie [name]/Extender: Jackie [name] in Treatment: 0 Foot Assessment Items Site Locations + = Sensation present, - = Sensation absent, C = Callus, U = Ulcer R = Redness, W = Warmth, M = Maceration, PU = Pre-ulcerative lesion F = Fissure, S = Swelling, D = Dryness Assessment Right: Left: Other Deformity: No No Prior Foot Ulcer: No No Prior Amputation: No No Charcot Joint: No No Ambulatory Status: Gait: Electronic  Signature(s) Signed: 05/27/2021 6:39:25 PM By: Jackie [name] Entered By: Jackie [name] on 05/27/2021 09:51:55 -------------------------------------------------------------------------------- Nutrition Risk Screening Details Patient Name: Date of Service: Jackie [name], Jackie CE M. 05/27/2021 9:15 A M Medical Record Number: [id number] Patient Account Number: [id number] Date of Birth/Sex: Treating RN: 11-23-1932 (87 y.o. Jackie [name] Primary Care Shauna Bodkins: Jackie [name] Other Clinician: Referring Myrle Wanek: Treating Ramsie Ostrander/Extender: Jackie [name] in Treatment: 0 Height (in): Weight (lbs): Body Mass Index (BMI): Nutrition Risk Screening Items Score Screening NUTRITION RISK SCREEN: I have an illness or condition that made me change the kind and/or amount of food I eat 0 No I eat fewer than two meals per day 0 No I eat few fruits and vegetables, or milk products 0 No I have three or more drinks of beer, liquor or wine almost every day 0 No I have tooth or mouth problems that make it hard for me to eat 0 No I don't always have enough money to buy the food I need 0 No I eat alone most of the time 0 No I take three or more different prescribed or over-the-counter drugs a day 1 Yes Without wanting to, I have lost or gained 10 pounds in the last six months 2 Yes I am not always physically able to shop, cook and/or feed myself 0 No Nutrition Protocols Good Risk Protocol Moderate Risk Protocol 0 Provide education on nutrition High Risk Proctocol Risk Level: Moderate Risk Score: 3 Electronic Signature(s) Signed: 05/27/2021 6:39:25 PM By: Jackie [name] Entered By: Jackie [name] on 05/27/2021 09:52:07

## 2021-05-27 NOTE — Progress Notes (Signed)
Jackie, [name] [place]. ([id number]) Visit Report for 05/27/2021 Chief Complaint Document Details Patient Name: Date of Service: Jackie [name] CE M. 05/27/2021 9:15 A M Medical Record Number: [id number] Patient Account Number: [id number] Date of Birth/Sex: Treating RN: 25-Apr-1932 (87 y.o. Jackie [name] Primary Care Provider: [name] Other Clinician: Referring Provider: Treating Provider/Extender: [name] in Treatment: 0 Information Obtained from: Patient Chief Complaint Bilateral lower extremity edema. Dry, Flaking skin to the top of the right foot Electronic Signature(s) Signed: 05/27/2021 11:54:58 AM By: Kalman Shan DO Entered By: Kalman Shan on 05/27/2021 11:42:53 -------------------------------------------------------------------------------- HPI Details Patient Name: Date of Service: Jackie [name], [name] 05/27/2021 9:15 A M Medical Record Number: [id number] Patient Account Number: [id number] Date of Birth/Sex: Treating RN: 1932/10/09 (87 y.o. [name], Lauren Primary Care Provider: [name] Other Clinician: Referring Provider: Treating Provider/Extender: [name] in Treatment: 0 History of Present Illness HPI Description: ADMISSION 07/17/2020 This is an 87 year old woman who is referred by her nurse practitioner at [facility] who has been doing treatment for the wound on the right medial lower leg and ankle. Apparently 1 point a substantial wound however it is largely epithelialized now. Patient states its been there for about 5 or 6 weeks. On the background she has changes in the skin in this area I think the go back several years to when she had a left hip fracture. Nevertheless she does not have a wound history that I could elicit. As far as I know that they had been using TCA, Mepitel under an Unna boot they have been changing this weekly at [facility] [employer]. She also  received a course of doxycycline. Past medical history very little that I can find in [place] link. She has a history of venous insufficiency bilaterally and a left hip fracture hypertension and a history of thyroidectomy for thyroid cancer. ABI in our clinic was 1.13 on the left 8/27; the patient is substantial initial wound by her description on the left medial lower leg and ankle is completely epithelialized. She has chronic venous insufficiency, dermatosclerosis and probably lymphedema. She does not have stockings and states that she would have trouble getting over the toe stockings on in any fashion because of hip problems except 9/3; this is a patient with chronic venous insufficiency with a particularly difficult area on her left medial lower leg and ankle. My assessment of this is that she has chronic venous insufficiency and chronic venous hypertension. And at least on the left medial ankle stasis dermatitis. The area still looks angry and inflamed. Nevertheless it is epithelialized. I do not believe there is active infection. We have ordered her bilateral juxta lite stockings. These should be easier for her to put on but need to be adjusted to 30/40 compression. If this is not successful in maintaining this area I think she is going to need formal reflux studies to see if she might be a candidate for ablation of the greater saphenous vein 08/05/2020 upon evaluation today patient appears to be doing poorly in regard to her lower extremity. Unfortunately this has reopened after being healed on Friday. She tells me she was wearing her compression wrap but that even when she woke up in the morning on Saturday morning she was leaking. Fortunately there is no signs of active infection at this time systemically though there is some question of a thing locally. She does have juxta light compression bilaterally. 08/12/2020 on evaluation  today patient appears to be doing well with regard to her leg  ulcer which actually is closed again at this point. With that being said she is still having some issues currently with very thin skin that has come back over and again with her swelling if we do not wrap around afraid this is can open right back up. Nonetheless I do believe that she should proceed with the vascular testing next Thursday as previously ordered and recommended. Fortunately there is no signs of active infection at this time. 9/23; this is a patient I discharged a few weeks ago. Apparently she had a reopening on the left leg medially. This is closed now. We have ordered venous reflux studies but they are not until 10/5 at [place] 10/29/2020 Patient is a now 87 year old female. She is somewhat frail however still lives at home largely on her own. We had her in the clinic in August and September 2021 with a wound on the left medial lower extremity in the setting of chronic venous insufficiency and lymphedema. This closed reasonably easily and we discharged her to juxta lite stockings. After her discharge she had her venous reflux studies at interventional radiology. She was seen in consultation by Dr. Earleen Newport. She did not have evidence of thrombosis. She did have significant reflux of the bilateral greater saphenous vein on the right there was reflux from the saphenofemoral junction to the proximal calf where there is further decompression through network of varicosities on the left there was reflux from the saphenofemoral junction to the distal calf there is also a varicose network decompressing the calf. She was recommended I think for venous ablations of the greater saphenous vein on the left however she is wanting to defer that it into the new year. She tells me that very shortly after she left here the last time she was not able to put the juxta lite stocking on largely the inner sleeve. She developed swelling and then leakage and skin breakdown again in the left  medial lower extremity. She was seen by podiatry on 3 occasions I think they were applying Unna boots. Her son says that at one point the [facility] boot was not put on properly his wife who is a nurse had to adjust this and the swelling is actually somewhat better. She arrives in clinic today with very significant swelling of the proximal two thirds of her lower extremity erythema superficial skin breakdown on the left medial ankle and calf and weeping edema fluid. Past medical history not much change from last time she has chronic venous insufficiency with very significant reflux and lymphedema. She states that she has trouble bending over to get the inner part of the juxta lite stocking on because of hip problems. Her ABI in this clinic was not repeated but the last time she was here it was 1.13 she was not felt to have an arterial issue 12/9; the patient's wound on the left medial lower leg is again closed. She has chronic hemosiderin and stasis dermatitis left greater than right lipodermatosclerosis left greater than right. On the right leg today we are not wrapping she says she hit this on a screen door however there is no open area here. The right leg has a lot of swelling 12/21; this is a patient we discharged 10 days ago. She had bilateral juxta lite stockings and was supposed to be getting compression pumps from her son. According the patient her son tested positive for Covid and has  been on quarantine and she is unable to get the internal part of the juxta lites on properly. She therefore has weeping edema left greater than right medial and is back in clinic. 12/28. Patient comes in with no open areas on the right leg. She still has very inflamed tenderness on the left medial ankle and lower calf. This could be cellulitis or stasis dermatitis. Also similar changes on the left lateral 1/6; patient returns to clinic. We did not wrap her right leg last time we wrapped the left. I gave her a course  of antibiotics because of tenderness on the left medial ankle possible cellulitis versus severe stasis dermatitis. Everything is taken care of on the left where the 3 layer compression was. Unfortunately on the right she has inflamed edema on the right medial ankle and right lateral ankle. Poorly controlled edema. She Kaven with her juxta lite stocking on but it is not wrapped tightly enough to control the swelling. 1/13; she comes back in today after a week of compression wraps on all her wounds are healed. Her skin is dry and flaky she has stasis dermatitis of predominantly in the right medial greater than left medial lower leg extending into her foot on the right. She has a bag of stockings and related apparel including juxta lite stockings, over the toe compression stockings, a sock Donner. She is for 1 reason or another not able to get these things on at home on her own she comes in today with her daughter from [place]. She got external compression pumps delivered the other day but has not started to use them. The other major problem is she lives alone 12/23/2020 upon evaluation today patient unfortunately presents for readmission due to issues that she is having with weeping of her lower extremities along with increased discomfort and pain. She also has increased redness. I am concerned about infection here among other things. She has not been using the compression as she tells me that this is not easy for her to do. She is also not been using her lymphedema pumps enough to even know "whether they would work or not" 2/3; patient came into the clinic last week with concern of cellulitis increasing edema with weeping. She was put on doxycycline she comes in today with dry skin once again but no wounds and no evidence of infection. She probably had stasis dermatitis. She is here with her daughter. As usual we will long discussion about how were going to arrange to have the patient put stockings  on. Since she was last here she had a fall is apparently fractured L3 in the lower thoracic vertebrae. She has some discomfort. Certainly in this situation she could not be expected to put on compression stockings. She has not use the compression pumps we ordered either. 2/17; we discharge the patient 2 weeks ago with bilateral juxta lite stockings. We also suggested in-home care in the morning to help her get the stockings on with the options of being perhaps assisted living or going to live in [place] with her daughter however she is done none of the above. She called the clinic multiple times to report weeping edema. She states today than on Monday, 3 days ago her sheets were soaked with fluid when she woke up in the morning. 2/24; patient once again today looks fine. There is no swelling no weeping edema and no wounds. She brought in her stockings. 01/28/2021 upon evaluation today patient's wounds actually are showing signs of  being completely healed I do not see anything open she does have a lot of dry skin at this point unfortunately. Fortunately there is no signs of active infection however and in general I feel like she is doing quite well. No fevers, chills, nausea, vomiting, or diarrhea. 02/16/2021; patient was discharged almost 3 weeks ago. She is back in clinic today again with weeping areas on the left lateral lower leg and on the right lower leg. Severe stasis dermatitis poorly controlled edema. She apparently has been more compliant with her juxta lites although I did not actually see these on and I am not certain that they were on with enough pressure. She does not complain of pain just the leaking fluid out of the areas 3/29; arrives in clinic today everything is closed. Her edema control is actually quite good. She has been in compression wraps however. She has marked chronic stasis dermatitis skin changes on the right dry flaking skin. 4/11; 2 weeks the patient arrives in clinic with  severe edema in her right leg stasis dermatitis loss of surface epithelium almost circumferentially in the right lower leg. The left leg has some edema but no open wound area. In discussion it turns out that she does not wear her juxta lite product properly and she does not wear the inner stocking layer. We got her compression pumps but she does not seem to abuse them since they were set up according to her daughter. 4/18; this is a patient who came back to clinic last week. Both legs were put under compression due to leaking edema fluid. The left leg is completely healed today whereas the right leg still has some stasis dermatitis. There is no open wound on either side. They apparently have got her do agreed to help to come in to the home and help her with the stockings. 4/26; there are no open wounds to the legs bilaterally. Patient has her compression stockings. She also reports having her lymphedema pumps at home. She is also getting an aide that will help her with her stockings. She has no complaints today. 6/30; patient presents because her aide noticed more weeping to her lower extremities. There are no open wounds. Patient is concerned about the dry flaking skin to the dorsal aspect of the right foot over the toes. She denies pain, increased warmth or erythema to her lower extremities bilaterally. Electronic Signature(s) Signed: 05/27/2021 11:54:58 AM By: Kalman Shan DO Entered By: Kalman Shan on 05/27/2021 11:43:54 -------------------------------------------------------------------------------- Physical Exam Details Patient Name: Date of Service: Jackie [name], [name] 05/27/2021 9:15 A M Medical Record Number: [id number] Patient Account Number: [id number] Date of Birth/Sex: Treating RN: 03-06-1932 (87 y.o. Jackie [name] Primary Care Provider: [name] Other Clinician: Referring Provider: Treating Provider/Extender: [name] in Treatment:  0 Constitutional respirations regular, non-labored and within target range for patient.. Cardiovascular 2+ dorsalis pedis/posterior tibialis pulses. Psychiatric pleasant and cooperative. Notes Venous stasis dermatitis bilaterally to the lower extremities. 2+ pitting edema to the knees. Some erythematous dry flaking skin to the dorsal aspect of the toes on the right foot. Some maceration noted to the webbing of the right foot digits Electronic Signature(s) Signed: 05/27/2021 11:54:58 AM By: Kalman Shan DO Entered By: Kalman Shan on 05/27/2021 11:44:38 -------------------------------------------------------------------------------- Physician Orders Details Patient Name: Date of Service: Jackie [name], [name] 05/27/2021 9:15 A M Medical Record Number: [id number] Patient Account Number: [id number] Date of Birth/Sex: Treating RN: June 22, 1932 (87 y.o. F) Hollie Salk,  Lauren Primary Care Provider: [name] Other Clinician: Referring Provider: Treating Provider/Extender: [name] in Treatment: 0 Verbal / Phone Orders: No Diagnosis Coding ICD-10 Coding Code Description I87.2 Venous insufficiency (chronic) (peripheral) Follow-up Appointments ppointment in 2 weeks. - Dr. Heber [place] Return A Edema Control - Lymphedema / SCD / Other Avoid standing for long periods of time. Patient to wear own compression stockings every day. - Juxtalites bilaterally- apply in the AM and remove right before bed Moisturize legs daily. - YOu can use lamisil or anti-fungal cream to toes and feet daily. Electronic Signature(s) Signed: 05/27/2021 11:54:58 AM By: Kalman Shan DO Entered By: Kalman Shan on 05/27/2021 11:44:52 -------------------------------------------------------------------------------- Problem List Details Patient Name: Date of Service: Jackie [name], [name] 05/27/2021 9:15 A M Medical Record Number: [id number] Patient Account Number: [id number] Date  of Birth/Sex: Treating RN: 08-Jan-1932 (87 y.o. [name], Lauren Primary Care Provider: [name] Other Clinician: Referring Provider: Treating Provider/Extender: [name] in Treatment: 0 Active Problems ICD-10 Encounter Code Description Active Date MDM Diagnosis I87.2 Venous insufficiency (chronic) (peripheral) 05/27/2021 No Yes Inactive Problems Resolved Problems Electronic Signature(s) Signed: 05/27/2021 11:54:58 AM By: Kalman Shan DO Entered By: Kalman Shan on 05/27/2021 11:42:01 -------------------------------------------------------------------------------- Progress Note Details Patient Name: Date of Service: Jackie [name], [name] 05/27/2021 9:15 A M Medical Record Number: [id number] Patient Account Number: [id number] Date of Birth/Sex: Treating RN: [birth year] (87 y.o. Jackie [name] Primary Care Provider: [name] Other Clinician: Referring Provider: Treating Provider/Extender: [name] in Treatment: 0 Subjective Chief Complaint Information obtained from Patient Bilateral lower extremity edema. Dry, Flaking skin to the top of the right foot History of Present Illness (HPI) ADMISSION 07/17/2020 This is an 87 year old woman who is referred by her nurse practitioner at [facility] who has been doing treatment for the wound on the right medial lower leg and ankle. Apparently 1 point a substantial wound however it is largely epithelialized now. Patient states its been there for about 5 or 6 weeks. On the background she has changes in the skin in this area I think the go back several years to when she had a left hip fracture. Nevertheless she does not have a wound history that I could elicit. As far as I know that they had been using TCA, Mepitel under an Unna boot they have been changing this weekly at [facility]. She also received a course of doxycycline. Past  medical history very little that I can find in [place] link. She has a history of venous insufficiency bilaterally and a left hip fracture hypertension and a history of thyroidectomy for thyroid cancer. ABI in our clinic was 1.13 on the left 8/27; the patient is substantial initial wound by her description on the left medial lower leg and ankle is completely epithelialized. She has chronic venous insufficiency, dermatosclerosis and probably lymphedema. She does not have stockings and states that she would have trouble getting over the toe stockings on in any fashion because of hip problems except 9/3; this is a patient with chronic venous insufficiency with a particularly difficult area on her left medial lower leg and ankle. My assessment of this is that she has chronic venous insufficiency and chronic venous hypertension. And at least on the left medial ankle stasis dermatitis. The area still looks angry and inflamed. Nevertheless it is epithelialized. I do not believe there is active infection. We have ordered her bilateral juxta lite stockings. These  should be easier for her to put on but need to be adjusted to 30/40 compression. If this is not successful in maintaining this area I think she is going to need formal reflux studies to see if she might be a candidate for ablation of the greater saphenous vein 08/05/2020 upon evaluation today patient appears to be doing poorly in regard to her lower extremity. Unfortunately this has reopened after being healed on Friday. She tells me she was wearing her compression wrap but that even when she woke up in the morning on Saturday morning she was leaking. Fortunately there is no signs of active infection at this time systemically though there is some question of a thing locally. She does have juxta light compression bilaterally. 08/12/2020 on evaluation today patient appears to be doing well with regard to her leg ulcer which actually is closed again at  this point. With that being said she is still having some issues currently with very thin skin that has come back over and again with her swelling if we do not wrap around afraid this is can open right back up. Nonetheless I do believe that she should proceed with the vascular testing next Thursday as previously ordered and recommended. Fortunately there is no signs of active infection at this time. 9/23; this is a patient I discharged a few weeks ago. Apparently she had a reopening on the left leg medially. This is closed now. We have ordered venous reflux studies but they are not until 10/5 at [place] 10/29/2020 Patient is a now 87 year old female. She is somewhat frail however still lives at home largely on her own. We had her in the clinic in August and September 2021 with a wound on the left medial lower extremity in the setting of chronic venous insufficiency and lymphedema. This closed reasonably easily and we discharged her to juxta lite stockings. After her discharge she had her venous reflux studies at interventional radiology. She was seen in consultation by Dr. Earleen Newport. She did not have evidence of thrombosis. She did have significant reflux of the bilateral greater saphenous vein on the right there was reflux from the saphenofemoral junction to the proximal calf where there is further decompression through network of varicosities on the left there was reflux from the saphenofemoral junction to the distal calf there is also a varicose network decompressing the calf. She was recommended I think for venous ablations of the greater saphenous vein on the left however she is wanting to defer that it into the new year. She tells me that very shortly after she left here the last time she was not able to put the juxta lite stocking on largely the inner sleeve. She developed swelling and then leakage and skin breakdown again in the left medial lower extremity. She was seen by  podiatry on 3 occasions I think they were applying Unna boots. Her son says that at one point the [facility] boot was not put on properly his wife who is a nurse had to adjust this and the swelling is actually somewhat better. She arrives in clinic today with very significant swelling of the proximal two thirds of her lower extremity erythema superficial skin breakdown on the left medial ankle and calf and weeping edema fluid. Past medical history not much change from last time she has chronic venous insufficiency with very significant reflux and lymphedema. She states that she has trouble bending over to get the inner part of the juxta lite stocking on  because of hip problems. Her ABI in this clinic was not repeated but the last time she was here it was 1.13 she was not felt to have an arterial issue 12/9; the patient's wound on the left medial lower leg is again closed. She has chronic hemosiderin and stasis dermatitis left greater than right lipodermatosclerosis left greater than right. On the right leg today we are not wrapping she says she hit this on a screen door however there is no open area here. The right leg has a lot of swelling 12/21; this is a patient we discharged 10 days ago. She had bilateral juxta lite stockings and was supposed to be getting compression pumps from her son. According the patient her son tested positive for Covid and has been on quarantine and she is unable to get the internal part of the juxta lites on properly. She therefore has weeping edema left greater than right medial and is back in clinic. 12/28. Patient comes in with no open areas on the right leg. She still has very inflamed tenderness on the left medial ankle and lower calf. This could be cellulitis or stasis dermatitis. Also similar changes on the left lateral 1/6; patient returns to clinic. We did not wrap her right leg last time we wrapped the left. I gave her a course of antibiotics because of tenderness on  the left medial ankle possible cellulitis versus severe stasis dermatitis. Everything is taken care of on the left where the 3 layer compression was. Unfortunately on the right she has inflamed edema on the right medial ankle and right lateral ankle. Poorly controlled edema. She Kaven with her juxta lite stocking on but it is not wrapped tightly enough to control the swelling. 1/13; she comes back in today after a week of compression wraps on all her wounds are healed. Her skin is dry and flaky she has stasis dermatitis of predominantly in the right medial greater than left medial lower leg extending into her foot on the right. She has a bag of stockings and related apparel including juxta lite stockings, over the toe compression stockings, a sock Donner. She is for 1 reason or another not able to get these things on at home on her own she comes in today with her daughter from [place]. She got external compression pumps delivered the other day but has not started to use them. The other major problem is she lives alone 12/23/2020 upon evaluation today patient unfortunately presents for readmission due to issues that she is having with weeping of her lower extremities along with increased discomfort and pain. She also has increased redness. I am concerned about infection here among other things. She has not been using the compression as she tells me that this is not easy for her to do. She is also not been using her lymphedema pumps enough to even know "whether they would work or not" 2/3; patient came into the clinic last week with concern of cellulitis increasing edema with weeping. She was put on doxycycline she comes in today with dry skin once again but no wounds and no evidence of infection. She probably had stasis dermatitis. She is here with her daughter. As usual we will long discussion about how were going to arrange to have the patient put stockings on. Since she was last here she had a fall  is apparently fractured L3 in the lower thoracic vertebrae. She has some discomfort. Certainly in this situation she could not be expected to put  on compression stockings. She has not use the compression pumps we ordered either. 2/17; we discharge the patient 2 weeks ago with bilateral juxta lite stockings. We also suggested in-home care in the morning to help her get the stockings on with the options of being perhaps assisted living or going to live in [place] with her daughter however she is done none of the above. She called the clinic multiple times to report weeping edema. She states today than on Monday, 3 days ago her sheets were soaked with fluid when she woke up in the morning. 2/24; patient once again today looks fine. There is no swelling no weeping edema and no wounds. She brought in her stockings. 01/28/2021 upon evaluation today patient's wounds actually are showing signs of being completely healed I do not see anything open she does have a lot of dry skin at this point unfortunately. Fortunately there is no signs of active infection however and in general I feel like she is doing quite well. No fevers, chills, nausea, vomiting, or diarrhea. 02/16/2021; patient was discharged almost 3 weeks ago. She is back in clinic today again with weeping areas on the left lateral lower leg and on the right lower leg. Severe stasis dermatitis poorly controlled edema. She apparently has been more compliant with her juxta lites although I did not actually see these on and I am not certain that they were on with enough pressure. She does not complain of pain just the leaking fluid out of the areas 3/29; arrives in clinic today everything is closed. Her edema control is actually quite good. She has been in compression wraps however. She has marked chronic stasis dermatitis skin changes on the right dry flaking skin. 4/11; 2 weeks the patient arrives in clinic with severe edema in her right leg stasis  dermatitis loss of surface epithelium almost circumferentially in the right lower leg. The left leg has some edema but no open wound area. In discussion it turns out that she does not wear her juxta lite product properly and she does not wear the inner stocking layer. We got her compression pumps but she does not seem to abuse them since they were set up according to her daughter. 4/18; this is a patient who came back to clinic last week. Both legs were put under compression due to leaking edema fluid. The left leg is completely healed today whereas the right leg still has some stasis dermatitis. There is no open wound on either side. They apparently have got her do agreed to help to come in to the home and help her with the stockings. 4/26; there are no open wounds to the legs bilaterally. Patient has her compression stockings. She also reports having her lymphedema pumps at home. She is also getting an aide that will help her with her stockings. She has no complaints today. 6/30; patient presents because her aide noticed more weeping to her lower extremities. There are no open wounds. Patient is concerned about the dry flaking skin to the dorsal aspect of the right foot over the toes. She denies pain, increased warmth or erythema to her lower extremities bilaterally. Patient History Information obtained from Patient. Allergies codeine, Zithromax Family History Cancer - Siblings, Diabetes - Siblings, Heart Disease - Father, Hypertension - Mother,Father, Lung Disease - Siblings, Stroke - Mother, No family history of Hereditary Spherocytosis, Kidney Disease, Seizures, Thyroid Problems, Tuberculosis. Social History Never smoker, Marital Status - Widowed, Alcohol Use - Never, Drug Use - No History,  Caffeine Use - Rarely. Medical History Eyes Denies history of Cataracts, Glaucoma, Optic Neuritis Ear/Nose/Mouth/Throat Denies history of Chronic sinus problems/congestion, Middle ear  problems Hematologic/Lymphatic Denies history of Anemia, Hemophilia, Human Immunodeficiency Virus, Lymphedema, Sickle Cell Disease Respiratory Denies history of Aspiration, Asthma, Chronic Obstructive Pulmonary Disease (COPD), Pneumothorax, Sleep Apnea, Tuberculosis Cardiovascular Patient has history of Hypertension Denies history of Angina, Arrhythmia, Congestive Heart Failure, Coronary Artery Disease, Deep Vein Thrombosis, Hypotension, Myocardial Infarction, Peripheral Arterial Disease, Peripheral Venous Disease, Phlebitis, Vasculitis Gastrointestinal Denies history of Cirrhosis , Colitis, Crohnoos, Hepatitis A, Hepatitis B, Hepatitis C Endocrine Denies history of Type I Diabetes, Type II Diabetes Genitourinary Denies history of End Stage Renal Disease Immunological Denies history of Lupus Erythematosus, Raynaudoos, Scleroderma Integumentary (Skin) Denies history of History of Burn Musculoskeletal Denies history of Gout, Rheumatoid Arthritis, Osteoarthritis, Osteomyelitis Neurologic Denies history of Dementia, Neuropathy, Quadriplegia, Paraplegia, Seizure Disorder Oncologic Denies history of Received Chemotherapy, Received Radiation Psychiatric Denies history of Anorexia/bulimia, Confinement Anxiety Objective Constitutional respirations regular, non-labored and within target range for patient.. Vitals Time Taken: 9:45 AM, Temperature: 97.8 F, Pulse: 62 bpm, Respiratory Rate: 18 breaths/min, Blood Pressure: 167/81 mmHg. Cardiovascular 2+ dorsalis pedis/posterior tibialis pulses. Psychiatric pleasant and cooperative. General Notes: Venous stasis dermatitis bilaterally to the lower extremities. 2+ pitting edema to the knees. Some erythematous dry flaking skin to the dorsal aspect of the toes on the right foot. Some maceration noted to the webbing of the right foot digits Assessment Active Problems ICD-10 Venous insufficiency (chronic) (peripheral) Patient has no open  wounds. Her legs are not weeping. I recommended she continue to use her juxta lite compressions bilaterally daily. Edema does look well controlled and are significantly improved since she was last seen. She does have some flaking and erythema to the dorsal aspect of her toes. I recommended using some antifungal cream to this area. I will see her in 2 weeks Plan Follow-up Appointments: Return Appointment in 2 weeks. - Dr. Heber [place] Edema Control - Lymphedema / SCD / Other: Avoid standing for long periods of time. Patient to wear own compression stockings every day. - Juxtalites bilaterally- apply in the AM and remove right before bed Moisturize legs daily. - YOu can use lamisil or anti-fungal cream to toes and feet daily. 1. Juxta light compressions bilaterally 2. Antifungal cream to the dorsal aspect of the right foot Electronic Signature(s) Signed: 05/27/2021 11:54:58 AM By: Kalman Shan DO Entered By: Kalman Shan on 05/27/2021 11:46:50 -------------------------------------------------------------------------------- HxROS Details Patient Name: Date of Service: Jackie [name], [name] 05/27/2021 9:15 A M Medical Record Number: [id number] Patient Account Number: [id number] Date of Birth/Sex: Treating RN: 22-Apr-1932 (87 y.o. [name] Primary Care Provider: [name] Other Clinician: Referring Provider: Treating Provider/Extender: [name] in Treatment: 0 Information Obtained From Patient Eyes Medical History: Negative for: Cataracts; Glaucoma; Optic Neuritis Ear/Nose/Mouth/Throat Medical History: Negative for: Chronic sinus problems/congestion; Middle ear problems Hematologic/Lymphatic Medical History: Negative for: Anemia; Hemophilia; Human Immunodeficiency Virus; Lymphedema; Sickle Cell Disease Respiratory Medical History: Negative for: Aspiration; Asthma; Chronic Obstructive Pulmonary Disease (COPD); Pneumothorax; Sleep Apnea;  Tuberculosis Cardiovascular Medical History: Positive for: Hypertension Negative for: Angina; Arrhythmia; Congestive Heart Failure; Coronary Artery Disease; Deep Vein Thrombosis; Hypotension; Myocardial Infarction; Peripheral Arterial Disease; Peripheral Venous Disease; Phlebitis; Vasculitis Gastrointestinal Medical History: Negative for: Cirrhosis ; Colitis; Crohns; Hepatitis A; Hepatitis B; Hepatitis C Endocrine Medical History: Negative for: Type I Diabetes; Type II Diabetes Genitourinary Medical History: Negative for: End Stage Renal Disease Immunological Medical History: Negative for:  Lupus Erythematosus; Raynauds; Scleroderma Integumentary (Skin) Medical History: Negative for: History of Burn Musculoskeletal Medical History: Negative for: Gout; Rheumatoid Arthritis; Osteoarthritis; Osteomyelitis Neurologic Medical History: Negative for: Dementia; Neuropathy; Quadriplegia; Paraplegia; Seizure Disorder Oncologic Medical History: Negative for: Received Chemotherapy; Received Radiation Psychiatric Medical History: Negative for: Anorexia/bulimia; Confinement Anxiety Immunizations Pneumococcal Vaccine: Received Pneumococcal Vaccination: No Implantable Devices None Family and Social History Cancer: Yes - Siblings; Diabetes: Yes - Siblings; Heart Disease: Yes - Father; Hereditary Spherocytosis: No; Hypertension: Yes - Mother,Father; Kidney Disease: No; Lung Disease: Yes - Siblings; Seizures: No; Stroke: Yes - Mother; Thyroid Problems: No; Tuberculosis: No; Never smoker; Marital Status - Widowed; Alcohol Use: Never; Drug Use: No History; Caffeine Use: Rarely; Financial Concerns: No; Food, Clothing or Shelter Needs: No; Support System Lacking: No; Transportation Concerns: No Electronic Signature(s) Signed: 05/27/2021 11:54:58 AM By: Kalman Shan DO Signed: 05/27/2021 6:39:25 PM By: Lorrin Jackson Entered By: Lorrin Jackson on 05/27/2021  09:49:49 -------------------------------------------------------------------------------- SuperBill Details Patient Name: Date of Service: Jackie [name], [name] 05/27/2021 Medical Record Number: [id number] Patient Account Number: [id number] Date of Birth/Sex: Treating RN: September 24, 1932 (87 y.o. [name], Lauren Primary Care Provider: [name] Other Clinician: Referring Provider: Treating Provider/Extender: [name] in Treatment: 0 Diagnosis Coding ICD-10 Codes Code Description I87.2 Venous insufficiency (chronic) (peripheral) Facility Procedures CPT4 Code: [id number] Description: 26378 - WOUND CARE VISIT-LEV 3 EST PT Modifier: Quantity: 1 Physician Procedures : CPT4 Code Description Modifier [id number] - WC PHYS LEVEL 3 - EST PT ICD-10 Diagnosis Description I87.2 Venous insufficiency (chronic) (peripheral) Quantity: 1 Electronic Signature(s) Signed: 05/27/2021 11:54:58 AM By: Kalman Shan DO Entered By: Kalman Shan on 05/27/2021 11:54:36

## 2021-06-02 ENCOUNTER — Ambulatory Visit: Payer: Medicare Other | Admitting: Podiatry

## 2021-06-04 ENCOUNTER — Ambulatory Visit: Payer: Medicare Other | Admitting: Podiatry

## 2021-06-10 ENCOUNTER — Encounter (HOSPITAL_BASED_OUTPATIENT_CLINIC_OR_DEPARTMENT_OTHER): Payer: Medicare Other | Admitting: Internal Medicine

## 2021-07-23 ENCOUNTER — Other Ambulatory Visit (HOSPITAL_COMMUNITY): Payer: Self-pay | Admitting: *Deleted

## 2021-07-26 ENCOUNTER — Other Ambulatory Visit: Payer: Self-pay

## 2021-07-26 ENCOUNTER — Ambulatory Visit (HOSPITAL_COMMUNITY)
Admission: RE | Admit: 2021-07-26 | Discharge: 2021-07-26 | Disposition: A | Discharge status: Home / Self Care | Payer: Medicare Other | Source: Ambulatory Visit | Attending: Endocrinology | Admitting: Endocrinology

## 2021-07-26 DIAGNOSIS — M810 Age-related osteoporosis without current pathological fracture: Secondary | ICD-10-CM | POA: Insufficient documentation

## 2021-07-26 MED ORDER — DENOSUMAB 60 MG/ML ~~LOC~~ SOSY
60.0000 mg | PREFILLED_SYRINGE | Freq: Once | SUBCUTANEOUS | Status: AC
  Administered 2021-07-26: 60 mg via SUBCUTANEOUS

## 2022-01-28 ENCOUNTER — Other Ambulatory Visit (HOSPITAL_COMMUNITY): Payer: Self-pay | Admitting: *Deleted

## 2022-01-31 ENCOUNTER — Ambulatory Visit (HOSPITAL_COMMUNITY)
Admission: RE | Admit: 2022-01-31 | Discharge: 2022-01-31 | Disposition: A | Discharge status: Home / Self Care | Payer: Medicare Other | Source: Ambulatory Visit | Attending: Endocrinology | Admitting: Endocrinology

## 2022-01-31 DIAGNOSIS — M810 Age-related osteoporosis without current pathological fracture: Secondary | ICD-10-CM | POA: Diagnosis not present

## 2022-01-31 MED ORDER — DENOSUMAB 60 MG/ML ~~LOC~~ SOSY
PREFILLED_SYRINGE | SUBCUTANEOUS | Status: AC
  Filled 2022-01-31: qty 1

## 2022-01-31 MED ORDER — DENOSUMAB 60 MG/ML ~~LOC~~ SOSY
60.0000 mg | PREFILLED_SYRINGE | Freq: Once | SUBCUTANEOUS | Status: AC
  Administered 2022-01-31: 60 mg via SUBCUTANEOUS

## 2022-02-08 ENCOUNTER — Encounter (HOSPITAL_COMMUNITY): Payer: Self-pay

## 2022-02-08 ENCOUNTER — Emergency Department (HOSPITAL_COMMUNITY): Payer: Medicare Other

## 2022-02-08 ENCOUNTER — Other Ambulatory Visit: Payer: Self-pay

## 2022-02-08 ENCOUNTER — Inpatient Hospital Stay (HOSPITAL_COMMUNITY)
Admission: EM | Admit: 2022-02-08 | Discharge: 2022-02-15 | DRG: 558 | Disposition: A | Discharge status: Skilled Nursing Facility | Payer: Medicare Other | Attending: Internal Medicine | Admitting: Internal Medicine

## 2022-02-08 DIAGNOSIS — M6282 Rhabdomyolysis: Secondary | ICD-10-CM | POA: Diagnosis not present

## 2022-02-08 DIAGNOSIS — M545 Low back pain, unspecified: Secondary | ICD-10-CM

## 2022-02-08 DIAGNOSIS — Z7982 Long term (current) use of aspirin: Secondary | ICD-10-CM

## 2022-02-08 DIAGNOSIS — R4589 Other symptoms and signs involving emotional state: Secondary | ICD-10-CM | POA: Diagnosis not present

## 2022-02-08 DIAGNOSIS — Z79899 Other long term (current) drug therapy: Secondary | ICD-10-CM

## 2022-02-08 DIAGNOSIS — R748 Abnormal levels of other serum enzymes: Secondary | ICD-10-CM

## 2022-02-08 DIAGNOSIS — R441 Visual hallucinations: Secondary | ICD-10-CM | POA: Diagnosis present

## 2022-02-08 DIAGNOSIS — F03918 Unspecified dementia, unspecified severity, with other behavioral disturbance: Secondary | ICD-10-CM | POA: Diagnosis present

## 2022-02-08 DIAGNOSIS — Z8249 Family history of ischemic heart disease and other diseases of the circulatory system: Secondary | ICD-10-CM

## 2022-02-08 DIAGNOSIS — W1830XA Fall on same level, unspecified, initial encounter: Secondary | ICD-10-CM | POA: Diagnosis present

## 2022-02-08 DIAGNOSIS — W19XXXA Unspecified fall, initial encounter: Principal | ICD-10-CM | POA: Diagnosis present

## 2022-02-08 DIAGNOSIS — N390 Urinary tract infection, site not specified: Secondary | ICD-10-CM | POA: Diagnosis present

## 2022-02-08 DIAGNOSIS — G4700 Insomnia, unspecified: Secondary | ICD-10-CM | POA: Diagnosis present

## 2022-02-08 DIAGNOSIS — E890 Postprocedural hypothyroidism: Secondary | ICD-10-CM | POA: Diagnosis present

## 2022-02-08 DIAGNOSIS — Z881 Allergy status to other antibiotic agents status: Secondary | ICD-10-CM

## 2022-02-08 DIAGNOSIS — I10 Essential (primary) hypertension: Secondary | ICD-10-CM | POA: Diagnosis present

## 2022-02-08 DIAGNOSIS — Y92013 Bedroom of single-family (private) house as the place of occurrence of the external cause: Secondary | ICD-10-CM

## 2022-02-08 DIAGNOSIS — B9620 Unspecified Escherichia coli [E. coli] as the cause of diseases classified elsewhere: Secondary | ICD-10-CM | POA: Diagnosis present

## 2022-02-08 DIAGNOSIS — Z79890 Hormone replacement therapy (postmenopausal): Secondary | ICD-10-CM

## 2022-02-08 DIAGNOSIS — I5189 Other ill-defined heart diseases: Secondary | ICD-10-CM

## 2022-02-08 DIAGNOSIS — Z85850 Personal history of malignant neoplasm of thyroid: Secondary | ICD-10-CM

## 2022-02-08 DIAGNOSIS — I119 Hypertensive heart disease without heart failure: Secondary | ICD-10-CM | POA: Diagnosis present

## 2022-02-08 DIAGNOSIS — F0393 Unspecified dementia, unspecified severity, with mood disturbance: Secondary | ICD-10-CM | POA: Diagnosis present

## 2022-02-08 DIAGNOSIS — Z20822 Contact with and (suspected) exposure to covid-19: Secondary | ICD-10-CM | POA: Diagnosis present

## 2022-02-08 DIAGNOSIS — R54 Age-related physical debility: Secondary | ICD-10-CM | POA: Diagnosis present

## 2022-02-08 DIAGNOSIS — Z885 Allergy status to narcotic agent status: Secondary | ICD-10-CM

## 2022-02-08 DIAGNOSIS — F0392 Unspecified dementia, unspecified severity, with psychotic disturbance: Secondary | ICD-10-CM | POA: Diagnosis present

## 2022-02-08 DIAGNOSIS — Z8701 Personal history of pneumonia (recurrent): Secondary | ICD-10-CM

## 2022-02-08 DIAGNOSIS — M810 Age-related osteoporosis without current pathological fracture: Secondary | ICD-10-CM | POA: Diagnosis present

## 2022-02-08 DIAGNOSIS — I248 Other forms of acute ischemic heart disease: Secondary | ICD-10-CM | POA: Diagnosis present

## 2022-02-08 DIAGNOSIS — R443 Hallucinations, unspecified: Secondary | ICD-10-CM

## 2022-02-08 DIAGNOSIS — R778 Other specified abnormalities of plasma proteins: Secondary | ICD-10-CM

## 2022-02-08 DIAGNOSIS — R440 Auditory hallucinations: Secondary | ICD-10-CM | POA: Diagnosis present

## 2022-02-08 DIAGNOSIS — R053 Chronic cough: Secondary | ICD-10-CM | POA: Diagnosis present

## 2022-02-08 DIAGNOSIS — Z96642 Presence of left artificial hip joint: Secondary | ICD-10-CM | POA: Diagnosis present

## 2022-02-08 DIAGNOSIS — E785 Hyperlipidemia, unspecified: Secondary | ICD-10-CM | POA: Diagnosis present

## 2022-02-08 DIAGNOSIS — I739 Peripheral vascular disease, unspecified: Secondary | ICD-10-CM | POA: Diagnosis present

## 2022-02-08 DIAGNOSIS — R627 Adult failure to thrive: Secondary | ICD-10-CM

## 2022-02-08 DIAGNOSIS — R296 Repeated falls: Secondary | ICD-10-CM | POA: Diagnosis present

## 2022-02-08 DIAGNOSIS — R7989 Other specified abnormal findings of blood chemistry: Secondary | ICD-10-CM

## 2022-02-08 LAB — COMPREHENSIVE METABOLIC PANEL
ALT: 21 U/L (ref 0–44)
AST: 46 U/L — ABNORMAL HIGH (ref 15–41)
Albumin: 3.6 g/dL (ref 3.5–5.0)
Alkaline Phosphatase: 56 U/L (ref 38–126)
Anion gap: 9 (ref 5–15)
BUN: 20 mg/dL (ref 8–23)
CO2: 23 mmol/L (ref 22–32)
Calcium: 7.8 mg/dL — ABNORMAL LOW (ref 8.9–10.3)
Chloride: 102 mmol/L (ref 98–111)
Creatinine, Ser: 0.54 mg/dL (ref 0.44–1.00)
GFR, Estimated: >60 mL/min (ref >60)
Glucose, Bld: 103 mg/dL — ABNORMAL HIGH (ref 70–99)
Potassium: 3.7 mmol/L (ref 3.5–5.1)
Sodium: 134 mmol/L — ABNORMAL LOW (ref 135–145)
Total Bilirubin: 1 mg/dL (ref 0.3–1.2)
Total Protein: 6.9 g/dL (ref 6.5–8.1)

## 2022-02-08 LAB — RESP PANEL BY RT-PCR (FLU A&B, COVID) ARPGX2
Influenza A by PCR: NEGATIVE
Influenza B by PCR: NEGATIVE
SARS Coronavirus 2 by RT PCR: NEGATIVE

## 2022-02-08 LAB — TYPE AND SCREEN
ABO/RH(D): A POS
Antibody Screen: NEGATIVE

## 2022-02-08 LAB — TROPONIN I (HIGH SENSITIVITY)
Troponin I (High Sensitivity): 636 ng/L (ref <18)
Troponin I (High Sensitivity): 681 ng/L (ref <18)
Troponin I (High Sensitivity): 702 ng/L (ref <18)

## 2022-02-08 LAB — CBC WITH DIFFERENTIAL/PLATELET
Abs Immature Granulocytes: 0.03 10*3/uL (ref 0.00–0.07)
Basophils Absolute: 0 10*3/uL (ref 0.0–0.1)
Basophils Relative: 0 %
Eosinophils Absolute: 0 10*3/uL (ref 0.0–0.5)
Eosinophils Relative: 0 %
HCT: 43.2 % (ref 36.0–46.0)
Hemoglobin: 14.6 g/dL (ref 12.0–15.0)
Immature Granulocytes: 0 %
Lymphocytes Relative: 7 %
Lymphs Abs: 0.7 10*3/uL (ref 0.7–4.0)
MCH: 30.2 pg (ref 26.0–34.0)
MCHC: 33.8 g/dL (ref 30.0–36.0)
MCV: 89.3 fL (ref 80.0–100.0)
Monocytes Absolute: 0.5 10*3/uL (ref 0.1–1.0)
Monocytes Relative: 5 %
Neutro Abs: 7.9 10*3/uL — ABNORMAL HIGH (ref 1.7–7.7)
Neutrophils Relative %: 88 %
Platelets: 266 10*3/uL (ref 150–400)
RBC: 4.84 MIL/uL (ref 3.87–5.11)
RDW: 13.2 % (ref 11.5–15.5)
WBC: 9.1 10*3/uL (ref 4.0–10.5)
nRBC: 0 % (ref 0.0–0.2)

## 2022-02-08 LAB — URINALYSIS, ROUTINE W REFLEX MICROSCOPIC
Bilirubin Urine: NEGATIVE
Glucose, UA: NEGATIVE mg/dL
Ketones, ur: 20 mg/dL — AB
Nitrite: POSITIVE — AB
Protein, ur: 30 mg/dL — AB
Specific Gravity, Urine: 1.014 (ref 1.005–1.030)
pH: 7 (ref 5.0–8.0)

## 2022-02-08 LAB — PROTIME-INR
INR: 1 (ref 0.8–1.2)
Prothrombin Time: 13.6 seconds (ref 11.4–15.2)

## 2022-02-08 LAB — CK: Total CK: 1244 U/L — ABNORMAL HIGH (ref 38–234)

## 2022-02-08 MED ORDER — SODIUM CHLORIDE 0.9 % IV SOLN: Freq: Once | INTRAVENOUS | Status: AC

## 2022-02-08 MED ORDER — LEVOTHYROXINE SODIUM 112 MCG PO TABS
112.0000 ug | ORAL_TABLET | Freq: Every day | ORAL | Status: DC
  Administered 2022-02-09 – 2022-02-10 (×2): 112 ug via ORAL
  Filled 2022-02-08 (×2): qty 1

## 2022-02-08 MED ORDER — POLYETHYLENE GLYCOL 3350 17 G PO PACK: 17.0000 g | PACK | Freq: Every day | ORAL | Status: DC | PRN

## 2022-02-08 MED ORDER — SODIUM CHLORIDE 0.9% FLUSH
3.0000 mL | Freq: Two times a day (BID) | INTRAVENOUS | Status: DC
  Administered 2022-02-09 – 2022-02-13 (×10): 3 mL via INTRAVENOUS

## 2022-02-08 MED ORDER — ACETAMINOPHEN 650 MG RE SUPP
650.0000 mg | Freq: Four times a day (QID) | RECTAL | Status: DC | PRN
Start: 2022-02-08 — End: 2022-02-15

## 2022-02-08 MED ORDER — ENOXAPARIN SODIUM 40 MG/0.4ML IJ SOSY
40.0000 mg | PREFILLED_SYRINGE | INTRAMUSCULAR | Status: DC
  Administered 2022-02-09 – 2022-02-14 (×7): 40 mg via SUBCUTANEOUS
  Filled 2022-02-08 (×7): qty 0.4

## 2022-02-08 MED ORDER — ACETAMINOPHEN 325 MG PO TABS
650.0000 mg | ORAL_TABLET | Freq: Four times a day (QID) | ORAL | Status: DC | PRN
  Administered 2022-02-08: 650 mg via ORAL
  Administered 2022-02-09 (×2): 325 mg via ORAL
  Administered 2022-02-09 – 2022-02-13 (×4): 650 mg via ORAL
  Filled 2022-02-08 (×8): qty 2

## 2022-02-08 MED ORDER — ASPIRIN EC 81 MG PO TBEC
81.0000 mg | DELAYED_RELEASE_TABLET | Freq: Every day | ORAL | Status: DC
  Administered 2022-02-09 – 2022-02-15 (×7): 81 mg via ORAL
  Filled 2022-02-08 (×7): qty 1

## 2022-02-08 NOTE — H&P (Signed)
?History and Physical  ? ?Jackie [name] YQI:[id number] DOB: August 30, 1932 DOA: 02/08/2022 ? ?PCP: [name], MD  ? ?Patient coming from: Home ? ?Chief Complaint: Fall ? ?HPI: Jackie [name] is a 88 y.o. female with medical history significant of the fracture, depression, insomnia, esophageal diverticulum, anemia, BPPV, diastolic dysfunction, hypertension, hyperlipidemia, peripheral vascular disease, hypothyroidism presenting after a fall at home. ? ?Patient is accompanied by his son.  Patient lives by herself at home.  She is little bit unclear on the details of the events.  But she did fall sometime yesterday and was unable to stand afterwards due to pain, but was able to scoot along the floor to get to her bed into her phone.  She called her daughter and later today daughter called EMS.  It appears patient was down for up to around 20 hours.  She reports some low back pain.  Unsure if she lost consciousness at the time of fall. ? ?Reports new hallucinations around the time of the fall and here.  Small people when she was at home and thought she was at a auditory emptying 100s or thousands of people who would not answer her and appeared to be deaf.  Son reports she has been seeing her daughter when her daughter was not there while in the ED as well. ? ?She denies fevers, chills, chest pain, shortness of breath, abdominal pain, constipation, diarrhea, nausea, vomiting. ? ?ED Course: Vital signs in the ED significant for blood pressure in the 387F to 643 systolic.  Lab work-up included CMP with sodium 134, glucose 103, calcium 7.8, AST mildly elevated 46.  CBC within normal limits.  PT and INR within normal limits.  Troponin mildly elevated 636 with repeat pending.  CK elevated to 1244.  Rest were panel flu and COVID-negative.  Urinalysis pending.  Patient was typed and screened.  Imaging showed chest x-ray with stable chronic interstitial opacities, L-spine x-ray with no acute abnormality, pelvis x-ray with no  acute abnormality.  CT head without acute abnormality but did show chronic changes, CT C-spine also showed no acute abnormality but showed chronic degenerative changes.  Patient started on IV fluids at 125 cc an hour in ED.  Cardiology was consulted, Dr. Nicolette Bang, stated just to watch troponin for now does not appear to currently need cardiology intervention and to reconsult as needed. ? ?Review of Systems: As per HPI otherwise all other systems reviewed and are negative. ? ?Past Medical History:  ?Diagnosis Date  ? Cancer ([place]) 2000  ? thyroid cancer  ? Osteoporosis   ? Thyroid disease   ? Urgency of urination   ? ? ?Past Surgical History:  ?Procedure Laterality Date  ? APPENDECTOMY  1997  ? HIP ARTHROPLASTY Left 10/12/2014  ? Procedure: ARTHROPLASTY BIPOLAR HIP;  Surgeon: Marybelle Killings, MD;  Location: [facility];  Service: Orthopedics;  Laterality: Left;  ? IR RADIOLOGIST EVAL & MGMT  10/01/2020  ? TOTAL THYROIDECTOMY  2000  ? ? ?Social History ? reports that she has never smoked. She has never used smokeless tobacco. She reports that she does not drink alcohol and does not use drugs. ? ?Allergies  ?Allergen Reactions  ? Codeine Camsylate [Codeine] Other (See Comments)  ?  Caused severe headaches, dizziness and confusion  ? Zithromax [Azithromycin] Nausea And Vomiting  ? Erythromycin Base   ? ?Family History  ?Problem Relation Age of Onset  ? Hypertension Mother   ? Heart disease Father   ?Reviewed on admission ? ?Prior  to Admission medications   ?Medication Sig Start Date End Date Taking? Authorizing Provider  ?acetaminophen (TYLENOL) 325 MG tablet Take 2 tablets (650 mg total) by mouth every 6 (six) hours as needed for mild pain, moderate pain, fever or headache. 10/15/14   Hongalgi, Lenis Dickinson, MD  ?amLODipine (NORVASC) 2.5 MG tablet Take 1.25 mg by mouth daily. 10/16/19   [provider]  ?aspirin 81 MG tablet Take 81 mg by mouth daily.    [provider]  ?bisacodyl (DULCOLAX) 10 MG suppository Place 1  suppository (10 mg total) rectally daily as needed for moderate constipation. 10/15/14   Hongalgi, Lenis Dickinson, MD  ?Cholecalciferol (VITAMIN D-3 PO) Take 1 tablet by mouth daily.    [provider]  ?clotrimazole-betamethasone (LOTRISONE) cream Apply 1 application topically 2 (two) times daily. 03/22/21   Gardiner Barefoot, DPM  ?gabapentin (NEURONTIN) 100 MG capsule gabapentin 100 mg capsule ? TK 1 C PO QHS PRF PAIN    [provider]  ?ibuprofen (ADVIL,MOTRIN) 200 MG tablet Take 1 tablet (200 mg total) by mouth every 6 (six) hours as needed for moderate pain (for pain not controlled with tylenol.). 10/15/14   Hongalgi, Lenis Dickinson, MD  ?ketoconazole (NIZORAL) 2 % cream Apply 1 fingertip amount to affected area right foot daily. 09/25/20   Evelina Bucy, DPM  ?loratadine (CLARITIN) 10 MG tablet Take 10 mg by mouth daily. Takes '5mg'$  daily    [provider]  ?Multiple Minerals-Vitamins (CALCIUM & VIT D3 BONE HEALTH PO) Take 1 tablet by mouth daily.    [provider]  ?Multiple Vitamins-Minerals (MULTIVITAMIN WITH MINERALS) tablet Take 1 tablet by mouth daily.    [provider]  ?polyethylene glycol (MIRALAX / GLYCOLAX) packet Take 17 g by mouth daily as needed for mild constipation. ?Patient taking differently: Take 17 g by mouth daily. 10/15/14   Hongalgi, Lenis Dickinson, MD  ?prednisoLONE acetate (PRED FORTE) 1 % ophthalmic suspension prednisolone acetate 1 % eye drops,suspension ? INSTILL 1 DROP IN AFFECTED EYE(S) FOUR TIMES DAILY FOR 7 DAYS AFTER SURGERY    [provider]  ?rosuvastatin (CRESTOR) 10 MG tablet Take 10 mg by mouth daily.     [provider]  ?SYNTHROID 112 MCG tablet Take 112 mcg by mouth daily. 11/12/19   [provider]  ?triamcinolone (KENALOG) 0.1 % SMARTSIG:Sparingly Topical 1 to 3 Times Daily PRN 06/19/20   [provider]  ?vitamin B-12 (CYANOCOBALAMIN) 1000 MCG tablet Take 1,000 mcg by mouth daily.    [provider]  ?vitamin E 1000 UNIT capsule Take 1,000 Units by mouth daily.    [provider]  ? ? ?Physical Exam: ?Vitals:  ? 02/08/22 1745 02/08/22 1800 02/08/22 1815 02/08/22 1830  ?BP: (!) 157/74 (!) 160/75 (!) 143/64 130/70  ?Pulse: 73 68 80 80  ?Resp: '17 18 16 16  '$ ?Temp:      ?TempSrc:      ?SpO2: 96% 95% 100% 98%  ?Weight:      ?Height:      ? ? ?Physical Exam ?Constitutional:   ?   General: She is not in acute distress. ?   Appearance: Normal appearance.  ?HENT:  ?   Head: Normocephalic and atraumatic.  ?   Mouth/Throat:  ?   Mouth: Mucous membranes are moist.  ?   Pharynx: Oropharynx is clear.  ?Eyes:  ?   Extraocular Movements: Extraocular movements intact.  ?   Pupils: Pupils are equal, round, and reactive to  light.  ?Cardiovascular:  ?   Rate and Rhythm: Normal rate and regular rhythm.  ?   Pulses: Normal pulses.  ?   Heart sounds: Normal heart sounds.  ?Pulmonary:  ?   Effort: Pulmonary effort is normal. No respiratory distress.  ?   Breath sounds: Normal breath sounds.  ?Abdominal:  ?   General: Bowel sounds are normal. There is no distension.  ?   Palpations: Abdomen is soft.  ?   Tenderness: There is no abdominal tenderness.  ?Musculoskeletal:     ?   General: No swelling or deformity.  ?Skin: ?   General: Skin is warm and dry.  ?Neurological:  ?   General: No focal deficit present.  ?   Comments: Decreased memory about the events of the fall.  ?Psychiatric:  ?   Comments: Hallucinations  ? ?Labs on Admission: I have personally reviewed following labs and imaging studies ? ?CBC: ?Recent Labs  ?Lab 02/08/22 ?1602  ?WBC 9.1  ?NEUTROABS 7.9*  ?HGB 14.6  ?HCT 43.2  ?MCV 89.3  ?PLT 266  ? ? ?Basic Metabolic Panel: ?Recent Labs  ?Lab 02/08/22 ?1602  ?NA 134*  ?K 3.7  ?CL 102  ?CO2 23  ?GLUCOSE 103*  ?BUN 20  ?CREATININE 0.54  ?CALCIUM 7.8*  ? ? ?GFR: ?Estimated Creatinine Clearance: 40.9 mL/min (by C-G formula based on SCr of 0.54 mg/dL). ? ?Liver Function Tests: ?Recent Labs  ?Lab 02/08/22 ?1602  ?AST 46*   ?ALT 21  ?ALKPHOS 56  ?BILITOT 1.0  ?PROT 6.9  ?ALBUMIN 3.6  ? ? ?Urine analysis: ?   ?Component Value Date/Time  ? [place] YELLOW 02/08/2022 1910  ? APPEARANCEUR HAZY (A) 02/08/2022 1910  ? LABSPEC

## 2022-02-08 NOTE — ED Provider Notes (Signed)
?[facility] DEPT ?Provider Note ? ? ?CSN: [id number] ?Arrival date & time: 02/08/22  1518 ? ?  ? ?History ? ?Chief Complaint  ?Patient presents with  ? Fall  ? ? ?Jackie [name] is a 88 y.o. female. ? ?88 year old female with prior medical history as detailed below presents for evaluation.  Patient apparently lives at home by herself.  Patient is unclear with many details of her reported fall.  She reports that she fell sometime yesterday.  She was unable to stand afterwards.  She was able to scoot across the floor to her bed.  She was able to call her daughter who lives in [place] who apparently then called EMS today.  She is currently complaining of low back pain.  She cannot recall the exact details of what led her to fall yesterday.  She denies chest pain or shortness of breath.  She cannot deny losing consciousness. ? ?The history is provided by medical records and the patient.  ?Fall ?This is a new problem. The current episode started yesterday. The problem occurs rarely. The problem has not changed since onset.Pertinent negatives include no chest pain and no abdominal pain. Nothing aggravates the symptoms. Nothing relieves the symptoms.  ? ?  ? ?Home Medications ?Prior to Admission medications   ?Medication Sig Start Date End Date Taking? Authorizing Provider  ?acetaminophen (TYLENOL) 325 MG tablet Take 2 tablets (650 mg total) by mouth every 6 (six) hours as needed for mild pain, moderate pain, fever or headache. 10/15/14   Hongalgi, Lenis Dickinson, MD  ?amLODipine (NORVASC) 2.5 MG tablet Take 1.25 mg by mouth daily. 10/16/19   [provider]  ?aspirin 81 MG tablet Take 81 mg by mouth daily.    [provider]  ?bisacodyl (DULCOLAX) 10 MG suppository Place 1 suppository (10 mg total) rectally daily as needed for moderate constipation. 10/15/14   Hongalgi, Lenis Dickinson, MD  ?Cholecalciferol (VITAMIN D-3 PO) Take 1 tablet by mouth daily.    [provider]   ?ciprofloxacin (CIPRO) 500 MG tablet ciprofloxacin 500 mg tablet ? TK 1 T PO BID    [provider]  ?clindamycin (CLEOCIN) 300 MG capsule Take 1 capsule (300 mg total) by mouth 2 (two) times daily. 09/25/20   Evelina Bucy, DPM  ?clotrimazole-betamethasone (LOTRISONE) cream Apply 1 application topically 2 (two) times daily. 03/22/21   Gardiner Barefoot, DPM  ?doxycycline (VIBRA-TABS) 100 MG tablet Take 100 mg by mouth 2 (two) times daily. 06/16/20   [provider]  ?gabapentin (NEURONTIN) 100 MG capsule gabapentin 100 mg capsule ? TK 1 C PO QHS PRF PAIN    [provider]  ?ibuprofen (ADVIL,MOTRIN) 200 MG tablet Take 1 tablet (200 mg total) by mouth every 6 (six) hours as needed for moderate pain (for pain not controlled with tylenol.). 10/15/14   Hongalgi, Lenis Dickinson, MD  ?ketoconazole (NIZORAL) 2 % cream Apply 1 fingertip amount to affected area right foot daily. 09/25/20   Evelina Bucy, DPM  ?loratadine (CLARITIN) 10 MG tablet Take 10 mg by mouth daily. Takes '5mg'$  daily    [provider]  ?metroNIDAZOLE (FLAGYL) 250 MG tablet Take 250 mg by mouth 3 (three) times daily. 10/08/20   [provider]  ?Multiple Minerals-Vitamins (CALCIUM & VIT D3 BONE HEALTH PO) Take 1 tablet by mouth daily.    [provider]  ?Multiple Vitamins-Minerals (MULTIVITAMIN WITH MINERALS) tablet Take 1 tablet by mouth daily.    [provider]  ?nitrofurantoin,  macrocrystal-monohydrate, (MACROBID) 100 MG capsule TAKE 1 CAPSULE BY MOUTH FOR 7 DAYS 11/04/19   [provider]  ?polyethylene glycol (MIRALAX / GLYCOLAX) packet Take 17 g by mouth daily as needed for mild constipation. ?Patient taking differently: Take 17 g by mouth daily. 10/15/14   Hongalgi, Lenis Dickinson, MD  ?prednisoLONE acetate (PRED FORTE) 1 % ophthalmic suspension prednisolone acetate 1 % eye drops,suspension ? INSTILL 1 DROP IN AFFECTED EYE(S) FOUR TIMES DAILY FOR 7 DAYS AFTER SURGERY    [provider]  ?rosuvastatin (CRESTOR) 10 MG tablet Take 10 mg by mouth daily.     [provider]  ?sulfamethoxazole-trimethoprim (BACTRIM DS) 800-160 MG tablet Take 1 tablet by mouth 2 (two) times daily. 08/05/20   [provider]  ?SYNTHROID 112 MCG tablet Take 112 mcg by mouth daily. 11/12/19   [provider]  ?triamcinolone (KENALOG) 0.1 % SMARTSIG:Sparingly Topical 1 to 3 Times Daily PRN 06/19/20   [provider]  ?vitamin B-12 (CYANOCOBALAMIN) 1000 MCG tablet Take 1,000 mcg by mouth daily.    [provider]  ?vitamin E 1000 UNIT capsule Take 1,000 Units by mouth daily.    [provider]  ?   ? ?Allergies    ?Codeine camsylate [codeine], Zithromax [azithromycin], and Erythromycin base   ? ?Review of Systems   ?Review of Systems  ?Cardiovascular:  Negative for chest pain.  ?Gastrointestinal:  Negative for abdominal pain.  ?All other systems reviewed and are negative. ? ?Physical Exam ?Updated Vital Signs ?BP (!) 148/86   Pulse 80   Resp (!) 23   Ht '5\' 1"'$  (1.549 m)   Wt 59 kg   SpO2 98%   BMI 24.58 kg/m?  ?Physical Exam ?Vitals and nursing note reviewed.  ?Constitutional:   ?   General: She is not in acute distress. ?   Appearance: Normal appearance. She is well-developed.  ?HENT:  ?   Head: Normocephalic and atraumatic.  ?Eyes:  ?   Conjunctiva/sclera: Conjunctivae normal.  ?   Pupils: Pupils are equal, round, and reactive to light.  ?Cardiovascular:  ?   Rate and Rhythm: Normal rate and regular rhythm.  ?   Heart sounds: Normal heart sounds.  ?Pulmonary:  ?   Effort: Pulmonary effort is normal. No respiratory distress.  ?   Breath sounds: Normal breath sounds.  ?Abdominal:  ?   General: There is no distension.  ?   Palpations: Abdomen is soft.  ?   Tenderness: There is no abdominal tenderness.  ?Musculoskeletal:     ?   General: No deformity. Normal range of motion.  ?   Cervical back: Normal range of motion and neck supple.  ?   Comments: Mild  diffuse pain across the low lumbar back.  No specific midline tenderness. ? ?Both lower extremities with 5 out of 5 strength.  ?Skin: ?   General: Skin is warm and dry.  ?Neurological:  ?   General: No focal deficit present.  ?   Mental Status: She is alert and oriented to person, place, and time.  ? ? ?ED Results / Procedures / Treatments   ?Labs ?(all labs ordered are listed, but only abnormal results are displayed) ?Labs Reviewed  ?RESP PANEL BY RT-PCR (FLU A&B, COVID) [id number]  ?URINALYSIS, ROUTINE W REFLEX MICROSCOPIC  ?COMPREHENSIVE METABOLIC PANEL  ?CBC WITH DIFFERENTIAL/PLATELET  ?PROTIME-INR  ?CK  ?TYPE AND SCREEN  ?TROPONIN I (HIGH SENSITIVITY)  ? ? ?EKG ?None ? ?Radiology ?No results found. ? ?Procedures ?  Procedures  ? ? ?Medications Ordered in ED ?Medications - No data to display ? ?ED Course/ Medical Decision Making/ A&P ?  ?                        ?Medical Decision Making ?Amount and/or Complexity of Data Reviewed ?Labs: ordered. ?Radiology: ordered. ? ?Risk ?Prescription drug management. ? ? ? ?Medical Screen Complete ? ?This patient presented to the ED with complaint of unwitnessed fall, possible syncope, inability to walk after fall. ? ?This complaint involves an extensive number of treatment options. The initial differential diagnosis includes, but is not limited to, dehydration, hemolysis, ACS, traumatic injury secondary to fall, etc. ? ?This presentation is: Acute, Chronic, Self-Limited, Previously Undiagnosed, Uncertain Prognosis, Complicated, Systemic Symptoms, and Threat to Life/Bodily Function ? ?Patient is presenting after apparent fall.  Patient lives at home by herself.  She apparently fell yesterday.  She was on the floor until this afternoon when she called family who called EMS. ? ?Patient cannot recall details of the events leading to her fall. ? ?She has been unable to walk since the fall.  She complains of diffuse lower back pain. ? ?Labs obtained significant for elevated CK and  troponin.  Patient without reported chest pain. ? ?Imaging obtained is without evidence of significant traumatic injury secondary to fall. ? ?Cardiology -Dr. Einar Gip - is aware of case.  He agrees with plan of care.  He requests th

## 2022-02-08 NOTE — ED Triage Notes (Signed)
Pt. Golden Circle then called her daughter, her daughter then called 9-1-1.  ?EMS found her sitting up against her bed conscious and alert. AAOx4, but doesn't know what caused her to fall.  ?Pt. C/o complains of some back pain to the lower left side.  ?VSS  ? ?

## 2022-02-09 DIAGNOSIS — Z96642 Presence of left artificial hip joint: Secondary | ICD-10-CM | POA: Diagnosis present

## 2022-02-09 DIAGNOSIS — G4700 Insomnia, unspecified: Secondary | ICD-10-CM | POA: Diagnosis present

## 2022-02-09 DIAGNOSIS — R778 Other specified abnormalities of plasma proteins: Secondary | ICD-10-CM | POA: Diagnosis not present

## 2022-02-09 DIAGNOSIS — Z20822 Contact with and (suspected) exposure to covid-19: Secondary | ICD-10-CM | POA: Diagnosis present

## 2022-02-09 DIAGNOSIS — Z7982 Long term (current) use of aspirin: Secondary | ICD-10-CM | POA: Diagnosis not present

## 2022-02-09 DIAGNOSIS — F0392 Unspecified dementia, unspecified severity, with psychotic disturbance: Secondary | ICD-10-CM | POA: Diagnosis present

## 2022-02-09 DIAGNOSIS — Z79890 Hormone replacement therapy (postmenopausal): Secondary | ICD-10-CM | POA: Diagnosis not present

## 2022-02-09 DIAGNOSIS — F03918 Unspecified dementia, unspecified severity, with other behavioral disturbance: Secondary | ICD-10-CM | POA: Diagnosis present

## 2022-02-09 DIAGNOSIS — T796XXA Traumatic ischemia of muscle, initial encounter: Secondary | ICD-10-CM | POA: Diagnosis not present

## 2022-02-09 DIAGNOSIS — R627 Adult failure to thrive: Secondary | ICD-10-CM | POA: Diagnosis not present

## 2022-02-09 DIAGNOSIS — W19XXXA Unspecified fall, initial encounter: Secondary | ICD-10-CM | POA: Diagnosis present

## 2022-02-09 DIAGNOSIS — N390 Urinary tract infection, site not specified: Secondary | ICD-10-CM | POA: Diagnosis present

## 2022-02-09 DIAGNOSIS — B9620 Unspecified Escherichia coli [E. coli] as the cause of diseases classified elsewhere: Secondary | ICD-10-CM | POA: Diagnosis present

## 2022-02-09 DIAGNOSIS — I739 Peripheral vascular disease, unspecified: Secondary | ICD-10-CM | POA: Diagnosis present

## 2022-02-09 DIAGNOSIS — T796XXD Traumatic ischemia of muscle, subsequent encounter: Secondary | ICD-10-CM | POA: Diagnosis not present

## 2022-02-09 DIAGNOSIS — M6282 Rhabdomyolysis: Secondary | ICD-10-CM | POA: Diagnosis present

## 2022-02-09 DIAGNOSIS — W1830XA Fall on same level, unspecified, initial encounter: Secondary | ICD-10-CM | POA: Diagnosis present

## 2022-02-09 DIAGNOSIS — R7989 Other specified abnormal findings of blood chemistry: Secondary | ICD-10-CM | POA: Diagnosis not present

## 2022-02-09 DIAGNOSIS — R441 Visual hallucinations: Secondary | ICD-10-CM | POA: Diagnosis present

## 2022-02-09 DIAGNOSIS — R4589 Other symptoms and signs involving emotional state: Secondary | ICD-10-CM | POA: Diagnosis not present

## 2022-02-09 DIAGNOSIS — Z79899 Other long term (current) drug therapy: Secondary | ICD-10-CM | POA: Diagnosis not present

## 2022-02-09 DIAGNOSIS — R053 Chronic cough: Secondary | ICD-10-CM | POA: Diagnosis present

## 2022-02-09 DIAGNOSIS — R440 Auditory hallucinations: Secondary | ICD-10-CM | POA: Diagnosis present

## 2022-02-09 DIAGNOSIS — E785 Hyperlipidemia, unspecified: Secondary | ICD-10-CM | POA: Diagnosis present

## 2022-02-09 DIAGNOSIS — R296 Repeated falls: Secondary | ICD-10-CM | POA: Diagnosis present

## 2022-02-09 DIAGNOSIS — I119 Hypertensive heart disease without heart failure: Secondary | ICD-10-CM | POA: Diagnosis present

## 2022-02-09 DIAGNOSIS — W19XXXD Unspecified fall, subsequent encounter: Secondary | ICD-10-CM | POA: Diagnosis not present

## 2022-02-09 DIAGNOSIS — Y92013 Bedroom of single-family (private) house as the place of occurrence of the external cause: Secondary | ICD-10-CM | POA: Diagnosis not present

## 2022-02-09 DIAGNOSIS — E890 Postprocedural hypothyroidism: Secondary | ICD-10-CM | POA: Diagnosis present

## 2022-02-09 DIAGNOSIS — R443 Hallucinations, unspecified: Secondary | ICD-10-CM | POA: Diagnosis not present

## 2022-02-09 DIAGNOSIS — Z885 Allergy status to narcotic agent status: Secondary | ICD-10-CM | POA: Diagnosis not present

## 2022-02-09 DIAGNOSIS — M810 Age-related osteoporosis without current pathological fracture: Secondary | ICD-10-CM | POA: Diagnosis present

## 2022-02-09 DIAGNOSIS — I248 Other forms of acute ischemic heart disease: Secondary | ICD-10-CM | POA: Diagnosis present

## 2022-02-09 DIAGNOSIS — R54 Age-related physical debility: Secondary | ICD-10-CM | POA: Diagnosis present

## 2022-02-09 DIAGNOSIS — F0393 Unspecified dementia, unspecified severity, with mood disturbance: Secondary | ICD-10-CM | POA: Diagnosis present

## 2022-02-09 DIAGNOSIS — Z881 Allergy status to other antibiotic agents status: Secondary | ICD-10-CM | POA: Diagnosis not present

## 2022-02-09 LAB — COMPREHENSIVE METABOLIC PANEL
ALT: 25 U/L (ref 0–44)
AST: 58 U/L — ABNORMAL HIGH (ref 15–41)
Albumin: 3.2 g/dL — ABNORMAL LOW (ref 3.5–5.0)
Alkaline Phosphatase: 52 U/L (ref 38–126)
Anion gap: 8 (ref 5–15)
BUN: 18 mg/dL (ref 8–23)
CO2: 22 mmol/L (ref 22–32)
Calcium: 8.2 mg/dL — ABNORMAL LOW (ref 8.9–10.3)
Chloride: 103 mmol/L (ref 98–111)
Creatinine, Ser: 0.66 mg/dL (ref 0.44–1.00)
GFR, Estimated: >60 mL/min (ref >60)
Glucose, Bld: 90 mg/dL (ref 70–99)
Potassium: 3.6 mmol/L (ref 3.5–5.1)
Sodium: 133 mmol/L — ABNORMAL LOW (ref 135–145)
Total Bilirubin: 1.1 mg/dL (ref 0.3–1.2)
Total Protein: 6.3 g/dL — ABNORMAL LOW (ref 6.5–8.1)

## 2022-02-09 LAB — VITAMIN B12: Vitamin B-12: 291 pg/mL (ref 180–914)

## 2022-02-09 LAB — CBC
HCT: 39.2 % (ref 36.0–46.0)
Hemoglobin: 13.3 g/dL (ref 12.0–15.0)
MCH: 30.1 pg (ref 26.0–34.0)
MCHC: 33.9 g/dL (ref 30.0–36.0)
MCV: 88.7 fL (ref 80.0–100.0)
Platelets: 243 10*3/uL (ref 150–400)
RBC: 4.42 MIL/uL (ref 3.87–5.11)
RDW: 13.2 % (ref 11.5–15.5)
WBC: 7 10*3/uL (ref 4.0–10.5)
nRBC: 0 % (ref 0.0–0.2)

## 2022-02-09 LAB — TROPONIN I (HIGH SENSITIVITY)
Troponin I (High Sensitivity): 579 ng/L (ref <18)
Troponin I (High Sensitivity): 543 ng/L (ref <18)

## 2022-02-09 LAB — TSH: TSH: 0.186 u[IU]/mL — ABNORMAL LOW (ref 0.350–4.500)

## 2022-02-09 LAB — CK: Total CK: 2125 U/L — ABNORMAL HIGH (ref 38–234)

## 2022-02-09 MED ORDER — SODIUM CHLORIDE 0.9 % IV SOLN
INTRAVENOUS | Status: AC
Start: 2022-02-09 — End: 2022-02-10

## 2022-02-09 MED ORDER — SODIUM CHLORIDE 0.9 % IV SOLN
1.0000 g | INTRAVENOUS | Status: DC
  Administered 2022-02-09 – 2022-02-11 (×3): 1 g via INTRAVENOUS
  Filled 2022-02-09 (×4): qty 10

## 2022-02-09 NOTE — Consult Note (Addendum)
[facility] Face-to-Face Psychiatry Consult  ? ?Reason for Consult: Hallucinations ?Referring Physician:  Dr. Erlinda Hong ?Patient Identification: Jackie [name] ?MRN:  [id number] ?Principal Diagnosis: Fall ?Diagnosis:  Principal Problem: ?  Fall ?Active Problems: ?  Depressed mood ?  Insomnia ?  Diastolic dysfunction ?  Essential hypertension ?  Hyperlipidemia ?  Peripheral vascular disease ([place]) ?  Postoperative hypothyroidism ?  Rhabdomyolysis ?  Hallucination ?  Falls ? ? ?Total Time spent with patient: 1 hour ? ?Subjective:   ?Jackie [name] is a 88 y.o. female patient admitted with a fall at home.  Is accompanied by her son, and lives at home by herself.  Patient reports worsening hallucinations x3 weeks. ? ?On today's assessment patient is alert and oriented, calm and cooperative, very pleasant throughout psychiatric evaluation.  Her son Francene Finders is at the bedside, consent is obtained to conduct psychiatric evaluation while present.  Patient admits to auditory and visual hallucinations times a few weeks, that have increased.  Patient admits to seeing a group of 100-200 individuals dressed in slacks, looks like me and you.  She admits to seeing these people when she fell, also since being in the hospital she has seen them twice.  She does express frustration, as the group of people have not offered her any assistance.  Her son states she has been showing signs of increased forgetfulness and disorientation with other things in the past.  He uses an example as waking up after a nap not knowing it is the same day.  Patient does admit to worsening depression symptoms, which she identifies as worthless, hopeless, sadness, tearfulness, and weight loss, and disrupted sleep.  She states her depression has been contributed to cannot drive anymore, therefore she is unable to take care of herself as she used to.  She states she has not cooked a meal and weeks, which is likely why she lost weight.  Outside of the hallucinations and  depression symptoms, she denies any other acute psychiatric symptoms and or concerns at this time.  She further denies suicidal ideations and homicidal ideations, citing that life is too valuable to take it yourself.  She denies any previous psychiatric history.  Denies any family history of psychiatric illness.  She denies any substance abuse. ? ?On today's assessment she identifies her mood as okay, despite her recent lifestyle changes i.e. no longer being able to drive and prepare meals.  Per nursing notes patient appears to have increasing disorientation overnight, and is difficult to redirect at times.  On evaluation she does endorse ongoing auditory and visual hallucinations, although she does not appear to be displaying any acute psychosis, thought control or thought insertion.  She denies any suicidal ideations, homicidal ideations.  She does state that she will be interested in medication to help stop the people.  However her son states he would like to get family consensus on the next plan.  He is able to verbalize understanding, noting that his wife is a psychotherapeutic nurse and patient does have a history of decompensating when admitted to skilled nursing rehabilitation.  He is open to recommendations to include outpatient neurology referral and medication which has been listed below.  In the event family consent is obtained to start medication, please see recommendations below.  At this time patient will remain psychiatrically cleared. ? ?HPI:  Jackie [name] is a 88 y.o. female with medical history significant of the fracture, depression, insomnia, esophageal diverticulum, anemia, BPPV, diastolic dysfunction, hypertension, hyperlipidemia, peripheral vascular  disease, hypothyroidism presenting after a fall at home. ? ?Patient is accompanied by his son.  Patient lives by herself at home.  She is little bit unclear on the details of the events.  But she did fall sometime yesterday and was unable to  stand afterwards due to pain, but was able to scoot along the floor to get to her bed into her phone.  She called her daughter and later today daughter called EMS.  It appears patient was down for up to around 20 hours.  She reports some low back pain.  Unsure if she lost consciousness at the time of fall. ?  ?Reports new hallucinations around the time of the fall and here.  Small people when she was at home and thought she was at a auditory emptying 100s or thousands of people who would not answer her and appeared to be deaf.  Son reports she has been seeing her daughter when her daughter was not there while in the ED as well. ? ?Past Psychiatric History: Denies ? ?Risk to Self: Denies ?Risk to Others: Denies ?Prior Inpatient Therapy: Denies ?Prior Outpatient Therapy: Denies ? ?Past Medical History:  ?Past Medical History:  ?Diagnosis Date  ? Cancer ([place]) 2000  ? thyroid cancer  ? Osteoporosis   ? Thyroid disease   ? Urgency of urination   ?  ?Past Surgical History:  ?Procedure Laterality Date  ? APPENDECTOMY  1997  ? HIP ARTHROPLASTY Left 10/12/2014  ? Procedure: ARTHROPLASTY BIPOLAR HIP;  Surgeon: Marybelle Killings, MD;  Location: [facility];  Service: Orthopedics;  Laterality: Left;  ? IR RADIOLOGIST EVAL & MGMT  10/01/2020  ? TOTAL THYROIDECTOMY  2000  ? ?Family History:  ?Family History  ?Problem Relation Age of Onset  ? Hypertension Mother   ? Heart disease Father   ? ?Family Psychiatric  History: Denies as per patient and her son ?Social History:  ?Social History  ? ?Substance and Sexual Activity  ?Alcohol Use No  ?   ?Social History  ? ?Substance and Sexual Activity  ?Drug Use No  ?  ?Social History  ? ?Socioeconomic History  ? Marital status: Widowed  ?  Spouse name: Not on file  ? Number of children: Not on file  ? Years of education: Not on file  ? Highest education level: Not on file  ?Occupational History  ? Not on file  ?Tobacco Use  ? Smoking status: Never  ? Smokeless tobacco: Never  ?Vaping Use  ? Vaping Use:  Never used  ?Substance and Sexual Activity  ? Alcohol use: No  ? Drug use: No  ? Sexual activity: Not Currently  ?Other Topics Concern  ? Not on file  ?Social History Narrative  ? Not on file  ? ?Social Determinants of Health  ? ?Financial Resource Strain: Not on file  ?Food Insecurity: Not on file  ?Transportation Needs: Not on file  ?Physical Activity: Not on file  ?Stress: Not on file  ?Social Connections: Not on file  ? ?Additional Social History: ?  ? ?Allergies:   ?Allergies  ?Allergen Reactions  ? Codeine Camsylate [Codeine] Other (See Comments)  ?  Caused severe headaches, dizziness and confusion  ? Zithromax [Azithromycin] Nausea And Vomiting  ? Erythromycin Base   ? ? ?Labs:  ?Results for orders placed or performed during the hospital encounter of 02/08/22 (from the past 48 hour(s))  ?Resp Panel by RT-PCR (Flu A&B, Covid) Nasopharyngeal Swab     Status: None  ? Collection  Time: 02/08/22  3:33 PM  ? Specimen: Nasopharyngeal Swab; Nasopharyngeal(NP) swabs in vial transport medium  ?Result Value Ref Range  ? SARS Coronavirus 2 by RT PCR NEGATIVE NEGATIVE  ?  Comment: (NOTE) ?SARS-CoV-2 target nucleic acids are NOT DETECTED. ? ?The SARS-CoV-2 RNA is generally detectable in upper respiratory ?specimens during the acute phase of infection. The lowest ?concentration of SARS-CoV-2 viral copies this assay can detect is ?138 copies/mL. A negative result does not preclude SARS-Cov-2 ?infection and should not be used as the sole basis for treatment or ?other patient management decisions. A negative result may occur with  ?improper specimen collection/handling, submission of specimen other ?than nasopharyngeal swab, presence of viral mutation(s) within the ?areas targeted by this assay, and inadequate number of viral ?copies(<138 copies/mL). A negative result must be combined with ?clinical observations, patient history, and epidemiological ?information. The expected result is Negative. ? ?Fact Sheet for Patients:   ?EntrepreneurPulse.com.au ? ?Fact Sheet for Healthcare Providers:  ?IncredibleEmployment.be ? ?This test is no t yet approved or cleared by the [place] FDA and  ?h

## 2022-02-09 NOTE — TOC Initial Note (Signed)
Transition of Care (TOC) - Initial/Assessment Note  ? ? ?Patient Details  ?Name: Jackie [name] ?MRN: [id number] ?Date of Birth: Oct 01, 1932 ? ?Transition of Care (TOC) CM/SW Contact:    ?Tawanna Cooler, RN ?Phone Number: ?02/09/2022, 10:08 AM ? ?Clinical Narrative:                 ? ?Patient lives home alone.  Pending PT eval for possible discharge needs.  ? ? ?Expected Discharge Plan: Home/Self Care ?Barriers to Discharge: Continued Medical Work up ? ? ?Expected Discharge Plan and Services ?Expected Discharge Plan: Home/Self Care ?  ?  ? ?Prior Living Arrangements/Services ?  ?Lives with:: Self ?Patient language and need for interpreter reviewed:: Yes ?       ?Need for Family Participation in Patient Care: Yes (Comment) ?Care giver support system in place?: Yes (comment) ?  ?Criminal Activity/Legal Involvement Pertinent to Current Situation/Hospitalization: No - Comment as needed ? ?Activities of Daily Living ?Home Assistive Devices/Equipment: None ?ADL Screening (condition at time of admission) ?Patient's cognitive ability adequate to safely complete daily activities?: Yes ?Is the patient deaf or have difficulty hearing?: No ?Does the patient have difficulty seeing, even when wearing glasses/contacts?: No ?Does the patient have difficulty concentrating, remembering, or making decisions?: No ?Patient able to express need for assistance with ADLs?: Yes ?Does the patient have difficulty dressing or bathing?: Yes ?Independently performs ADLs?: No ?Communication: Independent ?Dressing (OT): Independent ?Grooming: Independent ?Feeding: Independent ?Bathing: Needs assistance ?Is this a change from baseline?: Pre-admission baseline ?Toileting: Needs assistance ?Is this a change from baseline?: Pre-admission baseline ?In/Out Bed: Needs assistance ?Is this a change from baseline?: Pre-admission baseline ?Walks in Home: Independent ?Does the patient have difficulty walking or climbing stairs?: No ?Weakness of Legs:  Both ?Weakness of Arms/Hands: Both ? ?  ?  ?Alcohol / Substance Use: Not Applicable ?Psych Involvement: No (comment) ? ?Admission diagnosis:  Fall [W19.XXXA] ?Elevated troponin [R77.8] ?Elevated CK [R74.8] ?Fall, initial encounter [W19.XXXA] ?Low back pain without sciatica, unspecified back pain laterality, unspecified chronicity [M54.50] ?Patient Active Problem List  ? Diagnosis Date Noted  ? Diastolic dysfunction 01/08/1734  ? Peripheral vascular disease ([place]) 02/08/2022  ? Fall 02/08/2022  ? Rhabdomyolysis 02/08/2022  ? Hallucination 02/08/2022  ? Dermatitis 03/22/2021  ? Local infection of skin and subcutaneous tissue 09/25/2020  ? Pain due to onychomycosis of toenails of both feet 03/10/2020  ? Essential hypertension 07/03/2018  ? Benign paroxysmal positional vertigo 07/15/2015  ? Acquired diverticulum of esophagus 06/25/2015  ? Postoperative hypothyroidism 06/25/2015  ? Anemia due to chronic blood loss 11/17/2014  ? CN (constipation) 10/20/2014  ? Depressed mood 10/20/2014  ? Insomnia 10/20/2014  ? Femoral neck fracture ([place]) 10/11/2014  ? Hip fracture ([place]) 10/11/2014  ? Hyperlipidemia 12/30/2009  ? ?PCP:  [name], MD ?Pharmacy:   ?[id number] [address] AT [facility] [address] ?[place] ?[place] [address] ?Phone: [phone] Fax: [phone] ? ? ? ?Readmission Risk Interventions ?No flowsheet data found. ? ? ?

## 2022-02-09 NOTE — TOC Progression Note (Signed)
Transition of Care (TOC) - Progression Note  ? ? ?Patient Details  ?Name: Jackie [name] ?MRN: [id number] ?Date of Birth: 1932/07/25 ? ?Transition of Care (TOC) CM/SW Contact  ?Tawanna Cooler, RN ?Phone Number: ?02/09/2022, 1:08 PM ? ?Clinical Narrative:    ? ?PT recommends home health.  TOC CM spoke with patient and her son, [name], at the bedside.  Patient lives alone, has a rolling walker, and they do private pay for [employer].  Explained that with home health the physical therapist will come into the home and work with patient, and that insurance will pay for it at least for a period of time.  Patient and son both state that they do not have any preference for a home health provider.   ?TOC will search for an accepting home health agency.   ? ? ?Expected Discharge Plan: Home/Self Care ?Barriers to Discharge: Continued Medical Work up ? ?Expected Discharge Plan and Services ?Expected Discharge Plan: Home/Self Care ?  ?  ?

## 2022-02-09 NOTE — Progress Notes (Signed)
[facility] [employer] [facility]) Hospital Liaison note: ? ?Notified by Dr Florencia Reasons via Epic workque and [place] notified of request for [place] services.  ? ?Will continue to follow for disposition. ? ?Please call with any outpatient palliative questions or concerns. ? ?Thank you for the opportunity to participate in this patient's care. ? ?Thank you, ?Lorelee Market, LPN ?[facility] Hospital Liaison ?[phone] ?

## 2022-02-09 NOTE — Evaluation (Signed)
Occupational Therapy Evaluation ?Patient Details ?Name: Jackie [name] ?MRN: [id number] ?DOB: [birth year] ?Today's Date: 02/09/2022 ? ? ?History of Present Illness 88 yo female admitted with rhabdomyolysis, increased troponin after a fall at home.Hx of falls, L hip fx s/p THA 2015, pelvic fracture, T spine fx.  ? ?Clinical Impression ?  ?Patient is a 88 year old female who was noted to have had a decline in ability to participate in ADLs. Patient currently is max A for LB dressing with strong posterior lean in standing. Patient was noted to have decreased functional activity tolerance, decreased endurance, decreased safety awareness, and increased pain with movement impacting participation in ADLs. Patient would continue to benefit from skilled OT services at this time while admitted and after d/c to address noted deficits in order to improve overall safety and independence in ADLs.  ? ?   ? ?Recommendations for follow up therapy are one component of a multi-disciplinary discharge planning process, led by the attending physician.  Recommendations may be updated based on patient status, additional functional criteria and insurance authorization.  ? ?Follow Up Recommendations ? Skilled nursing-short term rehab (<3 hours/day)  ?  ?Assistance Recommended at Discharge Frequent or constant Supervision/Assistance  ?Patient can return home with the following A lot of help with walking and/or transfers;A lot of help with bathing/dressing/bathroom;Assistance with cooking/housework;Direct supervision/assist for financial management;Assist for transportation;Help with stairs or ramp for entrance;Direct supervision/assist for medications management ? ?  ?Functional Status Assessment ?    ?Equipment Recommendations ? Other (comment) (TBD)  ?  ?Recommendations for Other Services   ? ? ?  ?Precautions / Restrictions Precautions ?Precautions: Fall ?Restrictions ?Weight Bearing Restrictions: No  ? ?  ? ?Mobility Bed Mobility ?  ?  ?  ?   ?  ?  ?  ?General bed mobility comments: patient was up in recliner. patient requested to remain in recliner at this time. ?  ? ?Transfers ?  ?  ?  ?  ?  ?  ?  ?  ?  ?  ?  ? ?  ?Balance Overall balance assessment: Needs assistance, History of Falls ?  ?  ?  ?Postural control: Posterior lean ?Standing balance support: Bilateral upper extremity supported, During functional activity, Reliant on assistive device for balance ?  ?Standing balance comment: strong posterior leaning. ?  ?  ?  ?  ?  ?  ?  ?  ?  ?  ?  ?   ? ?ADL either performed or assessed with clinical judgement  ? ?ADL Overall ADL's : Needs assistance/impaired ?Eating/Feeding: Set up;Sitting ?Eating/Feeding Details (indicate cue type and reason): in recliner ?Grooming: Wash/dry face;Sitting;Set up ?  ?Upper Body Bathing: Minimal assistance;Sitting ?  ?Lower Body Bathing: Maximal assistance;Sit to/from stand;Sitting/lateral leans ?  ?Upper Body Dressing : Minimal assistance;Sitting ?  ?Lower Body Dressing: Maximal assistance;Sit to/from stand;Sitting/lateral leans ?Lower Body Dressing Details (indicate cue type and reason): attempted to don sock with max A needed to complete task. patient reported increased pain with attempts. patient reported multiple times that she donned her own socks in the room this AM. ?Toilet Transfer: Maximal assistance ?Toilet Transfer Details (indicate cue type and reason): patient in standing at recliner with max A posterior leaning noted. attempted to educate on how to correct balance with bleeding started again in old IV site with session stopped at this time. pressure was applied to site and nurse was called into room. ?Toileting- Clothing Manipulation and Hygiene: Maximal assistance;Sit to/from stand ?Toileting - Clothing  Manipulation Details (indicate cue type and reason): strong posterior leaning. ?  ?  ?  ?   ? ? ? ?Vision Baseline Vision/History: 1 Wears glasses ?Patient Visual Report: No change from baseline ?   ?    ?Perception   ?  ?Praxis   ?  ? ?Pertinent Vitals/Pain Pain Assessment ?Pain Assessment: Faces ?Faces Pain Scale: Hurts even more ?Pain Location: entire spine and L side hip/pelvis area ?Pain Descriptors / Indicators: Discomfort, Sore, Aching ?Pain Intervention(s): Limited activity within patient's tolerance, Monitored during session, Repositioned  ? ? ? ?Hand Dominance Right ?  ?Extremity/Trunk Assessment Upper Extremity Assessment ?Upper Extremity Assessment: RUE deficits/detail ?RUE Deficits / Details: unable to formally test MMT with IV loose upon entry and it leaving arm during session with impacting participation in session. nurse called into room to address bleeding from old IV site x2 during session. ?  ?Lower Extremity Assessment ?Lower Extremity Assessment: Defer to PT evaluation ?  ?Cervical / Trunk Assessment ?Cervical / Trunk Assessment: Kyphotic ?  ?Communication Communication ?Communication: No difficulties ?  ?Cognition Arousal/Alertness: Awake/alert ?Behavior During Therapy: [facility] for tasks assessed/performed ?Overall Cognitive Status: Within Functional Limits for tasks assessed ?  ?  ?  ?  ?  ?  ?  ?  ?  ?  ?  ?  ?  ?  ?  ?  ?General Comments: patient was noted to have minimal confusion with PLOF questions with son able to provide answers/ clarifications ?  ?  ?General Comments    ? ?  ?Exercises   ?  ?Shoulder Instructions    ? ? ?Home Living Family/patient expects to be discharged to:: Private residence ?Living Arrangements: Alone ?Available Help at Discharge: Family;Personal care attendant (has comfort keepers) ?Type of Home: House ?Home Access: Stairs to enter ?Entrance Stairs-Number of Steps: 2 ?Entrance Stairs-Rails: Right ?Home Layout: One level ?  ?  ?  ?  ?  ?  ?  ?Home Equipment: Rollator (4 wheels);BSC/3in1;Grab bars - tub/shower ?  ?Additional Comments: I think she has BSC set up over toilet ?  ? ?  ?Prior Functioning/Environment Prior Level of Function : Needs assist ?  ?  ?  ?  ?  ?   ?Mobility Comments: uses rollator ?ADLs Comments: reported completing all bathing, toileting, and dressing tasks herself at home ?  ? ?  ?  ?OT Problem List: Decreased strength;Decreased activity tolerance;Impaired balance (sitting and/or standing);Decreased safety awareness;Cardiopulmonary status limiting activity;Decreased knowledge of precautions;Decreased knowledge of use of DME or AE ?  ?   ?OT Treatment/Interventions: Self-care/ADL training;Therapeutic exercise;Neuromuscular education;Energy conservation;DME and/or AE instruction;Therapeutic activities;Balance training;Patient/family education  ?  ?OT Goals(Current goals can be found in the care plan section) Acute Rehab OT Goals ?Patient Stated Goal: to get stronger ?OT Goal Formulation: With patient/family ?Time For Goal Achievement: 02/23/22 ?Potential to Achieve Goals: Fair  ?OT Frequency: Min 2X/week ?  ? ?Co-evaluation   ?  ?  ?  ?  ? ?  ?AM-PAC OT "6 Clicks" Daily Activity     ?Outcome Measure Help from another person eating meals?: A Little ?Help from another person taking care of personal grooming?: A Little ?Help from another person toileting, which includes using toliet, bedpan, or urinal?: A Lot ?Help from another person bathing (including washing, rinsing, drying)?: A Lot ?Help from another person to put on and taking off regular upper body clothing?: A Little ?Help from another person to put on and taking off regular lower body clothing?: A Lot ?  6 Click Score: 15 ?  ?End of Session Equipment Utilized During Treatment: Rolling walker (2 wheels) ?Nurse Communication: Other (comment) (IV issues) ? ?Activity Tolerance: Patient tolerated treatment well ?Patient left: in chair;with call bell/phone within reach;with nursing/sitter in room;with family/visitor present;with chair alarm set ? ?OT Visit Diagnosis: Unsteadiness on feet (R26.81);Other abnormalities of gait and mobility (R26.89);History of falling (Z91.81)  ?              ?Time: 1021-1173 ?OT  Time Calculation (min): 23 min ?Charges:  OT General Charges ?$OT Visit: 1 Visit ?OT Evaluation ?$OT Eval Moderate Complexity: 1 Mod ?OT Treatments ?$Therapeutic Activity: 8-22 mins ? ?Shiven Junious OTR/L, MS ?A

## 2022-02-09 NOTE — Evaluation (Signed)
Physical Therapy Evaluation ?Patient Details ?Name: Jackie [name] ?MRN: [id number] ?DOB: Apr 19, 1932 ?Today's Date: 02/09/2022 ? ?History of Present Illness ? 88 yo female admitted with rhabdomyolysis, increased troponin after a fall at home.Hx of falls, L hip fx s/p THA 2015, pelvic fracture, T spine fx.  ?Clinical Impression ? On eval, pt required Mod A for mobility. She was able to stand and take steps over to the recliner with RW. Pt rated pain 8/10. Pt presents with general weakness, decreased activity tolerance, and impaired gait and balance. Daughter was present during session-discussed d/c plan-family prefers for pt to d/c home once medically stable. Will recommend HHPT f/u.    ?   ? ?Recommendations for follow up therapy are one component of a multi-disciplinary discharge planning process, led by the attending physician.  Recommendations may be updated based on patient status, additional functional criteria and insurance authorization. ? ?Follow Up Recommendations Home health PT (daughter declines placement, prefers home) ? ?  ?Assistance Recommended at Discharge Frequent or constant Supervision/Assistance  ?Patient can return home with the following ? Assistance with cooking/housework;Assist for transportation;Help with stairs or ramp for entrance;A lot of help with walking and/or transfers;A lot of help with bathing/dressing/bathroom ? ?  ?Equipment Recommendations BSC/3in1 (possibly)  ?Recommendations for Other Services ?    ?  ?Functional Status Assessment Patient has had a recent decline in their functional status and demonstrates the ability to make significant improvements in function in a reasonable and predictable amount of time.  ? ?  ?Precautions / Restrictions Precautions ?Precautions: Fall ?Restrictions ?Weight Bearing Restrictions: No  ? ?  ? ?Mobility ? Bed Mobility ?Overal bed mobility: Needs Assistance ?Bed Mobility: Rolling, Sidelying to Sit ?Rolling: Mod assist ?Sidelying to sit: Mod  assist ?  ?  ?  ?General bed mobility comments: Assist for trunk and LEs. Increased time. Task is effortful and painful for pt. Cues provided. ?  ? ?Transfers ?Overall transfer level: Needs assistance ?Equipment used: Rolling walker (2 wheels) ?Transfers: Sit to/from Stand, Bed to chair/wheelchair/BSC ?Sit to Stand: Min assist, From elevated surface ?  ?Step pivot transfers: Min assist, +2 safety/equipment ?  ?  ?  ?General transfer comment: Assist to power up, stabilize,manage RW, control descent. Posterior bias. Cues for technique/anterior weighshifting, safety. Allowed pt to pull up on walker to try to encourage forward flexion. Mobilty limited by pain, fatigue, weakness. ?  ? ?Ambulation/Gait ?  ?  ?  ?  ?  ?  ?  ?  ? ?Stairs ?  ?  ?  ?  ?  ? ?Wheelchair Mobility ?  ? ?Modified Rankin (Stroke Patients Only) ?  ? ?  ? ?Balance Overall balance assessment: Needs assistance, History of Falls ?  ?  ?  ?Postural control: Posterior lean ?Standing balance support: Bilateral upper extremity supported, During functional activity, Reliant on assistive device for balance ?Standing balance-Leahy Scale: Poor ?  ?  ?  ?  ?  ?  ?  ?  ?  ?  ?  ?  ?   ? ? ? ?Pertinent Vitals/Pain Pain Assessment ?Pain Assessment: 0-10 ?Pain Score: 8  ?Pain Location: entire spine and L side hip/pelvis area ?Pain Descriptors / Indicators: Discomfort, Sore, Aching ?Pain Intervention(s): Limited activity within patient's tolerance, Monitored during session, Repositioned  ? ? ?Home Living Family/patient expects to be discharged to:: Private residence ?Living Arrangements: Alone ?Available Help at Discharge: Family;Personal care attendant (has comfort keepers) ?Type of Home: House ?Home Access: Stairs to enter ?Entrance  Stairs-Rails: Right ?Entrance Stairs-Number of Steps: 2 ?  ?Home Layout: One level ?Home Equipment: Rollator (4 wheels);BSC/3in1;Grab bars - tub/shower ?Additional Comments: I think she has BSC set up over toilet  ?  ?Prior Function  Prior Level of Function : Needs assist ?  ?  ?  ?  ?  ?  ?Mobility Comments: uses rollator ?  ?  ? ? ?Hand Dominance  ?   ? ?  ?Extremity/Trunk Assessment  ? Upper Extremity Assessment ?Upper Extremity Assessment: Defer to OT evaluation ?  ? ?Lower Extremity Assessment ?Lower Extremity Assessment: Generalized weakness ?  ? ?Cervical / Trunk Assessment ?Cervical / Trunk Assessment: Normal  ?Communication  ? Communication: No difficulties  ?Cognition Arousal/Alertness: Awake/alert ?Behavior During Therapy: [facility] for tasks assessed/performed ?Overall Cognitive Status: Within Functional Limits for tasks assessed ?  ?  ?  ?  ?  ?  ?  ?  ?  ?  ?  ?  ?  ?  ?  ?  ?  ?  ?  ? ?  ?General Comments   ? ?  ?Exercises    ? ?Assessment/Plan  ?  ?PT Assessment Patient needs continued PT services  ?PT Problem List Decreased strength;Decreased mobility;Decreased activity tolerance;Decreased balance;Decreased knowledge of use of DME;Pain ? ?   ?  ?PT Treatment Interventions DME instruction;Gait training;Balance training;Therapeutic exercise;Functional mobility training;Therapeutic activities;Patient/family education   ? ?PT Goals (Current goals can be found in the Care Plan section)  ?Acute Rehab PT Goals ?Patient Stated Goal: less pain. regain PLOF/independence ?PT Goal Formulation: With patient/family ?Time For Goal Achievement: 02/23/22 ?Potential to Achieve Goals: Good ? ?  ?Frequency Min 3X/week ?  ? ? ?Co-evaluation   ?  ?  ?  ?  ? ? ?  ?AM-PAC PT "6 Clicks" Mobility  ?Outcome Measure Help needed turning from your back to your side while in a flat bed without using bedrails?: A Little ?Help needed moving from lying on your back to sitting on the side of a flat bed without using bedrails?: A Lot ?Help needed moving to and from a bed to a chair (including a wheelchair)?: A Little ?Help needed standing up from a chair using your arms (e.g., wheelchair or bedside chair)?: A Lot ?Help needed to walk in hospital room?: A Lot ?Help  needed climbing 3-5 steps with a railing? : Total ?6 Click Score: 13 ? ?  ?End of Session Equipment Utilized During Treatment: Gait belt ?Activity Tolerance: Patient limited by fatigue;Patient limited by pain ?Patient left: in chair;with call bell/phone within reach;with chair alarm set;with nursing/sitter in room;with family/visitor present ?  ?PT Visit Diagnosis: History of falling (Z91.81);Pain;Muscle weakness (generalized) (M62.81);Difficulty in walking, not elsewhere classified (R26.2) ?  ? ?Time: 4132-4401 ?PT Time Calculation (min) (ACUTE ONLY): 18 min ? ? ?Charges:   PT Evaluation ?$PT Eval Moderate Complexity: 1 Mod ?  ?  ?   ? ? ? ? ? ?Izzie Geers P, PT ?Acute Rehabilitation  ?Office: [phone] ?Pager: [phone] ? ?  ? ?

## 2022-02-09 NOTE — Progress Notes (Signed)
?PROGRESS NOTE ? ? ? ?Jackie [name]  QMG:[id number] DOB: [birth year] DOA: 02/08/2022 ?PCP: [name], MD  ? ? ? ?Brief Narrative:  ? ?Jackie [name] is a 88 y.o. female with medical history significant of the fracture, depression, insomnia, esophageal diverticulum, anemia, BPPV, diastolic dysfunction, hypertension, hyperlipidemia, peripheral vascular disease, hypothyroidism presenting after a fall at home.  ? ?Subjective: ? ?She is sitting up in chair, she is pleasant, not oriented to the year, daughter and son reports this is close to her baseline  ?She could not remember how long she was on the floor ?Daughter reports patient has signficant halluciantion while ems arrived ?Currently she denies pain, denies urinary issues  ?There is no fever ? ?Assessment & Plan: ? Principal Problem: ?  Fall ?Active Problems: ?  Depressed mood ?  Insomnia ?  Diastolic dysfunction ?  Essential hypertension ?  Hyperlipidemia ?  Peripheral vascular disease ([place]) ?  Postoperative hypothyroidism ?  Rhabdomyolysis ?  Hallucination ?  Falls ? ? ? ?Assessment and Plan: ? ?Fall /rhabdomyolysis ? Presenting after fall where she was down to 20 hours.  Appears to be mechanical fall the patient is unsure of the events surrounding the fall.  Not sure if she hit her head. ?> Imaging in the ED negative for any acute abnormalities on chest x-ray, x-ray of the L-spine, x-ray of the pelvis, CT head, CT C-spine. ?Continue IV hydration ?Trend CK ?PT OT eval ?Fall precaution ?Family declined SNF placement ? ?Elevation of troponin ?Likely demand ischemia ?No chest pain ?Follow-up with cardiology outpatient ? ?UTI? ?Urine culture ?Rocephin ?Bladder scan for PVR ? ?Hypothyroidism ?Continue Synthroid ? ? ?Depression/insomnia/hallucination ?Psych consulted , will follow recommendation  ? ? ?Impaired memory in the last year, has intermittent hallucination after awaken ?But normally she lives by herself, having  ?RN coming mwf,  can ambulate slowly   ? ? ?Nutritional Assessment: ?The patient?s BMI is: Body mass index is 24.58 kg/m?Marland KitchenMarland Kitchen ?Seen by dietician.  I agree with the assessment and plan as outlined below: ?Nutrition Status: ?  ?  ?   ? ? ?I have Reviewed nursing notes, Vitals, pain scores, I/o's, Lab results and  imaging results since pt's last encounter, details please see discussion above  ?I ordered the following labs:  ?Unresulted Labs (From admission, onward)  ? ?  Start     Ordered  ? 02/15/22 0500  Creatinine, serum  (enoxaparin (LOVENOX)    CrCl >/= 30 ml/min)  Weekly,   R     ?Comments: while on enoxaparin therapy ?  ? 02/08/22 1848  ? 02/10/22 0500  Comprehensive metabolic panel  Tomorrow morning,   R       ? 02/09/22 2049  ? 02/10/22 0500  CK  Tomorrow morning,   R       ? 02/09/22 2049  ? 02/09/22 1056  Urine Culture  Once,   R       ?Question:  Indication  Answer:  Altered mental status (if no other cause identified)  ? 02/09/22 1055  ? ?  ?  ? ?  ? ? ? ?DVT prophylaxis: enoxaparin (LOVENOX) injection 40 mg Start: 02/08/22 2200 ? ? ?Code Status:   Code Status: Full Code ? ?Family Communication: daughter and son at bedside  ?Disposition:  ? ? ?Dispo: The patient is from: home ?             Anticipated d/c is to: home with home health , daughter declined SNF placement, daughter  will arrange 24/7 care ?             Anticipated d/c date is: TBD ? ?Antimicrobials:   ?Anti-infectives (From admission, onward)  ? ? Start     Dose/Rate Route Frequency Ordered Stop  ? 02/09/22 1200  cefTRIAXone (ROCEPHIN) 1 g in sodium chloride 0.9 % 100 mL IVPB       ?Note to Pharmacy: Start abx after urine culture obtained  ? 1 g ?200 mL/hr over 30 Minutes Intravenous Every 24 hours 02/09/22 1058    ? ?  ? ? ? ? ? ? ?Objective: ?Vitals:  ? 02/09/22 0610 02/09/22 0918 02/09/22 1401 02/09/22 2041  ?BP: (!) 117/50 (!) 118/54 (!) 117/55 128/60  ?Pulse: 62 64 62 (!) 59  ?Resp: '20 19 17 20  '$ ?Temp: 98 ?F (36.7 ?C) 98.4 ?F (36.9 ?C) 97.7 ?F (36.5 ?C) 97.6 ?F (36.4 ?C)   ?TempSrc: Oral Oral Oral Oral  ?SpO2: 98% 95% 98% 95%  ?Weight:      ?Height:      ? ? ?Intake/Output Summary (Last 24 hours) at 02/09/2022 2049 ?Last data filed at 02/09/2022 1744 ?Gross per 24 hour  ?Intake 798.71 ml  ?Output 150 ml  ?Net 648.71 ml  ? ?Filed Weights  ? 02/08/22 1533 02/08/22 1547  ?Weight: 59 kg 59 kg  ? ? ?Examination: ? ?General exam: frail, alert, awake, not oriented to the year ?Respiratory system: Clear to auscultation. Respiratory effort normal. ?Cardiovascular system:  RRR.  ?Gastrointestinal system: Abdomen is nondistended, soft and nontender.  Normal bowel sounds heard. ?Central nervous system: Alert and orientedx2,  No focal neurological deficits. ?Extremities:  no edema ?Skin: No rashes, lesions or ulcers ?Psychiatry: Judgement and insight appear normal. Mood & affect appropriate.  ? ? ? ?Data Reviewed: I have personally reviewed  labs and visualized  imaging studies since the last encounter and formulate the plan  ? ? ? ? ? ? ?Scheduled Meds: ? aspirin EC  81 mg Oral Daily  ? enoxaparin (LOVENOX) injection  40 mg Subcutaneous Q24H  ? levothyroxine  112 mcg Oral Daily  ? sodium chloride flush  3 mL Intravenous Q12H  ? ?Continuous Infusions: ? sodium chloride 75 mL/hr at 02/09/22 1640  ? cefTRIAXone (ROCEPHIN)  IV Stopped (02/09/22 1330)  ? ? ? LOS: 0 days  ? ? ? ? ?Florencia Reasons, MD PhD FACP ?[facility] ? ?Available via Epic secure chat 7am-7pm for nonurgent issues ?Please page for urgent issues ?To page the attending provider between 7A-7P or the covering provider during after hours 7P-7A, please log into the web site [email or web address] and access using universal [place] password for that web site. If you do not have the password, please call the hospital operator. ? ? ? ?02/09/2022, 8:49 PM  ? ? ?

## 2022-02-10 DIAGNOSIS — T796XXA Traumatic ischemia of muscle, initial encounter: Secondary | ICD-10-CM | POA: Diagnosis not present

## 2022-02-10 DIAGNOSIS — R778 Other specified abnormalities of plasma proteins: Secondary | ICD-10-CM | POA: Diagnosis not present

## 2022-02-10 DIAGNOSIS — R7989 Other specified abnormal findings of blood chemistry: Secondary | ICD-10-CM | POA: Diagnosis not present

## 2022-02-10 DIAGNOSIS — R627 Adult failure to thrive: Secondary | ICD-10-CM | POA: Diagnosis not present

## 2022-02-10 LAB — COMPREHENSIVE METABOLIC PANEL
ALT: 30 U/L (ref 0–44)
AST: 53 U/L — ABNORMAL HIGH (ref 15–41)
Albumin: 3.2 g/dL — ABNORMAL LOW (ref 3.5–5.0)
Alkaline Phosphatase: 49 U/L (ref 38–126)
Anion gap: 8 (ref 5–15)
BUN: 16 mg/dL (ref 8–23)
CO2: 21 mmol/L — ABNORMAL LOW (ref 22–32)
Calcium: 8.1 mg/dL — ABNORMAL LOW (ref 8.9–10.3)
Chloride: 106 mmol/L (ref 98–111)
Creatinine, Ser: 0.77 mg/dL (ref 0.44–1.00)
GFR, Estimated: >60 mL/min (ref >60)
Glucose, Bld: 96 mg/dL (ref 70–99)
Potassium: 3.6 mmol/L (ref 3.5–5.1)
Sodium: 135 mmol/L (ref 135–145)
Total Bilirubin: 0.6 mg/dL (ref 0.3–1.2)
Total Protein: 6.5 g/dL (ref 6.5–8.1)

## 2022-02-10 LAB — CK: Total CK: 850 U/L — ABNORMAL HIGH (ref 38–234)

## 2022-02-10 MED ORDER — OLANZAPINE 2.5 MG PO TABS
2.5000 mg | ORAL_TABLET | Freq: Every day | ORAL | Status: DC
  Administered 2022-02-10 – 2022-02-14 (×4): 2.5 mg via ORAL
  Filled 2022-02-10 (×5): qty 1

## 2022-02-10 MED ORDER — LEVOTHYROXINE SODIUM 100 MCG PO TABS
100.0000 ug | ORAL_TABLET | Freq: Every day | ORAL | Status: DC
  Administered 2022-02-11: 100 ug via ORAL
  Filled 2022-02-10: qty 1

## 2022-02-10 MED ORDER — VITAMIN B-12 1000 MCG PO TABS
1000.0000 ug | ORAL_TABLET | Freq: Every day | ORAL | Status: DC
  Administered 2022-02-10 – 2022-02-15 (×6): 1000 ug via ORAL
  Filled 2022-02-10 (×6): qty 1

## 2022-02-10 MED ORDER — LEVOTHYROXINE SODIUM 50 MCG PO TABS: 50.0000 ug | ORAL_TABLET | Freq: Every day | ORAL | Status: DC

## 2022-02-10 NOTE — Progress Notes (Signed)
?PROGRESS NOTE ? ? ? ?Jackie [name]  GHW:[id number] DOB: November 24, 1932 DOA: 02/08/2022 ?PCP: [name], MD  ? ? ? ?Brief Narrative:  ? ?Jackie [name] is a 88 y.o. female with medical history significant of the fracture, depression, insomnia, esophageal diverticulum, anemia, BPPV, diastolic dysfunction, hypertension, hyperlipidemia, peripheral vascular disease, hypothyroidism presenting after a fall at home.  ? ?Subjective: ? ?Has delirium yesterday evening/night: ?Pt found attempting to get out of bed twice "to get dressed for the day."  Pt re-oriented.  Pt confused, trying to get up and pulling at necessary lines for patient care.  Patient's daughter contacted and agreed to the restraints for patient safety. ? ?This morning, She is sitting up in chair, she is pleasant, she is aaox3,   ?Reports low back pain ? ?There is no fever ?Daughter at bedside ? ?Assessment & Plan: ? Principal Problem: ?  Fall ?Active Problems: ?  Depressed mood ?  Insomnia ?  Diastolic dysfunction ?  Essential hypertension ?  Hyperlipidemia ?  Peripheral vascular disease ([place]) ?  Postoperative hypothyroidism ?  Rhabdomyolysis ?  Hallucination ?  Falls ? ? ? ?Assessment and Plan: ? ?Fall /rhabdomyolysis ? Presenting after fall where she was down to 20 hours.  Appears to be mechanical fall the patient is unsure of the events surrounding the fall.  Not sure if she hit her head. ?> Imaging in the ED negative for any acute abnormalities on chest x-ray, x-ray of the L-spine, x-ray of the pelvis, CT head, CT C-spine. ?Continue IV hydration ?CK is trending down ?PT OT eval ?Fall precaution ?Family now is agreeing with SNF placement ? ?Elevation of troponin ?Likely demand ischemia ?No chest pain ?Follow-up with cardiology outpatient ? ?UTI? ?Urine culture in process ?Rocephin ?PVR 59cc ? ?Hypothyroidism ?Suppressed tsh, could contribute to anxiety, hallucination ?Decrease synthroid ? ? ? ?Depression/insomnia/hallucination/delirium/ likely underline  dementia  ?Appreciate Psych input, start with olanzapine qhs, daughter agrees  ?Daughter does not desire neurology referral , she is concerned about neurological evaluation could aggravate her mother, she is also not keen to start dementia medication due to her mother-in-law had side effects from Aricept in the past ? ? ? ?Nutritional Assessment: ?The patient?s BMI is: Body mass index is 24.58 kg/m?Marland KitchenMarland Kitchen ?Seen by dietician.  I agree with the assessment and plan as outlined below: ?Nutrition Status: ?  ?  ?   ? ? ?I have Reviewed nursing notes, Vitals, pain scores, I/o's, Lab results and  imaging results since pt's last encounter, details please see discussion above  ?I ordered the following labs:  ?Unresulted Labs (From admission, onward)  ? ?  Start     Ordered  ? 02/15/22 0500  Creatinine, serum  (enoxaparin (LOVENOX)    CrCl >/= 30 ml/min)  Weekly,   R     ?Comments: while on enoxaparin therapy ?  ? 02/08/22 1848  ? 02/11/22 0500  Comprehensive metabolic panel  Tomorrow morning,   R       ? 02/10/22 1641  ? 02/11/22 0500  CK  Tomorrow morning,   R       ? 02/10/22 1641  ? 02/11/22 0500  Magnesium  Tomorrow morning,   STAT       ? 02/10/22 1641  ? 02/09/22 1056  Urine Culture  Once,   R       ?Question:  Indication  Answer:  Altered mental status (if no other cause identified)  ? 02/09/22 1055  ? ?  ?  ? ?  ? ? ? ?  DVT prophylaxis: enoxaparin (LOVENOX) injection 40 mg Start: 02/08/22 2200 ? ? ?Code Status:   Code Status: Full Code ? ?Family Communication: daughter at bedside  ?Disposition:  ? ? ?Dispo: The patient is from: home ?             Anticipated d/c is to: SNF              ? Anticipated d/c date is: awaiting for urine culture, she appear is improving, hopefully can d/c to snf in 48hrs ? ?Antimicrobials:   ?Anti-infectives (From admission, onward)  ? ? Start     Dose/Rate Route Frequency Ordered Stop  ? 02/09/22 1200  cefTRIAXone (ROCEPHIN) 1 g in sodium chloride 0.9 % 100 mL IVPB       ?Note to Pharmacy: Start  abx after urine culture obtained  ? 1 g ?200 mL/hr over 30 Minutes Intravenous Every 24 hours 02/09/22 1058    ? ?  ? ? ? ? ? ? ?Objective: ?Vitals:  ? 02/09/22 0918 02/09/22 1401 02/09/22 2041 02/10/22 5366  ?BP: (!) 118/54 (!) 117/55 128/60 (!) 161/70  ?Pulse: 64 62 (!) 59 63  ?Resp: '19 17 20 20  '$ ?Temp: 98.4 ?F (36.9 ?C) 97.7 ?F (36.5 ?C) 97.6 ?F (36.4 ?C) 97.6 ?F (36.4 ?C)  ?TempSrc: Oral Oral Oral Oral  ?SpO2: 95% 98% 95% 97%  ?Weight:      ?Height:      ? ? ?Intake/Output Summary (Last 24 hours) at 02/10/2022 1645 ?Last data filed at 02/10/2022 1300 ?Gross per 24 hour  ?Intake 1818.71 ml  ?Output 400 ml  ?Net 1418.71 ml  ? ?Filed Weights  ? 02/08/22 1533 02/08/22 1547  ?Weight: 59 kg 59 kg  ? ? ?Examination: ? ?General exam: frail, alert, awake, aaox3 ?Respiratory system: Clear to auscultation. Respiratory effort normal. ?Cardiovascular system:  RRR.  ?Gastrointestinal system: Abdomen is nondistended, soft and nontender.  Normal bowel sounds heard. ?Central nervous system: Alert and orientedx3,  No focal neurological deficits. ?Extremities:  no edema ?Skin: No rashes, lesions or ulcers ?Psychiatry: has agitation last night, currently calm and cooperative  ? ? ? ?Data Reviewed: I have personally reviewed  labs and visualized  imaging studies since the last encounter and formulate the plan  ? ? ? ? ? ? ?Scheduled Meds: ? aspirin EC  81 mg Oral Daily  ? enoxaparin (LOVENOX) injection  40 mg Subcutaneous Q24H  ? [START ON 02/11/2022] levothyroxine  50 mcg Oral Q0600  ? OLANZapine  2.5 mg Oral QHS  ? sodium chloride flush  3 mL Intravenous Q12H  ? vitamin B-12  1,000 mcg Oral Daily  ? ?Continuous Infusions: ? cefTRIAXone (ROCEPHIN)  IV 1 g (02/10/22 1310)  ? ? ? LOS: 1 day  ? ? ? ? ?Florencia Reasons, MD PhD FACP ?[facility] ? ?Available via Epic secure chat 7am-7pm for nonurgent issues ?Please page for urgent issues ?To page the attending provider between 7A-7P or the covering provider during after hours 7P-7A, please  log into the web site [email or web address] and access using universal [place] password for that web site. If you do not have the password, please call the hospital operator. ? ? ? ?02/10/2022, 4:45 PM  ? ? ?

## 2022-02-10 NOTE — Progress Notes (Addendum)
Pt placed in Non-violent soft wrist restraints.  Less restrictive measures not effective.  Bed alarm on.  Pt near the nurses station.   Pt found attempting to get out of bed twice "to get dressed for the day."  Pt re-oriented.  Pt confused, trying to get up and pulling at necessary lines for patient care.  Patient's daughter contacted and agreed to the restraints for patient safety. ?New order received. ? ?

## 2022-02-10 NOTE — TOC Progression Note (Addendum)
Transition of Care (TOC) - Progression Note  ? ? ?Patient Details  ?Name: NEKO MCGEEHAN ?MRN: [id number] ?Date of Birth: 05-27-1932 ? ?Transition of Care (TOC) CM/SW Contact  ?Tawanna Cooler, RN ?Phone Number: ?02/10/2022, 9:50 AM ? ?Clinical Narrative:    ? ?Current PT recommendation is for home health.  Patient's daughter does not want SNF, patient's son is open to it.   ? ?Patient and son agreed to home health and had no preference for home health provider when CM [facility] spoke with them yesterday.   ? ?Spoke with Tommi Rumps at [facility].  They have availability and have accepted patient for home health services.  ? ?Addendum: Patient's family has changed their minds and want patient to go to SNF now.  Spoke with patient and her daughter, Karna Christmas, at bedside.  Explained that TOC CM will need an updated PT note recommending SNF before we can send a referral.  Explained after we have an accepting facility we will have to get insurance auth.  Terri verbalized understanding and states they'd like patient to go to the same SNF she went to in 2015 after her hip fracture.  States it's "a place in [place] that starts with an A."   ? ? ?

## 2022-02-11 LAB — CBC WITH DIFFERENTIAL/PLATELET
Abs Immature Granulocytes: 0.01 10*3/uL (ref 0.00–0.07)
Basophils Absolute: 0.1 10*3/uL (ref 0.0–0.1)
Basophils Relative: 1 %
Eosinophils Absolute: 0.2 10*3/uL (ref 0.0–0.5)
Eosinophils Relative: 3 %
HCT: 40.3 % (ref 36.0–46.0)
Hemoglobin: 13 g/dL (ref 12.0–15.0)
Immature Granulocytes: 0 %
Lymphocytes Relative: 22 %
Lymphs Abs: 1.4 10*3/uL (ref 0.7–4.0)
MCH: 30.4 pg (ref 26.0–34.0)
MCHC: 32.3 g/dL (ref 30.0–36.0)
MCV: 94.2 fL (ref 80.0–100.0)
Monocytes Absolute: 0.4 10*3/uL (ref 0.1–1.0)
Monocytes Relative: 6 %
Neutro Abs: 4.3 10*3/uL (ref 1.7–7.7)
Neutrophils Relative %: 68 %
Platelets: 255 10*3/uL (ref 150–400)
RBC: 4.28 MIL/uL (ref 3.87–5.11)
RDW: 13.6 % (ref 11.5–15.5)
WBC: 6.3 10*3/uL (ref 4.0–10.5)
nRBC: 0 % (ref 0.0–0.2)

## 2022-02-11 LAB — CK: Total CK: 409 U/L — ABNORMAL HIGH (ref 38–234)

## 2022-02-11 LAB — COMPREHENSIVE METABOLIC PANEL
ALT: 27 U/L (ref 0–44)
AST: 36 U/L (ref 15–41)
Albumin: 3 g/dL — ABNORMAL LOW (ref 3.5–5.0)
Alkaline Phosphatase: 46 U/L (ref 38–126)
Anion gap: 6 (ref 5–15)
BUN: 11 mg/dL (ref 8–23)
CO2: 25 mmol/L (ref 22–32)
Calcium: 8.2 mg/dL — ABNORMAL LOW (ref 8.9–10.3)
Chloride: 107 mmol/L (ref 98–111)
Creatinine, Ser: 0.48 mg/dL (ref 0.44–1.00)
GFR, Estimated: >60 mL/min (ref >60)
Glucose, Bld: 88 mg/dL (ref 70–99)
Potassium: 3.5 mmol/L (ref 3.5–5.1)
Sodium: 138 mmol/L (ref 135–145)
Total Bilirubin: 0.2 mg/dL — ABNORMAL LOW (ref 0.3–1.2)
Total Protein: 6.2 g/dL — ABNORMAL LOW (ref 6.5–8.1)

## 2022-02-11 LAB — MAGNESIUM: Magnesium: 2 mg/dL (ref 1.7–2.4)

## 2022-02-11 MED ORDER — POTASSIUM CHLORIDE 20 MEQ PO PACK
40.0000 meq | PACK | Freq: Once | ORAL | Status: AC
  Administered 2022-02-11: 40 meq via ORAL
  Filled 2022-02-11: qty 2

## 2022-02-11 MED ORDER — POLYETHYLENE GLYCOL 3350 17 G PO PACK
17.0000 g | PACK | Freq: Every day | ORAL | Status: DC
  Administered 2022-02-12 – 2022-02-14 (×3): 17 g via ORAL
  Filled 2022-02-11 (×3): qty 1

## 2022-02-11 MED ORDER — SENNOSIDES-DOCUSATE SODIUM 8.6-50 MG PO TABS
1.0000 | ORAL_TABLET | Freq: Two times a day (BID) | ORAL | Status: DC
  Administered 2022-02-11 – 2022-02-15 (×8): 1 via ORAL
  Filled 2022-02-11 (×8): qty 1

## 2022-02-11 MED ORDER — CHOLECALCIFEROL 10 MCG (400 UNIT) PO TABS
400.0000 [IU] | ORAL_TABLET | Freq: Every day | ORAL | Status: DC
  Administered 2022-02-11 – 2022-02-15 (×5): 400 [IU] via ORAL
  Filled 2022-02-11 (×4): qty 1

## 2022-02-11 MED ORDER — LEVOTHYROXINE SODIUM 112 MCG PO TABS
112.0000 ug | ORAL_TABLET | Freq: Every day | ORAL | Status: DC
  Administered 2022-02-12 – 2022-02-15 (×3): 112 ug via ORAL
  Filled 2022-02-11 (×3): qty 1

## 2022-02-11 NOTE — Progress Notes (Signed)
?PROGRESS NOTE ? ? ? ?[name]  RCV:[id number] DOB: January 04, 1932 DOA: 02/08/2022 ?PCP: [name], MD  ? ? ? ?Brief Narrative:  ? ?Jackie [name] is a 88 y.o. female with medical history significant of the fracture, depression, insomnia, esophageal diverticulum, anemia, BPPV, diastolic dysfunction, hypertension, hyperlipidemia, peripheral vascular disease, hypothyroidism presenting after a fall at home.  ? ?Subjective: ? ?Uneventful night, son stayed last night ?This morning, She is sitting up in chair, she is pleasant, she is aaox3,   ? ?There is no fever ?Daughter at bedside reports  zyprexa worked well for her mother ? ?Assessment & Plan: ? Principal Problem: ?  Fall ?Active Problems: ?  Depressed mood ?  Insomnia ?  Diastolic dysfunction ?  Essential hypertension ?  Hyperlipidemia ?  Peripheral vascular disease ([place]) ?  Postoperative hypothyroidism ?  Rhabdomyolysis ?  Hallucination ?  Falls ? ? ? ?Assessment and Plan: ? ?Fall /rhabdomyolysis ? Presenting after fall where she was down to 20 hours.  Appears to be mechanical fall the patient is unsure of the events surrounding the fall.  Not sure if she hit her head. ?> Imaging in the ED negative for any acute abnormalities on chest x-ray, x-ray of the L-spine, x-ray of the pelvis, CT head, CT C-spine. ?Received  IV hydration, CK is trending down, will hold off further hydration, encourage oral intake ?PT OT eval, Fall precaution, Family now is agreeing with SNF placement ? ?Elevation of troponin ?Likely demand ischemia ?No chest pain ?Follow-up with cardiology outpatient ? ?UTI ?UA trace leuk, positive nitrite ?Urine culture grew g-rods ?Continue Rocephin ?PVR 59cc ? ?Hypothyroidism ?Suppressed tsh, could contribute to anxiety, hallucination ?Discussed with endocrinology Dr Forde Dandy who recommend continue home dose synthroid as "She has a history of thyroid cancer. Target TSH is 0.2 to 0.5 " ? ? ? ?Depression/insomnia/hallucination/delirium/ likely underline  dementia  ?Appreciate Psych input, start with olanzapine qhs, daughter reports olanzapine worked well for her mother would like to continue ?Daughter does not desire neurology referral , she is concerned about neurological evaluation could aggravate her mother, she is also not keen to start dementia medication due to her mother-in-law had side effects from Aricept in the past ?B12 in 200 range, will start b12 supplement to target above 500 ? ? ? ? Body mass index is 24.58 kg/m?Marland Kitchen. ? ? ?I have Reviewed nursing notes, Vitals, pain scores, I/o's, Lab results and  imaging results since pt's last encounter, details please see discussion above  ?I ordered the following labs:  ?Unresulted Labs (From admission, onward)  ? ?  Start     Ordered  ? 02/15/22 0500  Creatinine, serum  (enoxaparin (LOVENOX)    CrCl >/= 30 ml/min)  Weekly,   R     ?Comments: while on enoxaparin therapy ?  ? 02/08/22 1848  ? 02/12/22 1025  Basic metabolic panel  Tomorrow morning,   R       ? 02/11/22 1710  ? 02/11/22 1711  CBC with Differential/Platelet  Once,   R       ? 02/11/22 1710  ? ?  ?  ? ?  ? ? ? ?DVT prophylaxis: enoxaparin (LOVENOX) injection 40 mg Start: 02/08/22 2200 ? ? ?Code Status:   Code Status: Full Code ? ?Family Communication: daughter at bedside  ?Disposition:  ? ? ?Dispo: The patient is from: home ?             Anticipated d/c is to: SNF              ?  Anticipated d/c date is: awaiting for urine culture, she appear is improving, hopefully can d/c to snf in 48hrs ? ?Antimicrobials:   ?Anti-infectives (From admission, onward)  ? ? Start     Dose/Rate Route Frequency Ordered Stop  ? 02/09/22 1200  cefTRIAXone (ROCEPHIN) 1 g in sodium chloride 0.9 % 100 mL IVPB       ?Note to Pharmacy: Start abx after urine culture obtained  ? 1 g ?200 mL/hr over 30 Minutes Intravenous Every 24 hours 02/09/22 1058    ? ?  ? ? ? ? ? ? ?Objective: ?Vitals:  ? 02/09/22 2041 02/10/22 5465 02/10/22 2147 02/11/22 0559  ?BP: 128/60 (!) 161/70 (!) 153/72  140/61  ?Pulse: (!) 59 63 64 60  ?Resp: '20 20 16 17  '$ ?Temp: 97.6 ?F (36.4 ?C) 97.6 ?F (36.4 ?C) 98 ?F (36.7 ?C) 97.8 ?F (36.6 ?C)  ?TempSrc: Oral Oral Oral Oral  ?SpO2: 95% 97% 95% 98%  ?Weight:      ?Height:      ? ? ?Intake/Output Summary (Last 24 hours) at 02/11/2022 1711 ?Last data filed at 02/11/2022 0500 ?Gross per 24 hour  ?Intake 593 ml  ?Output 1700 ml  ?Net -1107 ml  ? ?Filed Weights  ? 02/08/22 1533 02/08/22 1547  ?Weight: 59 kg 59 kg  ? ? ?Examination: ? ?General exam: frail, alert, awake, aaox3 ?Respiratory system: Clear to auscultation. Respiratory effort normal. ?Cardiovascular system:  RRR.  ?Gastrointestinal system: Abdomen is nondistended, soft and nontender.  Normal bowel sounds heard. ?Central nervous system: Alert and orientedx3,  No focal neurological deficits. ?Extremities:  no edema ?Skin: No rashes, lesions or ulcers ?Psychiatry: calm and cooperative  ? ? ? ?Data Reviewed: I have personally reviewed  labs and visualized  imaging studies since the last encounter and formulate the plan  ? ? ? ? ? ? ?Scheduled Meds: ? aspirin EC  81 mg Oral Daily  ? cholecalciferol  400 Units Oral Daily  ? enoxaparin (LOVENOX) injection  40 mg Subcutaneous Q24H  ? [START ON 02/12/2022] levothyroxine  112 mcg Oral Daily  ? OLANZapine  2.5 mg Oral QHS  ? [START ON 02/12/2022] polyethylene glycol  17 g Oral Daily  ? senna-docusate  1 tablet Oral BID  ? sodium chloride flush  3 mL Intravenous Q12H  ? vitamin B-12  1,000 mcg Oral Daily  ? ?Continuous Infusions: ? cefTRIAXone (ROCEPHIN)  IV 1 g (02/11/22 1231)  ? ? ? LOS: 2 days  ? ? ? ? ?Florencia Reasons, MD PhD FACP ?[facility] ? ?Available via Epic secure chat 7am-7pm for nonurgent issues ?Please page for urgent issues ?To page the attending provider between 7A-7P or the covering provider during after hours 7P-7A, please log into the web site [email or web address] and access using universal [place] password for that web site. If you do not have the password, please call  the hospital operator. ? ? ? ?02/11/2022, 5:11 PM  ? ? ?

## 2022-02-11 NOTE — Progress Notes (Signed)
Physical Therapy Treatment ?Patient Details ?Name: Jackie [name] ?MRN: [id number] ?DOB: December 21, 1931 ?Today's Date: 02/11/2022 ? ? ?History of Present Illness 88 yo female admitted with rhabdomyolysis, increased troponin after a fall at home.Hx of falls, L hip fx s/p THA 2015, pelvic fracture, T spine fx. ? ?  ?PT Comments  ? ? Patient alert and resting in bed at start of session, pleasant and happy to mobilize. Pt required mod assist to move to Eob and rise from soft low surface to RW. Pt has tendency to lean posterior and multimodal cues required to facilitate anterior weight shift to prevent posterior LOB. Patient ambulated ~60' with RW and chair follow for safety. HR stable throughout in 60-70's during gait. She remains weak and more limited than PTA. Pt will need constant supervision/assist with  mobility and is unable to have this at home. She will need ST rehab at SNF to improved independence with mobility for increased safety once pt is strong enough to return home.Acute PT will continue to progress as able. ? ?  ?Recommendations for follow up therapy are one component of a multi-disciplinary discharge planning process, led by the attending physician.  Recommendations may be updated based on patient status, additional functional criteria and insurance authorization. ? ?Follow Up Recommendations ? Skilled nursing-short term rehab (<3 hours/day) ?  ?  ?Assistance Recommended at Discharge Frequent or constant Supervision/Assistance  ?Patient can return home with the following Assistance with cooking/housework;Assist for transportation;Help with stairs or ramp for entrance;A lot of help with walking and/or transfers;A lot of help with bathing/dressing/bathroom ?  ?Equipment Recommendations ?    ?  ?Recommendations for Other Services   ? ? ?  ?Precautions / Restrictions Precautions ?Precautions: Fall ?Restrictions ?Weight Bearing Restrictions: No  ?  ? ?Mobility ? Bed Mobility ?Overal bed mobility: Needs  Assistance ?Bed Mobility: Supine to Sit ?  ?  ?Supine to sit: Mod assist, HOB elevated, Min assist ?  ?  ?General bed mobility comments: Cues to initiate walking LE's off EOB and to reach for bed rail. min assist for LE to fully pivot to edge, mod assist for raising trunk. ?  ? ?Transfers ?Overall transfer level: Needs assistance ?Equipment used: Rolling walker (2 wheels) ?Transfers: Sit to/from Stand, Bed to chair/wheelchair/BSC ?Sit to Stand: Min assist, From elevated surface, Mod assist ?  ?  ?  ?  ?  ?General transfer comment: Min-mod assist for power up from EOB with pt keeping bil UE's on RW. pt with posterior lean in standing and manual cues at back to shift anterior as well as verbal cues to press great toes into ground. pt completed from EOB and recliner. ?  ? ?Ambulation/Gait ?Ambulation/Gait assistance: Min assist ?Gait Distance (Feet): 60 Feet ?Assistive device: Rolling walker (2 wheels) ?Gait Pattern/deviations: Step-to pattern, Decreased stride length, Trunk flexed, Decreased step length - right, Decreased step length - left ?Gait velocity: decr ?  ?  ?General Gait Details: cues for safe proximity to RW at start and pt maintianed throughout. pt with flexed posture due to significant kyphosis and forward neck posture to compensate. min asssist at back to encourage anterior lean/weight shift to improve balance with gait. ? ? ?Stairs ?  ?  ?  ?  ?  ? ? ?Wheelchair Mobility ?  ? ?Modified Rankin (Stroke Patients Only) ?  ? ? ?  ?Balance Overall balance assessment: Needs assistance, History of Falls ?Sitting-balance support: Feet supported, Bilateral upper extremity supported ?Sitting balance-Leahy Scale: Fair ?Sitting balance - Comments:  posteior lean initially requiring mod assist, pt improved tomin guard. ?Postural control: Posterior lean ?Standing balance support: Bilateral upper extremity supported, During functional activity, Reliant on assistive device for balance ?Standing balance-Leahy Scale:  Poor ?Standing balance comment: posterior lean improved with cues for anterior weight shift. ?  ?  ?  ?  ?  ?  ?  ?  ?  ?  ?  ?  ? ?  ?Cognition Arousal/Alertness: Awake/alert ?Behavior During Therapy: [facility] for tasks assessed/performed ?Overall Cognitive Status: Within Functional Limits for tasks assessed ?  ?  ?  ?  ?  ?  ?  ?  ?  ?  ?  ?  ?  ?  ?  ?  ?General Comments: patient was noted to have minimal confusion with PLOF questions with son able to provide answers/ clarifications ?  ?  ? ?  ?Exercises   ? ?  ?General Comments   ?  ?  ? ?Pertinent Vitals/Pain Pain Assessment ?Pain Assessment: No/denies pain ?Pain Intervention(s): Limited activity within patient's tolerance, Monitored during session, Repositioned  ? ? ?Home Living   ?  ?  ?  ?  ?  ?  ?  ?  ?  ?   ?  ?Prior Function    ?  ?  ?   ? ?PT Goals (current goals can now be found in the care plan section) Acute Rehab PT Goals ?Patient Stated Goal: less pain. regain PLOF/independence ?PT Goal Formulation: With patient/family ?Time For Goal Achievement: 02/23/22 ?Potential to Achieve Goals: Good ?Progress towards PT goals: Progressing toward goals ? ?  ?Frequency ? ? ? Min 3X/week ? ? ? ?  ?PT Plan Discharge plan needs to be updated  ? ? ?Co-evaluation   ?  ?  ?  ?  ? ?  ?AM-PAC PT "6 Clicks" Mobility   ?Outcome Measure ? Help needed turning from your back to your side while in a flat bed without using bedrails?: A Little ?Help needed moving from lying on your back to sitting on the side of a flat bed without using bedrails?: A Little ?Help needed moving to and from a bed to a chair (including a wheelchair)?: A Lot ?Help needed standing up from a chair using your arms (e.g., wheelchair or bedside chair)?: A Lot ?Help needed to walk in hospital room?: A Little ?Help needed climbing 3-5 steps with a railing? : Total ?6 Click Score: 14 ? ?  ?End of Session Equipment Utilized During Treatment: Gait belt ?Activity Tolerance: Patient tolerated treatment well ?Patient  left: in chair;with call bell/phone within reach;with chair alarm set;with family/visitor present ?Nurse Communication: Mobility status ?PT Visit Diagnosis: History of falling (Z91.81);Pain;Muscle weakness (generalized) (M62.81);Difficulty in walking, not elsewhere classified (R26.2) ?  ? ? ?Time: 5525-8948 ?PT Time Calculation (min) (ACUTE ONLY): 26 min ? ?Charges:  $Gait Training: 8-22 mins ?$Therapeutic Activity: 8-22 mins          ?          ? ?Gwynneth Albright PT, DPT ?Acute Rehabilitation Services ?Office [phone] ?Pager [phone]  ? ? ?Jacques Navy ?02/11/2022, 12:25 PM ? ?

## 2022-02-12 ENCOUNTER — Inpatient Hospital Stay (HOSPITAL_COMMUNITY): Payer: Medicare Other

## 2022-02-12 LAB — BASIC METABOLIC PANEL
Anion gap: 7 (ref 5–15)
BUN: 17 mg/dL (ref 8–23)
CO2: 24 mmol/L (ref 22–32)
Calcium: 8.6 mg/dL — ABNORMAL LOW (ref 8.9–10.3)
Chloride: 106 mmol/L (ref 98–111)
Creatinine, Ser: 0.72 mg/dL (ref 0.44–1.00)
GFR, Estimated: >60 mL/min (ref >60)
Glucose, Bld: 88 mg/dL (ref 70–99)
Potassium: 3.9 mmol/L (ref 3.5–5.1)
Sodium: 137 mmol/L (ref 135–145)

## 2022-02-12 LAB — URINE CULTURE: Culture: >=100000 — AB

## 2022-02-12 MED ORDER — GUAIFENESIN 100 MG/5ML PO LIQD
5.0000 mL | Freq: Two times a day (BID) | ORAL | Status: DC
  Administered 2022-02-12 – 2022-02-15 (×5): 5 mL via ORAL
  Filled 2022-02-12 (×5): qty 10

## 2022-02-12 MED ORDER — GUAIFENESIN ER 600 MG PO TB12: 600.0000 mg | ORAL_TABLET | Freq: Two times a day (BID) | ORAL | Status: DC

## 2022-02-12 MED ORDER — CEPHALEXIN 500 MG PO CAPS
500.0000 mg | ORAL_CAPSULE | Freq: Two times a day (BID) | ORAL | Status: DC
  Administered 2022-02-12 – 2022-02-13 (×2): 500 mg via ORAL
  Filled 2022-02-12 (×2): qty 1

## 2022-02-12 MED ORDER — FUROSEMIDE 10 MG/ML IJ SOLN
20.0000 mg | Freq: Once | INTRAMUSCULAR | Status: AC
  Administered 2022-02-12: 20 mg via INTRAVENOUS
  Filled 2022-02-12: qty 2

## 2022-02-12 NOTE — Progress Notes (Signed)
?PROGRESS NOTE ? ? ? ?Jackie [name]  GBT:[id number] DOB: 1931-12-17 DOA: 02/08/2022 ?PCP: [name], MD  ? ? ? ?Brief Narrative:  ? ?Jackie [name] is a 88 y.o. female with medical history significant of the fracture, depression, insomnia, esophageal diverticulum, anemia, BPPV, diastolic dysfunction, hypertension, hyperlipidemia, peripheral vascular disease, hypothyroidism presenting after a fall at home.  ? ?Subjective: ? ?Uneventful night ?she is pleasant, she is aaox3,   ? ?There is no fever, daughter reported patient started to cough intermittently , daughter reports patient has a history of intermittent cough, she denies chest pain, no sob, no edema, she is on room air ? ? ?Assessment & Plan: ? Principal Problem: ?  Fall ?Active Problems: ?  Depressed mood ?  Insomnia ?  Diastolic dysfunction ?  Essential hypertension ?  Hyperlipidemia ?  Peripheral vascular disease ([place]) ?  Postoperative hypothyroidism ?  Rhabdomyolysis ?  Hallucination ?  Falls ? ? ? ?Assessment and Plan: ? ?Fall /rhabdomyolysis ? >Presenting after fall where she was down to 20 hours at home.  Appears to be mechanical fall the patient is unsure of the events surrounding the fall.  Not sure if she hit her head. ?> Imaging in the ED negative for any acute abnormalities on chest x-ray, x-ray of the L-spine, x-ray of the pelvis, CT head, CT C-spine. ?>Received  IV hydration, CK is trending down, will hold off further hydration, encourage oral intake ?>PT OT eval, Fall precaution, Family now is agreeing with SNF placement ? ?Elevation of troponin ?Likely demand ischemia ?No chest pain ?Follow-up with cardiology outpatient ? ?UTI ?UA trace leuk, positive nitrite ?Urine culture grew pansensitive ecoli ?On  Rocephinx3 days, change to keflex to finish total of 5 days treatment  ?PVR 59cc ? ?Hypothyroidism ?Suppressed tsh, could contribute to anxiety, hallucination ?Discussed with endocrinology Dr Forde Dandy who recommend continue home dose synthroid  as "She has a history of thyroid cancer. Target TSH is 0.2 to 0.5 " ? ? ? ?Depression/insomnia/hallucination/delirium/ likely underline dementia  ?Appreciate Psych input, start with olanzapine qhs, daughter reports olanzapine worked well for her mother would like to continue ?Daughter does not desire neurology referral , she is concerned about neurological evaluation could aggravate her mother, she is also not keen to start dementia medication due to her mother-in-law had side effects from Aricept in the past ?B12 in 200 range, will start b12 supplement to target above 500 ? ?Cough, h/o bronchitis, pna several times before  ?Daughter reports patient has a chronic intermittent cough for years, Mostly after eating or at night ?Does reports h/o acid reflux, today she denies symptom of acid reflux ,  ?Will get CXR ? ?Addendum: cxr with possible basilar atelectasis versus teeny effusions ?Encourage incentive spirometer/out of bed, start Mucinex, low-dose Lasix '20mg'$ x1, daughter updated and agreed ? ? ? Body mass index is 24.58 kg/m?Marland Kitchen. ? ? ?I have Reviewed nursing notes, Vitals, pain scores, I/o's, Lab results and  imaging results since pt's last encounter, details please see discussion above  ?I ordered the following labs:  ?Unresulted Labs (From admission, onward)  ? ?  Start     Ordered  ? 02/15/22 0500  Creatinine, serum  (enoxaparin (LOVENOX)    CrCl >/= 30 ml/min)  Weekly,   R     ?Comments: while on enoxaparin therapy ?  ? 02/08/22 1848  ? 02/13/22 7106  Basic metabolic panel  Tomorrow morning,   R       ? 02/12/22 1559  ? 02/13/22  0500  Magnesium  Tomorrow morning,   STAT       ? 02/12/22 1559  ? ?  ?  ? ?  ? ? ? ?DVT prophylaxis: enoxaparin (LOVENOX) injection 40 mg Start: 02/08/22 2200 ? ? ?Code Status:   Code Status: Full Code ? ?Family Communication: daughter at bedside daily  ?Disposition:  ? ? ?Dispo: The patient is from: home ?             Anticipated d/c is to: SNF              ? Anticipated d/c date is:  medically stable to discharge to snf , awaiting for snf bed ? ?Antimicrobials:   ?Anti-infectives (From admission, onward)  ? ? Start     Dose/Rate Route Frequency Ordered Stop  ? 02/12/22 1330  cephALEXin (KEFLEX) capsule 500 mg       ? 500 mg Oral Every 12 hours 02/12/22 1240 02/15/22 0959  ? 02/09/22 1200  cefTRIAXone (ROCEPHIN) 1 g in sodium chloride 0.9 % 100 mL IVPB  Status:  Discontinued       ?Note to Pharmacy: Start abx after urine culture obtained  ? 1 g ?200 mL/hr over 30 Minutes Intravenous Every 24 hours 02/09/22 1058 02/12/22 1239  ? ?  ? ? ? ? ? ? ?Objective: ?Vitals:  ? 02/11/22 0559 02/11/22 2126 02/12/22 0506 02/12/22 1334  ?BP: 140/61 (!) 150/57 (!) 153/69 (!) 144/63  ?Pulse: 60 66 (!) 49 (!) 57  ?Resp: '17 18 16 '$ (!) 22  ?Temp: 97.8 ?F (36.6 ?C) 97.9 ?F (36.6 ?C) (!) 97.5 ?F (36.4 ?C) 97.8 ?F (36.6 ?C)  ?TempSrc: Oral Axillary Oral Oral  ?SpO2: 98% 95% 96% 98%  ?Weight:      ?Height:      ? ? ?Intake/Output Summary (Last 24 hours) at 02/12/2022 1605 ?Last data filed at 02/12/2022 1336 ?Gross per 24 hour  ?Intake 353 ml  ?Output 850 ml  ?Net -497 ml  ? ?Filed Weights  ? 02/08/22 1533 02/08/22 1547  ?Weight: 59 kg 59 kg  ? ? ?Examination: ? ?General exam: frail, alert, awake, aaox3 ?Respiratory system: Clear to auscultation. Respiratory effort normal. ?Cardiovascular system:  RRR.  ?Gastrointestinal system: Abdomen is nondistended, soft and nontender.  Normal bowel sounds heard. ?Central nervous system: Alert and orientedx3,  No focal neurological deficits. ?Extremities:  no edema ?Skin: No rashes, lesions or ulcers ?Psychiatry: calm and cooperative  ? ? ? ?Data Reviewed: I have personally reviewed  labs and visualized  imaging studies since the last encounter and formulate the plan  ? ? ? ? ? ? ?Scheduled Meds: ? aspirin EC  81 mg Oral Daily  ? cephALEXin  500 mg Oral Q12H  ? cholecalciferol  400 Units Oral Daily  ? enoxaparin (LOVENOX) injection  40 mg Subcutaneous Q24H  ? guaiFENesin  5 mL Oral BID   ? levothyroxine  112 mcg Oral Daily  ? OLANZapine  2.5 mg Oral QHS  ? polyethylene glycol  17 g Oral Daily  ? senna-docusate  1 tablet Oral BID  ? sodium chloride flush  3 mL Intravenous Q12H  ? vitamin B-12  1,000 mcg Oral Daily  ? ?Continuous Infusions: ? ? ? ? LOS: 3 days  ? ? ? ? ?Florencia Reasons, MD PhD FACP ?[facility] ? ?Available via Epic secure chat 7am-7pm for nonurgent issues ?Please page for urgent issues ?To page the attending provider between 7A-7P or the covering provider during after hours 7P-7A, please  log into the web site [email or web address] and access using universal [place] password for that web site. If you do not have the password, please call the hospital operator. ? ? ? ?02/12/2022, 4:05 PM  ? ? ?

## 2022-02-12 NOTE — Progress Notes (Signed)
Patient has become agitated about "being left alone and not knowing the place that she is in". Has forgotten parts of earlier discussions between her and RN. Starting to show signs of disorientation to place and paranoia. Have tried to redirect her about earlier conversations, but she is only getting more agitated.Will continue to monitor.Jackie Post, RN  ?

## 2022-02-12 NOTE — TOC Progression Note (Signed)
Transition of Care (TOC) - Progression Note  ? ? ?Patient Details  ?Name: JILENE SPOHR ?MRN: [id number] ?Date of Birth: [birth year] ? ?Transition of Care (TOC) CM/SW Contact  ?Tawanna Cooler, RN ?Phone Number: ?02/12/2022, 5:02 PM ? ?Clinical Narrative:    ? ?PT/OT recommend SNF.  Family and patient agreeable.  FL2 sent.  Pending bed offers.  ? ?Expected Discharge Plan: Home/Self Care ?Barriers to Discharge: Continued Medical Work up ? ?Expected Discharge Plan and Services ?Expected Discharge Plan: Home/Self Care ?  ?  ?

## 2022-02-12 NOTE — [place] FL2 (Signed)
?[place] MEDICAID FL2 LEVEL OF CARE SCREENING TOOL  ?  ? ?IDENTIFICATION  ?Patient Name: ?Jackie [name] Birthdate: [birth year] Sex: female Admission Date (Current Location): ?02/08/2022  ?[place] and [place] Number: ? [place] ?  Facility and Address:  ?[facility],  [facility], [place] ?     Provider Number: ?[id number]  ?Attending Physician Name and Address:  ?Florencia Reasons, MD ? Relative Name and Phone Number:  ?Gordy Savers, daughter, [phone]; Wilhelmenia Addis, son, [phone] ?   ?Current Level of Care: ?Hospital Recommended Level of Care: ?[place] Prior Approval Number: ?  ? ?Date Approved/Denied: ?10/13/14 PASRR Number: ?[id number] ? ?Discharge Plan: ?SNF ?  ? ?Current Diagnoses: ?Patient Active Problem List  ? Diagnosis Date Noted  ? Falls 02/09/2022  ? Diastolic dysfunction 26/71/2458  ? Peripheral vascular disease ([place]) 02/08/2022  ? Fall 02/08/2022  ? Rhabdomyolysis 02/08/2022  ? Hallucination 02/08/2022  ? Dermatitis 03/22/2021  ? Local infection of skin and subcutaneous tissue 09/25/2020  ? Pain due to onychomycosis of toenails of both feet 03/10/2020  ? Essential hypertension 07/03/2018  ? Benign paroxysmal positional vertigo 07/15/2015  ? Acquired diverticulum of esophagus 06/25/2015  ? Postoperative hypothyroidism 06/25/2015  ? Anemia due to chronic blood loss 11/17/2014  ? CN (constipation) 10/20/2014  ? Depressed mood 10/20/2014  ? Insomnia 10/20/2014  ? Femoral neck fracture ([place]) 10/11/2014  ? Hip fracture ([place]) 10/11/2014  ? Hyperlipidemia 12/30/2009  ? ? ?Orientation RESPIRATION BLADDER Height & Weight   ?  ?Self, Place ? Normal Indwelling catheter Weight: 59 kg ?Height:  '5\' 1"'$  (154.9 cm)  ?BEHAVIORAL SYMPTOMS/MOOD NEUROLOGICAL BOWEL NUTRITION STATUS  ?Other (Comment) (sometimes confused at night)   Continent Diet (Heart healthy)  ?AMBULATORY STATUS COMMUNICATION OF NEEDS Skin   ?Limited Assist Verbally Bruising ?  ?  ?  ?    ?     ?      ? ? ?Personal Care Assistance Level of Assistance  ?Bathing, Feeding, Dressing Bathing Assistance: Limited assistance ?Feeding assistance: Limited assistance ?Dressing Assistance: Limited assistance ?   ? ?Functional Limitations Info  ?Sight, Hearing, Speech Sight Info: Adequate ?Hearing Info: Adequate ?Speech Info: Adequate  ? ? ?SPECIAL CARE FACTORS FREQUENCY  ?PT (By licensed PT), OT (By licensed OT)   ?  ?PT Frequency: 5x/week ?OT Frequency: 5x/week ?  ?  ?  ?   ? ? ?Contractures Contractures Info: Not present  ? ? ?Additional Factors Info  ?Code Status, Allergies Code Status Info: Full ?Allergies Info: codeine, azithromycin, erythromycin base ?  ?  ?  ?   ? ?Current Medications (02/12/2022):  This is the current hospital active medication list ?Current Facility-Administered Medications  ?Medication Dose Route Frequency Provider Last Rate Last Admin  ? acetaminophen (TYLENOL) tablet 650 mg  650 mg Oral Q6H PRN Marcelyn Bruins, MD   650 mg at 02/12/22 0550  ? Or  ? acetaminophen (TYLENOL) suppository 650 mg  650 mg Rectal Q6H PRN Marcelyn Bruins, MD      ? aspirin EC tablet 81 mg  81 mg Oral Daily Marcelyn Bruins, MD   81 mg at 02/12/22 1002  ? cephALEXin (KEFLEX) capsule 500 mg  500 mg Oral Q12H Florencia Reasons, MD   500 mg at 02/12/22 1600  ? cholecalciferol (VITAMIN D3) tablet 400 Units  400 Units Oral Daily Florencia Reasons, MD   400 Units at 02/12/22 1023  ? enoxaparin (LOVENOX) injection 40 mg  40 mg Subcutaneous Q24H Trilby Drummer,  Candace Gallus, MD   40 mg at 02/11/22 2120  ? guaiFENesin (ROBITUSSIN) 100 MG/5ML liquid 5 mL  5 mL Oral BID Florencia Reasons, MD   5 mL at 02/12/22 1600  ? levothyroxine (SYNTHROID) tablet 112 mcg  112 mcg Oral Daily Florencia Reasons, MD   112 mcg at 02/12/22 0543  ? OLANZapine (ZYPREXA) tablet 2.5 mg  2.5 mg Oral QHS Florencia Reasons, MD   2.5 mg at 02/11/22 2120  ? polyethylene glycol (MIRALAX / GLYCOLAX) packet 17 g  17 g Oral Daily Florencia Reasons, MD   17 g at 02/12/22 1004  ? senna-docusate (Senokot-S) tablet 1  tablet  1 tablet Oral BID Florencia Reasons, MD   1 tablet at 02/12/22 1002  ? sodium chloride flush (NS) 0.9 % injection 3 mL  3 mL Intravenous Q12H Marcelyn Bruins, MD   3 mL at 02/12/22 1000  ? vitamin B-12 (CYANOCOBALAMIN) tablet 1,000 mcg  1,000 mcg Oral Daily Florencia Reasons, MD   1,000 mcg at 02/12/22 1002  ? ? ? ?Discharge Medications: ?Please see discharge summary for a list of discharge medications. ? ?Relevant Imaging Results: ? ?Relevant Lab Results: ? ? ?Additional Information ?SS# [id number]; covid vaccines - Pfizer x2 ? ?Tawanna Cooler, RN ? ? ? ? ?

## 2022-02-13 LAB — BASIC METABOLIC PANEL
Anion gap: 9 (ref 5–15)
BUN: 16 mg/dL (ref 8–23)
CO2: 26 mmol/L (ref 22–32)
Calcium: 8.8 mg/dL — ABNORMAL LOW (ref 8.9–10.3)
Chloride: 103 mmol/L (ref 98–111)
Creatinine, Ser: 0.73 mg/dL (ref 0.44–1.00)
GFR, Estimated: >60 mL/min (ref >60)
Glucose, Bld: 86 mg/dL (ref 70–99)
Potassium: 3.6 mmol/L (ref 3.5–5.1)
Sodium: 138 mmol/L (ref 135–145)

## 2022-02-13 LAB — MAGNESIUM: Magnesium: 2.1 mg/dL (ref 1.7–2.4)

## 2022-02-13 MED ORDER — CEPHALEXIN 500 MG PO CAPS
500.0000 mg | ORAL_CAPSULE | Freq: Two times a day (BID) | ORAL | Status: AC
  Administered 2022-02-13: 500 mg via ORAL
  Filled 2022-02-13: qty 1

## 2022-02-13 MED ORDER — POTASSIUM CHLORIDE 20 MEQ PO PACK
40.0000 meq | PACK | Freq: Once | ORAL | Status: AC
  Administered 2022-02-13: 40 meq via ORAL
  Filled 2022-02-13: qty 2

## 2022-02-13 NOTE — Progress Notes (Addendum)
?PROGRESS NOTE ? ? ? ?Jackie [name]  JYN:[id number] DOB: August 04, 1932 DOA: 02/08/2022 ?PCP: [name], MD  ? ? ? ?Brief Narrative:  ? ?Jackie [name] is a 88 y.o. female with medical history significant of the fracture, depression, insomnia, esophageal diverticulum, anemia, BPPV, diastolic dysfunction, hypertension, hyperlipidemia, peripheral vascular disease, hypothyroidism presenting after a fall at home.  ? ?Subjective: ? ?Uneventful night ?she is pleasant, she is aaox3,   ? ?Assessment & Plan: ? Principal Problem: ?  Fall ?Active Problems: ?  Depressed mood ?  Insomnia ?  Diastolic dysfunction ?  Essential hypertension ?  Hyperlipidemia ?  Peripheral vascular disease ([place]) ?  Postoperative hypothyroidism ?  Rhabdomyolysis ?  Hallucination ?  Falls ? ? ? ?Assessment and Plan: ? ?Fall /rhabdomyolysis ? >Presenting after fall where she was down to 20 hours at home.  Appears to be mechanical fall the patient is unsure of the events surrounding the fall.  Not sure if she hit her head. ?> Imaging in the ED negative for any acute abnormalities on chest x-ray, x-ray of the L-spine, x-ray of the pelvis, CT head, CT C-spine. ?>Received  IV hydration, CK is trending down, will hold off further hydration, encourage oral intake ?>PT OT eval, Fall precaution, Family now is agreeing with SNF placement ? ? ?Ecoli UTI, present on admission ?Urine culture grew pansensitive ecoli ?On Rocephinx3 days, change to keflex to finish total of 5 days treatment  ?PVR 59cc ? ?Elevation of troponin ?Likely demand ischemia ?No chest pain ?Follow-up with cardiology outpatient ? ?Hypothyroidism ?Suppressed tsh, could contribute to anxiety, hallucination ?Discussed with endocrinology Dr Forde Dandy who recommend continue home dose synthroid as "She has a history of thyroid cancer. Target TSH is 0.2 to 0.5 " ? ? ? ?Depression/insomnia/hallucination/delirium/ likely underline dementia  ?Appreciate Psych input, start with olanzapine qhs, daughter  reports olanzapine worked well for her mother would like to continue ?Daughter does not desire neurology referral , she is concerned about neurological evaluation could aggravate her mother, she is also not keen to start dementia medication due to her mother-in-law had side effects from Aricept in the past ?B12 in 200 range, started b12 supplement to target above 500 ? ?Cough, h/o bronchitis, pna several times before  ?Daughter reports patient has a chronic intermittent cough for years, Mostly after eating or at night ?Does reports h/o acid reflux, today she denies symptom of acid reflux ,  ? cxr with possible basilar atelectasis versus teeny effusions ?Encourage incentive spirometer/out of bed, start Mucinex, low-dose Lasix '20mg'$ x1 ?Improved  ? ? ? Body mass index is 24.58 kg/m?Marland Kitchen. ? ? ?I have Reviewed nursing notes, Vitals, pain scores, I/o's, Lab results and  imaging results since pt's last encounter, details please see discussion above  ?I ordered the following labs:  ?Unresulted Labs (From admission, onward)  ? ?  Start     Ordered  ? 02/15/22 0500  Creatinine, serum  (enoxaparin (LOVENOX)    CrCl >/= 30 ml/min)  Weekly,   R     ?Comments: while on enoxaparin therapy ?  ? 02/08/22 1848  ? ?  ?  ? ?  ? ? ? ?DVT prophylaxis: enoxaparin (LOVENOX) injection 40 mg Start: 02/08/22 2200 ? ? ?Code Status:   Code Status: Full Code ? ?Family Communication: daughter at bedside daily  ?Disposition:  ? ? ?Dispo: The patient is from: home ?             Anticipated d/c is to: SNF , recommend  palliative care follow patient at snf             ? Anticipated d/c date is: medically stable to discharge to snf , awaiting for snf bed ? ?Antimicrobials:   ?Anti-infectives (From admission, onward)  ? ? Start     Dose/Rate Route Frequency Ordered Stop  ? 02/13/22 1800  cephALEXin (KEFLEX) capsule 500 mg       ? 500 mg Oral Every 12 hours 02/13/22 1029 02/14/22 1759  ? 02/12/22 1330  cephALEXin (KEFLEX) capsule 500 mg  Status:   Discontinued       ? 500 mg Oral Every 12 hours 02/12/22 1240 02/13/22 1029  ? 02/09/22 1200  cefTRIAXone (ROCEPHIN) 1 g in sodium chloride 0.9 % 100 mL IVPB  Status:  Discontinued       ?Note to Pharmacy: Start abx after urine culture obtained  ? 1 g ?200 mL/hr over 30 Minutes Intravenous Every 24 hours 02/09/22 1058 02/12/22 1239  ? ?  ? ? ? ? ? ? ?Objective: ?Vitals:  ? 02/12/22 0506 02/12/22 1334 02/12/22 1950 02/13/22 0319  ?BP: (!) 153/69 (!) 144/63 (!) 158/71 (!) 149/69  ?Pulse: (!) 49 (!) 57 64 (!) 57  ?Resp: 16 (!) '22 20 20  '$ ?Temp: (!) 97.5 ?F (36.4 ?C) 97.8 ?F (36.6 ?C) 98.1 ?F (36.7 ?C) 98.2 ?F (36.8 ?C)  ?TempSrc: Oral Oral Oral Oral  ?SpO2: 96% 98% 95% 98%  ?Weight:      ?Height:      ? ? ?Intake/Output Summary (Last 24 hours) at 02/13/2022 1035 ?Last data filed at 02/13/2022 0320 ?Gross per 24 hour  ?Intake --  ?Output 1500 ml  ?Net -1500 ml  ? ?Filed Weights  ? 02/08/22 1533 02/08/22 1547  ?Weight: 59 kg 59 kg  ? ? ?Examination: ? ?General exam: frail, alert, awake, aaox3 ?Respiratory system: Clear to auscultation. Respiratory effort normal. ?Cardiovascular system:  RRR.  ?Gastrointestinal system: Abdomen is nondistended, soft and nontender.  Normal bowel sounds heard. ?Central nervous system: Alert and orientedx3,  No focal neurological deficits. ?Extremities:  no edema ?Skin: No rashes, lesions or ulcers ?Psychiatry: calm and cooperative  ? ? ? ?Data Reviewed: I have personally reviewed  labs and visualized  imaging studies since the last encounter and formulate the plan  ? ? ? ? ? ? ?Scheduled Meds: ? aspirin EC  81 mg Oral Daily  ? cephALEXin  500 mg Oral Q12H  ? cholecalciferol  400 Units Oral Daily  ? enoxaparin (LOVENOX) injection  40 mg Subcutaneous Q24H  ? guaiFENesin  5 mL Oral BID  ? levothyroxine  112 mcg Oral Daily  ? OLANZapine  2.5 mg Oral QHS  ? polyethylene glycol  17 g Oral Daily  ? senna-docusate  1 tablet Oral BID  ? sodium chloride flush  3 mL Intravenous Q12H  ? vitamin B-12  1,000  mcg Oral Daily  ? ?Continuous Infusions: ? ? ? ? LOS: 4 days  ? ? ? ? ?Florencia Reasons, MD PhD FACP ?[facility] ? ?Available via Epic secure chat 7am-7pm for nonurgent issues ?Please page for urgent issues ?To page the attending provider between 7A-7P or the covering provider during after hours 7P-7A, please log into the web site [email or web address] and access using universal [place] password for that web site. If you do not have the password, please call the hospital operator. ? ? ? ?02/13/2022, 10:35 AM  ? ? ?

## 2022-02-13 NOTE — Plan of Care (Signed)
  Problem: Education: Goal: Knowledge of General Education information will improve Description Including pain rating scale, medication(s)/side effects and non-pharmacologic comfort measures Outcome: Progressing   Problem: Health Behavior/Discharge Planning: Goal: Ability to manage health-related needs will improve Outcome: Progressing   

## 2022-02-13 NOTE — Progress Notes (Signed)
Occupational Therapy Treatment ?Patient Details ?Name: Jackie [name] ?MRN: [id number] ?DOB: 1932/08/29 ?Today's Date: 02/13/2022 ? ? ?History of present illness 88 yo female admitted with rhabdomyolysis, increased troponin after a fall at home.Hx of falls, L hip fx s/p THA 2015, pelvic fracture, T spine fx. ?  ?OT comments ? Patient is a 88 year old female who was noted to make good progress in session on this date with patient able to participate in functional mobility to bathroom with RW with increased time and mod A for sit to stand with posterior leaning noted. Sit to stands were worked on during session with patient able to progress to min guard with no posterior leaning noted with sequencing cues. Jackie [name] OTR/L, MS ?Acute Rehabilitation Department ?Office# [phone] ?Pager# [phone] ?  ? ?Recommendations for follow up therapy are one component of a multi-disciplinary discharge planning process, led by the attending physician.  Recommendations may be updated based on patient status, additional functional criteria and insurance authorization. ?   ?Follow Up Recommendations ? Skilled nursing-short term rehab (<3 hours/day)  ?  ?Assistance Recommended at Discharge Frequent or constant Supervision/Assistance  ?Patient can return home with the following ? A lot of help with walking and/or transfers;A lot of help with bathing/dressing/bathroom;Assistance with cooking/housework;Direct supervision/assist for financial management;Assist for transportation;Help with stairs or ramp for entrance;Direct supervision/assist for medications management ?  ?Equipment Recommendations ? Other (comment) (defer to next venue)  ?  ?Recommendations for Other Services   ? ?  ?Precautions / Restrictions Precautions ?Precautions: Fall ?Restrictions ?Weight Bearing Restrictions: No  ? ? ?  ? ?Mobility Bed Mobility ?  ?  ?  ?  ?  ?  ?  ?General bed mobility comments: patient was sitting in recliner at start of session and returned  to same ?  ? ?Transfers ?  ?  ?  ?  ?  ?  ?  ?  ?  ?  ?  ?  ?Balance   ?  ?  ?  ?  ?  ?  ?  ?  ?  ?  ?  ?  ?  ?  ?  ?  ?  ?  ?   ? ?ADL either performed or assessed with clinical judgement  ? ?ADL Overall ADL's : Needs assistance/impaired ?  ?  ?  ?  ?  ?  ?  ?  ?  ?  ?  ?  ?Toilet Transfer: Minimal assistance;Rolling walker (2 wheels);Regular Toilet;Ambulation ?Toilet Transfer Details (indicate cue type and reason): patient was able to complete functional mobility after intial mod A for sit to stand balance with posterior leaning noted. patient was educated on avoiding posterior leaning and leaning forwards. patient verbalized fear of falling. patient was educated that fears were valid and on strategies on how to manage fears. patient was able to transfer on and off commode with mod A with min guard with RW between ambulation. ?  ?  ?  ?  ?Functional mobility during ADLs: Min guard;Rolling walker (2 wheels) ?General ADL Comments: with cues in room to reach goal and avoid obstacles ?  ? ?Extremity/Trunk Assessment   ?  ?  ?  ?  ?  ? ?Vision   ?  ?  ?Perception   ?  ?Praxis   ?  ? ?Cognition Arousal/Alertness: Awake/alert ?Behavior During Therapy: [facility] for tasks assessed/performed ?Overall Cognitive Status: Within Functional Limits for tasks assessed ?  ?  ?  ?  ?  ?  ?  ?  ?  ?  ?  ?  ?  ?  ?  ?  ?  General Comments: patient was motivated to participate in session with some memory deficits noted during session ?  ?  ?   ?Exercises Other Exercises ?Other Exercises: patient was educated on sit to stand and stand to sit and strategies to aboid posterior leaning during transitions. patient was min guard with consistent verbal cues for 6 reps to sequence task. ? ?  ?Shoulder Instructions   ? ? ?  ?General Comments    ? ? ?Pertinent Vitals/ Pain       Pain Assessment ?Pain Assessment: No/denies pain ? ?Home Living   ?  ?  ?  ?  ?  ?  ?  ?  ?  ?  ?  ?  ?  ?  ?  ?  ?  ?  ? ?  ?Prior Functioning/Environment    ?  ?  ?  ?    ? ?Frequency ? Min 2X/week  ? ? ? ? ?  ?Progress Toward Goals ? ?OT Goals(current goals can now be found in the care plan section) ? Progress towards OT goals: Progressing toward goals ? ?   ?Plan Discharge plan remains appropriate   ? ?Co-evaluation ? ? ?   ?  ?  ?  ?  ? ?  ?AM-PAC OT "6 Clicks" Daily Activity     ?Outcome Measure ? ? Help from another person eating meals?: A Little ?Help from another person taking care of personal grooming?: A Little ?Help from another person toileting, which includes using toliet, bedpan, or urinal?: A Little ?Help from another person bathing (including washing, rinsing, drying)?: A Lot ?Help from another person to put on and taking off regular upper body clothing?: A Little ?Help from another person to put on and taking off regular lower body clothing?: A Lot ?6 Click Score: 16 ? ?  ?End of Session Equipment Utilized During Treatment: Rolling walker (2 wheels);Gait belt ? ?OT Visit Diagnosis: Unsteadiness on feet (R26.81);Other abnormalities of gait and mobility (R26.89);History of falling (Z91.81) ?  ?Activity Tolerance Patient tolerated treatment well ?  ?Patient Left in chair;with call bell/phone within reach;with nursing/sitter in room;with family/visitor present;with chair alarm set ?  ?Nurse Communication Other (comment) (ok to participate in therapy.) ?  ? ?   ? ?Time: 5465-0354 ?OT Time Calculation (min): 27 min ? ?Charges: OT Treatments ?$Self Care/Home Management : 23-37 mins ? ?Jackie [name] OTR/L, MS ?Acute Rehabilitation Department ?Office# [phone] ?Pager# [phone] ? ? ?[place] ?02/13/2022, 2:58 PM ?

## 2022-02-14 DIAGNOSIS — W19XXXD Unspecified fall, subsequent encounter: Secondary | ICD-10-CM

## 2022-02-14 DIAGNOSIS — T796XXD Traumatic ischemia of muscle, subsequent encounter: Secondary | ICD-10-CM

## 2022-02-14 MED ORDER — ASPIRIN 81 MG PO TABS: 81.0000 mg | ORAL_TABLET | Freq: Every day | ORAL | Status: AC

## 2022-02-14 MED ORDER — CHOLECALCIFEROL 10 MCG (400 UNIT) PO TABS: 400.0000 [IU] | ORAL_TABLET | Freq: Every day | ORAL | Status: AC

## 2022-02-14 MED ORDER — CYANOCOBALAMIN 1000 MCG PO TABS: 1000.0000 ug | ORAL_TABLET | Freq: Every day | ORAL | Status: AC

## 2022-02-14 MED ORDER — OLANZAPINE 2.5 MG PO TABS: 2.5000 mg | ORAL_TABLET | Freq: Every day | ORAL | Status: AC

## 2022-02-14 NOTE — Progress Notes (Signed)
Physical Therapy Treatment ?Patient Details ?Name: Jackie [name] ?MRN: [id number] ?DOB: 12-02-1931 ?Today's Date: 02/14/2022 ? ? ?History of Present Illness 88 yo female admitted with rhabdomyolysis, increased troponin after a fall at home.Hx of falls, L hip fx s/p THA 2015, pelvic fracture, T spine fx. ? ?  ?PT Comments  ? ? Pt progressing well with PT. Very motivated to mobilize. Incr amb distance and tolerance, decr assist needed with sit>stands. Continue to recommend SNF   ?Recommendations for follow up therapy are one component of a multi-disciplinary discharge planning process, led by the attending physician.  Recommendations may be updated based on patient status, additional functional criteria and insurance authorization. ? ?Follow Up Recommendations ? Skilled nursing-short term rehab (<3 hours/day) ?  ?  ?Assistance Recommended at Discharge Frequent or constant Supervision/Assistance  ?Patient can return home with the following Assistance with cooking/housework;Assist for transportation;Help with stairs or ramp for entrance;A lot of help with walking and/or transfers;A lot of help with bathing/dressing/bathroom ?  ?Equipment Recommendations ? Other (comment) (defer to SNF)  ?  ?Recommendations for Other Services   ? ? ?  ?Precautions / Restrictions Precautions ?Precautions: Fall ?Restrictions ?Weight Bearing Restrictions: No  ?  ? ?Mobility ? Bed Mobility ?  ?  ?  ?  ?  ?  ?  ?General bed mobility comments: in recliner on arrival ?  ? ?Transfers ?Overall transfer level: Needs assistance ?Equipment used: Rolling walker (2 wheels) ?Transfers: Sit to/from Stand ?Sit to Stand: Min assist ?  ?  ?  ?  ?  ?General transfer comment: Min assist for power up from recliner .pt with slight posterior lean in standing and manual/tactile cues at back to shift anteriorly. verbal cues for hand placement and control of desecent ?  ? ?Ambulation/Gait ?Ambulation/Gait assistance: Min guard ?Gait Distance (Feet): 90 Feet  (x2) ?Assistive device: Rolling walker (2 wheels) ?Gait Pattern/deviations: Step-through pattern, Decreased stride length, Trunk flexed ?Gait velocity: decr ?  ?  ?General Gait Details: cues for safe proximity to RW at start and pt maintianed throughout. pt with flexed posture due to significant kyphosis and forward neck posture to compensate. multi-modal cues for trunk extension adn upward gaze as tolerated. min/guard for safety. pt with slow and unsteady gait however no overt LOB ? ? ?Stairs ?  ?  ?  ?  ?  ? ? ?Wheelchair Mobility ?  ? ?Modified Rankin (Stroke Patients Only) ?  ? ? ?  ?Balance Overall balance assessment: Needs assistance, History of Falls ?Sitting-balance support: Feet supported, Bilateral upper extremity supported ?Sitting balance-Leahy Scale: Fair ?  ?  ?Standing balance support: Bilateral upper extremity supported, During functional activity, Reliant on assistive device for balance ?Standing balance-Leahy Scale: Poor ?Standing balance comment: posterior lean improved with cues for anterior weight shift. ?  ?  ?  ?  ?  ?  ?  ?  ?  ?  ?  ?  ? ?  ?Cognition Arousal/Alertness: Awake/alert ?Behavior During Therapy: [facility] for tasks assessed/performed ?Overall Cognitive Status: Within Functional Limits for tasks assessed ?  ?  ?  ?  ?  ?  ?  ?  ?  ?  ?  ?  ?  ?  ?  ?  ?General Comments: patient was motivated to participate in session with some memory deficits noted during session ?  ?  ? ?  ?Exercises Other Exercises ?Other Exercises: instructed in gentle cervical ROM, head turns, chin tucks and shoulder depression ? ?  ?General  Comments   ?  ?  ? ?Pertinent Vitals/Pain Pain Assessment ?Pain Assessment: Faces ?Faces Pain Scale: Hurts little more ?Pain Location: cervical region ?Pain Descriptors / Indicators: Discomfort, Sore, Aching ?Pain Intervention(s): Limited activity within patient's tolerance, Monitored during session, Premedicated before session, Repositioned  ? ? ?Home Living   ?  ?  ?  ?  ?  ?   ?  ?  ?  ?   ?  ?Prior Function    ?  ?  ?   ? ?PT Goals (current goals can now be found in the care plan section) Acute Rehab PT Goals ?Patient Stated Goal: less pain. regain PLOF/independence ?PT Goal Formulation: With patient/family ?Time For Goal Achievement: 02/23/22 ?Potential to Achieve Goals: Good ?Progress towards PT goals: Progressing toward goals ? ?  ?Frequency ? ? ? Min 3X/week ? ? ? ?  ?PT Plan Discharge plan needs to be updated  ? ? ?Co-evaluation   ?  ?  ?  ?  ? ?  ?AM-PAC PT "6 Clicks" Mobility   ?Outcome Measure ? Help needed turning from your back to your side while in a flat bed without using bedrails?: A Little ?Help needed moving from lying on your back to sitting on the side of a flat bed without using bedrails?: A Little ?Help needed moving to and from a bed to a chair (including a wheelchair)?: A Little ?Help needed standing up from a chair using your arms (e.g., wheelchair or bedside chair)?: A Little ?Help needed to walk in hospital room?: A Little ?  ?6 Click Score: 15 ? ?  ?End of Session Equipment Utilized During Treatment: Gait belt ?Activity Tolerance: Patient tolerated treatment well ?Patient left: in chair;with call bell/phone within reach;with chair alarm set;with family/visitor present ?Nurse Communication: Mobility status ?PT Visit Diagnosis: History of falling (Z91.81);Muscle weakness (generalized) (M62.81);Difficulty in walking, not elsewhere classified (R26.2) ?  ? ? ?Time: 3614-4315 ?PT Time Calculation (min) (ACUTE ONLY): 20 min ? ?Charges:  $Gait Training: 8-22 mins          ?          ? Jackie [name], PT ? ?Acute Rehab Dept [facility]) [phone] ?Pager [phone] ? ?02/14/2022 ? ? ? ? ?Jackie [name] ?02/14/2022, 4:38 PM ? ?

## 2022-02-14 NOTE — Discharge Instructions (Signed)

## 2022-02-14 NOTE — TOC Progression Note (Signed)
Transition of Care (TOC) - Progression Note  ? ? ?Patient Details  ?Name: Jackie [name] ?MRN: [id number] ?Date of Birth: 05-Sep-1932 ? ?Transition of Care (TOC) CM/SW Contact  ?Emelio Schneller, LCSW ?Phone Number: ?02/14/2022, 3:13 PM ? ?Clinical Narrative:    ?Have reviewed SNF bed offers with pt and daughter and they have [facility] [place] who can admit pt tomorrow.  Have begun insurance authorization.  Alerting tx team. ? ? ?Expected Discharge Plan: Home/Self Care ?Barriers to Discharge: Continued Medical Work up ? ?Expected Discharge Plan and Services ?Expected Discharge Plan: Home/Self Care ?  ?  ?  ?  ?                ?  ?  ?  ?  ?  ?  ?  ?  ?  ?  ? ? ?Social Determinants of Health (SDOH) Interventions ?  ? ?Readmission Risk Interventions ?No flowsheet data found. ? ?

## 2022-02-14 NOTE — Discharge Summary (Addendum)
Physician Discharge Summary  ?Jackie [name] GYI:[id number] DOB: 1931-12-19 ? ?PCP: [name], MD ? ?Admitted from: Home ?Discharged to: SNF ? ?Admit date: 02/08/2022 ?Discharge date: 02/15/2022 ? ?Recommendations for Outpatient Follow-up:  ? ? Contact information for follow-up providers   ? ? [name], MD. Schedule an appointment as soon as possible for a visit in 1 week(s).   ?Specialty: Endocrinology ?Why: For follow-up postdischarge from SNF.  Hospital discharge follow up ?Contact information: ?[address] ?[place] [address] ?[phone] ? ? ?  ?  ? ? Care, [facility] Follow up.   ?Specialty: Home Health Services ?Why: Alvis Lemmings will call you to arrange a visit. ?Contact information: ?[address] ?[address] ?[place] [address] ?[phone] ? ? ?  ?  ? ? MD at SNF Follow up.   ?Why: To be seen in 3 to 4 days with repeat labs (CBC & BMP). ? ?  ?  ? ?  ?  ? ? Contact information for after-discharge care   ? ? Destination   ? ? [facility] .   ?Service: Skilled Nursing ?Contact information: ?[address] ?[place] ?[phone] ? ?  ?  ? ?  ?  ? ?  ?  ? ?  ? ? ? ?Home Health: None ?  ? ?Equipment/Devices: TBD at SNF ?  ? ?Discharge Condition: Improved and stable. ?  Code Status: Full Code ?Diet recommendation:  ?Discharge Diet Orders (From admission, onward)  ? ?  Start     Ordered  ? 02/14/22 0000  Diet - low sodium heart healthy       ? 02/14/22 1650  ? ?  ?  ? ?  ?  ? ?Discharge Diagnoses:  ?Principal Problem: ?  Fall ?Active Problems: ?  Depressed mood ?  Insomnia ?  Diastolic dysfunction ?  Essential hypertension ?  Hyperlipidemia ?  Peripheral vascular disease ([place]) ?  Postoperative hypothyroidism ?  Rhabdomyolysis ?  Hallucination ?  Falls ? ? ?Brief Summary: ?88 year old female with medical history significant for fracture, depression, insomnia, esophageal diverticulum, anemia, BPPV, diastolic dysfunction, hypertension, hyperlipidemia,  peripheral vascular disease, post thyroidectomy hypothyroidism, presented to the ED after a fall at home.  She reportedly was down for 20 hours.  Admitted for rhabdomyolysis and E. coli UTI. ? ?Assessment and Plan: ?  ?Fall /rhabdomyolysis ? >Presenting after fall where she was down to 20 hours at home.  Appears to be mechanical fall, the patient is unsure of the events surrounding the fall.  Not sure if she hit her head. ?> Imaging in the ED negative for any acute abnormalities on chest x-ray, x-ray of the L-spine, x-ray of the pelvis, CT head, CT C-spine. ?>Received  IV hydration, CK trended down from a peak of 2125 to 409. ?> PT and OT evaluated, recommended SNF and patient will be discharging to SNF today. ?> Etiology of fall may be multifactorial related to very advanced age, overall frailty, associated urinary tract infection. ?  ?Ecoli UTI, present on admission ?Urine culture grew pansensitive ecoli ?Completed course of antibiotics including 3 days of IV ceftriaxone and 2 days of Keflex. ?PVR 59cc ?  ?Elevation of troponin ?Likely demand ischemia and from rhabdomyolysis related to fall.  ?No chest pain.  Troponin peaked to 702 and then down trended to 543. ?Consider outpatient 2D echo and cardiology consultation, depending on how aggressive patient/family wish to be. ?Continue home dose of aspirin for both elevated troponin and history  of PAD. ?  ?Hypothyroidism, post thyroidectomy ?Suppressed tsh, could contribute to anxiety, hallucination ?Prior [facility] MD discussed with endocrinology Dr Forde Dandy who recommended continue home dose synthroid as "She has a history of thyroid cancer. Target TSH is 0.2 to 0.5 " ?Outpatient follow-up.  ?TSH 0.186 on 3/15. ?  ?Depression/insomnia/hallucination/delirium/ likely underline dementia  ?Appreciate Psych input, started with olanzapine qhs, daughter reports olanzapine worked well for her mother would like to continue ?Daughter does not desire neurology referral , she is  concerned about neurological evaluation could aggravate her mother, she is also not keen to start dementia medication due to her mother-in-law had side effects from Aricept in the past ?B12 in 200 range, started b12 supplement to target above 500.  Follow B12 levels in the next couple of months. ?  ?Cough, h/o bronchitis, pna several times before  ?Daughter reports patient has a chronic intermittent cough for years, Mostly after eating or at night ?Does reports h/o acid reflux, but denies ongoing symptom of acid reflux ,  ? cxr with possible basilar atelectasis versus tiny effusions ?Encourage incentive spirometer/out of bed, low-dose Lasix '20mg'$ x1 ?Improved  ?  ?Body mass index is 24.58 kg/m?Marland Kitchen. ? ? ?Consultations: ?Psychiatry ? ?Procedures: ?None ? ? ?Discharge Instructions ? ?Discharge Instructions   ? ? Amb Referral to Palliative Care   Complete by: As directed ?  ? Call MD for:  difficulty breathing, headache or visual disturbances   Complete by: As directed ?  ? Call MD for:  extreme fatigue   Complete by: As directed ?  ? Call MD for:  persistant dizziness or light-headedness   Complete by: As directed ?  ? Call MD for:  persistant nausea and vomiting   Complete by: As directed ?  ? Call MD for:  severe uncontrolled pain   Complete by: As directed ?  ? Call MD for:  temperature >100.4   Complete by: As directed ?  ? Diet - low sodium heart healthy   Complete by: As directed ?  ? Increase activity slowly   Complete by: As directed ?  ? ?  ? ?  ?Medication List  ?  ? ?STOP taking these medications   ? ?clotrimazole-betamethasone cream ?Commonly known as: Lotrisone ?  ?ibuprofen 200 MG tablet ?Commonly known as: ADVIL ?  ?ketoconazole 2 % cream ?Commonly known as: NIZORAL ?  ? ?  ? ?TAKE these medications   ? ?acetaminophen 325 MG tablet ?Commonly known as: TYLENOL ?Take 2 tablets (650 mg total) by mouth every 6 (six) hours as needed for mild pain, moderate pain, fever or headache. ?  ?aspirin 81 MG tablet ?Take 1  tablet (81 mg total) by mouth daily. ?  ?bisacodyl 10 MG suppository ?Commonly known as: DULCOLAX ?Place 1 suppository (10 mg total) rectally daily as needed for moderate constipation. ?  ?cholecalciferol 10 MCG (400 UNIT) Tabs tablet ?Commonly known as: VITAMIN D3 ?Take 1 tablet (400 Units total) by mouth daily. ?  ?cyanocobalamin 1000 MCG tablet ?Take 1 tablet (1,000 mcg total) by mouth daily. ?  ?OLANZapine 2.5 MG tablet ?Commonly known as: ZYPREXA ?Take 1 tablet (2.5 mg total) by mouth at bedtime. ?  ?polyethylene glycol 17 g packet ?Commonly known as: MIRALAX / GLYCOLAX ?Take 17 g by mouth daily as needed for mild constipation. ?  ?Synthroid 112 MCG tablet ?Generic drug: levothyroxine ?Take 112 mcg by mouth daily. ?  ? ?  ? ?Allergies  ?Allergen Reactions  ? Codeine Camsylate [Codeine] Other (See  Comments)  ?  Caused severe headaches, dizziness and confusion  ? Zithromax [Azithromycin] Nausea And Vomiting  ? Erythromycin Base   ? ? ? ? ?Procedures/Studies: ?DG Lumbar Spine Complete ? ?Result Date: 02/08/2022 ?CLINICAL DATA:  Fall EXAM: LUMBAR SPINE - COMPLETE 4+ VIEW; PELVIS - 1 VIEW COMPARISON:  Lumbar spine radiograph dated December 24, 2020 FINDINGS: Lumbar spine: No evidence of acute lumbar spine fracture. Moderate moderate compression deformity of the L2 vertebral body, unchanged when compared with prior exam. Moderate degenerative disc disease seen at L5-S1, unchanged. Grade 1 anterolisthesis of L3-L4. Moderate multilevel facet arthropathy. Vascular calcifications. Pelvis: Prior total left hip replacement. Osseous irregularity of the pubic symphysis, likely due to remote prior fractures. No evidence of acute fracture or dislocation. IMPRESSION: No acute osseous abnormality. Electronically Signed   By: Yetta Glassman M.D.   On: 02/08/2022 16:45  ? ?DG Pelvis 1-2 Views ? ?Result Date: 02/08/2022 ?CLINICAL DATA:  Fall EXAM: LUMBAR SPINE - COMPLETE 4+ VIEW; PELVIS - 1 VIEW COMPARISON:  Lumbar spine radiograph  dated December 24, 2020 FINDINGS: Lumbar spine: No evidence of acute lumbar spine fracture. Moderate moderate compression deformity of the L2 vertebral body, unchanged when compared with prior exam. Moderate degene

## 2022-02-14 NOTE — Care Management Important Message (Signed)
Important Message ? ?Patient Details IM Letter given to the Patient. ?Name: Jackie [name] ?MRN: [id number] ?Date of Birth: 10-18-1932 ? ? ?Medicare Important Message Given:  Yes ? ? ? ? ?Kerin Salen ?02/14/2022, 2:53 PM ?

## 2022-02-14 NOTE — Plan of Care (Signed)
  Problem: Coping: Goal: Level of anxiety will decrease Outcome: Progressing   Problem: Pain Managment: Goal: General experience of comfort will improve Outcome: Progressing   

## 2022-02-15 LAB — CREATININE, SERUM
Creatinine, Ser: 0.68 mg/dL (ref 0.44–1.00)
GFR, Estimated: >60 mL/min (ref >60)

## 2022-02-15 NOTE — Hospital Course (Addendum)
88 year old female with medical history significant for fracture, depression, insomnia, esophageal diverticulum, anemia, BPPV, diastolic dysfunction, hypertension, hyperlipidemia, peripheral vascular disease, post thyroidectomy hypothyroidism, presented to the ED after a fall at home.  She reportedly was down for 20 hours.  Admitted for rhabdomyolysis and E. coli UTI. ? ?

## 2022-02-15 NOTE — Plan of Care (Signed)
?  Problem: Education: ?Goal: Knowledge of General Education information will improve ?Description: Including pain rating scale, medication(s)/side effects and non-pharmacologic comfort measures ?Outcome: Adequate for Discharge ?  ?Problem: Health Behavior/Discharge Planning: ?Goal: Ability to manage health-related needs will improve ?Outcome: Adequate for Discharge ?  ?Problem: Clinical Measurements: ?Goal: Ability to maintain clinical measurements within normal limits will improve ?Outcome: Adequate for Discharge ?Goal: Will remain free from infection ?Outcome: Adequate for Discharge ?Goal: Diagnostic test results will improve ?Outcome: Adequate for Discharge ?Goal: Respiratory complications will improve ?Outcome: Adequate for Discharge ?Goal: Cardiovascular complication will be avoided ?Outcome: Adequate for Discharge ?  ?Problem: Activity: ?Goal: Risk for activity intolerance will decrease ?Outcome: Adequate for Discharge ?  ?Problem: Nutrition: ?Goal: Adequate nutrition will be maintained ?Outcome: Adequate for Discharge ?  ?Problem: Coping: ?Goal: Level of anxiety will decrease ?Outcome: Adequate for Discharge ?  ?Problem: Elimination: ?Goal: Will not experience complications related to bowel motility ?Outcome: Adequate for Discharge ?Goal: Will not experience complications related to urinary retention ?Outcome: Adequate for Discharge ?  ?Problem: Pain Managment: ?Goal: General experience of comfort will improve ?Outcome: Adequate for Discharge ?  ?Problem: Safety: ?Goal: Ability to remain free from injury will improve ?Outcome: Adequate for Discharge ?  ?Problem: Skin Integrity: ?Goal: Risk for impaired skin integrity will decrease ?Outcome: Adequate for Discharge ?  ?Problem: Acute Rehab PT Goals(only PT should resolve) ?Goal: Pt Will Go Supine/Side To Sit ?Outcome: Adequate for Discharge ?Goal: Pt Will Go Sit To Supine/Side ?Outcome: Adequate for Discharge ?Goal: Patient Will Transfer Sit To/From  Stand ?Outcome: Adequate for Discharge ?Goal: Pt Will Ambulate ?Outcome: Adequate for Discharge ?  ?Problem: Acute Rehab OT Goals (only OT should resolve) ?Goal: Pt. Will Perform Lower Body Dressing ?Outcome: Adequate for Discharge ?Goal: Pt. Will Transfer To Toilet ?Outcome: Adequate for Discharge ?Goal: Pt. Will Perform Toileting-Clothing Manipulation ?Outcome: Adequate for Discharge ?  ?Problem: Safety: ?Goal: Non-violent Restraint(s) ?Outcome: Adequate for Discharge ?  ?

## 2022-02-15 NOTE — Progress Notes (Signed)
Occupational Therapy Treatment ?Patient Details ?Name: Jackie [name] ?MRN: [id number] ?DOB: 03-07-1932 ?Today's Date: 02/15/2022 ? ? ?History of present illness 88 yo female admitted with rhabdomyolysis, increased troponin after a fall at home.Hx of falls, L hip fx s/p THA 2015, pelvic fracture, T spine fx. ?  ?OT comments ? Patient was noted to have increased posterior leaning without shoes on at commode at start of session. Patient was donned shoes with max A with slight improvement in standing balance with increased ear of falling today. Patient was mod A for standing balance to transfer back to recliner in room with RW. Patient would continue to benefit from skilled OT services at this time while admitted and after d/c to address noted deficits in order to improve overall safety and independence in ADLs.  ?  ? ?Recommendations for follow up therapy are one component of a multi-disciplinary discharge planning process, led by the attending physician.  Recommendations may be updated based on patient status, additional functional criteria and insurance authorization. ?   ?Follow Up Recommendations ? Skilled nursing-short term rehab (<3 hours/day)  ?  ?Assistance Recommended at Discharge Frequent or constant Supervision/Assistance  ?Patient can return home with the following ? A lot of help with walking and/or transfers;A lot of help with bathing/dressing/bathroom;Assistance with cooking/housework;Direct supervision/assist for financial management;Assist for transportation;Help with stairs or ramp for entrance;Direct supervision/assist for medications management ?  ?Equipment Recommendations ? Other (comment) (defer to next venue)  ?  ?Recommendations for Other Services   ? ?  ?Precautions / Restrictions Precautions ?Precautions: Fall ?Restrictions ?Weight Bearing Restrictions: No  ? ? ?  ? ?Mobility Bed Mobility ?  ?  ?  ?  ?  ?  ?  ?General bed mobility comments: in bathroom at start of session ?  ? ?Transfers ?  ?   ?  ?  ?  ?  ?  ?  ?  ?  ?  ?  ?Balance Overall balance assessment: Needs assistance, History of Falls ?Sitting-balance support: Feet supported, Bilateral upper extremity supported ?Sitting balance-Leahy Scale: Fair ?  ?  ?Standing balance support: Bilateral upper extremity supported, During functional activity, Reliant on assistive device for balance ?Standing balance-Leahy Scale: Poor ?Standing balance comment: strong posterior leaning during session unable to correct. ?  ?  ?  ?  ?  ?  ?  ?  ?  ?  ?  ?   ? ?ADL either performed or assessed with clinical judgement  ? ?ADL Overall ADL's : Needs assistance/impaired ?  ?  ?  ?  ?  ?  ?  ?  ?  ?  ?Lower Body Dressing: Maximal assistance ?Lower Body Dressing Details (indicate cue type and reason): max A to don shoes with patient needing shoes to improve standing balance. ?Toilet Transfer: Moderate assistance;Regular Toilet;Rolling walker (2 wheels) ?Toilet Transfer Details (indicate cue type and reason): patient was noted to have strong posterior leaning noted with education and shoes donned to reduce leaning. patient was noted to continue posterior leaning during functional moblity all the way to recliner on this date. ?Toileting- Clothing Manipulation and Hygiene: Maximal assistance;Sit to/from stand ?Toileting - Clothing Manipulation Details (indicate cue type and reason): strong posterior leaning. ?  ?  ?  ?  ?  ? ?Extremity/Trunk Assessment   ?  ?  ?  ?  ?  ? ?Vision   ?  ?  ?Perception   ?  ?Praxis   ?  ? ?Cognition Arousal/Alertness: Awake/alert ?Behavior  During Therapy: [facility] for tasks assessed/performed ?Overall Cognitive Status: Within Functional Limits for tasks assessed ?  ?  ?  ?  ?  ?  ?  ?  ?  ?  ?  ?  ?  ?  ?  ?  ?General Comments: patient was noted to have some confusion with increased fear of falling on this date. ?  ?  ?   ?Exercises   ? ?  ?Shoulder Instructions   ? ? ?  ?General Comments    ? ? ?Pertinent Vitals/ Pain       Pain Assessment ?Pain  Assessment: Faces ?Faces Pain Scale: Hurts little more ?Pain Location: cervical region ?Pain Descriptors / Indicators: Discomfort, Sore, Aching ?Pain Intervention(s): Limited activity within patient's tolerance, Monitored during session ? ?Home Living   ?  ?  ?  ?  ?  ?  ?  ?  ?  ?  ?  ?  ?  ?  ?  ?  ?  ?  ? ?  ?Prior Functioning/Environment    ?  ?  ?  ?   ? ?Frequency ? Min 2X/week  ? ? ? ? ?  ?Progress Toward Goals ? ?OT Goals(current goals can now be found in the care plan section) ? Progress towards OT goals: Not progressing toward goals - comment (noted to have increased posterior leaning since last session) ? ?   ?Plan Discharge plan remains appropriate   ? ?Co-evaluation ? ? ?   ?  ?  ?  ?  ? ?  ?AM-PAC OT "6 Clicks" Daily Activity     ?Outcome Measure ? ? Help from another person eating meals?: A Little ?Help from another person taking care of personal grooming?: A Little ?Help from another person toileting, which includes using toliet, bedpan, or urinal?: A Little ?Help from another person bathing (including washing, rinsing, drying)?: A Lot ?Help from another person to put on and taking off regular upper body clothing?: A Little ?Help from another person to put on and taking off regular lower body clothing?: A Lot ?6 Click Score: 16 ? ?  ?End of Session Equipment Utilized During Treatment: Rolling walker (2 wheels);Gait belt ? ?OT Visit Diagnosis: Unsteadiness on feet (R26.81);Other abnormalities of gait and mobility (R26.89);History of falling (Z91.81) ?  ?Activity Tolerance Patient tolerated treatment well ?  ?Patient Left in chair;with call bell/phone within reach;with nursing/sitter in room;with chair alarm set ?  ?Nurse Communication Other (comment) (ok to participate, updated on participation in session) ?  ? ?   ? ?Time: 0973-5329 ?OT Time Calculation (min): 20 min ? ?Charges: OT General Charges ?$OT Visit: 1 Visit ?OT Treatments ?$Self Care/Home Management : 8-22 mins ? ?Julaine Zimny OTR/L,  MS ?Acute Rehabilitation Department ?Office# [phone] ?Pager# [phone] ? ? ?Feliz Beam Deone Omahoney ?02/15/2022, 11:10 AM ?

## 2022-02-15 NOTE — Plan of Care (Cosign Needed)
Completed during shift assessment. ?

## 2022-02-15 NOTE — TOC Transition Note (Signed)
Transition of Care (TOC) - CM/SW Discharge Note ? ? ?Patient Details  ?Name: Jackie [name] ?MRN: [id number] ?Date of Birth: [birth year] ? ?Transition of Care (TOC) CM/SW Contact:  ?Manson Luckadoo, LCSW ?Phone Number: ?02/15/2022, 10:50 AM ? ? ?Clinical Narrative:    ?Have received insurance authorization for SNF and pt medically ready for dc today to [facility].  Pt and family aware and agreeable.  PTAR called at 10:50am.  RN to call report to [phone].  No further TOC needs. ? ? ?Final next level of care: [place] ?Barriers to Discharge: Barriers Resolved ? ? ?Patient Goals and CMS Choice ?  ?  ?  ? ?Discharge Placement ?  ?           ?[facility] at[facility] ?Patient to be transferred to facility by: PTAR ?Name of family member notified: daughter ?Patient and family notified of of transfer: 02/15/22 ? ?Discharge Plan and Services ?  ?  ?           ?DME Arranged: N/A ?DME Agency: NA ?  ?  ?  ?  ?  ?  ?  ?  ? ?Social Determinants of Health (SDOH) Interventions ?  ? ? ?Readmission Risk Interventions ?No flowsheet data found. ? ? ? ? ?

## 2022-02-15 NOTE — Progress Notes (Signed)
Progress Note ? ?Patient's DC summary had been completed in preparation for DC to SNF today.  She had an SNF bed but insurance was pending. ? ?Patient seen this morning.  Son at bedside who was on video meeting for his work.  Patient denies complaints.  She is still wondering how she fell.  This was an unwitnessed fall.  No pain reported.  Had a BM this morning.  As per RN, no acute issues and ambulated the hall yesterday. ? ?Patient sitting up comfortably in bed and has just finished eating almost 100% of her breakfast. ? ?Stable vital signs and physical exam ?Appears to be in good spirits. ? ?Serum creatinine 0.68 ? ? ?Assessment and plan: ? ?Unwitnessed fall with rhabdomyolysis ?E. coli UTI, treated ?Elevated troponin, suspected due to rhabdomyolysis and may be demand ischemia: Outpatient cardiac evaluation as deemed necessary. ?Rest as per DC summary from yesterday which I have updated today. ?Discharging to SNF for STR today. ? ?Updated son at bedside. ? ?Vernell Leep, MD,  ?FACP, [facility], [facility], [facility] (Care Management Physician Certified) ?[facility] Advisor ?[place] ? ?To contact the attending provider between 7A-7P or the covering provider during after hours 7P-7A, please log into the web site [email or web address] and access using universal [place] password for that web site. If you do not have the password, please call the hospital operator. ?

## 2022-02-21 ENCOUNTER — Ambulatory Visit: Payer: Medicare Other | Admitting: Podiatrist

## 2022-02-23 ENCOUNTER — Non-Acute Institutional Stay: Payer: Medicare Other | Admitting: Internal Medicine

## 2022-02-23 ENCOUNTER — Other Ambulatory Visit: Payer: Self-pay

## 2022-02-23 ENCOUNTER — Encounter: Payer: Self-pay | Admitting: Internal Medicine

## 2022-02-23 VITALS — BP 146/74 | HR 68 | Temp 97.2°F | Resp 16

## 2022-02-23 DIAGNOSIS — Z515 Encounter for palliative care: Secondary | ICD-10-CM

## 2022-02-23 DIAGNOSIS — W19XXXS Unspecified fall, sequela: Secondary | ICD-10-CM

## 2022-02-23 DIAGNOSIS — F03A2 Unspecified dementia, mild, with psychotic disturbance: Secondary | ICD-10-CM

## 2022-02-23 NOTE — Progress Notes (Signed)
[facility] Palliative Care Consult Note Telephone: [phone]  Fax: [phone]   Date of encounter: 02/23/22 8:31 AM PATIENT NAME: Jackie [name] [address] [place] [address]   [phone] (home)  DOB: Aug 21, 1932 MRN: [id number] PRIMARY CARE PROVIDER:    [name], MD,  [place] [address] [phone]  REFERRING PROVIDER:   Dr. Lilli [name] covering [name]  RESPONSIBLE PARTY:    Contact Information     Name Relation Home Work Mobile   Jackie [name] Son [phone]     Jackie [name] Daughter [phone]  [phone]   Jackie [name] Relative [phone]  [phone]        I met face to face with patient and family in [facility] facility. Palliative Care was asked to follow this patient by consultation request of Dr. Rosey [name] to address advance care planning and complex medical decision making. This is the initial visit.                                     ASSESSMENT AND PLAN / RECOMMENDATIONS:   Advance Care Planning/Goals of Care: Goals include to maximize quality of life and symptom management. Patient/health care surrogate gave his/her permission to discuss.Our advance care planning conversation included a discussion about:    Identification  of a healthcare agent--her daughter in [place] is primary decision maker per facility chart Decision not to resuscitate [name] to de-escalate disease focused treatments due to poor prognosis. CODE STATUS: Has DNR Pt's personal goal is to return home; however, upon talking with therapy staff, overall goal is to relocate to [place] [facility] where her daughter lives--possibly to a memory care [place] facility there; ideally should have MOST form also completed.  Symptom Management/Plan: 1. Mild dementia with psychotic disturbance, unspecified dementia type -pt still with some degree of waxing and waning mental status; seems she did have some baseline  dementia prior to her traumatic fall and rhabdo/ARF admission -chart also indicates some depression -plan is for completion of rehab here and then move to [place] facility memory care [place] near her daughter  2. Fall, sequela -still a bit sore, but making good progress with therapy, walked 250 feet with rehab with her walker, has some challenges with safety awareness due to her cognition and getting one person assist with bathing, dressing, able to ride on stationary bike for 15 mins also -cont PT until no longer making progress, then will need aide services/nursing to encourage ongoing exercise program to prevent decline  3. Palliative care by specialist -pt clear about DNR status; would benefit from MOST completion for future planning, daughter in [place] is HCPOA and plan for future already established by facility--staff were surprised we had been asked to see pt; I do recommend she be followed by a palliative care team at her final destination to help with education about disease progression and help prevent unnecessary hospital visits that might negatively impact her quality of life  in view of her dementia  Follow up Palliative Care Visit: Palliative care will continue to follow for complex medical decision making, advance care planning, and clarification of goals.   This visit was coded based on medical decision making (MDM).  PPS: 40%  HOSPICE ELIGIBILITY/DIAGNOSIS: TBD  Chief Complaint: Initial palliative care consult  HISTORY OF PRESENT ILLNESS:  Jackie [name] is a 88 y.o. year old female who had lived alone in her home in [place]  prior to a recent fall 3/14 with prolonged time on the floor--had rhabdo, demand ischemia, and was found to have UTI and delirium.  She has a h/o chronic diastolic heart failure, hypothyroidism, cervical DDD, and prior left hip fx.  She was treated for her infection.  She continued to have confusion and it remained unclear whether she had baseline  dementia, delirium, depression [name] a combination.  She was started on zyprexa during her admission.    She is at [facility] for rehab.  Her insurance has said that she will complete rehab on 4/4.  Pt thinks she will go home, but it sounds therapy staff informed me that the plan is for her to move to [place] where her daughter lives.    History obtained from review of EMR, discussion with primary team, and interview with family, facility staff/caregiver and/[name] Jackie [name].  I reviewed available labs, medications, imaging, studies and related documents from the EMR.  Records reviewed and summarized above.   ROS General: NAD EYES: denies vision changes, glasses ENMT: denies dysphagia Cardiovascular: denies chest pain, denies DOE Pulmonary: denies cough, denies increased SOB Abdomen: endorses variable appetite, denies constipation, endorses continence of bowel GU: denies dysuria, endorses continence of urine MSK:  denies increased weakness,  no falls reported Skin: denies rashes [name] wounds, to be wearing compression stockings Neurological: denies pain, denies insomnia Psych: Endorses sad mood about not being home and wanting to see kids and grandkids who she thinks are in the facility looking for her Heme/lymph/immuno: denies bruises, abnormal bleeding  Physical Exam: Current and past weights:  130 on 3/14 with BMI 24 Constitutional: NAD General: frail appearing, thin EYES: anicteric sclera, lids intact, no discharge, glasses ENMT: intact hearing, oral mucous membranes moist, dentition intact CV: S1S2, RRR, soft, nonpitting edema, with chronic venous stasis changes/hyperpigmentation Pulmonary: LCTA, no increased work of breathing, no cough, room air Abdomen: intake 50-75%, normo-active BS + 4 quadrants, soft and non tender, no ascites GU: deferred MSK: no sarcopenia, moves all extremities, ambulatory with walker Skin: warm and dry, no rashes [name] wounds on visible skin Neuro:  no  generalized weakness,  has cognitive impairment Psych: non-anxious affect, A and O to place, said 1921, knew Jackie [name] is president, but thought her children and grandchildren were looking for her in the facility and thinks she is going back home Hem/lymph/immuno: no widespread bruising at this point on any visible skin  CURRENT PROBLEM LIST:  Patient Active Problem List   Diagnosis Date Noted   Falls 37/54/3606   Diastolic dysfunction 77/01/4034   Peripheral vascular disease ([place]) 02/08/2022   Fall 02/08/2022   Rhabdomyolysis 02/08/2022   Hallucination 02/08/2022   Dermatitis 03/22/2021   Local infection of skin and subcutaneous tissue 09/25/2020   Pain due to onychomycosis of toenails of both feet 03/10/2020   Essential hypertension 07/03/2018   Benign paroxysmal positional vertigo 07/15/2015   Acquired diverticulum of esophagus 06/25/2015   Postoperative hypothyroidism 06/25/2015   Anemia due to chronic blood loss 11/17/2014   CN (constipation) 10/20/2014   Depressed mood 10/20/2014   Insomnia 10/20/2014   Femoral neck fracture ([place]) 10/11/2014   Hip fracture ([place]) 10/11/2014   Hyperlipidemia 12/30/2009   PAST MEDICAL HISTORY:  Active Ambulatory Problems    Diagnosis Date Noted   Femoral neck fracture ([place]) 10/11/2014   Hip fracture ([place]) 10/11/2014   CN (constipation) 10/20/2014   Depressed mood 10/20/2014   Insomnia 10/20/2014   Pain due to onychomycosis of toenails of both feet  03/10/2020   Local infection of skin and subcutaneous tissue 09/25/2020   Dermatitis 03/22/2021   Acquired diverticulum of esophagus 06/25/2015   Anemia due to chronic blood loss 11/17/2014   Benign paroxysmal positional vertigo 19/41/7408   Diastolic dysfunction 14/48/1856   Essential hypertension 07/03/2018   Hyperlipidemia 12/30/2009   Peripheral vascular disease ([place]) 02/08/2022   Postoperative hypothyroidism 06/25/2015   Fall 02/08/2022   Rhabdomyolysis 02/08/2022   Hallucination  02/08/2022   Falls 02/09/2022   Resolved Ambulatory Problems    Diagnosis Date Noted   No Resolved Ambulatory Problems   Past Medical History:  Diagnosis Date   Cancer ([place]) 2000   Osteoporosis    Thyroid disease    Urgency of urination    SOCIAL HX:  Social History   Tobacco Use   Smoking status: Never   Smokeless tobacco: Never  Substance Use Topics   Alcohol use: No   FAMILY HX:  Family History  Problem Relation Age of Onset   Hypertension Mother    Heart disease Father       ALLERGIES:  Allergies  Allergen Reactions   Codeine Camsylate [Codeine] Other (See Comments)    Caused severe headaches, dizziness and confusion   Zithromax [Azithromycin] Nausea And Vomiting   Erythromycin Base      PERTINENT MEDICATIONS:  Outpatient Encounter Medications as of 02/23/2022  Medication Sig   acetaminophen (TYLENOL) 325 MG tablet Take 2 tablets (650 mg total) by mouth every 6 (six) hours as needed for mild pain, moderate pain, fever [name] headache. (Patient not taking: Reported on 02/08/2022)   aspirin 81 MG tablet Take 1 tablet (81 mg total) by mouth daily.   bisacodyl (DULCOLAX) 10 MG suppository Place 1 suppository (10 mg total) rectally daily as needed for moderate constipation. (Patient not taking: Reported on 02/08/2022)   cholecalciferol (VITAMIN D3) 10 MCG (400 UNIT) TABS tablet Take 1 tablet (400 Units total) by mouth daily.   OLANZapine (ZYPREXA) 2.5 MG tablet Take 1 tablet (2.5 mg total) by mouth at bedtime.   polyethylene glycol (MIRALAX / GLYCOLAX) packet Take 17 g by mouth daily as needed for mild constipation. (Patient not taking: Reported on 02/08/2022)   SYNTHROID 112 MCG tablet Take 112 mcg by mouth daily.   vitamin B-12 1000 MCG tablet Take 1 tablet (1,000 mcg total) by mouth daily.   No facility-administered encounter medications on file as of 02/23/2022.   Thank you for the opportunity to participate in the care of Ms. Downie.  The palliative care team will  continue to follow. Please call our office at [phone] if we can be of additional assistance.   Hollace Kinnier, DO   COVID-19 PATIENT SCREENING TOOL Asked and negative response unless otherwise noted:  Have you had symptoms of covid, tested positive [name] been in contact with someone with symptoms/positive test in the past 5-10 days? no

## 2022-02-26 DIAGNOSIS — M545 Low back pain, unspecified: Secondary | ICD-10-CM | POA: Insufficient documentation

## 2022-02-26 MED ORDER — ACETAMINOPHEN 325 MG PO TABS: 650.0000 mg | ORAL_TABLET | Freq: Four times a day (QID) | ORAL | Status: AC | PRN

## 2022-04-08 IMAGING — CT CT HEAD W/O CM
3 of 4 series · 14 of 47 positions shown, 16 images · non-contrast
Comparison: CT head 03/09/2014

CLINICAL DATA: Fall, head trauma



[Series 3: head wo · axial · 0.47mm/px · z∈[-100,+5]mm · 8 of 27 slices shown, 10 images]
[im 3/27  brain]
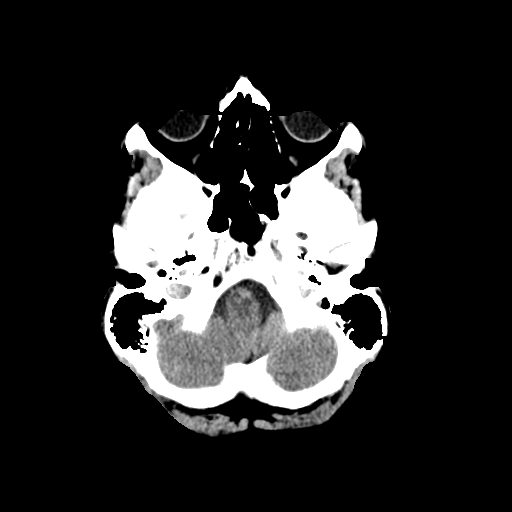
[im 3/27  bone]
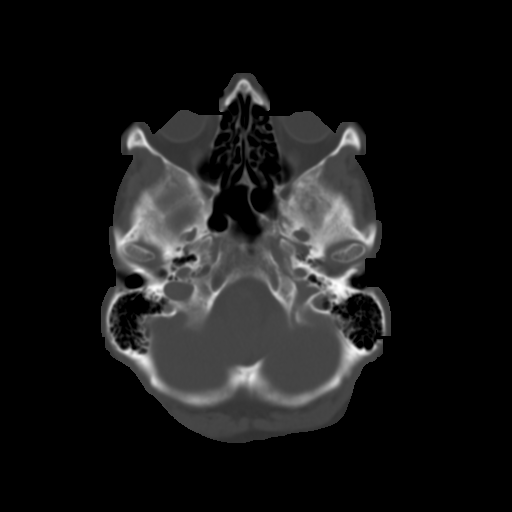
[im 6/27  brain]
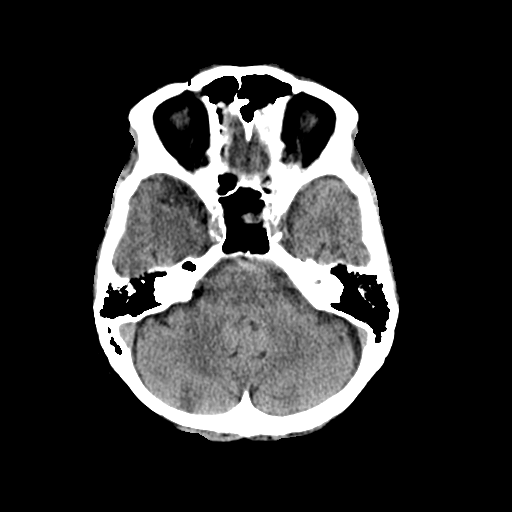
[im 9/27  brain]
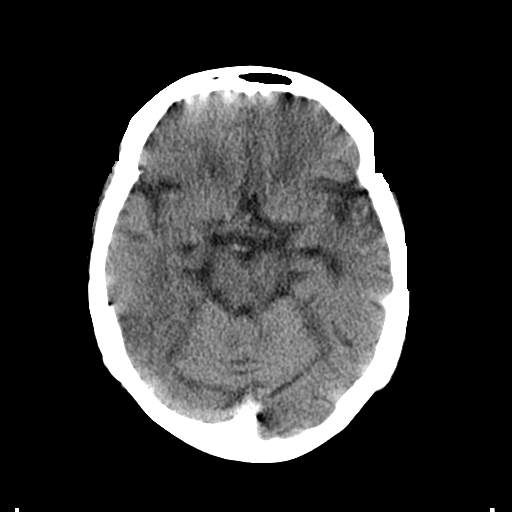
[im 12/27  brain]
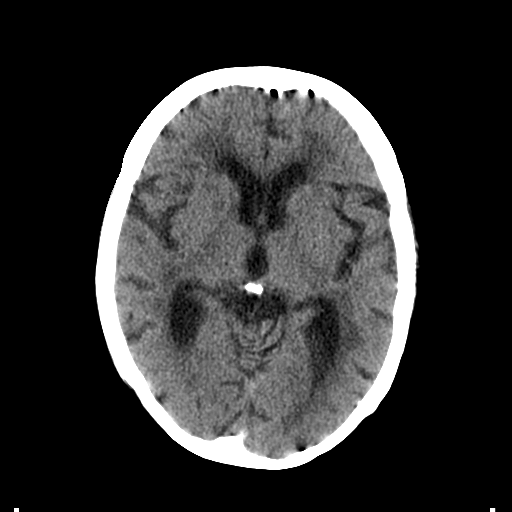
[im 15/27  brain]
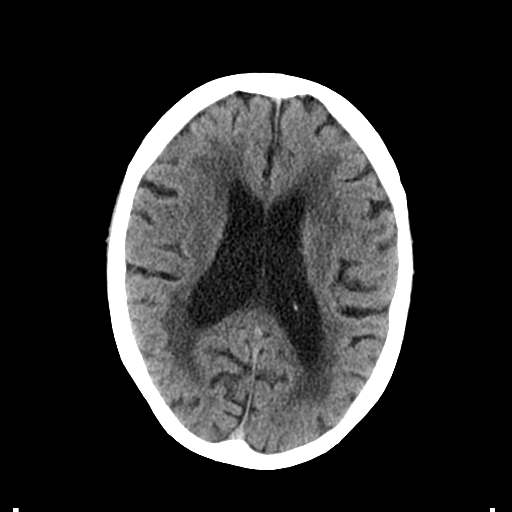
[im 15/27  bone]
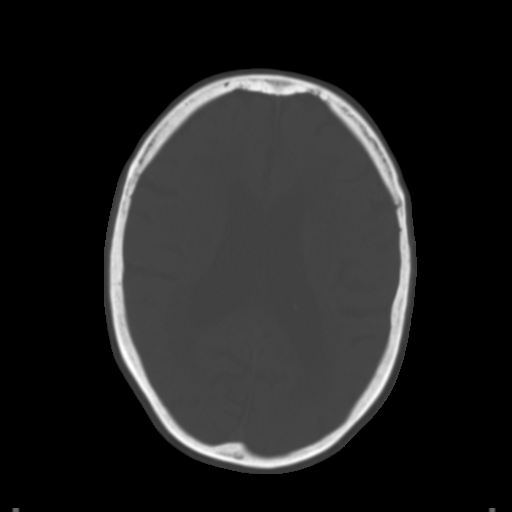
[im 18/27  brain]
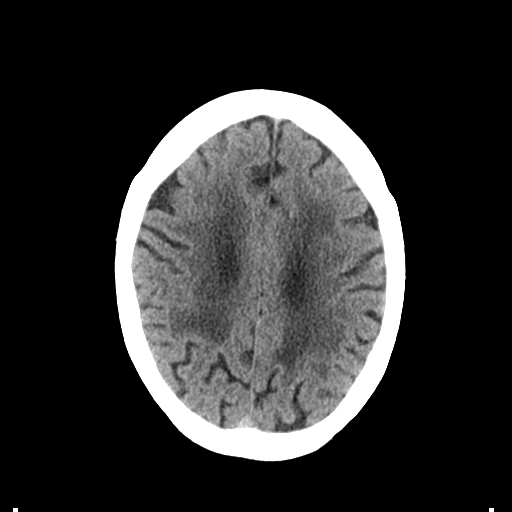
[im 21/27  brain]
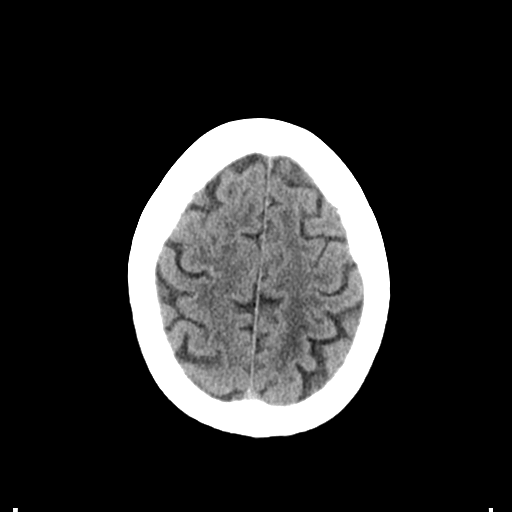
[im 24/27  brain]
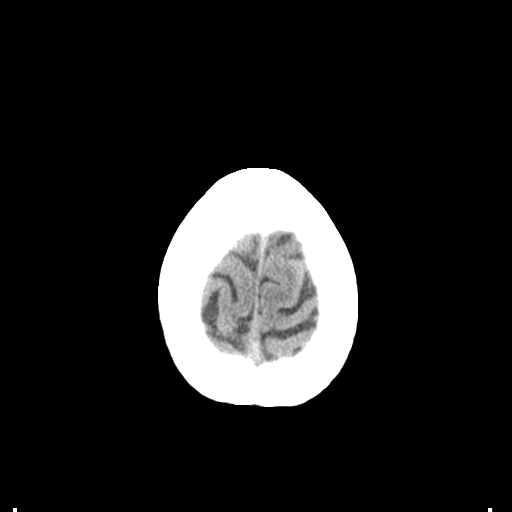

[Series 5: coronal soft tissue · coronal · 0.26mm/px · 3 of 84 slices shown]
[im 28/84  brain]
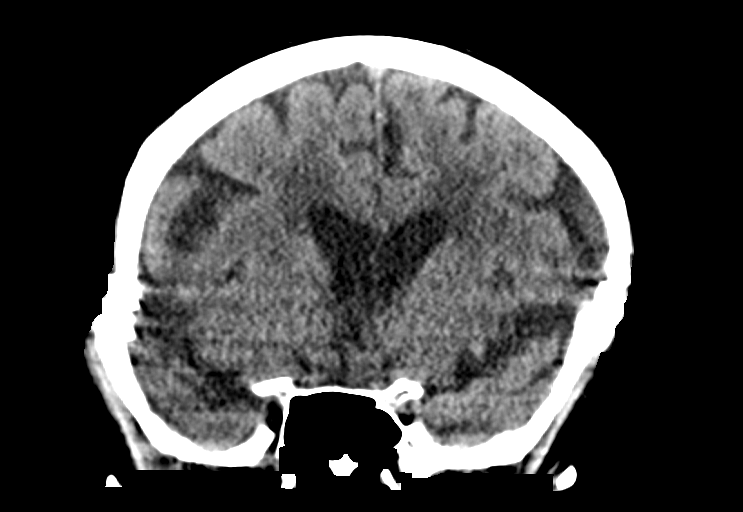
[im 37/84  brain]
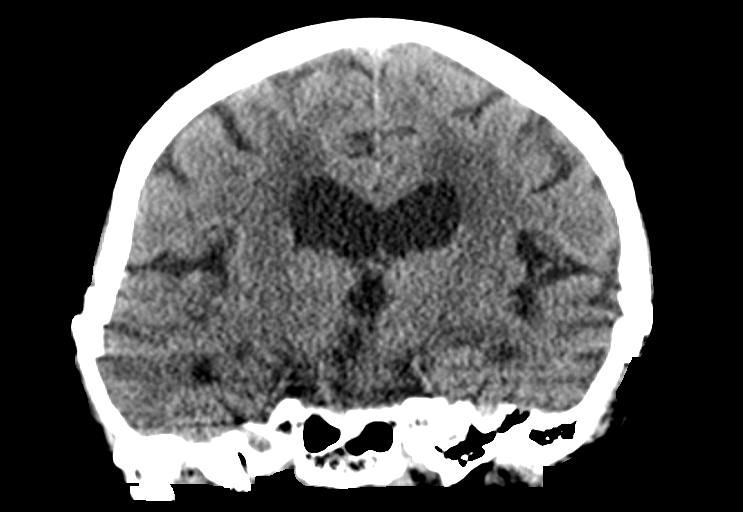
[im 47/84  brain]
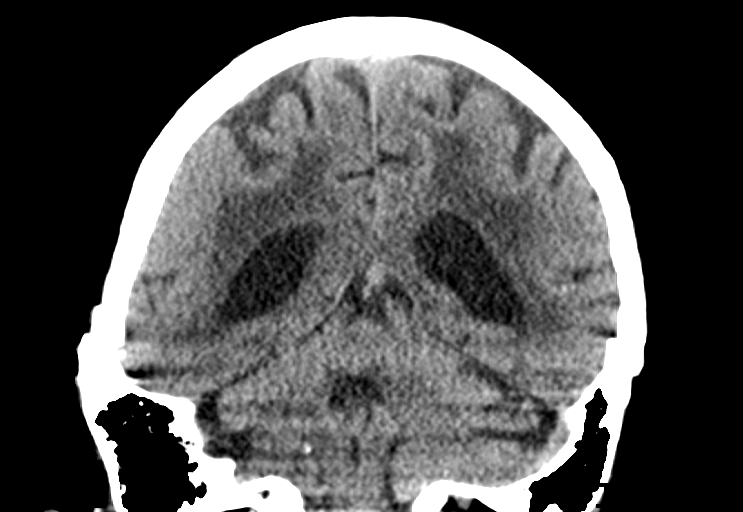

[Series 6: sagittal soft tissue · sagittal · 0.26mm/px · 3 of 64 slices shown]
[im 22/64  brain]
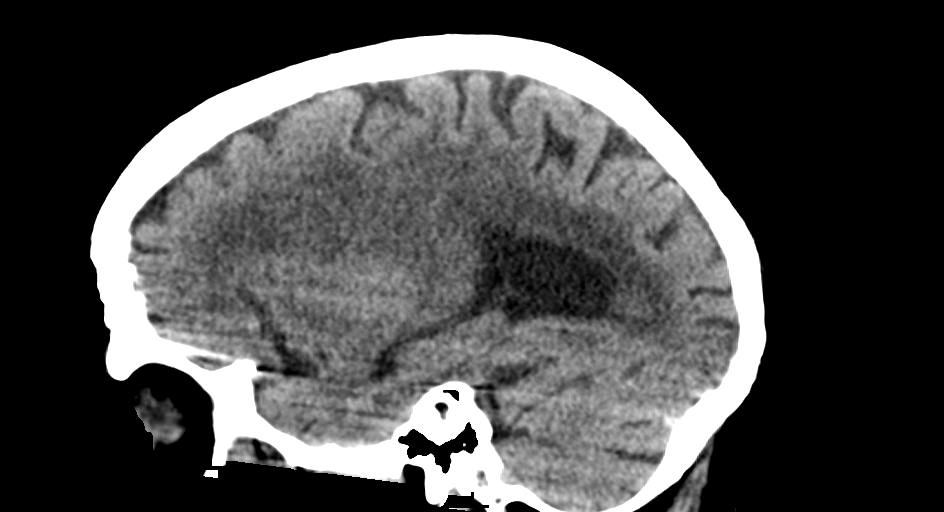
[im 32/64  brain]
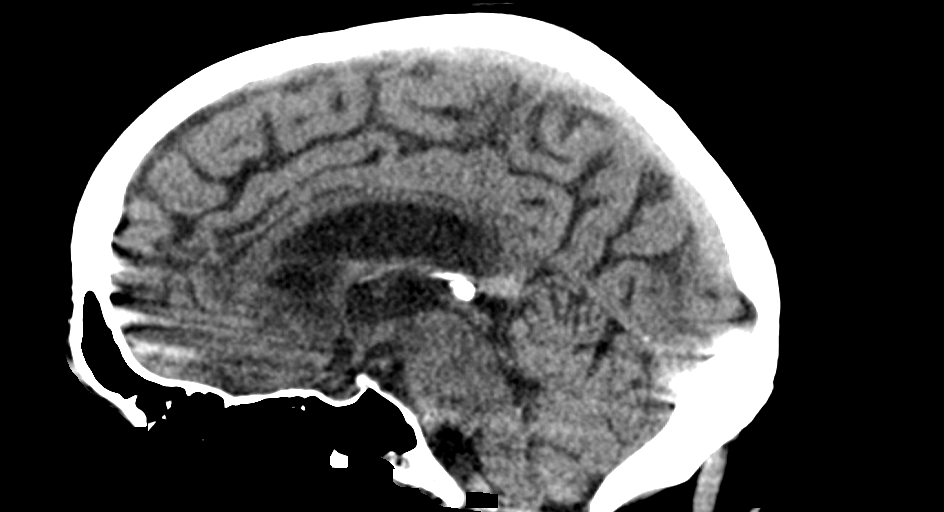
[im 43/64  brain]
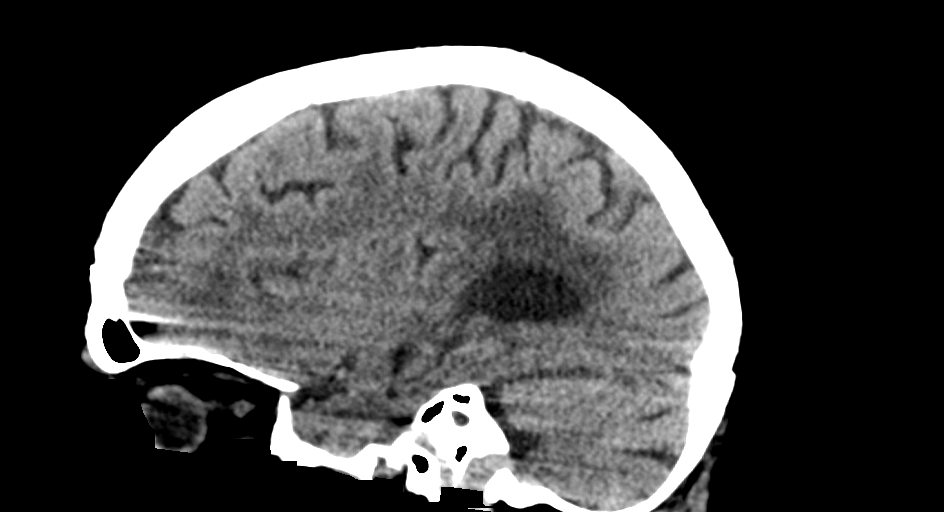

[14 of 47 positions shown; findings below may reference images not displayed]

FINDINGS: Limited study due to motion.

Brain: No acute intracranial hemorrhage, mass effect, or herniation.
No extra-axial fluid collections. No evidence of acute territorial
infarct. No hydrocephalus. Mild cortical volume loss. Patchy
hypodensities throughout the periventricular and subcortical white
matter, likely secondary to chronic microvascular ischemic changes.

Vascular: Calcified plaques in the carotid siphons.

Skull: Normal. Negative for fracture or focal lesion.

Sinuses/Orbits: No acute finding.

Other: None.
IMPRESSION: Chronic changes with no acute intracranial process identified.

## 2022-04-08 IMAGING — CT CT CERVICAL SPINE W/O CM
2 series · 15 of 27 positions shown, 19 images · non-contrast
Comparison: CT cervical spine 03/09/2014

CLINICAL DATA: Fall, trauma



[Series 4: c spine soft · axial · 0.49mm/px · z∈[-216,-102]mm · 10 of 69 slices shown, 13 images]
[im 6/69  soft-tissue]
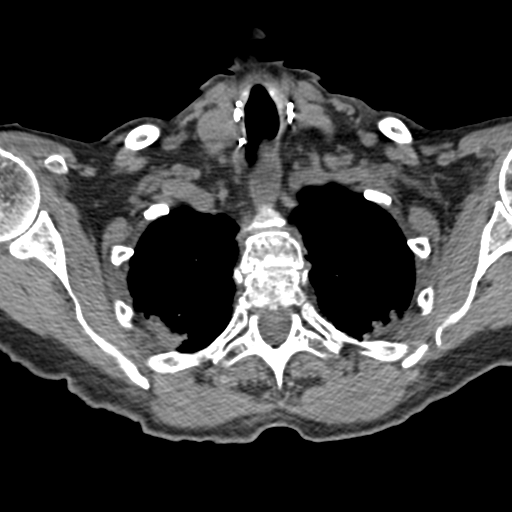
[im 6/69  bone]
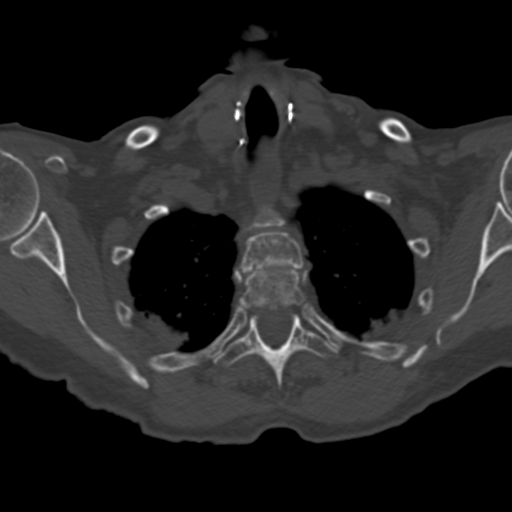
[im 11/69  bone]
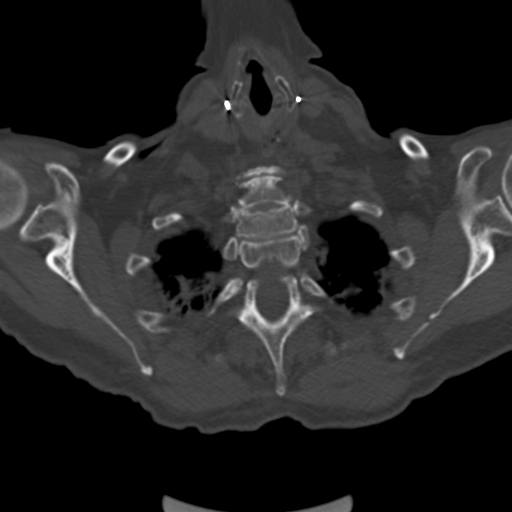
[im 21/69  bone]
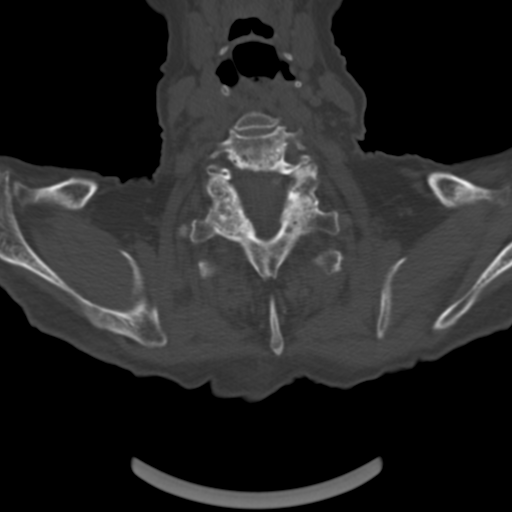
[im 27/69  bone]
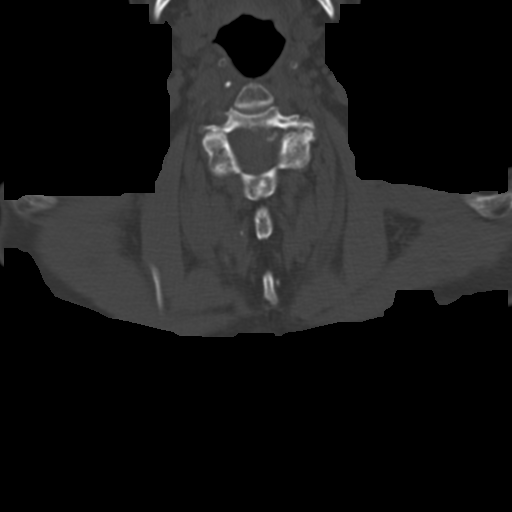
[im 32/69  soft-tissue]
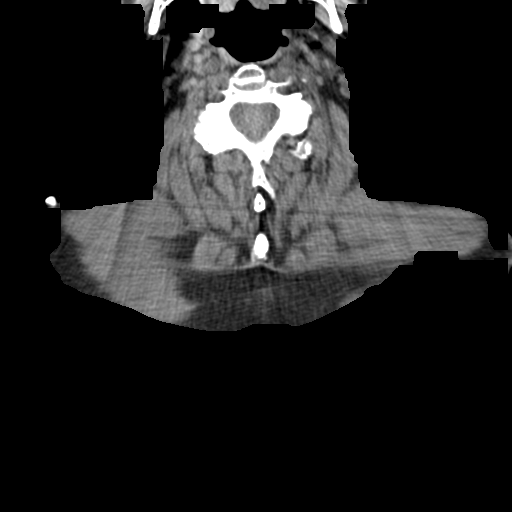
[im 32/69  bone]
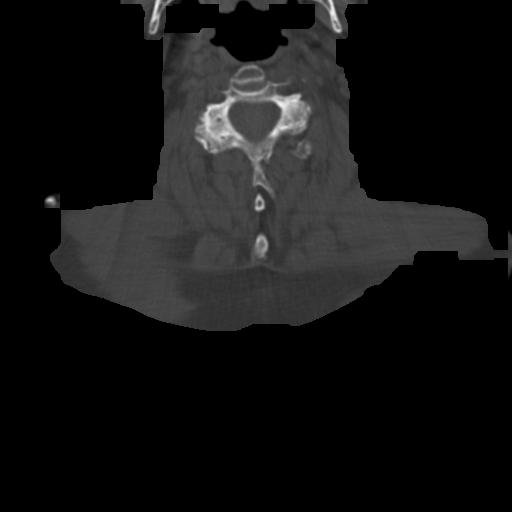
[im 37/69  bone]
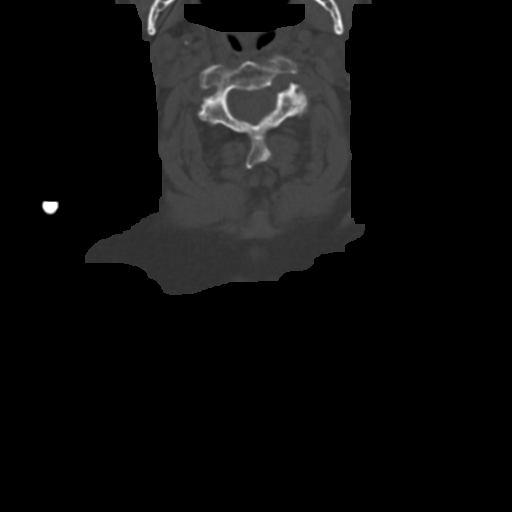
[im 42/69  bone]
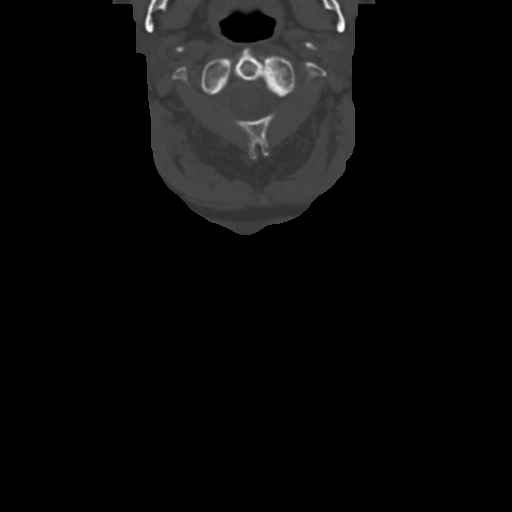
[im 53/69  bone]
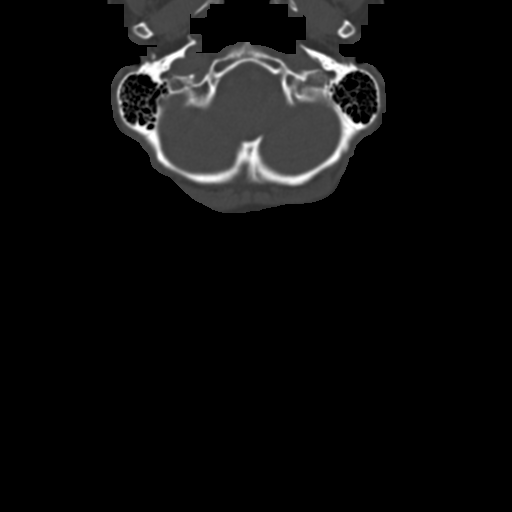
[im 58/69  soft-tissue]
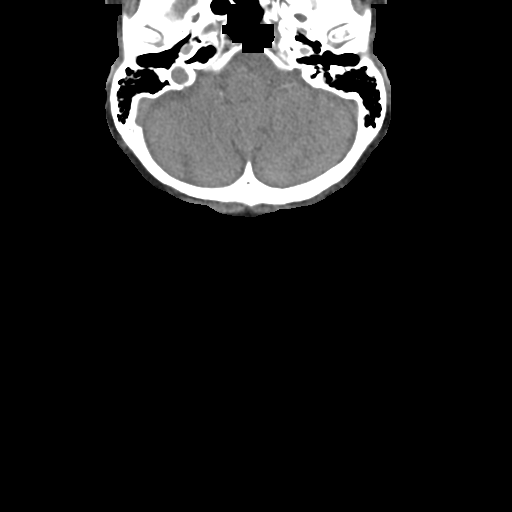
[im 58/69  bone]
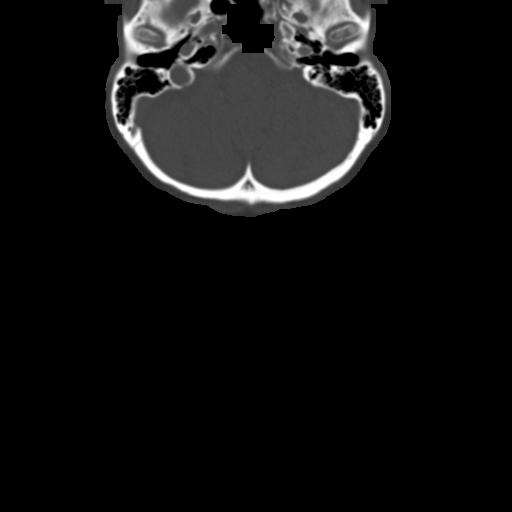
[im 63/69  bone]
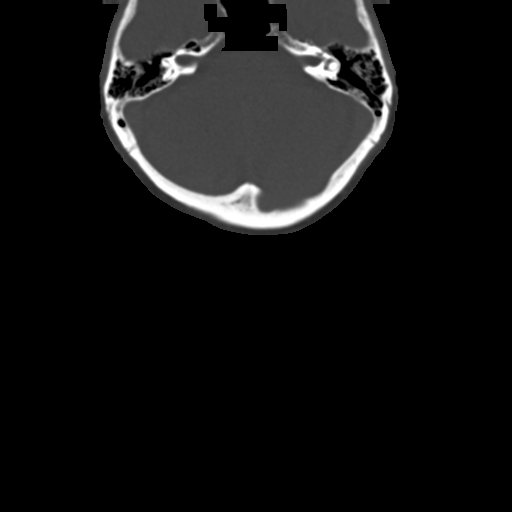

[Series 7: sagittal bone · sagittal · 0.38mm/px · 5 of 97 slices shown, 6 images]
[im 33/97  bone]
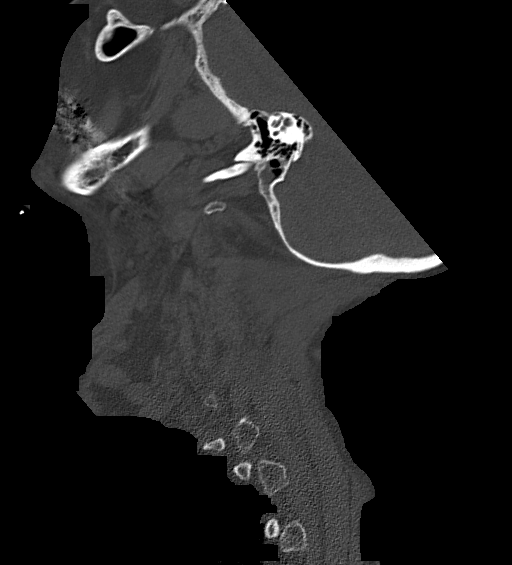
[im 41/97  bone]
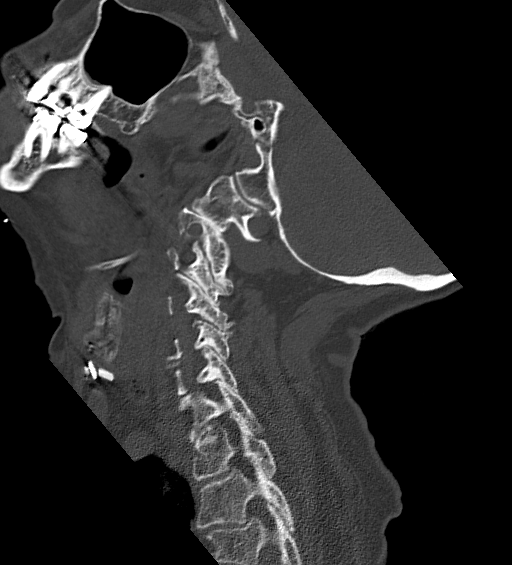
[im 49/97  soft-tissue]
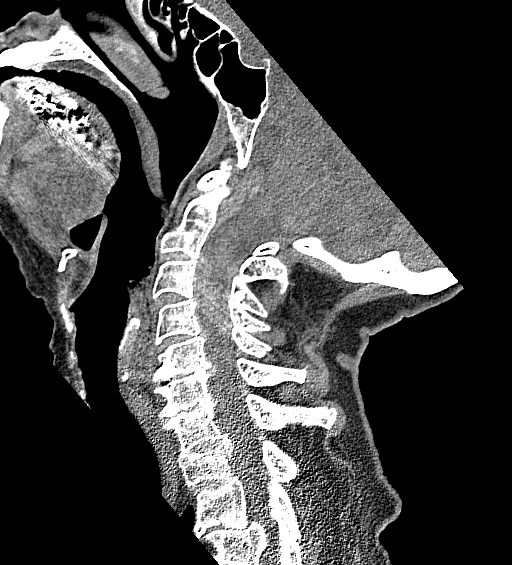
[im 49/97  bone]
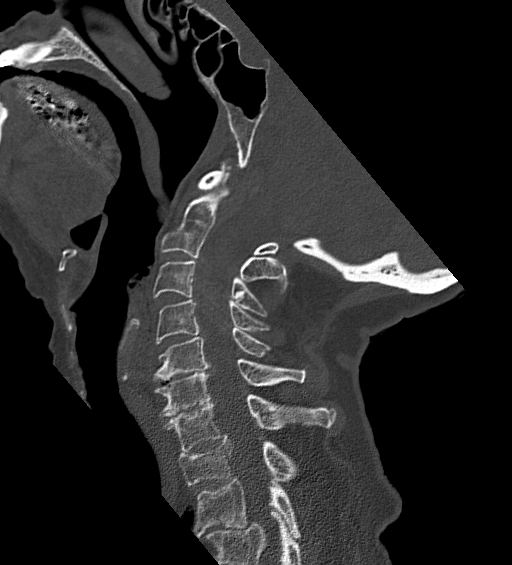
[im 57/97  bone]
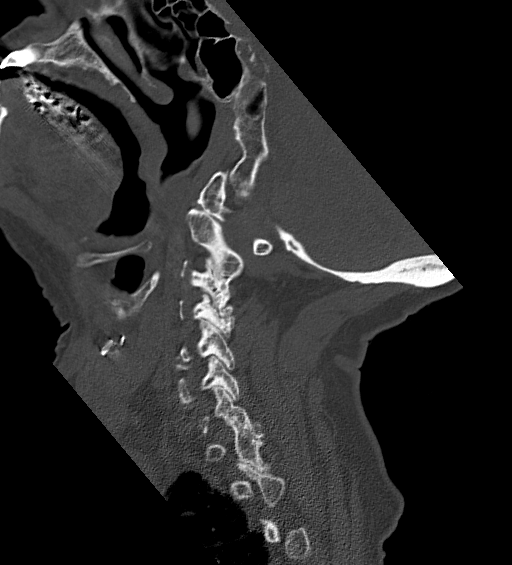
[im 65/97  bone]
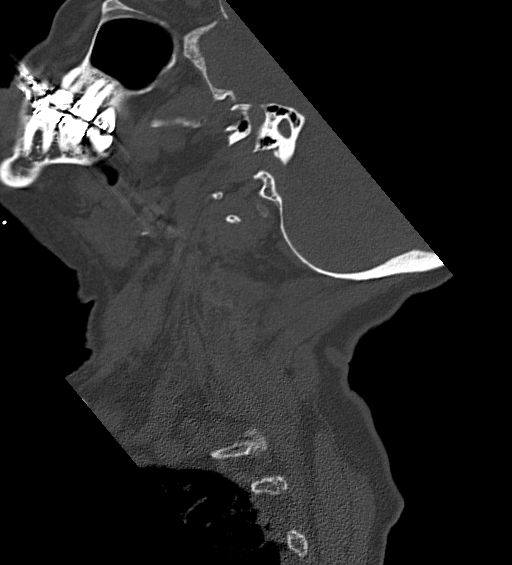

[15 of 27 positions shown; findings below may reference images not displayed]

FINDINGS: Alignment: No acute subluxation or malalignment.

Skull base and vertebrae: No acute fracture. Stable mild height loss
of T1 noted, possibly old compression fracture. No primary bone
lesion or focal pathologic process.

Soft tissues and spinal canal: No prevertebral fluid or swelling. No
visible canal hematoma.

Disc levels: Moderate intervertebral disc space narrowing at C5-C6
and C6-C7 with endplate sclerosis and irregularities. Multilevel
uncovertebral spurring and dorsal endplate osteophytes. Severe facet
arthropathy. Multilevel neural foraminal narrowing bilaterally.

Upper chest: Biapical pleural thickening.

Other: None.
IMPRESSION: No acute fracture or malalignment identified in the cervical spine.
Chronic and degenerative changes.

## 2022-04-12 IMAGING — DX DG CHEST 1V PORT
1 series · 1 of 1 positions shown · non-contrast
Comparison: February 08, 2022

CLINICAL DATA: Cough and weakness.  History of thyroid cancer.

EXAM:
PORTABLE CHEST 1 VIEW

[chest ap]
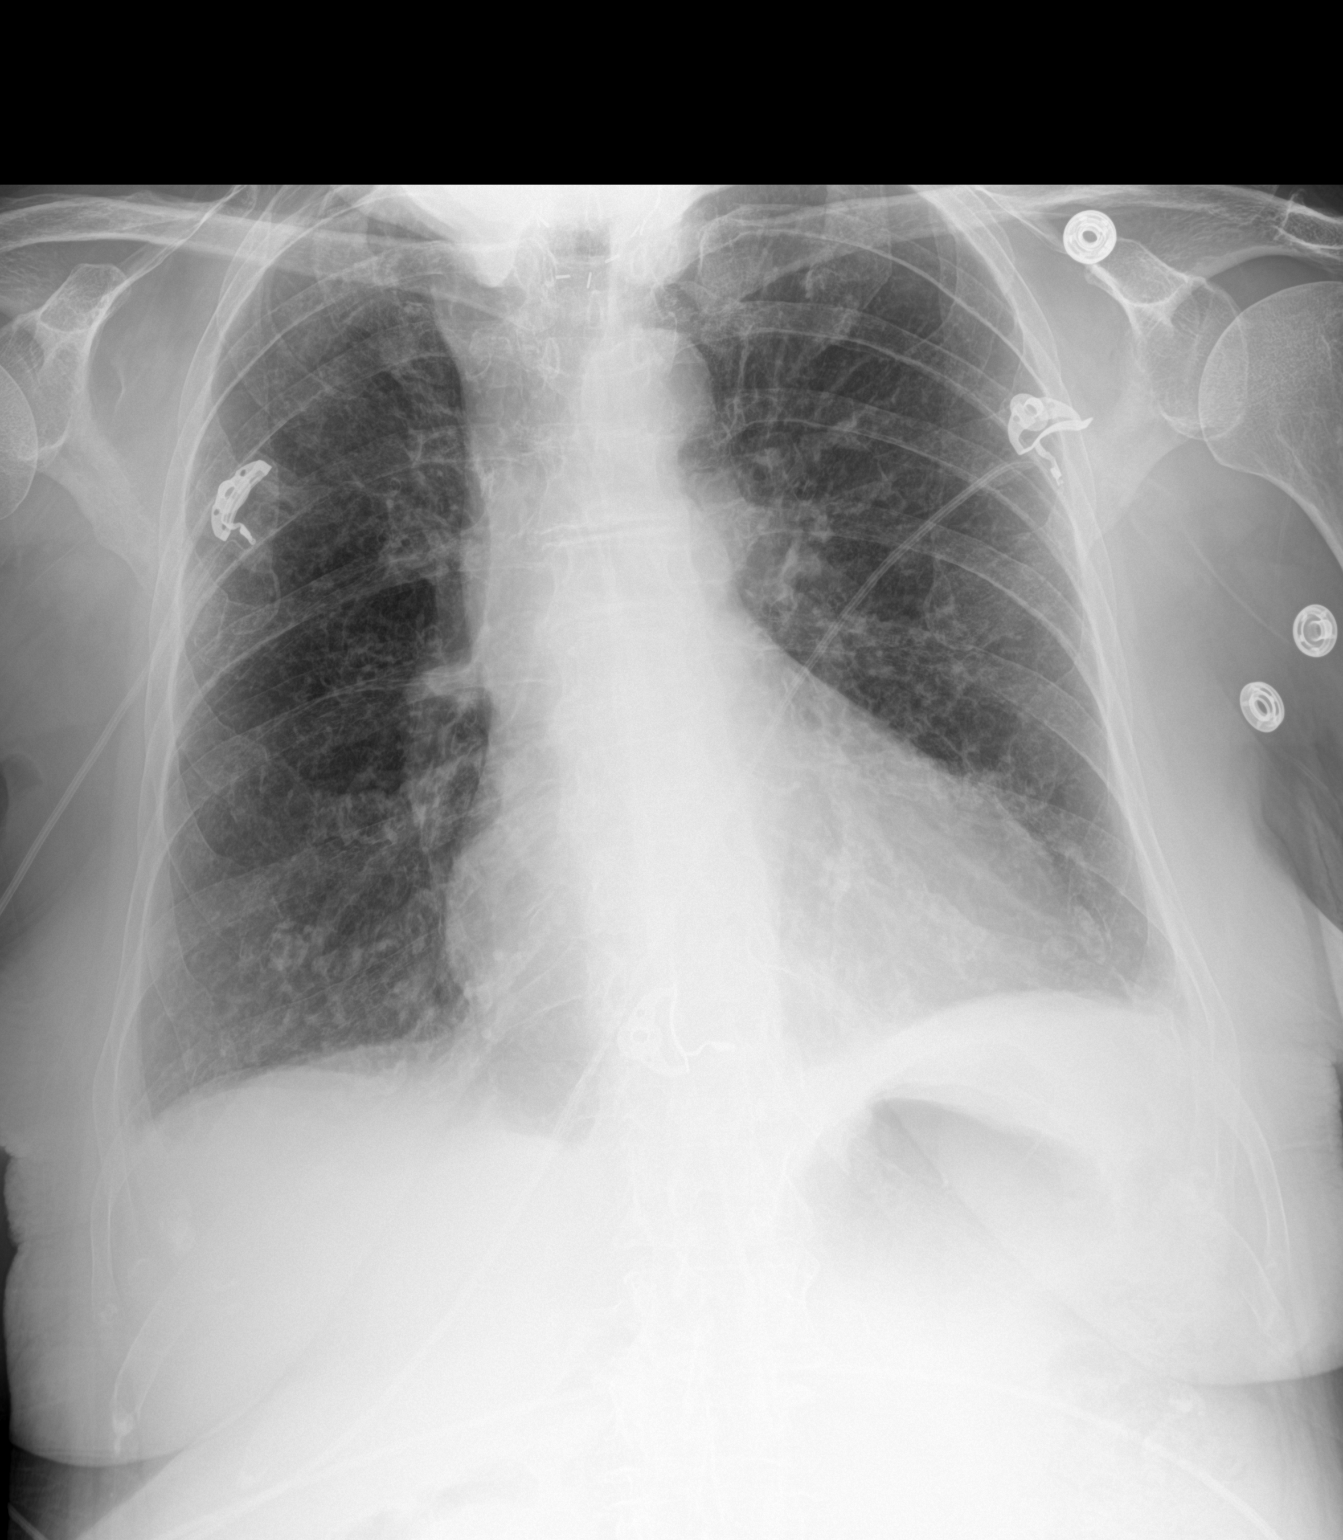

[1 of 1 positions shown; findings below may reference images not displayed]

FINDINGS: Stable cardiomegaly. Probable tiny effusions with underlying
atelectasis. No overt edema. No pneumothorax. The hila and
mediastinum are stable. No focal infiltrates.
IMPRESSION: Probable tiny pleural effusions with atelectasis. No other interval
changes or acute abnormalities.

## 2023-06-29 DEATH — deceased
